# Patient Record
Sex: Female | Born: 1935
Health system: Southern US, Community
[De-identification: ages and names within clinical notes are randomized; demographics above are authoritative.]

## PROBLEM LIST (undated history)

## (undated) DIAGNOSIS — Z95810 Presence of automatic (implantable) cardiac defibrillator: Secondary | ICD-10-CM

## (undated) DIAGNOSIS — I495 Sick sinus syndrome: Secondary | ICD-10-CM

## (undated) DIAGNOSIS — G63 Polyneuropathy in diseases classified elsewhere: Secondary | ICD-10-CM

## (undated) DIAGNOSIS — R269 Unspecified abnormalities of gait and mobility: Secondary | ICD-10-CM

## (undated) DIAGNOSIS — I872 Venous insufficiency (chronic) (peripheral): Secondary | ICD-10-CM

## (undated) DIAGNOSIS — E785 Hyperlipidemia, unspecified: Secondary | ICD-10-CM

## (undated) DIAGNOSIS — I10 Essential (primary) hypertension: Secondary | ICD-10-CM

## (undated) DIAGNOSIS — H3530 Unspecified macular degeneration: Secondary | ICD-10-CM

## (undated) DIAGNOSIS — B029 Zoster without complications: Secondary | ICD-10-CM

## (undated) DIAGNOSIS — I2510 Atherosclerotic heart disease of native coronary artery without angina pectoris: Secondary | ICD-10-CM

## (undated) DIAGNOSIS — Z950 Presence of cardiac pacemaker: Secondary | ICD-10-CM

## (undated) HISTORY — PX: COLONOSCOPY W/ POLYPECTOMY: SHX1380

## (undated) HISTORY — PX: KIDNEY STONE SURGERY: SHX686

## (undated) HISTORY — DX: Unspecified abnormalities of gait and mobility: R26.9

## (undated) HISTORY — DX: Unspecified macular degeneration: H35.30

## (undated) HISTORY — PX: ABDOMINAL HYSTERECTOMY: SHX81

## (undated) HISTORY — DX: Atherosclerotic heart disease of native coronary artery without angina pectoris: I25.10

## (undated) HISTORY — DX: Zoster without complications: B02.9

## (undated) HISTORY — DX: Venous insufficiency (chronic) (peripheral): I87.2

## (undated) HISTORY — PX: ROTATOR CUFF REPAIR: SHX139

## (undated) HISTORY — DX: Polyneuropathy in diseases classified elsewhere: G63

## (undated) NOTE — *Deleted (*Deleted)
Pt' son Rocky Link called and advised that pt has macular degeneration and has lost most of her central vision. Pt will need to be fed due to unable to see food on tray. States pt has been loosing weight at the ALF where she resides due to inability to feed self.  Also advises that pt is a memory care patient in an ALF and usually can carry on a good conversation but obviously has memory issues, thinks her home is in old [place] in [place].

---

## 2001-06-30 HISTORY — PX: CARDIAC CATHETERIZATION: SHX172

## 2001-07-16 ENCOUNTER — Inpatient Hospital Stay (HOSPITAL_COMMUNITY): Admission: AD | Admit: 2001-07-16 | Discharge: 2001-07-18 | Payer: Self-pay | Admitting: *Deleted

## 2001-10-03 ENCOUNTER — Ambulatory Visit (HOSPITAL_COMMUNITY): Admission: RE | Admit: 2001-10-03 | Discharge: 2001-10-03 | Payer: Self-pay | Admitting: Family Medicine

## 2001-10-03 ENCOUNTER — Encounter: Payer: Self-pay | Admitting: Family Medicine

## 2001-10-22 ENCOUNTER — Encounter: Payer: Self-pay | Admitting: Family Medicine

## 2001-10-22 ENCOUNTER — Ambulatory Visit (HOSPITAL_COMMUNITY): Admission: RE | Admit: 2001-10-22 | Discharge: 2001-10-22 | Payer: Self-pay | Admitting: Family Medicine

## 2001-10-28 ENCOUNTER — Encounter: Payer: Self-pay | Admitting: Family Medicine

## 2001-10-28 ENCOUNTER — Ambulatory Visit (HOSPITAL_COMMUNITY): Admission: RE | Admit: 2001-10-28 | Discharge: 2001-10-28 | Payer: Self-pay | Admitting: Family Medicine

## 2001-11-07 ENCOUNTER — Ambulatory Visit (HOSPITAL_COMMUNITY): Admission: RE | Admit: 2001-11-07 | Discharge: 2001-11-08 | Payer: Self-pay | Admitting: *Deleted

## 2001-11-08 ENCOUNTER — Encounter: Payer: Self-pay | Admitting: Family Medicine

## 2001-11-20 ENCOUNTER — Encounter: Payer: Self-pay | Admitting: Unknown Physician Specialty

## 2001-11-20 ENCOUNTER — Ambulatory Visit (HOSPITAL_COMMUNITY): Admission: RE | Admit: 2001-11-20 | Discharge: 2001-11-20 | Payer: Self-pay | Admitting: Unknown Physician Specialty

## 2001-11-29 ENCOUNTER — Ambulatory Visit (HOSPITAL_COMMUNITY): Admission: RE | Admit: 2001-11-29 | Discharge: 2001-11-29 | Payer: Self-pay | Admitting: Unknown Physician Specialty

## 2001-11-29 ENCOUNTER — Encounter: Payer: Self-pay | Admitting: Unknown Physician Specialty

## 2001-12-06 ENCOUNTER — Ambulatory Visit (HOSPITAL_COMMUNITY): Admission: RE | Admit: 2001-12-06 | Discharge: 2001-12-06 | Payer: Self-pay | Admitting: Rheumatology

## 2001-12-09 ENCOUNTER — Ambulatory Visit (HOSPITAL_COMMUNITY): Admission: RE | Admit: 2001-12-09 | Discharge: 2001-12-09 | Payer: Self-pay | Admitting: Unknown Physician Specialty

## 2001-12-09 ENCOUNTER — Encounter: Payer: Self-pay | Admitting: Unknown Physician Specialty

## 2001-12-30 ENCOUNTER — Ambulatory Visit (HOSPITAL_COMMUNITY): Admission: RE | Admit: 2001-12-30 | Discharge: 2001-12-30 | Payer: Self-pay | Admitting: Interventional Radiology

## 2002-09-30 ENCOUNTER — Inpatient Hospital Stay (HOSPITAL_COMMUNITY): Admission: AD | Admit: 2002-09-30 | Discharge: 2002-10-02 | Payer: Self-pay | Admitting: Cardiology

## 2002-10-22 ENCOUNTER — Ambulatory Visit (HOSPITAL_COMMUNITY): Admission: RE | Admit: 2002-10-22 | Discharge: 2002-10-22 | Payer: Self-pay | Admitting: Family Medicine

## 2002-10-23 ENCOUNTER — Encounter: Payer: Self-pay | Admitting: Family Medicine

## 2003-02-05 ENCOUNTER — Ambulatory Visit (HOSPITAL_COMMUNITY): Admission: RE | Admit: 2003-02-05 | Discharge: 2003-02-05 | Payer: Self-pay | Admitting: Interventional Radiology

## 2004-07-31 HISTORY — PX: CARDIAC CATHETERIZATION: SHX172

## 2004-12-17 ENCOUNTER — Ambulatory Visit (HOSPITAL_COMMUNITY): Admission: RE | Admit: 2004-12-17 | Discharge: 2004-12-17 | Payer: Self-pay | Admitting: Family Medicine

## 2005-01-24 ENCOUNTER — Encounter (HOSPITAL_COMMUNITY): Admission: RE | Admit: 2005-01-24 | Discharge: 2005-02-23 | Payer: Self-pay | Admitting: Orthopedic Surgery

## 2005-02-28 ENCOUNTER — Encounter (HOSPITAL_COMMUNITY): Admission: RE | Admit: 2005-02-28 | Discharge: 2005-03-30 | Payer: Self-pay | Admitting: Orthopedic Surgery

## 2005-03-31 ENCOUNTER — Encounter (HOSPITAL_COMMUNITY): Admission: RE | Admit: 2005-03-31 | Discharge: 2005-04-29 | Payer: Self-pay | Admitting: Orthopedic Surgery

## 2007-03-07 ENCOUNTER — Ambulatory Visit: Payer: Self-pay | Admitting: Ophthalmology

## 2007-03-07 ENCOUNTER — Other Ambulatory Visit: Payer: Self-pay

## 2007-03-13 ENCOUNTER — Ambulatory Visit: Payer: Self-pay | Admitting: Ophthalmology

## 2008-05-29 ENCOUNTER — Ambulatory Visit (HOSPITAL_COMMUNITY): Admission: RE | Admit: 2008-05-29 | Discharge: 2008-05-29 | Payer: Self-pay | Admitting: *Deleted

## 2010-08-25 HISTORY — PX: RADIOFREQUENCY ABLATION: SHX5911

## 2010-12-16 NOTE — Cardiovascular Report (Signed)
[place]  Patient:    Pamela [name], PINSON Visit Number: [id number] MRN: [id number]          Service Type: CAT Location: 3700 [id number] Attending Physician:  Darlin Priestly Dictated by:   Lenise Herald, M.D. Proc. Date: 07/16/01 Admit Date:  07/15/2001   CC:         Sherral Hammers, M.D.   Cardiac Catheterization  PROCEDURES:  Coronary angiogram.  ATTENDING:  Lenise Herald, M.D.  COMPLICATIONS:  None.  INDICATIONS:  Ms. Dimperio is a 77 year old female patient of Dr. Kem Boroughs admitted on July 15, 2001, for angioplasty of the proximal LAD. The patient underwent successful and uncomplicated cutting balloon angioplasty with stenting of the proximal LAD using a BioDivYsio 2.5 x 10 mm stent which was post dilated using a 2.75 x 10 CrossSail.  The patient had intermittent chest pain throughout the night with no significant EKG changes and is now brought back for relook at the LAD stent.  DESCRIPTION OF OPERATION:  After giving informed consent, the patient was brought to the cardiac catheterization lab where her left groin was shaved, prepped and draped in the usual sterile fashion.  Using modified Seldinger technique, a 6-French arterial sheath was inserted in the left femoral artery. A 6-French diagnostic catheter was used to perform diagnostic angiography.  This revealed a large left main with no significant disease.  The LAD is a medium size vessel which trifurcates in its mid segment into a large septal branch, continues as the LAD and large diagonal branch, all three of which run toward the apex.  There is a stent noted in the proximal LAD which is widely patent with no evidence of significant flow restriction or thrombus.  There is 20% ostial lesion.  There is a 70% ostial lesion in the takeoff of the first septal perforator and 50% lesion in the proximal portion of a small diagonal.  The left coronary artery also gives rise  to a ramus intermedius with 50% proximal stenosis.  The A-V groove circumflex has no significant disease.  HEMODYNAMICS:  Systemic arterial pressure 154/70.  CONCLUSIONS:  Widely patent left anterior descending artery stent with noncritical disease of the left anterior descending artery and diagonal. There is a 70% ostial septal perforator which courses to the apex. Dictated by:   Lenise Herald, M.D. Attending Physician:  Darlin Priestly DD:  07/16/01 TD:  07/16/01 Job: [id number]

## 2010-12-16 NOTE — Discharge Summary (Signed)
   NAMEJANARA, Pamela [name]                          ACCOUNT NO.:  [id number]   MEDICAL RECORD NO.:  [id number]                   PATIENT TYPE:  INP   LOCATION:  2005                                 FACILITY:  [facility]   PHYSICIAN:  Abelino Derrick, P.A.                DATE OF BIRTH:  08-Nov-1920   DATE OF ADMISSION:  09/30/2002  DATE OF DISCHARGE:  10/02/2002                                 DISCHARGE SUMMARY   ADDENDUM:  The patient is unable to tolerate IMDUR because of headache.                                               Abelino Derrick, P.Memory Dance  D:  10/02/2002  T:  10/03/2002  Job:  [id number]

## 2010-12-16 NOTE — H&P (Signed)
Pamela [name], BIGGERS                          ACCOUNT NO.:  [id number]   MEDICAL RECORD NO.:  [id number]                   PATIENT TYPE:  OIB   LOCATION:  2899                                 FACILITY:  [facility]   PHYSICIAN:  Kem Boroughs, M.D.                 DATE OF BIRTH:  1920/10/03   DATE OF ADMISSION:  09/30/2002  DATE OF DISCHARGE:                                HISTORY & PHYSICAL   REFERRING PHYSICIAN:  Kirk Ruths, M.D.   HISTORY OF PRESENT ILLNESS:  The patient is a 77 year old white female with  a past medical history of coronary artery disease and hyperlipidemia who  underwent cardiac catheterization with stent placement to the mid LAD in  December 2002.  The left main coronary artery contained a distal 20%  stenosis, while the LAD contained an eccentric 80% stenosis  ostially/proximally which was subsequently stented with a 2.5 x 10  BiodivYsio by Dr. Lenise Herald.  Additionally, she had a 50%-60% stenosis  at the takeoff of the second septal perforator.  The left circumflex was  without significant disease.  The ramus intermedius branch contained a  proximal 30%-40% stenosis while the RCA contained luminal irregularities but  no significant disease.  Left ventricular systolic function was normal at  that time.   The patient had done quite well since that time.  She had been maintained on  appropriate cardiac medications.  She was quite compliant with these  medications.  She never had true chest discomfort; instead, she experienced  shortness of breath and fatigue prompting a nuclear stress test, which was  abnormal.  This led to her above-noted cardiac catheterization.   The patient returns today as a walk in.  She reports new onset chest  discomfort located to the mid substernal region without radiation to any  other part of her body.  It was associated with nausea, possibly some  shortness of breath, but no vomiting and no diaphoresis.  It has exhibited  a  stuttering pattern throughout the morning.  It is nitroglycerin  responsive.  She has taken five nitroglycerin this morning, all with interim  resolution of her chest discomfort, only to recur in 15-20 minutes.   PAST MEDICAL HISTORY:  1. Coronary artery disease.  2. Hyperlipidemia on Lipitor.   ALLERGIES:  None known.   CURRENT MEDICATIONS:  1. Aspirin 325 a day.  2. Nitroglycerin sublingual p.r.n.  3. Lipitor 10 daily.  4. Darvocet p.r.n.  5. Nexium 40 mg daily.  6. Altace 2.5 daily.  7. Maxzide p.r.n.  8. Advil p.r.n.   SOCIAL HISTORY:  She lives alone.  She does not smoke.  She is quite active.   FAMILY HISTORY:  Notable for coronary artery disease.   REVIEW OF SYSTEMS:  GENERAL:  Negative.  EYES:  She does wear reading  glasses.  ENT:  Negative.  RESPIRATORY:  Shortness of breath associated with  this chest discomfort.  GI:  Negative.  GU:  Negative.  NEUROLOGIC:  Negative.  PSYCHIATRIC:  Negative.  ENDOCRINE:  Notable for hyperlipidemia.   PHYSICAL EXAMINATION:  GENERAL:  A very pleasant, anxious, elderly white  female in no acute distress.  Alert and oriented x3.  VITAL SIGNS:  Height 5 feet 6-1/2 inches tall, weight is 156 pounds.  Blood  pressure is 130/80, heart rate is 58.  NECK:  Negative for JVD or bruits.  LUNGS:  Clear to auscultation bilaterally.  CARDIAC:  Regular rate and rhythm without murmurs, gallops, or rubs  appreciated.  ABDOMEN:  Benign with positive bowel sounds.  EXTREMITIES:  Negative for edema.   LABORATORY DATA:  An electrocardiogram performed in the office today reveals  sinus bradycardia with minimal ST depression anteriorly and inferiorly,  which is unchanged from prior electrocardiogram.  Otherwise, nonspecific ST  changes.   IMPRESSION:  1. Coronary artery disease.  2. Chest pain.  3. Hyperlipidemia.   RECOMMENDATIONS:  1. Admit to [facility] PCU/CCU, given the fact that she was continuing to     have chest discomfort in the  office which was nitroglycerin responsive.  2. Serial cardiac enzymes.  3. Continue aggressive medical therapy.  4. Intravenous heparin and intravenous nitroglycerin.  5. Plan for cardiac catheterization today or tomorrow.                                               Kem Boroughs, M.D.    TB/MEDQ  D:  09/30/2002  T:  09/30/2002  Job:  [id number]   cc:   Kirk Ruths, M.D.  [address]  [place]  [place] [address]  Fax: [phone]   [facility] [place]

## 2010-12-16 NOTE — Cardiovascular Report (Signed)
[place]  Patient:    LINDA, GRIMMER Visit Number: [id number] MRN: [id number]          Service Type: CAT Location: 3700 3709 [id number] Attending Physician:  Darlin Priestly Dictated by:   Darlin Priestly, M.D. Proc. Date: 07/15/01 Admit Date:  07/15/2001   CC:         Sherral Hammers, M.D.   Cardiac Catheterization  PROCEDURES: 1. Coronary angiography. 2. Left anterior descending - proximal.    a. Cutting Balloon athrectomy.    b. Percutaneous transluminal coronary balloon angioplasty.    c. Placement of intracoronary stent.  COMPLICATIONS: None.  INDICATIONS: The patient is a 77 year old female, patient of Dr. Kem Boroughs, with a history of increasing chest pain and shortness of breath. She did have a Cardiolite scan revealing anteroseptal and anterior ischemia with a normal EF. She underwent cardiac catheterization by Dr. Domingo Sep on July 11, 2001, revealing significant proximal LAD disease. It was felt at that time that her LAD, though it was a medium sized vessel in its proximal portion, that it then trifurcated into three small branches which were not felt to be amenable for bypass because of their small size. She is now brought for percutaneous intervention.  DESCRIPTION OF PROCEDURE: After given informed written consent, the patient was brought to the cardiac catheterization lab where her right and left groins were shaved, prepped, and draped in the usual sterile fashion.  ECG monitoring was established.  Using modified Seldinger technique, a #7 French arterial sheath was inserted in the right femoral artery.  Next, a #7 [place] guiding catheter was coaxially engaged in the left coronary ostium and selective angiogram was then performed. This revealed a large left main with no significant disease.  The LAD as previously stated was a medium sized vessel in its proximal segment with up to 70% proximal disease. This  bifurcated in the early mid segment into three small branches, all of which were approximately at 1.5 to 2 mm vessels.  The left coronary os gave rise to a large ramus intermedius which had no significant disease and the circumflex was a small vessel with no significant disease.  INTERVENTIONAL PROCEDURE: Following the angiogram a 0.014 exchange length Patriot guide wire was advanced out of the guiding catheter into the proximal LAD. This was then positioned within the distal diagonal without difficulty. Next, a 2.5 x 10 mm Cutting Balloon was then tracked across the proximal lesion. Two subsequent inflations to a maximum of 6 atmospheres was then performed for a total of 1 minute and 27 seconds. Followup angiogram revealed excellent luminal gain with persistent irregularities. We then removed this balloon and a second short Patriot guide wire was advanced out the guide catheter and positioned in the distal ramus without difficulty. Next, a BiodivYsio 2.5 x 10 mm stent was then positioned in the proximal LAD with careful attention not to extend this into the left main. This stent was then deployed to a maximum of 14 atmospheres for a total of 51 seconds. Followup angiogram revealed no evidence of dissection or thrombus with TIMI-3 flow to the distal vessel. This balloon was then removed and a CrossSail 2.75 x 10 mm balloon was then positioned into the distal aspect of the stent. This was then inflated to a maximum of 8 atmospheres for a total of 42 seconds to flare the distal portion of the stent. This was then pulled back within the stent and was reinflated at 8 atmospheres  for a total of 33 seconds. Followup angiograms revealed no evidence of dissection or thrombus with TIMI-3 flow to the distal vessel. IV Integrilin was given. Intravenous doses of heparin were given to maintain the ACT between 200 and 300.  Final orthogonal angiograms reveal less than 30% residual stenosis in  the proximal LAD with no evidence of dissection or thrombus. At this point, we elected to conclude the procedure. All balloons, wires and catheters were removed. Hemostatic sheaths were swen in place and the patient was transferred back to the ward in stable condition.  CONCLUSIONS: 1. Successful Cutting Balloon athrectomy with adjunctive percutaneous    transluminal coronary balloon angioplasty and placement of a BiodivYsio    2.5 x 10 mm stent ultimately dilated to 2.75 mm in the proximal left    anterior descending stenotic lesion. 2. Systemic hypertension. 3. Adjunct use of Integrilin infusion. Dictated by:   Darlin Priestly, M.D. Attending Physician:  Darlin Priestly DD:  07/15/01 TD:  07/16/01 Job: [id number]

## 2010-12-16 NOTE — Discharge Summary (Signed)
[place]  Patient:    Pamela [name], Pamela [name] Visit Number: [id number] MRN: [id number]          Service Type: MED Location: 3700 3733 01 Attending Physician:  Darlin Priestly Dictated by:   Raymon Mutton, P.A. Admit Date:  07/15/2001 Discharge Date: 07/18/2001   CC:         Dr. Linus Galas, M.D.   Discharge Summary  ATTENDING PHYSICIAN:  Lenise Herald, M.D.  DATE OF BIRTH:  2021/06/13.  DISCHARGE DIAGNOSES: 1. Coronary artery disease, status post coronary angiography on 07/16/01 with    no intervention at that time. 2. Status post coronary angiography on 07/15/01 with percutaneous transluminal    coronary angioplasty and stent to left anterior descending. 3. Hyperlipidemia. 4. Positive family history of coronary artery disease.  HISTORY OF PRESENT ILLNESS:  The patient is a 77 year old Caucasian woman who was seen by Dr. Laurena Slimmer in the office and Dr. Ewing Schlein on 06/13/01 and at that time was suspected to have coronary artery disease and was scheduled for diagnostic catheterization at [facility] on 07/11/01.  The patient underwent the diagnostic procedure and was found to have significant coronary artery disease involving the left anterior descending artery, otherwise nonobstructive coronary artery disease and normal left ventricular systolic function.  The patient was scheduled subsequently for intervention on 07/15/01 and during that time she underwent successful percutaneous transluminal coronary angioplasty intervention with stent placed to the proximal LAD.  The patient tolerated the procedure well and stayed overnight in the hospital.  During that night she developed chest pain which was on and off through the entire night but no significant electrocardiogram changes. She was brought back to the catheterization to re-look at the LAD stent for possible closure of that segment of the stent and this procedure  revealed a widely patent stent in the proximal LAD with no evidence of significant flow restriction or thrombus.  There was 20% ostial stenosis and 70% of ostial stenosis of the first septal perforator, 50% lesion of the proximal diagonal. The patient tolerated this second catheterization with no complications and remained stable through the entire admission.  HOSPITAL PROCEDURES: 1. Cardiac catheterization 07/15/01 performed by Dr. Jenne Campus. 2. Relook procedure 07/16/01, coronary angiography with no intervention    showing patent stent.  COMPLICATIONS:  None.  LABORATORY VALUES AND STUDIES:  CBC showed white blood cell count 5.2, hemoglobin 10.4, hematocrit 30.3 and platelet count 149K.  BMP:  Sodium 143, potassium 4.2, creatinine 0.8 and BUN 14, calcium 8.7, CK 96, CK-MB 3.0.  ELECTROCARDIOGRAM: Normal sinus rhythm, depression of ST-T waves, Q wave depression in lead 1, in aVL, inversion of T wave in leads V4 through V6.  DISCHARGE MEDICATIONS: 1. Enteric coated aspirin 325 mg q.d. 2. Plavix 75 mg q.d. for one month. 3. Lipitor 10 mg q.h.s. 4. Nexium 40 mg q.d. 5. Norvasc 5 mg daily. 6. Nitroglycerin 4 mg one under tongue as needed for chest pain.  DISCHARGE ACTIVITY:  No strenuous physical activity for three days following the catheterization and then resume regular activity with no driving for 72 hours.  DISCHARGE DIET:  Low fat, low salt, low cholesterol diet.  WOUND CARE:  Right catheterization groin site may be washed gently with mild soap, do not wrap, pat dry and call if any bleeding, oozing, redness, swelling or increased pain observed.  DISCHARGE FOLLOW UP:  Follow up with Dr. Laurena Slimmer in two to three weeks. Dictated by:   Raymon Mutton, P.A. Attending  Physician:  Darlin Priestly DD:  07/18/01 TD:  07/19/01 Job: [id number]

## 2010-12-16 NOTE — Discharge Summary (Signed)
[name], Pamela [name]                          ACCOUNT NO.:  [id number]   MEDICAL RECORD NO.:  [id number]                   PATIENT TYPE:  INP   LOCATION:  2005                                 FACILITY:  [facility]   PHYSICIAN:  Kem Boroughs, M.D.                 DATE OF BIRTH:  02/27/1921   DATE OF ADMISSION:  09/30/2002  DATE OF DISCHARGE:  10/02/2002                                 DISCHARGE SUMMARY   DISCHARGE DIAGNOSES:  1. Chest pain consistent with unstable angina.  2. Coronary disease with history of left anterior descending cutting balloon     and stent placement July 15, 2001, left anterior descending site     patent by catheterization this admission with 80 to 90% septal     perforator.  3. Preserved left ventricular function.  4. Treated hypertension.  5. Treated hyperlipidemia.   HOSPITAL COURSE:  The patient is a 77 year old female followed by Dr.  Regino Schultze and Dr. Domingo Sep.  She was seen by Dr. Domingo Sep September 30, 2002, with  recurrent chest pain consistent with unstable angina.  She was transferred  to [facility]. [facility], she was initially seen at [facility].  The patient was admitted to telemetry, started on low dose  calcium blocker and nitrates and set up for diagnostic catheterization.  CK-  MB and troponin  were negative.  Catheterization was done September 30, 2002, by  Dr. Jacinto Halim.  This revealed a 20% RCA, 20% ramus, 20% circumflex, patent LAD  stent site with an 80 to 90% septal perforator and a 50% first diagonal.  This was essentially unchanged from previous intervention.  We added Vioxx  for musculoskeletal component.  We feel she can be discharged October 02, 2002.  Dr. Tresa Endo would like her to have an outpatient Cardiolite study and then  follow up with Dr. Domingo Sep.   DISCHARGE MEDICATIONS:  1. Aspirin 325 mg a day.  2. Vioxx 25 mg a day x5 days.  3. Lipitor 40 mg a day.  4. Altace 2.5 mg a day.  5. Norvasc 2.5 mg a day.  6. Nexium 40  mg a day.  7. Imdur 30 mg a day.  8. Nitroglycerin sublingual p.r.n.   LABORATORY DATA:  Lipid profile shows cholesterol 166, HDL 71, LDL 79.  CK-  MB and troponin are negative.  Sodium 139, potassium 3.7, BUN 15, creatinine  0.8.  White count 4.3, hemoglobin 10.8, hematocrit 31.4, platelets 185.  INR  1.0.  Liver functions were normal.   EKG revealed sinus rhythm, sinus bradycardia without acute changes.    DISPOSITION:  The patient is discharged in stable condition and will have a  Cardiolite study October 15, 2002, and then see Dr. Domingo Sep October 17, 2002,  in [place], [place].     Abelino Derrick, P.A.  Kem Boroughs, M.D.    Lenard Lance  D:  10/02/2002  T:  10/03/2002  Job:  [id number]   cc:   Kem Boroughs, M.D.  [phone] [address]  [place]  [place] [address]  Fax: [phone]   Kirk Ruths, M.D.  [address]  [place]  [place] [address]  Fax: [phone]

## 2010-12-16 NOTE — Cardiovascular Report (Signed)
Pamela [name], Pamela [name]                          ACCOUNT NO.:  [id number]   MEDICAL RECORD NO.:  [id number]                   PATIENT TYPE:  OIB   LOCATION:  2899                                 FACILITY:  [facility]   PHYSICIAN:  Cristy Hilts. Jacinto Halim, M.D.                  DATE OF BIRTH:  August 01, 1920   DATE OF PROCEDURE:  09/30/2002  DATE OF DISCHARGE:                              CARDIAC CATHETERIZATION   REFERRING PHYSICIAN:  Dr. Dionne Ano. Regino Schultze.   PROCEDURE PERFORMED:  1. Left ventriculography.  2. Selective right and left coronary arteriography.   INDICATION:  The patient is a 77 year old active female with a history of  coronary artery disease, status post PTCA and stenting of the proximal left  anterior descending artery performed on July 15, 2001 by Dr. Darlin Priestly for unstable angina, who presents with chest pain to the outpatient  setting.  She had taken four sublingual nitroglycerin and she continued to  have recurrent chest pain.  Given chest pain at rest with diagnosis of  unstable angina, she was brought directly to the cardiac catheterization lab  to evaluate her coronary anatomy.   HEMODYNAMIC DATA:  The left ventricular pressure was 156/4 with an end  diastolic pressure of 8 mmHg.  The aortic pressure was 158/72 with a mean of  108 mmHg.  There was no pressure gradient across the aortic valve.   ANGIOGRAPHIC DATA:  Left ventricle:  The left ventricular systolic function  was normal.  The ejection fraction was estimated at 55-60%.  There was no mitral regurgitation.   Right coronary artery:  The right coronary artery is a very large-caliber  vessel and a dominant vessel.  It has got mild diffuse luminal irregularity.  It gives origin to a very large PDA.   Left main coronary artery:  The left main coronary artery is a very large-  caliber vessel.  It has got mild calcification.  It trifurcates.   Left anterior descending artery:  The left anterior descending  artery is a  moderate-to-small-caliber vessel (2.5 mm).  The ostium has about 30%  stenosis.  The previously placed proximal LAD stent is widely patent.  The  LAD in the midsegment trifurcates into a large septal perforator which has  80-90% ostial stenosis, a small-sized LAD and a small-sized diagonal which  has proximal 50% stenosis.  All coursed towards the apex.   Ramus intermediate:  The ramus intermediate artery is a very large-caliber  vessel and larger than the LAD.  It has got proximal 20% luminal  irregularity.   Circumflex:  The circumflex artery is a large-caliber vessel.  It has mild  luminal irregularities constituting 20% stenosis.   IMPRESSIONS:  1. Normal left ventricular systolic function, ejection fraction 55-60%.  2. Widely patent proximal left anterior descending stent that is a 2.5 x     10.0-mm Bio[employer] Ysio stent that was  placed on July 15, 2001.  It is     widely patent with residual ostial 30% stenosis unchanged from cardiac     catheterization on July 16, 2001.  3. Septal perforator 80-90% ostial diagonal 1, 50% proximal stenosis, again     unchanged from prior cardiac catheterization.  Small vessels (1.5- to 2.0-     mm vessels).  4. Mild disease of a large right coronary artery.   RECOMMENDATION:  The patient will be admitted with a diagnosis of unstable  angina and CPKs will be checked.  Aggressively good management is indicated.  Outpatient exercise Cardiolite stress test is indicated if she remains chest-  pain-free.  If the CPKs are negative, evaluation for noncardiac causes of  chest pain is indicated.  We will increase her Nexium to 40 mg p.o. b.i.d.  We will add Plavix for now at 75 mg p.o. daily.   TECHNIQUE OF PROCEDURE:  Under the usual sterile precautions, using a 6-  French right femoral artery access, a 6-French multipurpose PD catheter was  advanced into the ascending aorta over a _________ J wire.  The catheter was  gently advanced  to the left ventricle.  Left ventricular pressures were  monitored.  Hand contrast injections of the left ventricle were performed  both in the LAO and RAO projections.  The catheter was then pulled back into  the ascending aorta and pressure gradient across the aortic valve was  monitored.  The right coronary artery was selectively engaged and  angiography was performed.  In a similar fashion, the left coronary artery  was selectively engaged and angiography was performed.  Then the catheter  was pulled out of the body.  The patient tolerated the procedure well.                                               Cristy Hilts. Jacinto Halim, M.D.    Pilar Plate  D:  09/30/2002  T:  10/01/2002  Job:  [id number]   cc:   Kirk Ruths, M.D.  [address]  [place]  [place] [address]  Fax: [phone]   Kem Boroughs, M.D.  [address]  [place]  [place] [address]  Fax: [phone]

## 2010-12-27 ENCOUNTER — Other Ambulatory Visit: Payer: Self-pay | Admitting: Obstetrics and Gynecology

## 2010-12-27 DIAGNOSIS — Z139 Encounter for screening, unspecified: Secondary | ICD-10-CM

## 2010-12-29 ENCOUNTER — Ambulatory Visit (HOSPITAL_COMMUNITY)
Admission: RE | Admit: 2010-12-29 | Discharge: 2010-12-29 | Disposition: A | Discharge status: Home / Self Care | Payer: Medicare Other | Source: Ambulatory Visit | Attending: Obstetrics and Gynecology | Admitting: Obstetrics and Gynecology

## 2010-12-29 DIAGNOSIS — Z1231 Encounter for screening mammogram for malignant neoplasm of breast: Secondary | ICD-10-CM | POA: Insufficient documentation

## 2010-12-29 DIAGNOSIS — Z139 Encounter for screening, unspecified: Secondary | ICD-10-CM

## 2011-11-20 ENCOUNTER — Ambulatory Visit (HOSPITAL_COMMUNITY)
Admission: RE | Admit: 2011-11-20 | Discharge: 2011-11-20 | Disposition: A | Discharge status: Home / Self Care | Payer: Medicare Other | Source: Ambulatory Visit | Attending: Orthopedic Surgery | Admitting: Orthopedic Surgery

## 2011-11-20 DIAGNOSIS — IMO0001 Reserved for inherently not codable concepts without codable children: Secondary | ICD-10-CM | POA: Insufficient documentation

## 2011-11-20 DIAGNOSIS — Z9889 Other specified postprocedural states: Secondary | ICD-10-CM | POA: Insufficient documentation

## 2011-11-20 DIAGNOSIS — M6281 Muscle weakness (generalized): Secondary | ICD-10-CM | POA: Insufficient documentation

## 2011-11-20 DIAGNOSIS — M25619 Stiffness of unspecified shoulder, not elsewhere classified: Secondary | ICD-10-CM | POA: Insufficient documentation

## 2011-11-20 DIAGNOSIS — M25519 Pain in unspecified shoulder: Secondary | ICD-10-CM | POA: Insufficient documentation

## 2011-11-20 NOTE — Evaluation (Signed)
Occupational Therapy Evaluation  Patient Details  Name: Pamela [name] MRN: [id number] Date of Birth: 27-Nov-1920  Today's Date: 11/20/2011 Time: 1101-1200 Time Calculation (min): 59 min OT Evaluation 1101-1116 15' Manual Therapy 5621-3086 20' Ice 1140-1200 20' Visit#: 1  of 36   Re-eval: 12/18/11  Assessment Diagnosis: S/P R RCR Surgical Date: 10/31/11 Next MD Visit: 11/28/11 Prior Therapy: [facility]   Past Medical History: No past medical history on file. Past Surgical History: No past surgical history on file.  Subjective Symptoms/Limitations Symptoms: S:  I want to get back to the way I was.  I want a complete recovery. Limitations: History:  Pamela [name] states that she has been experiencing pain in her right shoulder since October 2012.  She consulted with Dr. Chaney Malling, and had surgery to repair her torn right rotator cuff on 10/31/11.  She has been referred to occupational therapy for evaluation and treatment following Dr. Andreas Blower protocol.  She may begin AAROM on 12/12/11.   Special Tests: UEFI is 4/80 = 5% Pain Assessment Currently in Pain?: Yes Pain Score:   5 Pain Location: Shoulder Pain Orientation: Right Pain Type: Acute pain  Precautions/Restrictions  Precautions Precautions: Shoulder Precaution Comments: PROM only x 6 weeks  Prior Functioning  Home Living Additional Comments: lives alone has an aide 3 hours a week Prior Function Comments: enjoys water aerobics  Assessment ADL/Vision/Perception ADL ADL Comments: rR handed.  Requires assistance with dressing, bathing, cooking, driving.  She is not using her dominant right hand with any functional activities.  Cognition/Observation    Sensation/Coordination/Edema    Additional Assessments RUE PROM (degrees) RUE Overall PROM Comments: PROM assessed in supine.  ER and IR with shoulder adducted Right Shoulder Flexion  0-170: 73 Degrees Right Shoulder ABduction 0-140: 55 Degrees Right Shoulder  Internal Rotation  0-70: 45 Degrees Right Shoulder External Rotation  0-90: 25 Degrees Palpation Palpation: moderate fascial restrictions noted in her right shoulder region.  2.5 inch incision over superior shoulder.  Exercise/Treatments Supine Protraction: PROM;10 reps Horizontal ABduction: PROM;10 reps External Rotation: PROM;10 reps Internal Rotation: PROM;10 reps Flexion: PROM;10 reps ABduction: PROM;10 reps      Modalities Modalities: Cryotherapy Manual Therapy Manual Therapy: Myofascial release Myofascial Release: MFR and manual stretching to right upper arm, scapular, and shoulder region to decrease fascial restrictions and increase PROM to [facility] in pain free range.  [id number]  Cryotherapy Number Minutes Cryotherapy: 20 Minutes Cryotherapy Location: Shoulder Type of Cryotherapy: Ice pack  Occupational Therapy Assessment and Plan OT Assessment and Plan Clinical Impression Statement: A: Patient presents with decreased AROM and strength and increased pain and restrictions s/p right rotator cuff repair.  Patient unable to use dominant right hand with functional activitesi. Rehab Potential: Excellent OT Frequency: Min 3X/week OT Duration: Other (comment) (12 weeks) OT Treatment/Interventions: Self-care/ADL training;Therapeutic exercise;Manual therapy;Therapeutic activities;Patient/family education OT Plan: P:  SKilled OT intervention to decrease pain and restrictions and increase AROM and strength per protocol.  Patient PROM only until 12/12/11.  Tx Plan:  MFR and manual stretching in supine.  Therapeutic Exercises:  PROM in supine.  bridges, isometric strengthening in supine.  seated ext, elev, row.  therapy ball flexion and abduction.     Goals Short Term Goals Time to Complete Short Term Goals: Other (comment) (6 weeks) Short Term Goal 1: Patient will be educated on a HEP. Short Term Goal 2: Pateint will increase PROM to [facility] for increased ability to dress and bathe  herself. Short Term Goal 3: Patient will increase right shoulder strength  to 3+/5 for increased ability to lift groceries. Short Term Goal 4: Patient will decrease pain to 4/10 when dressing and bathing. Short Term Goal 5: Patient will decrease fascial restrictions to moderate in right shoulder region. Long Term Goals Time to Complete Long Term Goals: 12 weeks Long Term Goal 1: Patient will return to full activity with all B/IADLs, leisure, and driving. Long Term Goal 2: Patient will increase right shoulder AROM to WNL for increased ability to water aerobics. Long Term Goal 3: Patient will increase right shoulder strength to 5/5 for increased ability to lift pots and pans when cooking. Long Term Goal 4: Patient will decrease fascial restrictions to mininal in her right shoulder. Long Term Goal 5: Patient will decrease pain in her right shoulder to 2/10 when completing daily activities.  Problem List Patient Active Problem List  Diagnoses  . Pain in joint, shoulder region  . Status post complete repair of rotator cuff  . Muscle weakness (generalized)    End of Session Activity Tolerance: Patient tolerated treatment well General Behavior During Session: [facility] for tasks performed Cognition: [facility] for tasks performed   Shirlean Mylar, OTR/L  11/20/2011, 3:07 PM  Physician Documentation Your signature is required to indicate approval of the treatment plan as stated above.  Please sign and either send electronically or make a copy of this report for your files and return this physician signed original.  Please mark one 1.__approve of plan  2. ___approve of plan with the following conditions.   ______________________________                                                          _____________________ Physician Signature                                                                                                             Date

## 2011-11-22 ENCOUNTER — Ambulatory Visit (HOSPITAL_COMMUNITY)
Admission: RE | Admit: 2011-11-22 | Discharge: 2011-11-22 | Disposition: A | Discharge status: Home / Self Care | Payer: Medicare Other | Source: Ambulatory Visit | Attending: Family Medicine | Admitting: Family Medicine

## 2011-11-22 DIAGNOSIS — M6281 Muscle weakness (generalized): Secondary | ICD-10-CM

## 2011-11-22 DIAGNOSIS — Z9889 Other specified postprocedural states: Secondary | ICD-10-CM

## 2011-11-22 DIAGNOSIS — M25519 Pain in unspecified shoulder: Secondary | ICD-10-CM

## 2011-11-22 NOTE — Progress Notes (Addendum)
Occupational Therapy Treatment  Patient Details  Name: JANEECE BLOK MRN: [id number] Date of Birth: 04-Oct-1920  Today's Date: 11/22/2011 Time: 4098-1191 Time Calculation (min): 42 min Manual Therapy 847-913 26' Therapeutic Exercise 478-295 15'   Visit#: 2  of 36   Re-eval: 12/18/11 Assessment Diagnosis: S/P R RCR Surgical Date: 10/31/11 Next MD Visit: 11/28/11  Subjective Symptoms/Limitations Symptoms: S:  My shoulder is good. Pain Assessment Currently in Pain?: No/denies  Precautions/Restrictions  Precautions Precaution Comments: PROM only x 6 weeks  Exercise/Treatments Supine Protraction: PROM;10 reps Horizontal ABduction: PROM;10 reps External Rotation: PROM;10 reps Internal Rotation: PROM;10 reps Flexion: PROM;10 reps ABduction: PROM;10 reps Other Supine Exercises: bridging x10 Seated Elevation: AROM;10 reps Extension: AROM;10 reps Retraction: AROM;10 reps Row: AROM;10 reps Therapy Ball Flexion: 15 reps ABduction: 15 reps ROM / Strengthening / Isometric Strengthening   Flexion: 3X3" Extension: 3X3" External Rotation: 3X3" Internal Rotation: 3X3" ABduction: 3X3" ADduction: 3X3"    Manual Therapy Myofascial Release: MFR and manual stretching to right upper arm, scapular, and shoulder region to decrease fascial restrictions and increase PROM to [facility] in pain free range  Occupational Therapy Assessment and Plan OT Assessment and Plan Clinical Impression Statement: A:  Added isometrics, bridging, ball stretch and elev, ret, row, ext. Rehab Potential: Excellent OT Plan: P:  Cont with PROM till 12/12/11.   Goals Short Term Goals Time to Complete Short Term Goals: Other (comment) (6 weeks) Short Term Goal 1: Patient will be educated on a HEP. Short Term Goal 2: Pateint will increase PROM to [facility] for increased ability to dress and bathe herself. Short Term Goal 3: Patient will increase right shoulder strength to 3+/5 for increased ability to lift  groceries. Short Term Goal 4: Patient will decrease pain to 4/10 when dressing and bathing. Short Term Goal 5: Patient will decrease fascial restrictions to moderate in right shoulder region. Long Term Goals Time to Complete Long Term Goals: 12 weeks Long Term Goal 1: Patient will return to full activity with all B/IADLs, leisure, and driving. Long Term Goal 2: Patient will increase right shoulder AROM to WNL for increased ability to water aerobics. Long Term Goal 3: Patient will increase right shoulder strength to 5/5 for increased ability to lift pots and pans when cooking. Long Term Goal 4: Patient will decrease fascial restrictions to mininal in her right shoulder. Long Term Goal 5: Patient will decrease pain in her right shoulder to 2/10 when completing daily activities.  Problem List Patient Active Problem List  Diagnoses  . Pain in joint, shoulder region  . Status post complete repair of rotator cuff  . Muscle weakness (generalized)    End of Session Activity Tolerance: Patient tolerated treatment well General Behavior During Session: [facility] for tasks performed Cognition: [facility] for tasks performed  GO No functional reporting required   Patrich Heinze L. Telly Jawad, COTA/L  11/22/2011, 9:52 AM

## 2011-11-24 ENCOUNTER — Ambulatory Visit (HOSPITAL_COMMUNITY)
Admission: RE | Admit: 2011-11-24 | Discharge: 2011-11-24 | Disposition: A | Discharge status: Home / Self Care | Payer: Medicare Other | Source: Ambulatory Visit | Attending: Specialist | Admitting: Specialist

## 2011-11-24 DIAGNOSIS — M6281 Muscle weakness (generalized): Secondary | ICD-10-CM

## 2011-11-24 DIAGNOSIS — M25519 Pain in unspecified shoulder: Secondary | ICD-10-CM

## 2011-11-24 DIAGNOSIS — Z9889 Other specified postprocedural states: Secondary | ICD-10-CM

## 2011-11-24 NOTE — Progress Notes (Signed)
Occupational Therapy Treatment  Patient Details  Name: Pamela [name] MRN: [id number] Date of Birth: Aug 07, 1920  Today's Date: 11/24/2011 Time: 4098-1191 Time Calculation (min): 45 min Manual Therapy 4782-9562 19' Therapeutic Exercises 1308-6578 15' Ice 10' Visit#: 3  of 36   Re-eval: 12/18/11    Subjective Symptoms/Limitations Symptoms: S:  It was a little sore after the last treatment. Pain Assessment Currently in Pain?: Yes Pain Score:   4 Pain Location: Shoulder Pain Orientation: Right Pain Type: Acute pain  Precautions/Restrictions     Exercise/Treatments Supine Protraction: PROM;10 reps Horizontal ABduction: PROM;10 reps External Rotation: PROM;10 reps Internal Rotation: PROM;10 reps Flexion: PROM;10 reps ABduction: PROM;10 reps Other Supine Exercises: bridging x 20 Seated Elevation: AROM;12 reps Extension: AROM;12 reps Row: AROM;12 reps Therapy Ball Flexion: 20 reps ABduction: 20 reps ROM / Strengthening / Isometric Strengthening   Flexion: 5X5" Extension: 5X5" External Rotation: 5X5" Internal Rotation: 5X5" ABduction: 5X5" ADduction: 5X5"    Modalities Modalities: Cryotherapy Manual Therapy Manual Therapy: Myofascial release Myofascial Release: MFR and manual stretching to right upper arm, scapular, and shoulder region to decrease fascial restrictions and increase PROM to [facility] in pain free range.  [id number] Cryotherapy Number Minutes Cryotherapy: 10 Minutes Cryotherapy Location: Shoulder  Occupational Therapy Assessment and Plan OT Assessment and Plan Clinical Impression Statement: A:  Increased PROM this date. OT Plan:  P:  Continue to increase PROM to [facility] for increased independence iwth BADLs.   Goals Short Term Goals Time to Complete Short Term Goals: Other (comment) (6 weeks) Short Term Goal 1: Patient will be educated on a HEP. Short Term Goal 1 Progress: Progressing toward goal Short Term Goal 2: Pateint will increase PROM to [facility]  for increased ability to dress and bathe herself. Short Term Goal 2 Progress: Progressing toward goal Short Term Goal 3: Patient will increase right shoulder strength to 3+/5 for increased ability to lift groceries. Short Term Goal 3 Progress: Progressing toward goal Short Term Goal 4: Patient will decrease pain to 4/10 when dressing and bathing. Short Term Goal 4 Progress: Progressing toward goal Short Term Goal 5: Patient will decrease fascial restrictions to moderate in right shoulder region. Short Term Goal 5 Progress: Progressing toward goal Long Term Goals Time to Complete Long Term Goals: 12 weeks Long Term Goal 1: Patient will return to full activity with all B/IADLs, leisure, and driving. Long Term Goal 1 Progress: Progressing toward goal Long Term Goal 2: Patient will increase right shoulder AROM to WNL for increased ability to water aerobics. Long Term Goal 2 Progress: Progressing toward goal Long Term Goal 3: Patient will increase right shoulder strength to 5/5 for increased ability to lift pots and pans when cooking. Long Term Goal 3 Progress: Progressing toward goal Long Term Goal 4: Patient will decrease fascial restrictions to mininal in her right shoulder. Long Term Goal 4 Progress: Progressing toward goal Long Term Goal 5: Patient will decrease pain in her right shoulder to 2/10 when completing daily activities. Long Term Goal 5 Progress: Progressing toward goal  Problem List Patient Active Problem List  Diagnoses  . Pain in joint, shoulder region  . Status post complete repair of rotator cuff  . Muscle weakness (generalized)    End of Session Activity Tolerance: Patient tolerated treatment well General Behavior During Session: [facility] for tasks performed Cognition: [facility] for tasks performed OT Plan of Care Consulted and Agree with Plan of Care: Patient  GO No functional reporting required  Shirlean Mylar, OTR/L  11/24/2011, 11:26 AM

## 2011-11-27 ENCOUNTER — Ambulatory Visit (HOSPITAL_COMMUNITY)
Admission: RE | Admit: 2011-11-27 | Discharge: 2011-11-27 | Disposition: A | Discharge status: Home / Self Care | Payer: Medicare Other | Source: Ambulatory Visit | Attending: Family Medicine | Admitting: Family Medicine

## 2011-11-27 DIAGNOSIS — M6281 Muscle weakness (generalized): Secondary | ICD-10-CM

## 2011-11-27 DIAGNOSIS — M25519 Pain in unspecified shoulder: Secondary | ICD-10-CM

## 2011-11-27 DIAGNOSIS — Z9889 Other specified postprocedural states: Secondary | ICD-10-CM

## 2011-11-27 NOTE — Progress Notes (Signed)
Occupational Therapy Treatment  Patient Details  Name: AVYANA PUFFENBARGER MRN: [id number] Date of Birth: 1920/12/25  Today's Date: 11/27/2011 Time: 4782-9562 Time Calculation (min): 41 min  Visit#: 4  of 36   Re-eval: 12/18/11 Manual Therapy 853-913 20' Therapeutic Exercise 914-924 10' Ice pack [phone]    Subjective Symptoms/Limitations Symptoms: S:  I had a lot of pain last night.  I slept with the ice pack last night.+ Pain Assessment Pain Score:   4 Pain Location: Shoulder Pain Orientation: Right Pain Type: Acute pain  Precautions/Restrictions     Exercise/Treatments Supine Protraction: PROM;10 reps Horizontal ABduction: PROM;10 reps External Rotation: PROM;10 reps Internal Rotation: PROM;10 reps Flexion: PROM;10 reps ABduction: PROM;10 reps Other Supine Exercises: bridging x 20 Seated Elevation: AROM;12 reps Extension: AROM;12 reps Retraction: AROM;12 reps Row: AROM;12 reps Therapy Ball Flexion: 20 reps ABduction: 20 reps ROM / Strengthening / Isometric Strengthening   Flexion: 5X5" Extension: 5X5" External Rotation: 5X5" Internal Rotation: 5X5" ABduction: 5X5" ADduction: 5X5"    Modalities Modalities: Cryotherapy Manual Therapy Manual Therapy: Myofascial release Myofascial Release: MFR and manual stretching to right upper arm, scapular, and shoulder region to decrease fascial restrictions and increase PROM to [facility] in pain free range Cryotherapy Number Minutes Cryotherapy: 10 Minutes Cryotherapy Location: Shoulder Type of Cryotherapy: Ice pack  Occupational Therapy Assessment and Plan OT Assessment and Plan Clinical Impression Statement: A:  Continues to have good PROM. Rehab Potential: Excellent OT Plan: P:  Continue per plan.   Goals Short Term Goals Time to Complete Short Term Goals: Other (comment) (6 weeks) Short Term Goal 1: Patient will be educated on a HEP. Short Term Goal 2: Pateint will increase PROM to [facility] for increased ability to  dress and bathe herself. Short Term Goal 3: Patient will increase right shoulder strength to 3+/5 for increased ability to lift groceries. Short Term Goal 4: Patient will decrease pain to 4/10 when dressing and bathing. Short Term Goal 5: Patient will decrease fascial restrictions to moderate in right shoulder region. Long Term Goals Time to Complete Long Term Goals: 12 weeks Long Term Goal 1: Patient will return to full activity with all B/IADLs, leisure, and driving. Long Term Goal 2: Patient will increase right shoulder AROM to WNL for increased ability to water aerobics. Long Term Goal 3: Patient will increase right shoulder strength to 5/5 for increased ability to lift pots and pans when cooking. Long Term Goal 4: Patient will decrease fascial restrictions to mininal in her right shoulder. Long Term Goal 5: Patient will decrease pain in her right shoulder to 2/10 when completing daily activities.  Problem List Patient Active Problem List  Diagnoses  . Pain in joint, shoulder region  . Status post complete repair of rotator cuff  . Muscle weakness (generalized)    End of Session Activity Tolerance: Patient tolerated treatment well General Behavior During Session: [facility] for tasks performed Cognition: [facility] for tasks performed  GO No functional reporting required   Jearld Hemp L. Maree Ainley, COTA/L  11/27/2011, 9:30 AM

## 2011-11-29 ENCOUNTER — Ambulatory Visit (HOSPITAL_COMMUNITY)
Admission: RE | Admit: 2011-11-29 | Discharge: 2011-11-29 | Disposition: A | Discharge status: Home / Self Care | Payer: Medicare Other | Source: Ambulatory Visit | Attending: Family Medicine | Admitting: Family Medicine

## 2011-11-29 DIAGNOSIS — Z9889 Other specified postprocedural states: Secondary | ICD-10-CM

## 2011-11-29 DIAGNOSIS — M25519 Pain in unspecified shoulder: Secondary | ICD-10-CM

## 2011-11-29 DIAGNOSIS — M6281 Muscle weakness (generalized): Secondary | ICD-10-CM | POA: Insufficient documentation

## 2011-11-29 DIAGNOSIS — IMO0001 Reserved for inherently not codable concepts without codable children: Secondary | ICD-10-CM | POA: Insufficient documentation

## 2011-11-29 DIAGNOSIS — M25619 Stiffness of unspecified shoulder, not elsewhere classified: Secondary | ICD-10-CM | POA: Insufficient documentation

## 2011-11-29 NOTE — Progress Notes (Signed)
Occupational Therapy Treatment Patient Details  Name: Pamela [name] MRN: [id number] Date of Birth: 11-03-1920  Today's Date: 11/29/2011 Time: 7829-5621 OT Time Calculation (min): 37 min Manual Therapy [phone] 29' Therapeutic Exercise 921-928 7'  Visit#: 5  of 36   Re-eval: 12/18/11    Subjective Symptoms/Limitations Symptoms: S:  It hurt me so much about 4 am this morning. Pain Assessment Currently in Pain?: No/denies Pain Score:   6 Pain Location: Shoulder Pain Orientation: Right Pain Type: Acute pain  Precautions/Restrictions     Exercise/Treatments  11/29/11 0923  Shoulder Exercises: Supine  Protraction PROM;10 reps  Horizontal ABduction PROM;10 reps  External Rotation PROM;10 reps  Internal Rotation PROM;10 reps  Flexion PROM;10 reps  ABduction PROM;10 reps  Other Supine Exercises bridging x 20  Shoulder Exercises: Seated  Elevation AROM;12 reps  Extension AROM;12 reps  Retraction AROM;12 reps  Row AROM;12 reps  Shoulder Exercises: Therapy Ball  Flexion 20 reps  ABduction 20 reps  Shoulder Exercises: Isometric Strengthening  Flexion 5X5"  Extension 5X5"  External Rotation 5X5"  Internal Rotation 5X5"  ABduction 5X5"  ADduction 5X5"             Manual Therapy Manual Therapy: Myofascial release Myofascial Release: MFR and manual stretching to right upper arm, scapular, and shoulder region to decrease fascial restrictions and increase PROM to [facility] in pain free range   Occupational Therapy Assessment and Plan OT Assessment and Plan Clinical Impression Statement: A:  Patient's pain decreased to 3/10 after session Rehab Potential: Excellent OT Plan: P:  Continue per protocol   Goals Short Term Goals Time to Complete Short Term Goals: Other (comment) (6 weeks) Short Term Goal 1: Patient will be educated on a HEP. Short Term Goal 2: Pateint will increase PROM to [facility] for increased ability to dress and bathe herself. Short Term Goal 3: Patient  will increase right shoulder strength to 3+/5 for increased ability to lift groceries. Short Term Goal 4: Patient will decrease pain to 4/10 when dressing and bathing. Short Term Goal 5: Patient will decrease fascial restrictions to moderate in right shoulder region. Long Term Goals Time to Complete Long Term Goals: 12 weeks Long Term Goal 1: Patient will return to full activity with all B/IADLs, leisure, and driving. Long Term Goal 2: Patient will increase right shoulder AROM to WNL for increased ability to water aerobics. Long Term Goal 3: Patient will increase right shoulder strength to 5/5 for increased ability to lift pots and pans when cooking. Long Term Goal 4: Patient will decrease fascial restrictions to mininal in her right shoulder. Long Term Goal 5: Patient will decrease pain in her right shoulder to 2/10 when completing daily activities.  Problem List Patient Active Problem List  Diagnoses  . Pain in joint, shoulder region  . Status post complete repair of rotator cuff  . Muscle weakness (generalized)    End of Session Activity Tolerance: Patient tolerated treatment well General Behavior During Session: [facility] for tasks performed Cognition: [facility] for tasks performed  GO No functional reporting required   Konni Kesinger L. Skanda Worlds, COTA/L  11/29/2011, 2:19 PM

## 2011-12-01 ENCOUNTER — Ambulatory Visit (HOSPITAL_COMMUNITY)
Admission: RE | Admit: 2011-12-01 | Discharge: 2011-12-01 | Disposition: A | Discharge status: Home / Self Care | Payer: Medicare Other | Source: Ambulatory Visit | Attending: Orthopedic Surgery | Admitting: Orthopedic Surgery

## 2011-12-01 DIAGNOSIS — Z9889 Other specified postprocedural states: Secondary | ICD-10-CM

## 2011-12-01 DIAGNOSIS — M6281 Muscle weakness (generalized): Secondary | ICD-10-CM

## 2011-12-01 DIAGNOSIS — M25519 Pain in unspecified shoulder: Secondary | ICD-10-CM

## 2011-12-01 NOTE — Progress Notes (Signed)
Occupational Therapy Treatment Patient Details  Name: Pamela [name] MRN: [id number] Date of Birth: [birth year]  Today's Date: 12/01/2011 Time: 4098-1191 OT Time Calculation (min): 52 min Manual Therapy [phone] 23' Therapeutic Exercise 917-929 12' Moist Heat 930-945 15'  Visit#: 6  of 36   Re-eval: 12/18/11 Assessment Diagnosis: S/P R RCR Surgical Date: 10/31/11  Subjective Symptoms/Limitations Symptoms: S:  Last night was my best night yet, I am encouraged. Pain Assessment Pain Score:   5 Pain Location: Shoulder Pain Orientation: Right Pain Type: Acute pain  Precautions/Restrictions  Precautions Precautions: Shoulder Precaution Comments: PROM only x 6 weeks  Exercise/Treatments Supine Protraction: PROM;10 reps Horizontal ABduction: PROM;10 reps External Rotation: PROM;10 reps Internal Rotation: PROM;10 reps Flexion: PROM;10 reps ABduction: PROM;10 reps Other Supine Exercises: bridging x 20 Therapy Ball Flexion: 20 reps ABduction: 20 reps ROM / Strengthening / Isometric Strengthening   Flexion: 5X5" Extension: 5X5" External Rotation: 5X5" Internal Rotation: 5X5" ABduction: 5X5" ADduction: 5X5"        Modalities Modalities: Moist Heat Manual Therapy Manual Therapy: Myofascial release Myofascial Release: MFR and manual stretching to right upper arm, scapular, and shoulder region to decrease fascial restrictions and increase PROM to [facility] in pain free range  Moist Heat Therapy Number Minutes Moist Heat: 15 Minutes Moist Heat Location: Shoulder  Occupational Therapy Assessment and Plan OT Assessment and Plan Clinical Impression Statement: A: Continues to increase in PROM. OT Plan: P:Continue with PROM til 5/14   Goals Short Term Goals Time to Complete Short Term Goals: Other (comment) (6 weeks) Short Term Goal 1: Patient will be educated on a HEP. Short Term Goal 2: Pateint will increase PROM to [facility] for increased ability to dress and bathe  herself. Short Term Goal 3: Patient will increase right shoulder strength to 3+/5 for increased ability to lift groceries. Short Term Goal 4: Patient will decrease pain to 4/10 when dressing and bathing. Short Term Goal 5: Patient will decrease fascial restrictions to moderate in right shoulder region. Long Term Goals Time to Complete Long Term Goals: 12 weeks Long Term Goal 1: Patient will return to full activity with all B/IADLs, leisure, and driving. Long Term Goal 2: Patient will increase right shoulder AROM to WNL for increased ability to water aerobics. Long Term Goal 3: Patient will increase right shoulder strength to 5/5 for increased ability to lift pots and pans when cooking. Long Term Goal 4: Patient will decrease fascial restrictions to mininal in her right shoulder. Long Term Goal 5: Patient will decrease pain in her right shoulder to 2/10 when completing daily activities.  Problem List Patient Active Problem List  Diagnoses  . Pain in joint, shoulder region  . Status post complete repair of rotator cuff  . Muscle weakness (generalized)    End of Session Activity Tolerance: Patient tolerated treatment well General Behavior During Session: [facility] for tasks performed Cognition: [facility] for tasks performed  GO No functional reporting required   Bronc Brosseau L. Makyle Eslick, COTA/L  12/01/2011, 9:47 AM

## 2011-12-04 ENCOUNTER — Ambulatory Visit (HOSPITAL_COMMUNITY)
Admission: RE | Admit: 2011-12-04 | Discharge: 2011-12-04 | Disposition: A | Discharge status: Home / Self Care | Payer: Medicare Other | Source: Ambulatory Visit | Attending: Specialist | Admitting: Specialist

## 2011-12-04 DIAGNOSIS — M6281 Muscle weakness (generalized): Secondary | ICD-10-CM

## 2011-12-04 DIAGNOSIS — Z9889 Other specified postprocedural states: Secondary | ICD-10-CM

## 2011-12-04 DIAGNOSIS — M25519 Pain in unspecified shoulder: Secondary | ICD-10-CM

## 2011-12-04 NOTE — Progress Notes (Signed)
Occupational Therapy Treatment Patient Details  Name: Pamela [name] MRN: [id number] Date of Birth: [birth year]  Today's Date: 12/04/2011 Time: 4098-1191 OT Time Calculation (min): 47 min Manual THerapy [phone] 27' Therapeutic Exercises 920-930 10' Heat 930-940 10' Visit#: 7  of 36   Re-eval: 12/18/11     Subjective Symptoms/Limitations Symptoms: S:  It was really sore this weekend.  It could be the weather. Pain Assessment Currently in Pain?: Yes Pain Score:   5 Pain Location: Shoulder Pain Orientation: Right Pain Type: Acute pain  Precautions/Restrictions   PROM untile 12/12/11  Exercise/Treatments Supine Protraction: PROM;10 reps Horizontal ABduction: PROM;10 reps External Rotation: PROM;10 reps Internal Rotation: PROM;10 reps Flexion: PROM;10 reps ABduction: PROM;10 reps Other Supine Exercises: bridging x 20 Seated Elevation: AROM;15 reps Extension: AROM;15 reps Retraction: AROM;15 reps Row: AROM;15 reps Therapy Ball Flexion: 20 reps ABduction: 20 reps Isometric Strengthening   Flexion: 5X5" Extension: 5X5" External Rotation: 5X5" Internal Rotation: 5X5" ABduction: 5X5" ADduction: 5X5"    Modalities Modalities: Moist Heat Manual Therapy Manual Therapy: Myofascial release Myofascial Release: MFR and manual stretching to right upper arm, scapular, and shoulder region to decrease fascial restrictions and increase PROM to [facility] in pain free range.  [phone] Moist Heat Therapy Number Minutes Moist Heat: 10 Minutes Moist Heat Location: Shoulder  Occupational Therapy Assessment and Plan OT Assessment and Plan Clinical Impression Statement: A:  Increased pain due to weather this date. OT Plan: P:  Add pulleys.   Goals Short Term Goals Time to Complete Short Term Goals: Other (comment) (6 weeks) Short Term Goal 1: Patient will be educated on a HEP. Short Term Goal 1 Progress: Progressing toward goal Short Term Goal 2: Pateint will increase PROM to [facility]  for increased ability to dress and bathe herself. Short Term Goal 2 Progress: Progressing toward goal Short Term Goal 3: Patient will increase right shoulder strength to 3+/5 for increased ability to lift groceries. Short Term Goal 3 Progress: Progressing toward goal Short Term Goal 4: Patient will decrease pain to 4/10 when dressing and bathing. Short Term Goal 4 Progress: Progressing toward goal Short Term Goal 5: Patient will decrease fascial restrictions to moderate in right shoulder region. Short Term Goal 5 Progress: Progressing toward goal Long Term Goals Time to Complete Long Term Goals: 12 weeks Long Term Goal 1: Patient will return to full activity with all B/IADLs, leisure, and driving. Long Term Goal 1 Progress: Progressing toward goal Long Term Goal 2: Patient will increase right shoulder AROM to WNL for increased ability to water aerobics. Long Term Goal 2 Progress: Progressing toward goal Long Term Goal 3: Patient will increase right shoulder strength to 5/5 for increased ability to lift pots and pans when cooking. Long Term Goal 3 Progress: Progressing toward goal Long Term Goal 4: Patient will decrease fascial restrictions to mininal in her right shoulder. Long Term Goal 4 Progress: Progressing toward goal Long Term Goal 5: Patient will decrease pain in her right shoulder to 2/10 when completing daily activities. Long Term Goal 5 Progress: Progressing toward goal  Problem List Patient Active Problem List  Diagnoses  . Pain in joint, shoulder region  . Status post complete repair of rotator cuff  . Muscle weakness (generalized)    End of Session Activity Tolerance: Patient tolerated treatment well General Behavior During Session: [facility] for tasks performed Cognition: [facility] for tasks performed  GO No functional reporting required  Shirlean Mylar, OTR/L  12/04/2011, 9:38 AM

## 2011-12-06 ENCOUNTER — Ambulatory Visit (HOSPITAL_COMMUNITY)
Admission: RE | Admit: 2011-12-06 | Discharge: 2011-12-06 | Disposition: A | Discharge status: Home / Self Care | Payer: Medicare Other | Source: Ambulatory Visit | Attending: Family Medicine | Admitting: Family Medicine

## 2011-12-06 NOTE — Progress Notes (Signed)
Occupational Therapy Treatment Patient Details  Name: Pamela [name] MRN: [id number] Date of Birth: 1921-04-13  Today's Date: 12/06/2011 Time: 4098-1191 OT Time Calculation (min): 61 min Manual Therapy [phone] 27' Therapeutic Exercises [phone] 19' Moist Heat 10' Visit#: 8  of 36   Re-eval: 12/18/11    Subjective Symptoms/Limitations Symptoms: S:  Im going to ask the MD if I can take something else besides Aleve. Pain Assessment Currently in Pain?: Yes Pain Score:   4 Pain Location: Shoulder Pain Orientation: Right Pain Type: Acute pain  Precautions/Restrictions   PROM only  Exercise/Treatments Supine Protraction: PROM;10 reps Horizontal ABduction: PROM;10 reps External Rotation: PROM;10 reps Internal Rotation: PROM;10 reps Flexion: PROM;10 reps ABduction: PROM;10 reps Other Supine Exercises: bridging x 25 Seated Elevation: AROM;15 reps Extension: AROM;15 reps Row: AROM;15 reps Therapy Ball Flexion: 20 reps ABduction: 20 reps Isometric Strengthening   Flexion: 5X5" Extension: 5X5" External Rotation: 5X5" Internal Rotation: 5X5" ABduction: 5X5" ADduction: 5X5"    Manual Therapy Manual Therapy: Myofascial release Myofascial Release: MFR and manual stretching to right upper arm, scapular, and shoulder region to decrease fascial restrictions and increase PROM to [name] in pain free range.  [phone] Moist Heat Therapy Number Minutes Moist Heat: 10 Minutes Moist Heat Location: Shoulder  Occupational Therapy Assessment and Plan OT Assessment and Plan Clinical Impression Statement: A:  Increased PROM this date.  Tightness noted in supraspinatus.  Good release with MFR to anterior shoulder. OT Plan: P:  Add pulleys.   Goals Short Term Goals Time to Complete Short Term Goals: Other (comment) (6 weeks) Short Term Goal 1: Patient will be educated on a HEP. Short Term Goal 2: Pateint will increase PROM to [name] for increased ability to dress and bathe herself. Short  Term Goal 3: Patient will increase right shoulder strength to 3+/5 for increased ability to lift groceries. Short Term Goal 4: Patient will decrease pain to 4/10 when dressing and bathing. Short Term Goal 5: Patient will decrease fascial restrictions to moderate in right shoulder region. Long Term Goals Time to Complete Long Term Goals: 12 weeks Long Term Goal 1: Patient will return to full activity with all B/IADLs, leisure, and driving. Long Term Goal 2: Patient will increase right shoulder AROM to WNL for increased ability to water aerobics. Long Term Goal 3: Patient will increase right shoulder strength to 5/5 for increased ability to lift pots and pans when cooking. Long Term Goal 4: Patient will decrease fascial restrictions to mininal in her right shoulder. Long Term Goal 5: Patient will decrease pain in her right shoulder to 2/10 when completing daily activities.  Problem List Patient Active Problem List  Diagnoses  . Pain in joint, shoulder region  . Status post complete repair of rotator cuff  . Muscle weakness (generalized)    End of Session Activity Tolerance: Patient tolerated treatment well General Behavior During Session: [name] for tasks performed Cognition: [name] for tasks performed  GO No functional reporting required  Shirlean Mylar, OTR/L  12/06/2011, 11:54 AM

## 2011-12-08 ENCOUNTER — Ambulatory Visit (HOSPITAL_COMMUNITY)
Admission: RE | Admit: 2011-12-08 | Discharge: 2011-12-08 | Disposition: A | Discharge status: Home / Self Care | Payer: Medicare Other | Source: Ambulatory Visit | Attending: Family Medicine | Admitting: Family Medicine

## 2011-12-08 ENCOUNTER — Ambulatory Visit (HOSPITAL_COMMUNITY): Payer: Medicare Other | Admitting: Occupational Therapy

## 2011-12-08 DIAGNOSIS — Z9889 Other specified postprocedural states: Secondary | ICD-10-CM

## 2011-12-08 DIAGNOSIS — M6281 Muscle weakness (generalized): Secondary | ICD-10-CM

## 2011-12-08 DIAGNOSIS — M25519 Pain in unspecified shoulder: Secondary | ICD-10-CM

## 2011-12-08 NOTE — Progress Notes (Signed)
Occupational Therapy Treatment Patient Details  Name: Pamela [name] MRN: [id number] Date of Birth: 17-Sep-1920  Today's Date: 12/08/2011 Time: 4098-1191 OT Time Calculation (min): 47 min Manual Therapy [phone] 21' There-ex 431-444 13' Ice pack.  478-295 11'  Visit#: 9  of 36   Re-eval: 12/18/11    Subjective Symptoms/Limitations Symptoms: S:  I have hurt so much since last time.  I can't sleep. Pain Assessment Currently in Pain?: Yes Pain Score:   8 Pain Location: Shoulder Pain Orientation: Right Pain Type: Acute pain  Precautions/Restrictions     Exercise/Treatments Supine Protraction: PROM;10 reps Horizontal ABduction: PROM;10 reps External Rotation: PROM;10 reps Internal Rotation: PROM;10 reps Flexion: PROM;10 reps ABduction: PROM;10 reps Other Supine Exercises: bridging x 25 Seated Elevation: AROM;15 reps Extension: AROM;15 reps Row: AROM;15 reps Therapy Ball Flexion: 20 reps ABduction: 20 reps ROM / Strengthening / Isometric Strengthening   Flexion:  (hold) Extension:  (hold) External Rotation:  (hold) Internal Rotation:  (hold) ABduction:  (hold) ADduction:  (hold)       Modalities Modalities: Cryotherapy Manual Therapy Manual Therapy: Myofascial release Myofascial Release: MFR and manual stretching to right upper arm, scapular, and shoulder region to decrease fascial restrictions and increase PROM to [facility] in pain free range Cryotherapy Number Minutes Cryotherapy: 10 Minutes Cryotherapy Location: Shoulder Type of Cryotherapy: Ice pack  Occupational Therapy Assessment and Plan OT Assessment and Plan Clinical Impression Statement: A:  Pain decreased to 2/10 after manual. Hold on adding pullies secondary to pain OT Plan: P: Attempt pullies if pain has decreased.   Goals Short Term Goals Time to Complete Short Term Goals: Other (comment) (6 weeks) Short Term Goal 1: Patient will be educated on a HEP. Short Term Goal 2: Pateint will increase  PROM to [facility] for increased ability to dress and bathe herself. Short Term Goal 3: Patient will increase right shoulder strength to 3+/5 for increased ability to lift groceries. Short Term Goal 4: Patient will decrease pain to 4/10 when dressing and bathing. Short Term Goal 5: Patient will decrease fascial restrictions to moderate in right shoulder region. Long Term Goals Time to Complete Long Term Goals: 12 weeks Long Term Goal 1: Patient will return to full activity with all B/IADLs, leisure, and driving. Long Term Goal 2: Patient will increase right shoulder AROM to WNL for increased ability to water aerobics. Long Term Goal 3: Patient will increase right shoulder strength to 5/5 for increased ability to lift pots and pans when cooking. Long Term Goal 4: Patient will decrease fascial restrictions to mininal in her right shoulder. Long Term Goal 5: Patient will decrease pain in her right shoulder to 2/10 when completing daily activities.  Problem List Patient Active Problem List  Diagnoses  . Pain in joint, shoulder region  . Status post complete repair of rotator cuff  . Muscle weakness (generalized)    End of Session Activity Tolerance: Patient tolerated treatment well General Behavior During Session: [facility] for tasks performed Cognition: [facility] for tasks performed  GO No functional reporting required   Ladora Osterberg L. Dorothy Landgrebe, COTA/L  12/08/2011, 5:04 PM

## 2011-12-11 ENCOUNTER — Ambulatory Visit (HOSPITAL_COMMUNITY)
Admission: RE | Admit: 2011-12-11 | Discharge: 2011-12-11 | Disposition: A | Discharge status: Home / Self Care | Payer: Medicare Other | Source: Ambulatory Visit | Attending: Family Medicine | Admitting: Family Medicine

## 2011-12-11 DIAGNOSIS — M6281 Muscle weakness (generalized): Secondary | ICD-10-CM

## 2011-12-11 DIAGNOSIS — Z9889 Other specified postprocedural states: Secondary | ICD-10-CM

## 2011-12-11 DIAGNOSIS — M25519 Pain in unspecified shoulder: Secondary | ICD-10-CM

## 2011-12-11 NOTE — Progress Notes (Signed)
Occupational Therapy Treatment Patient Details  Name: Pamela [name] MRN: [id number] Date of Birth: [birth year].  Today's Date: 12/11/2011 Time: 4098-1191 OT Time Calculation (min): 57 min Manual Therapy [phone] 33' Therapeutic Exercise 921-930 9' Moist heat 931-944 13'  Visit#: 10  of 36   Re-eval: 12/18/11 Assessment Diagnosis: S/P R RCR Surgical Date: 10/31/11 Next MD Visit: 11/28/11   Subjective Symptoms/Limitations Symptoms: S:  This arm is giving me a fit. Pain Assessment Currently in Pain?: Yes Pain Score:   8 Pain Location: Shoulder Pain Orientation: Right Pain Type: Acute pain  Precautions/Restrictions  Precautions Precautions: Shoulder Precaution Comments: PROM only x 6 weeks  Exercise/Treatments Supine Protraction: PROM;10 reps Horizontal ABduction: PROM;10 reps External Rotation: PROM;10 reps Internal Rotation: PROM;10 reps Flexion: PROM;10 reps ABduction: PROM;10 reps Other Supine Exercises: bridging x 25 Seated Elevation: AROM;15 reps Extension: AROM;15 reps Row: AROM;15 reps Therapy Ball Flexion: 20 reps ABduction: 20 reps      Modalities Modalities: Moist Heat Manual Therapy Manual Therapy: Myofascial release Myofascial Release: MFR and manual stretching to right upper arm, scapular, and shoulder region to decrease fascial restrictions and increase PROM to [facility] in pain free range  Moist Heat Therapy Number Minutes Moist Heat: 15 Minutes Moist Heat Location: Shoulder  Occupational Therapy Assessment and Plan OT Assessment and Plan Clinical Impression Statement: A:  Pain decreased to 2/10 after manual. This is the beginning of week 6. OT Plan: P:  Add dowel rod supine.   Goals Short Term Goals Time to Complete Short Term Goals: Other (comment) (6 weeks) Short Term Goal 1: Patient will be educated on a HEP. Short Term Goal 2: Pateint will increase PROM to [facility] for increased ability to dress and bathe herself. Short Term Goal 3:  Patient will increase right shoulder strength to 3+/5 for increased ability to lift groceries. Short Term Goal 4: Patient will decrease pain to 4/10 when dressing and bathing. Short Term Goal 5: Patient will decrease fascial restrictions to moderate in right shoulder region. Long Term Goals Time to Complete Long Term Goals: 12 weeks Long Term Goal 1: Patient will return to full activity with all B/IADLs, leisure, and driving. Long Term Goal 2: Patient will increase right shoulder AROM to WNL for increased ability to water aerobics. Long Term Goal 3: Patient will increase right shoulder strength to 5/5 for increased ability to lift pots and pans when cooking. Long Term Goal 4: Patient will decrease fascial restrictions to mininal in her right shoulder. Long Term Goal 5: Patient will decrease pain in her right shoulder to 2/10 when completing daily activities.  Problem List Patient Active Problem List  Diagnoses  . Pain in joint, shoulder region  . Status post complete repair of rotator cuff  . Muscle weakness (generalized)    End of Session Activity Tolerance: Patient tolerated treatment well General Behavior During Session: [facility] for tasks performed Cognition: [facility] for tasks performed  GO No functional reporting required   Andreu Drudge L. Berle Fitz, COTA/L  12/11/2011, 9:51 AM

## 2011-12-13 ENCOUNTER — Ambulatory Visit (HOSPITAL_COMMUNITY)
Admission: RE | Admit: 2011-12-13 | Discharge: 2011-12-13 | Disposition: A | Discharge status: Home / Self Care | Payer: Medicare Other | Source: Ambulatory Visit | Attending: Family Medicine | Admitting: Family Medicine

## 2011-12-13 DIAGNOSIS — Z9889 Other specified postprocedural states: Secondary | ICD-10-CM

## 2011-12-13 DIAGNOSIS — M25519 Pain in unspecified shoulder: Secondary | ICD-10-CM

## 2011-12-13 DIAGNOSIS — M6281 Muscle weakness (generalized): Secondary | ICD-10-CM

## 2011-12-13 NOTE — Progress Notes (Signed)
Occupational Therapy Treatment Patient Details  Name: Pamela [name] MRN: [id number] Date of Birth: [birth year]  Today's Date: 12/13/2011 Time: 4098-1191 OT Time Calculation (min): 53 min Manual Therapy [phone] 18' Therapeutic Exercises [phone] 24' Heat 10'   Visit#: 11  of 36   Re-eval: 12/18/11     Subjective S:  I was sore yesterday, but its okay now. Pain Assessment Currently in Pain?: No/denies Pain Score: 0-No pain  Precautions/Restrictions   Begin AAROM this date  Exercise/Treatments Supine Protraction: PROM;AAROM;10 reps Horizontal ABduction: PROM;AAROM;10 reps External Rotation: PROM;AAROM;10 reps Internal Rotation: PROM;AAROM;10 reps Flexion: PROM;AAROM;10 reps ABduction: PROM;AAROM;10 reps Other Supine Exercises: dc Seated Elevation: AROM;15 reps Extension: AROM;15 reps Row: AROM;15 reps   Pulleys Flexion: 2 minutes ABduction: 2 minutes Therapy Ball Flexion: 20 reps ABduction: 20 reps    Manual Therapy Manual Therapy: Myofascial release Myofascial Release: MFR and manual stretching to right upper arm, scapular, and shoulder region to decrease fascial restrictions and increase PROM to [facility] in pain free range.  [phone] Moist Heat Therapy Number Minutes Moist Heat: 10 Minutes Moist Heat Location: Shoulder  Occupational Therapy Assessment and Plan OT Assessment and Plan Clinical Impression Statement: A:  Able to begin AAROM this date.   OT Plan: P:  Add thumbtacks and prot/ret//elev/dep as tolerated, if no difficulty with dowel in supine, issue as HEP.   Goals Short Term Goals Time to Complete Short Term Goals: Other (comment) (6 weeks) Short Term Goal 1: Patient will be educated on a HEP. Short Term Goal 1 Progress: Progressing toward goal Short Term Goal 2: Pateint will increase PROM to [facility] for increased ability to dress and bathe herself. Short Term Goal 3: Patient will increase right shoulder strength to 3+/5 for increased ability to lift  groceries. Short Term Goal 3 Progress: Progressing toward goal Short Term Goal 4: Patient will decrease pain to 4/10 when dressing and bathing. Short Term Goal 4 Progress: Progressing toward goal Short Term Goal 5: Patient will decrease fascial restrictions to moderate in right shoulder region. Short Term Goal 5 Progress: Progressing toward goal Long Term Goals Time to Complete Long Term Goals: 12 weeks Long Term Goal 1: Patient will return to full activity with all B/IADLs, leisure, and driving. Long Term Goal 1 Progress: Progressing toward goal Long Term Goal 2: Patient will increase right shoulder AROM to WNL for increased ability to water aerobics. Long Term Goal 2 Progress: Progressing toward goal Long Term Goal 3: Patient will increase right shoulder strength to 5/5 for increased ability to lift pots and pans when cooking. Long Term Goal 3 Progress: Progressing toward goal Long Term Goal 4: Patient will decrease fascial restrictions to mininal in her right shoulder. Long Term Goal 4 Progress: Progressing toward goal Long Term Goal 5: Patient will decrease pain in her right shoulder to 2/10 when completing daily activities. Long Term Goal 5 Progress: Progressing toward goal  Problem List Patient Active Problem List  Diagnoses  . Pain in joint, shoulder region  . Status post complete repair of rotator cuff  . Muscle weakness (generalized)    End of Session Activity Tolerance: Patient tolerated treatment well General Behavior During Session: [facility] for tasks performed Cognition: [facility] for tasks performed  GO No functional reporting required  Shirlean Mylar, OTR/L  12/13/2011, 9:48 AM

## 2011-12-15 ENCOUNTER — Ambulatory Visit (HOSPITAL_COMMUNITY)
Admission: RE | Admit: 2011-12-15 | Discharge: 2011-12-15 | Disposition: A | Discharge status: Home / Self Care | Payer: Medicare Other | Source: Ambulatory Visit | Attending: Specialist | Admitting: Specialist

## 2011-12-15 DIAGNOSIS — Z9889 Other specified postprocedural states: Secondary | ICD-10-CM

## 2011-12-15 DIAGNOSIS — M6281 Muscle weakness (generalized): Secondary | ICD-10-CM

## 2011-12-15 DIAGNOSIS — M25519 Pain in unspecified shoulder: Secondary | ICD-10-CM

## 2011-12-15 NOTE — Progress Notes (Signed)
Occupational Therapy Treatment Patient Details  Name: Pamela [name] MRN: [id number] Date of Birth: [birth year]  Today's Date: 12/15/2011 Time: 4098-1191 OT Time Calculation (min): 45 min Manual Therapy [phone] 25' Therapeutic Exercises 920-940 20' Visit#: 12  of 36   Re-eval: 12/18/11    Subjective Symptoms/Limitations Symptoms: I was sore yesterday, but its ok now. Pain Assessment Currently in Pain?: No/denies Pain Score: 0-No pain  Precautions/Restrictions     O:  Exercise/Treatments Supine Protraction: PROM;AAROM;10 reps Horizontal ABduction: PROM;AAROM;10 reps External Rotation: PROM;AAROM;10 reps Internal Rotation: PROM;AAROM;10 reps Flexion: PROM;AAROM;10 reps ABduction: PROM;AAROM;10 reps Seated Elevation: AROM;15 reps Extension: AROM;15 reps Row: AROM;15 reps Pulleys Flexion: 2 minutes ABduction: 2 minutes Therapy Ball Flexion: 20 reps ABduction: 20 reps ROM / Strengthening / Isometric Strengthening Thumb Tacks: 45 seconds Prot/Ret//Elev/Dep: start next time      Manual Therapy Manual Therapy: Myofascial release Myofascial Release: MFR and manual stretching to right upper arm, scapular, and shoulder region to decrease fascial restrictions and increase PROM to [facility] in pain free range. [phone]  Occupational Therapy Assessment and Plan OT Assessment and Plan Clinical Impression Statement: A: Added thumb tacks today. Attempted to add protraction/retraction exercise but had to stop due to dizziness. BP taken 160/90. Offered patient assistance to car or to call family memember, she declined both.  OT Plan: P: Increase reps with dowel rod, issue as HEP. Add prot/ret/elev/dep.   Goals Short Term Goals Time to Complete Short Term Goals: Other (comment) (6 weeks) Short Term Goal 1: Patient will be educated on a HEP. Short Term Goal 2: Pateint will increase PROM to [facility] for increased ability to dress and bathe herself. Short Term Goal 3: Patient will increase  right shoulder strength to 3+/5 for increased ability to lift groceries. Short Term Goal 4: Patient will decrease pain to 4/10 when dressing and bathing. Short Term Goal 5: Patient will decrease fascial restrictions to moderate in right shoulder region. Long Term Goals Time to Complete Long Term Goals: 12 weeks Long Term Goal 1: Patient will return to full activity with all B/IADLs, leisure, and driving. Long Term Goal 2: Patient will increase right shoulder AROM to WNL for increased ability to water aerobics. Long Term Goal 3: Patient will increase right shoulder strength to 5/5 for increased ability to lift pots and pans when cooking. Long Term Goal 4: Patient will decrease fascial restrictions to mininal in her right shoulder. Long Term Goal 5: Patient will decrease pain in her right shoulder to 2/10 when completing daily activities.  Problem List Patient Active Problem List  Diagnoses  . Pain in joint, shoulder region  . Status post complete repair of rotator cuff  . Muscle weakness (generalized)    End of Session Activity Tolerance: Patient tolerated treatment well General Behavior During Session: [facility] for tasks performed Cognition: [facility] for tasks performed  GO No functional reporting required  Shirlean Mylar, OTR/L  12/15/2011, 11:35 AM

## 2011-12-20 ENCOUNTER — Ambulatory Visit (HOSPITAL_COMMUNITY)
Admission: RE | Admit: 2011-12-20 | Discharge: 2011-12-20 | Disposition: A | Discharge status: Home / Self Care | Payer: Medicare Other | Source: Ambulatory Visit | Attending: Family Medicine | Admitting: Family Medicine

## 2011-12-20 DIAGNOSIS — Z9889 Other specified postprocedural states: Secondary | ICD-10-CM

## 2011-12-20 DIAGNOSIS — M6281 Muscle weakness (generalized): Secondary | ICD-10-CM

## 2011-12-20 DIAGNOSIS — M25519 Pain in unspecified shoulder: Secondary | ICD-10-CM

## 2011-12-20 NOTE — Evaluation (Signed)
Physical Therapy Evaluation  Patient Details  Name: Pamela [name] MRN: [id number] Date of Birth: 1921-06-03  Today's Date: 12/20/2011 Time: 0930-1020 PT Time Calculation (min): 50 min  Visit#: 1  of 12   Re-eval: 01/19/12 Assessment Diagnosis: balance disorder  Prior Therapy: shoulder    Past Medical History: No past medical history on file. Past Surgical History: No past surgical history on file.  Subjective Symptoms/Limitations Symptoms: Ms. Solanki states that she has been having problems with her balance since she had surgery on April 2nd.  She states that so many of her friend has fallen that she started protecting herself and she feels like she is loosing her balance.  She has had surgery on her R rotator cuff and does not want to fall  therefore she was referred to physical therapy to improve her balance. How long can you sit comfortably?: no problem How long can you stand comfortably?: She has been limited in her cooking due to her shoulder surgery.   How long can you walk comfortably?: She states she does not walk much due to having neuropathy in her feet.   Pain Assessment Currently in Pain?: No/denies    Sensation/Coordination/Flexibility/Functional Tests Functional Tests Functional Tests: ABC balance = 45%  Assessment RLE Strength Right Hip Flexion: 2/5 Right Hip Extension: 2/5 Right Hip ABduction: 2/5 Right Hip ADduction: 2+/5 Right Knee Flexion: 3/5 Right Knee Extension: 3/5 Right Ankle Dorsiflexion: 4/5 Right Ankle Plantar Flexion: 4/5 LLE Strength Left Hip Flexion: 2/5 Left Hip Extension: 3-/5 Left Hip ABduction: 2/5 Left Hip ADduction: 2/5 Left Knee Flexion: 3/5 Left Knee Extension: 3/5 Left Ankle Dorsiflexion: 4/5 Left Ankle Plantar Flexion: 4/5  Exercise/Treatments Mobility/Balance  Berg Balance Test Total Score: 42/56     Seated Long Arc Quad: 5 reps Other Seated Knee Exercises: ankle DF/PF Supine Bridges: 5 reps Straight Leg  Raises: 5 reps (knees bent due to difficulty) Other Supine Knee Exercises: HIP ab/adductionx5    Physical Therapy Assessment and Plan PT Assessment and Plan Clinical Impression Statement: Pt with decreased confidence, decreased LE strength and decreased balance who will benefit from skilled PT to improve pt's safety and quality of life.    Begin balance activities ie rocker board, tandem gt, retro gait, SLS, sit to stand, trunk rotation with cone... Goals Home Exercise Program Pt will Perform Home Exercise Program: Independently PT Short Term Goals Time to Complete Short Term Goals: 2 weeks PT Short Term Goal 1: increase Berg by 5 points PT Short Term Goal 2: increase LE strength 1/2 grade PT Long Term Goals Time to Complete Long Term Goals: 4 weeks PT Long Term Goal 1: improve Berg by 10 PT Long Term Goal 2: improve LE by 1 grade Long Term Goal 3: improve ABC to at least 70%  Problem List Patient Active Problem List  Diagnoses  . Pain in joint, shoulder region  . Status post complete repair of rotator cuff  . Muscle weakness (generalized)    PT - End of Session Equipment Utilized During Treatment: Gait belt Activity Tolerance: Patient tolerated treatment well General Behavior During Session: [facility] for tasks performed Cognition: [facility] for tasks performed    Levii Hairfield,CINDY 12/20/2011, 12:13 PM  Physician Documentation Your signature is required to indicate approval of the treatment plan as stated above.  Please sign and either send electronically or make a copy of this report for your files and return this physician signed original.   Please mark one 1.__approve of plan  2. ___approve of plan with  the following conditions.   ______________________________                                                          _____________________ Physician Signature                                                                                                             Date

## 2011-12-20 NOTE — Progress Notes (Signed)
Occupational Therapy Treatment Patient Details  Name: CAYCI MCNABB MRN: [id number] Date of Birth: [birth year]  Today's Date: 12/20/2011 Time: 4098-1191 OT Time Calculation (min): 37 min Manual Therapy [phone] 26' Ice pack for pain. 621-308 10'  Visit#: 13  of 36   Re-eval: 12/20/11 Assessment Diagnosis: S/P R RCR Surgical Date: 10/31/11 Next MD Visit: 11/28/11  Subjective Symptoms/Limitations Symptoms: S:  I have been up since 4am, my arm is achy.. Pain Assessment Currently in Pain?: Yes Pain Score:   7 Pain Location: Arm Pain Orientation: Right Pain Type: Acute pain  Precautions/Restrictions     Exercise/Treatments     Manual Therapy Manual Therapy: Myofascial release Myofascial Release: MFR and manual stretching to right upper arm, scapular, shoulder region and upper bicep area to decrease fascial restrictions and increase PROM to [facility] in pain free range Cryotherapy Number Minutes Cryotherapy: 10 Minutes Cryotherapy Location: Upper arm Type of Cryotherapy: Ice pack  Occupational Therapy Assessment and Plan OT Assessment and Plan Clinical Impression Statement: A:  Hold on ex today secondary to increased pain in bicep and upper arm.  Pain did decrease to 4/10 after manual. OT Plan: P:  REASSESS   Goals Short Term Goals Time to Complete Short Term Goals: Other (comment) (6 weeks) Short Term Goal 1: Patient will be educated on a HEP. Short Term Goal 2: Pateint will increase PROM to [facility] for increased ability to dress and bathe herself. Short Term Goal 3: Patient will increase right shoulder strength to 3+/5 for increased ability to lift groceries. Short Term Goal 4: Patient will decrease pain to 4/10 when dressing and bathing. Short Term Goal 5: Patient will decrease fascial restrictions to moderate in right shoulder region. Long Term Goals Time to Complete Long Term Goals: 12 weeks Long Term Goal 1: Patient will return to full activity with all B/IADLs, leisure,  and driving. Long Term Goal 2: Patient will increase right shoulder AROM to WNL for increased ability to water aerobics. Long Term Goal 3: Patient will increase right shoulder strength to 5/5 for increased ability to lift pots and pans when cooking. Long Term Goal 4: Patient will decrease fascial restrictions to mininal in her right shoulder. Long Term Goal 5: Patient will decrease pain in her right shoulder to 2/10 when completing daily activities.  Problem List Patient Active Problem List  Diagnoses  . Pain in joint, shoulder region  . Status post complete repair of rotator cuff  . Muscle weakness (generalized)    End of Session Activity Tolerance: Patient tolerated treatment well General Behavior During Session: [facility] for tasks performed Cognition: [facility] for tasks performed   Jurnie Garritano L. Tahtiana Rozier, COTA/L  12/20/2011, 10:07 AM

## 2011-12-22 ENCOUNTER — Ambulatory Visit (HOSPITAL_COMMUNITY)
Admission: RE | Admit: 2011-12-22 | Discharge: 2011-12-22 | Disposition: A | Discharge status: Home / Self Care | Payer: Medicare Other | Source: Ambulatory Visit | Attending: Family Medicine | Admitting: Family Medicine

## 2011-12-22 DIAGNOSIS — Z9889 Other specified postprocedural states: Secondary | ICD-10-CM

## 2011-12-22 DIAGNOSIS — M6281 Muscle weakness (generalized): Secondary | ICD-10-CM

## 2011-12-22 DIAGNOSIS — M25519 Pain in unspecified shoulder: Secondary | ICD-10-CM

## 2011-12-22 NOTE — Progress Notes (Signed)
Occupational Therapy Treatment Patient Details  Name: Pamela [name] MRN: [id number] Date of Birth: 09-11-1920  Today's Date: 12/22/2011 Time: 1120-1210 OT Time Calculation (min): 50 min  Manual Therapy: 8119-1478 15' Therapeutic Exercise: 1135-1210 35' Visit#: 14  of 36   Re-eval: 01/19/12   Subjective Symptoms/Limitations Symptoms: S:  I have been kind of babying my right arm so far. Limitations: Discussed healing progress with patient and that at this point in her rehab she may start using her right arm with functional activities at shoulder height and above. Pain Assessment Currently in Pain?: Yes Pain Score:   4 Pain Location: Shoulder Pain Orientation: Right  Precautions/Restrictions: AAROM-AROM.   Exercise/Treatments Supine Protraction: PROM;AAROM;10 reps Horizontal ABduction: PROM;AAROM;10 reps External Rotation: PROM;AAROM;10 reps Internal Rotation: PROM;AAROM;10 reps Flexion: PROM;AAROM;10 reps ABduction: PROM;AAROM;10 reps Seated Elevation: AROM;15 reps Extension: AROM;15 reps Retraction: AROM;15 reps (helped support proximal arm in flexion/abduction position) Row: AROM;15 reps   Pulleys Flexion: Other (comment) (resume next visit) ABduction: Other (comment) (resume next visit) Therapy Ball Flexion: 25 reps ABduction: 25 reps ROM / Strengthening / Isometric Strengthening Thumb Tacks: 30 seconds (limited by muscular fatigue) Prot/Ret//Elev/Dep: 1' (required max prompting for proper movement pattern)      Manual Therapy Manual Therapy: Myofascial release Myofascial Release: MFR and manual stretching to right upper arm, scapular, shoulder region to decrease pain and increase mobility.    Occupational Therapy Assessment and Plan OT Assessment and Plan Clinical Impression Statement: Patient required max verbal cues and pa for proper positioning during new elev/ret exercise. Tolerating AAROM exercises well. Re-assessment performed. See evaluation note.  Readministered UEFI; scored 49/80 (61%). OT Plan: P: Continue AAROM exercises with increased reps.    Goals Home Exercise Program Pt will Perform Home Exercise Program: Independently Short Term Goals Short Term Goal 1 Progress: Progressing toward goal Short Term Goal 2 Progress: Progressing toward goal Short Term Goal 3 Progress: Progressing toward goal Short Term Goal 4 Progress: Progressing toward goal Short Term Goal 5 Progress: Progressing toward goal Long Term Goals Long Term Goal 1 Progress: Progressing toward goal Long Term Goal 2 Progress: Progressing toward goal Long Term Goal 3 Progress: Progressing toward goal Long Term Goal 4 Progress: Progressing toward goal Long Term Goal 5 Progress: Progressing toward goal  Problem List Patient Active Problem List  Diagnoses  . Pain in joint, shoulder region  . Status post complete repair of rotator cuff  . Muscle weakness (generalized)    End of Session Activity Tolerance: Patient tolerated treatment well General Behavior During Session: [facility] for tasks performed Cognition: [facility] for tasks performed  GO No functional reporting required  Shirlean Mylar, OTR/L  12/22/2011, 2:48 PM

## 2011-12-27 ENCOUNTER — Ambulatory Visit (HOSPITAL_COMMUNITY)
Admission: RE | Admit: 2011-12-27 | Discharge: 2011-12-27 | Disposition: A | Discharge status: Home / Self Care | Payer: Medicare Other | Source: Ambulatory Visit | Attending: Family Medicine | Admitting: Family Medicine

## 2011-12-27 DIAGNOSIS — M6281 Muscle weakness (generalized): Secondary | ICD-10-CM

## 2011-12-27 DIAGNOSIS — Z9889 Other specified postprocedural states: Secondary | ICD-10-CM

## 2011-12-27 DIAGNOSIS — M25519 Pain in unspecified shoulder: Secondary | ICD-10-CM

## 2011-12-27 NOTE — Progress Notes (Signed)
Physical Therapy Treatment Patient Details  Name: Pamela [name] MRN: [id number] Date of Birth: [birth year]  Today's Date: 12/27/2011 Time: 1352-1430 PT Time Calculation (min): 38 min Visit#: 2  of 12   Re-eval: 01/19/12 Charges: NMR x 25' Therex x 10'    Subjective: Symptoms/Limitations Symptoms: I feel like ever since my surgery my balance has been off. Pain Assessment Currently in Pain?: No/denies   Exercise/Treatments Standing Rocker Board: 2 minutes;Limitations Rocker Board Limitations: A/P R/L SLS: 4" B max of 5 Seated Long Arc Quad: 10 reps Other Seated Knee Exercises: Sit to stand x 10 w/o UE assist  Balance Exercises Tandem Walking: 2 round trips Retro Gait: 2 round trips Rotation with Cones: 1 RT on foam Heel Raises: 10 reps;Limitations Heel Raises Limitations: w/o UE assist Toe Raise: 10 reps;Limitations  Physical Therapy Assessment and Plan PT Assessment and Plan Clinical Impression Statement: Tx focus on improving proprioceptive control. Pt is apprehensive about completing exercises that challenge her balance. Pt requires vc's to complete tandem gait slowly and making sure she maintains her balance before taking the next step. Pt is without complaint throughout session and reports 0/10 pain at end of session. PT Plan: Continue to progress balance per PT POC.     Problem List Patient Active Problem List  Diagnoses  . Pain in joint, shoulder region  . Status post complete repair of rotator cuff  . Muscle weakness (generalized)    PT - End of Session Activity Tolerance: Patient tolerated treatment well General Behavior During Session: [facility] for tasks performed Cognition: [facility] for tasks performed    Seth Bake, PTA 12/27/2011, 2:39 PM

## 2011-12-27 NOTE — Progress Notes (Signed)
Occupational Therapy Treatment Patient Details  Name: Pamela [name] MRN: [id number] Date of Birth: 09-14-1920  Today's Date: 12/27/2011 Time: 2956-2130 OT Time Calculation (min): 50 min  Manual Therapy: 8657-8469 25' Therapeutic Exercise: 6295-2841 25' Visit#: 15  of 36   Re-eval: 01/19/12   Subjective Symptoms/Limitations Symptoms: S:  My upper arm and forearm have been bothering me this morning.  It woke me up at 3 am. Pain Assessment Currently in Pain?: Yes Pain Score:   6 Pain Location: Shoulder Pain Orientation: Right Pain Type: Acute pain  Precautions/Restrictions   AAROM progress to AROM as tolerated  Exercise/Treatments Supine Protraction: PROM;10 reps;AAROM;12 reps Horizontal ABduction: PROM;10 reps;AAROM;12 reps External Rotation: PROM;10 reps;AAROM;12 reps Internal Rotation: PROM;10 reps;AAROM;12 reps Flexion: PROM;10 reps;AAROM;12 reps ABduction: PROM;10 reps;AAROM;Other (comment) (8 reps and then pain level limited further reps) Seated Elevation: AROM;15 reps Extension: AROM;15 reps Retraction: AROM;15 reps;Other (comment) (proximal support given during this exercise for positioning) Row: AROM;15 reps   Pulleys Flexion: Other (comment) (attempted, but pt requested to hold off to due fatigue) ABduction: Other (comment) (attempted, but pt requested to hold off due to fatigue) Therapy Ball Flexion: 25 reps ABduction: 25 reps ROM / Strengthening / Isometric Strengthening Thumb Tacks: 35 seconds Prot/Ret//Elev/Dep: 30 seconds, then pt requested to be finished.      Manual Therapy Manual Therapy: Myofascial release Myofascial Release: MFR and manual stretching to right upper arm, scapular, shoulder region, and cross chest release to decrease pain and fascial restrictions and increase pain free range of motion. [phone]  Occupational Therapy Assessment and Plan OT Assessment and Plan A:  Requires manual cueing to complete abduction in supine correctly.   Increase in pain level this date. P:  Decrease pain in right shoulder region to 4/10, increase reps with dowel rod exercises and transition to AROM in supine as pain level allows.   Goals Short Term Goals Time to Complete Short Term Goals: Other (comment) (6 weeks) Short Term Goal 1: Patient will be educated on a HEP. Short Term Goal 1 Progress: Met Short Term Goal 2: Pateint will increase PROM to [facility] for increased ability to dress and bathe herself. Short Term Goal 2 Progress: Met Short Term Goal 3: Patient will increase right shoulder strength to 3+/5 for increased ability to lift groceries. Short Term Goal 3 Progress: Met Short Term Goal 4: Patient will decrease pain to 4/10 when dressing and bathing. Short Term Goal 4 Progress: Progressing toward goal Short Term Goal 5: Patient will decrease fascial restrictions to moderate in right shoulder region. Short Term Goal 5 Progress: Progressing toward goal Long Term Goals Time to Complete Long Term Goals: 12 weeks Long Term Goal 1: Patient will return to full activity with all B/IADLs, leisure, and driving. Long Term Goal 1 Progress: Progressing toward goal Long Term Goal 2: Patient will increase right shoulder AROM to WNL for increased ability to water aerobics. Long Term Goal 2 Progress: Progressing toward goal Long Term Goal 3: Patient will increase right shoulder strength to 5/5 for increased ability to lift pots and pans when cooking. Long Term Goal 3 Progress: Progressing toward goal Long Term Goal 4: Patient will decrease fascial restrictions to mininal in her right shoulder. Long Term Goal 4 Progress: Progressing toward goal Long Term Goal 5: Patient will decrease pain in her right shoulder to 2/10 when completing daily activities. Long Term Goal 5 Progress: Progressing toward goal  Problem List Patient Active Problem List  Diagnoses  . Pain in joint, shoulder region  .  Status post complete repair of rotator cuff  . Muscle  weakness (generalized)    End of Session Activity Tolerance: Patient tolerated treatment well General Behavior During Session: [facility] for tasks performed Cognition: [facility] for tasks performed  GO No functional reporting required  Shirlean Mylar, OTR/L  12/27/2011, 10:06 AM

## 2011-12-29 ENCOUNTER — Ambulatory Visit (HOSPITAL_COMMUNITY): Payer: Medicare Other | Admitting: Occupational Therapy

## 2012-01-01 ENCOUNTER — Ambulatory Visit (HOSPITAL_COMMUNITY)
Admission: RE | Admit: 2012-01-01 | Discharge: 2012-01-01 | Disposition: A | Discharge status: Home / Self Care | Payer: Medicare Other | Source: Ambulatory Visit | Attending: Family Medicine | Admitting: Family Medicine

## 2012-01-01 DIAGNOSIS — M6281 Muscle weakness (generalized): Secondary | ICD-10-CM | POA: Insufficient documentation

## 2012-01-01 DIAGNOSIS — IMO0001 Reserved for inherently not codable concepts without codable children: Secondary | ICD-10-CM | POA: Insufficient documentation

## 2012-01-01 DIAGNOSIS — M25519 Pain in unspecified shoulder: Secondary | ICD-10-CM | POA: Insufficient documentation

## 2012-01-01 DIAGNOSIS — M25619 Stiffness of unspecified shoulder, not elsewhere classified: Secondary | ICD-10-CM | POA: Insufficient documentation

## 2012-01-01 NOTE — Progress Notes (Signed)
Physical Therapy Treatment Patient Details  Name: Pamela [name] MRN: [id number] Date of Birth: 22-Jun-1921  Today's Date: 01/01/2012 Time: 4098-1191 PT Time Calculation (min): 43 min  Visit#: 3  of 12   Re-eval: 01/19/12 Charges: Therex x 10' NMR x 30'  Subjective: Symptoms/Limitations Symptoms: I think I need to space my appointments out. When I have them back to back it tires me out. Pain Assessment Currently in Pain?: Yes Pain Score:   6 Pain Location: Shoulder Pain Orientation: Right   Exercise/Treatments Standing Rocker Board: 2 minutes;Limitations Rocker Board Limitations: A/P R/L SLS: L:4" R:5" max of 5 Seated Long Arc Quad: 10 reps;Weights Long Arc Quad Weight: 3 lbs. Other Seated Knee Exercises: Sit to stand x 10 w/o UE assist Balance Exercises Tandem Walking: 2 round trips Retro Gait: 2 round trips Rotation with Cones: 1 RT on foam Heel Raises: 15 reps Heel Raises Limitations: w/o UE assist Toe Raise: 15 reps  Physical Therapy Assessment and Plan PT Assessment and Plan Clinical Impression Statement: Pt presents with decreased LOB with tandem and retro gait. Pt requires frequent vc's to avoid looking down. Pt appears more easily fatigued this session as she had an OT right before her PT appointment. Pt is without complaint (other than shoudler pain) at end of session. PT Plan: Continue to progress per PT POC.     Problem List Patient Active Problem List  Diagnoses  . Pain in joint, shoulder region  . Status post complete repair of rotator cuff  . Muscle weakness (generalized)    PT - End of Session Activity Tolerance: Patient tolerated treatment well General Behavior During Session: [facility] for tasks performed Cognition: [facility] for tasks performed  Seth Bake, PTA 01/01/2012, 5:10 PM

## 2012-01-01 NOTE — Progress Notes (Signed)
Occupational Therapy Treatment Patient Details  Name: Pamela [name] MRN: [id number] Date of Birth: [birth year]  Today's Date: 01/01/2012 Time: 1520-1605 OT Time Calculation (min): 45 min  Manual Therapy: 1520-1545 Therapeutic Exercise: 1545-1605 Visit#: 16  of 36   Re-eval: 01/19/12      Subjective Symptoms/Limitations Symptoms: S: I had a busy weekend. I think thats why it is more tight today. Pain Assessment Currently in Pain?: Yes Pain Score:   5 Pain Location: Shoulder Pain Orientation: Right Pain Type: Acute pain  Precautions/Restrictions   N/A  Exercise/Treatments Supine Protraction: PROM;AROM;10 reps Horizontal ABduction: PROM;AROM;10 reps External Rotation: PROM;AROM;10 reps Internal Rotation: PROM;AROM;10 reps Flexion: PROM;AROM;10 reps ABduction: PROM;AROM;10 reps Seated Protraction: AROM;10 reps Horizontal ABduction: AROM;10 reps External Rotation: AROM;10 reps Internal Rotation: AROM;10 reps Flexion: AROM;10 reps Abduction: AROM;10 reps Therapy Ball Right/Left: 5 reps ROM / Strengthening / Isometric Strengthening UBE (Upper Arm Bike): begin next visit Wall Wash: 1 minute "W" Arms: begin next visit X to V Arms: begin next visit      Manual Therapy Manual Therapy: Myofascial release Myofascial Release: MFR and manual stretching to right upper arm, scapular, trapezius and shoulder region to decrease fascial restrictions and pain and increase pain free range of motion. [phone].  Occupational Therapy Assessment and Plan OT Assessment and Plan Clinical Impression Statement: A: Pt reported manual therapy decreased pain and tightness. Discontinued AAROM and progressed to AROM in supine and seated positions as well as right/left therapy ball activity and wall wash.  OT Plan: P: Continue with AROM in supine and seated as well as new exercises added this date. Add arm bike, ext/ret/row with theraband, X to V, W arms, and increase wall wash duration next  visit.   Goals Short Term Goals Short Term Goal 1 Progress: Met Short Term Goal 2 Progress: Met Short Term Goal 3 Progress: Met Short Term Goal 4 Progress: Progressing toward goal Short Term Goal 5 Progress: Progressing toward goal Long Term Goals Long Term Goal 1 Progress: Progressing toward goal Long Term Goal 2 Progress: Progressing toward goal Long Term Goal 3 Progress: Progressing toward goal Long Term Goal 4 Progress: Progressing toward goal Long Term Goal 5 Progress: Progressing toward goal  Problem List Patient Active Problem List  Diagnoses  . Pain in joint, shoulder region  . Status post complete repair of rotator cuff  . Muscle weakness (generalized)    End of Session Activity Tolerance: Patient tolerated treatment well General Behavior During Session: [facility] for tasks performed Cognition: [facility] for tasks performed  GO No functional reporting required  Laverta Baltimore, OTS Occupational Therapy Student  01/01/2012, 4:17 PM

## 2012-01-01 NOTE — Progress Notes (Signed)
Occupational Therapy Treatment Patient Details  Name: Pamela [name] MRN: [id number] Date of Birth: 08-Apr-1921  Today's Date: 01/01/2012 Time: 1520-1605 OT Time Calculation (min): 45 min  Manual Therapy: 1520-1545 Therapeutic Exercise: 1545-1605 Visit#: 16  of 36   Re-eval: 01/19/12      Subjective Symptoms/Limitations Symptoms: S: I had a busy weekend. I think thats why it is more tight today. Pain Assessment Currently in Pain?: Yes Pain Score:   5 Pain Location: Shoulder Pain Orientation: Right Pain Type: Acute pain  Precautions/Restrictions   N/A  Exercise/Treatments Supine Protraction: PROM;AROM;10 reps Horizontal ABduction: PROM;AROM;10 reps External Rotation: PROM;AROM;10 reps Internal Rotation: PROM;AROM;10 reps Flexion: PROM;AROM;10 reps ABduction: PROM;AROM;10 reps Seated Protraction: AROM;10 reps Horizontal ABduction: AROM;10 reps External Rotation: AROM;10 reps Internal Rotation: AROM;10 reps Flexion: AROM;10 reps Abduction: AROM;10 reps Therapy Ball Right/Left: 5 reps ROM / Strengthening / Isometric Strengthening UBE (Upper Arm Bike): begin next visit Wall Wash: 1 minute "W" Arms: begin next visit X to V Arms: begin next visit      Manual Therapy Manual Therapy: Myofascial release Myofascial Release: MFR and manual stretching to right upper arm, scapular, trapezius and shoulder region to decrease fascial restrictions and pain and increase pain free range of motion. [phone].  Occupational Therapy Assessment and Plan OT Assessment and Plan Clinical Impression Statement: A: Pt reported manual therapy decreased pain and tightness. Discontinued AAROM and progressed to AROM in supine and seated positions as well as right/left therapy ball activity and wall wash.  OT Plan: P: Continue with AROM in supine and seated as well as new exercises added this date. Add arm bike, ext/ret/row with theraband, X to V, W arms, and increase wall wash duration next  visit.   Goals Short Term Goals Short Term Goal 1 Progress: Met Short Term Goal 2 Progress: Met Short Term Goal 3 Progress: Met Short Term Goal 4 Progress: Progressing toward goal Short Term Goal 5 Progress: Progressing toward goal Long Term Goals Long Term Goal 1 Progress: Progressing toward goal Long Term Goal 2 Progress: Progressing toward goal Long Term Goal 3 Progress: Progressing toward goal Long Term Goal 4 Progress: Progressing toward goal Long Term Goal 5 Progress: Progressing toward goal  Problem List Patient Active Problem List  Diagnoses  . Pain in joint, shoulder region  . Status post complete repair of rotator cuff  . Muscle weakness (generalized)    End of Session Activity Tolerance: Patient tolerated treatment well General Behavior During Session: [facility] for tasks performed Cognition: [facility] for tasks performed  GO No functional reporting required  Laverta Baltimore, OTS Occupational Therapy Student Note reviewed by clinical instructor and accurately reflects treatment session.  Shirlean Mylar, OTR/L  01/01/2012, 4:17 PM

## 2012-01-03 ENCOUNTER — Ambulatory Visit (HOSPITAL_COMMUNITY)
Admission: RE | Admit: 2012-01-03 | Discharge: 2012-01-03 | Disposition: A | Discharge status: Home / Self Care | Payer: Medicare Other | Source: Ambulatory Visit | Attending: Physical Therapy | Admitting: Physical Therapy

## 2012-01-03 ENCOUNTER — Ambulatory Visit (HOSPITAL_COMMUNITY)
Admission: RE | Admit: 2012-01-03 | Discharge: 2012-01-03 | Disposition: A | Discharge status: Home / Self Care | Payer: Medicare Other | Source: Ambulatory Visit | Attending: Occupational Therapy | Admitting: Occupational Therapy

## 2012-01-03 NOTE — Progress Notes (Signed)
Physical Therapy Treatment Patient Details  Name: Pamela [name] MRN: [id number] Date of Birth: 02/12/1921  Today's Date: 01/03/2012 Time: 1302-1348 PT Time Calculation (min): 46 min  Visit#: 4  of 12   Re-eval: 01/19/12  Charge: therex 25 NMR 20 min   Subjective: Symptoms/Limitations Symptoms: Pt stated she is doing alot better today, her R shoulder pain 5/10.  Pt reported she feels like her legs are getting stronger. Pain Assessment Currently in Pain?: Yes Pain Score:   5 Pain Location: Shoulder  Objective:   Exercise/Treatments Standing Heel Raises: 15 reps;Limitations Heel Raises Limitations: no UE HHA, toe raises 15reps with 1 finger A Rocker Board: 2 minutes Rocker Board Limitations: A/P R/L SLS: L6", R 5" max of 4 Other Standing Knee Exercises: 1 RT tandem gait on balance beam Seated Long Arc Quad: 10 reps;Weights Long Arc Quad Weight: 3 lbs. Other Seated Knee Exercises: Sit to stand x 10 w/o UE assist   Balance Exercises Tandem Walking: 2 round trips Retro Gait: 2 round trips Balance Beam: tandem gait 1 RT on balance beam  Physical Therapy Assessment and Plan PT Assessment and Plan Clinical Impression Statement: Pt with good balance noted wtih tandem and retro gait, pt able to independently regain LOB episodes with min assistance.  Progressed to dynamic surface requiring min-mod assistance and vc-ing to regain balance prior advancing next step.  Pt able to STS wth no HHA without difficulty but noted weak eccentric control descending. PT Plan: Continue to progress per PT POC, begin elliptical next session for activitiy tolerance.    Goals    Problem List Patient Active Problem List  Diagnoses  . Pain in joint, shoulder region  . Status post complete repair of rotator cuff  . Muscle weakness (generalized)    PT - End of Session Equipment Utilized During Treatment: Gait belt Activity Tolerance: Patient tolerated treatment well General Behavior  During Session: [facility] for tasks performed Cognition: [facility] for tasks performed  GP No functional reporting required  Juel Burrow, PTA 01/03/2012, 2:30 PM

## 2012-01-03 NOTE — Progress Notes (Signed)
Occupational Therapy Treatment Patient Details  Name: Pamela [name] MRN: [id number] Date of Birth: 24-Jun-1921  Today's Date: 01/03/2012 Time: 7829-5621 OT Time Calculation (min): 63 min  Manual Therapy: 3086-5784 20' Therapeutic Exercise: [phone] 25' Visit#: 17  of 36   Re-eval: 01/19/12   Subjective Symptoms/Limitations Symptoms: S: It is alot easier to get in/out of bed now because I can use my R arm to help. Pain Assessment Currently in Pain?: Yes Pain Score:   3 Pain Location: Shoulder Pain Orientation: Right Pain Type: Acute pain  Precautions/Restrictions   N/A  Exercise/Treatments Supine Protraction: PROM;AROM;10 reps Horizontal ABduction: PROM;AROM;10 reps External Rotation: PROM;AROM;10 reps Internal Rotation: PROM;AROM;10 reps Flexion: PROM;AROM;10 reps ABduction: PROM;AROM;10 reps Seated Protraction: AROM;10 reps Horizontal ABduction: AROM;10 reps External Rotation: AROM;10 reps Internal Rotation: AROM;10 reps Flexion: AROM;10 reps Abduction: AROM;10 reps   Pulleys Flexion: Other (comment) (1'10") ABduction: Other (comment) (1'15") Therapy Ball Right/Left: Other (comment) (7 reps) ROM / Strengthening / Isometric Strengthening UBE (Upper Arm Bike): 3' and 3' @ 1.0 Wall Wash: 1'15" "W" Arms: 10 X to V Arms: 10      Manual Therapy Manual Therapy: Myofascial release Myofascial Release: MFR and manual stretching to scapular and shoulder region to decrease fascial restrictions and pain and increase pain free ROM.  Occupational Therapy Assessment and Plan OT Assessment and Plan Clinical Impression Statement: A: Added XtoV, W arms, pulleys and UBE. Pt tolerated new exercises well and required mod verbal cues for proper form of XtoV and W arm exercises. Pt increased time of wallwash exercise and reports it is getting easier.  OT Plan: P: Increase resistance of UBE as pt stated this visit that it was too easy. Increase time of wall wash to minimum of 2  minutes. Add ext/ret/row with theraband.  Add one pound to supine exercises, increase reps with seated exercises.  Goals Short Term Goals Time to Complete Short Term Goals: Other (comment) (6 weeks) Short Term Goal 1: Patient will be educated on a HEP. Short Term Goal 2: Pateint will increase PROM to [facility] for increased ability to dress and bathe herself. Short Term Goal 3: Patient will increase right shoulder strength to 3+/5 for increased ability to lift groceries. Short Term Goal 4: Patient will decrease pain to 4/10 when dressing and bathing. Short Term Goal 5: Patient will decrease fascial restrictions to moderate in right shoulder region. Long Term Goals Time to Complete Long Term Goals: 12 weeks Long Term Goal 1: Patient will return to full activity with all B/IADLs, leisure, and driving. Long Term Goal 2: Patient will increase right shoulder AROM to WNL for increased ability to water aerobics. Long Term Goal 3: Patient will increase right shoulder strength to 5/5 for increased ability to lift pots and pans when cooking. Long Term Goal 4: Patient will decrease fascial restrictions to mininal in her right shoulder. Long Term Goal 5: Patient will decrease pain in her right shoulder to 2/10 when completing daily activities.  Problem List Patient Active Problem List  Diagnoses  . Pain in joint, shoulder region  . Status post complete repair of rotator cuff  . Muscle weakness (generalized)    End of Session Activity Tolerance: Patient tolerated treatment well General Behavior During Session: [facility] for tasks performed Cognition: [facility] for tasks performed  GO No functional reporting required  Armandina Stammer, OTS Note reviewed by clinical instructor and accurately reflects treatment session. Shirlean Mylar, OTR/L  01/03/2012, 9:41 AM

## 2012-01-05 ENCOUNTER — Ambulatory Visit (HOSPITAL_COMMUNITY)
Admission: RE | Admit: 2012-01-05 | Discharge: 2012-01-05 | Disposition: A | Discharge status: Home / Self Care | Payer: Medicare Other | Source: Ambulatory Visit | Attending: Family Medicine | Admitting: Family Medicine

## 2012-01-05 DIAGNOSIS — M25519 Pain in unspecified shoulder: Secondary | ICD-10-CM

## 2012-01-05 DIAGNOSIS — M6281 Muscle weakness (generalized): Secondary | ICD-10-CM

## 2012-01-05 DIAGNOSIS — Z9889 Other specified postprocedural states: Secondary | ICD-10-CM

## 2012-01-05 NOTE — Progress Notes (Signed)
Physical Therapy Treatment Patient Details  Name: Pamela [name] MRN: [id number] Date of Birth: 09-Feb-1921  Today's Date: 01/05/2012 Time: 4098-1191 PT Time Calculation (min): 40 min  Visit#: 5  of 12   Re-eval: 01/19/12  Charge: therex 17 NMR 23 min   Subjective: Symptoms/Limitations Symptoms: Feel like I am doing better, my legs are getting stronger but notice they are weak with activity. R shoulder pain 6/10 today following OT this morning. Pain Assessment Currently in Pain?: Yes Pain Score:   6 Pain Location: Shoulder Pain Orientation: Right  Objective:   Exercise/Treatments Aerobic Elliptical: Trial with elliptical for return to home exercise equipment and to increase activity tolerance Standing Heel Raises: Limitations Heel Raises Limitations: heel and toe walking 2 RT Rocker Board: 2 minutes Rocker Board Limitations: A/P R/L Other Standing Knee Exercises: 2 RT tandem/ retro tandem gait on balance beam Other Standing Knee Exercises: 1-15 on balance beam with L UE Seated Long Arc Quad: 10 reps;Weights Long Arc Quad Weight: 4 lbs. Other Seated Knee Exercises: Sit to stand x 10 w/o UE assist  Physical Therapy Assessment and Plan PT Assessment and Plan Clinical Impression Statement: Trial with elliptical following pt.'s statement that she had one similiar to piece of machinery at home.  Pt able to safely get on/off with min assistance but stated she was not comfortable on elliptical.  Pt with good balance noted on static surfaces with tandem/retro tandem gait.  Progressed to dynamic surfaces last session, min assistance required with tandem this session and added retro tandem without difficulty.  Began new activity reaching outside BOS on dynamic surface with L UE only due to R shoulder impairements, pt with multiple LOB episdoes noted but able to regain balance independently or with min assistance.  Overall LE strength is improving, able to progress to heel/toe walking  and increased weight with LAQ this session.  PT Plan: Continue to progress per PT POC goals.    Goals    Problem List Patient Active Problem List  Diagnoses  . Pain in joint, shoulder region  . Status post complete repair of rotator cuff  . Muscle weakness (generalized)    PT - End of Session Equipment Utilized During Treatment: Gait belt Activity Tolerance: Patient tolerated treatment well General Behavior During Session: [facility] for tasks performed Cognition: [facility] for tasks performed  GP No functional reporting required  Juel Burrow, PTA 01/05/2012, 4:10 PM

## 2012-01-05 NOTE — Progress Notes (Signed)
Occupational Therapy Treatment Patient Details  Name: Pamela [name] MRN: [id number] Date of Birth: 05-May-1921  Today's Date: 01/05/2012 Time: 7829-5621 OT Time Calculation (min): 53 min Manual Therapy [phone] 23' Therapeutic Exercises [phone] 30' Visit#: 18  of 36   Re-eval: 01/19/12 (Will actually complete on 01/12/12 due to patient vacation )    Subjective Symptoms/Limitations Symptoms: S:  I can use my right arm to reach into low kitchen cabinets now.  I can't lift much, though. Pain Assessment Currently in Pain?: Yes Pain Score:   1 Pain Location: Shoulder Pain Orientation: Right Pain Type: Acute pain  Precautions/Restrictions   N/A  Exercise/Treatments Supine Protraction: PROM;Strengthening;10 reps Protraction Weight (lbs): 1 pound Horizontal ABduction: PROM;Strengthening;10 reps Horizontal ABduction Weight (lbs): 1 pound External Rotation: PROM;Strengthening;10 reps External Rotation Weight (lbs): 1 pound Internal Rotation: PROM;Strengthening;10 reps Internal Rotation Weight (lbs): 1 pound Flexion: PROM;Strengthening;10 reps Shoulder Flexion Weight (lbs): 1 pound ABduction: PROM;Strengthening;10 reps Shoulder ABduction Weight (lbs): 1 pound Seated Extension: 10 reps;Theraband (red) Retraction: 10 reps;Theraband (red) Row: 10 reps;Theraband (red) Protraction: AROM;15 reps Horizontal ABduction: AROM;15 reps External Rotation: AROM;15 reps;Theraband;10 reps (red) Internal Rotation: AROM;15 reps;Theraband;10 reps (red) Flexion: AROM;15 reps Abduction: AROM;15 reps Therapy Ball Right/Left: Other (comment) (dc) ROM / Strengthening / Isometric Strengthening UBE (Upper Arm Bike): 3' and 3' @ 1.0 Wall Wash: 2' Thumb Tacks: 1' "W" Arms: 12 reps with moderate facilitation for proper positioning X to V Arms: 12 reps with mod facilitation for proper positioning and technique Prot/Ret//Elev/Dep: dc      Manual Therapy Manual Therapy: Myofascial  release Myofascial Release: MFR and manual stretching to scapular and shoulder region to decrease fascial restrictions and increase pain free mobility.  [phone]  Occupational Therapy Assessment and Plan OT Assessment and Plan Clinical Impression Statement: A:  Requires max verbal guidance and moderate facilitation for proper technique and positioning with scapular exercises including theraband, x to v, and w arms.  Added 1 pound to supine AROM. OT Plan: P:  Increase reps with supine strengthening, decrease amount of verbal guidance and facilitation required with exercises as patients gains strength in scapular region and improves proprioception during therapeutic exercises.   Goals Short Term Goals Time to Complete Short Term Goals: Other (comment) (6 weeks) Short Term Goal 1: Patient will be educated on a HEP. Short Term Goal 2: Pateint will increase PROM to [facility] for increased ability to dress and bathe herself. Short Term Goal 3: Patient will increase right shoulder strength to 3+/5 for increased ability to lift groceries. Short Term Goal 4: Patient will decrease pain to 4/10 when dressing and bathing. Short Term Goal 5: Patient will decrease fascial restrictions to moderate in right shoulder region. Long Term Goals Time to Complete Long Term Goals: 12 weeks Long Term Goal 1: Patient will return to full activity with all B/IADLs, leisure, and driving. Long Term Goal 2: Patient will increase right shoulder AROM to WNL for increased ability to water aerobics. Long Term Goal 3: Patient will increase right shoulder strength to 5/5 for increased ability to lift pots and pans when cooking. Long Term Goal 4: Patient will decrease fascial restrictions to mininal in her right shoulder. Long Term Goal 5: Patient will decrease pain in her right shoulder to 2/10 when completing daily activities.  Problem List Patient Active Problem List  Diagnoses  . Pain in joint, shoulder region  . Status post  complete repair of rotator cuff  . Muscle weakness (generalized)    End of Session Activity Tolerance:  Patient tolerated treatment well General Behavior During Session: [facility] for tasks performed Cognition: [facility] for tasks performed  GO No functional reporting required  Shirlean Mylar, OTR/L  01/05/2012, 10:07 AM

## 2012-01-08 ENCOUNTER — Ambulatory Visit (HOSPITAL_COMMUNITY)
Admission: RE | Admit: 2012-01-08 | Discharge: 2012-01-08 | Disposition: A | Discharge status: Home / Self Care | Payer: Medicare Other | Source: Ambulatory Visit | Attending: Family Medicine | Admitting: Family Medicine

## 2012-01-08 DIAGNOSIS — M6281 Muscle weakness (generalized): Secondary | ICD-10-CM

## 2012-01-08 DIAGNOSIS — M25519 Pain in unspecified shoulder: Secondary | ICD-10-CM

## 2012-01-08 DIAGNOSIS — Z9889 Other specified postprocedural states: Secondary | ICD-10-CM

## 2012-01-08 NOTE — Progress Notes (Signed)
Physical Therapy Treatment Patient Details  Name: Pamela [name] MRN: [id number] Date of Birth: 01-May-1921  Today's Date: 01/08/2012 Time: 4098-1191 PT Time Calculation (min): 42 min  Visit#: 6  of 12   Re-eval: 01/19/12  Charge: therex 23 min NMR 19 min  Subjective: Symptoms/Limitations Symptoms: R shoulder pain 4/10 Pain Assessment Currently in Pain?: Yes Pain Score:   4 Pain Location: Shoulder Pain Orientation: Right  Objective:   Exercise/Treatments Machines for Strengthening [employer]ex Leg Press: 2 Pl 10 reps Standing Heel Raises: Limitations Heel Raises Limitations: heel and toe walking 2 RT Functional Squat: 10 reps;Limitations Functional Squat Limitations: lifting from 6 in box Rocker Board: 2 minutes Rocker Board Limitations: A/P R/L SLS: B 5" max of 5 Other Standing Knee Exercises: 2 RT tandem, side stepping, retro tandem gait on balance beam Seated Other Seated Knee Exercises: Sit to stand x 10 w/o UE assist     Physical Therapy Assessment and Plan PT Assessment and Plan Clinical Impression Statement: Increased distance with balance beam to wall with max difficulty with reaching outside BOS on dynamic surface this session, pt unable to independently regain her LOB episodes without assistance.  Added new activities for overall LE strengthening, pt able to complete following demonstration and cueing for proper form. PT Plan: Continue progressing per PT POC goals for overall LE strengthening and balance.    Goals    Problem List Patient Active Problem List  Diagnoses  . Pain in joint, shoulder region  . Status post complete repair of rotator cuff  . Muscle weakness (generalized)    PT - End of Session Equipment Utilized During Treatment: Gait belt Activity Tolerance: Patient tolerated treatment well General Behavior During Session: [facility] for tasks performed Cognition: [facility] for tasks performed  GP No functional reporting required  Juel Burrow,  PTA 01/08/2012, 4:51 PM

## 2012-01-08 NOTE — Progress Notes (Signed)
Occupational Therapy Treatment Patient Details  Name: Pamela [name] MRN: [id number] Date of Birth: Nov 01, 1920  Today's Date: 01/08/2012 Time: 4696-2952 OT Time Calculation (min): 44 min Manual Therapy [phone] 19' Therapeutic Exercise [id number] 24  Visit#: 19  of 36   Re-eval: 01/19/12   Subjective Symptoms/Limitations Symptoms: S:  I have had the best night and weekend in a while. Special Tests: UEFI is 4/80 = 5% Pain Assessment Currently in Pain?: No/denies   Exercise/Treatments Supine Protraction: PROM;Strengthening;12 reps Protraction Weight (lbs): 1 pound Horizontal ABduction: PROM;Strengthening;12 reps Horizontal ABduction Weight (lbs): 1 pound External Rotation: PROM;Strengthening;12 reps External Rotation Weight (lbs): 1 pound Internal Rotation: PROM;Strengthening;12 reps Internal Rotation Weight (lbs): 1 pound Flexion: PROM;Strengthening;12 reps Shoulder Flexion Weight (lbs): 1 pound ABduction: PROM;Strengthening;12 reps Shoulder ABduction Weight (lbs): 1 pound Seated Extension:  (resume next visit) Retraction:  (resume next visit) Row:  (resume next visit) Protraction: Strengthening;10 reps Protraction Weight (lbs): 1 Horizontal ABduction: Strengthening;10 reps Horizontal ABduction Weight (lbs): 1 External Rotation: Strengthening;10 reps External Rotation Weight (lbs): 1 Internal Rotation: Strengthening;10 reps Internal Rotation Weight (lbs): 1 Flexion: Strengthening;10 reps Flexion Weight (lbs): 1 Abduction: Strengthening;10 reps ABduction Weight (lbs): 1 Pulleys Flexion:  (resume next visit) ABduction:  (resume next visit) Therapy Ball Right/Left:  (resume next visit) ROM / Strengthening / Isometric Strengthening UBE (Upper Arm Bike): resume next visit Wall Wash: 21/2 min Thumb Tacks: 1' "W" Arms: 12 reps with moderate facilitation for proper positioning X to V Arms: 12 reps with mod facilitation for proper positioning and technique            Manual Therapy Manual Therapy: Myofascial release Myofascial Release: MFR and manual stretching to scapular and shoulder region to decrease fascial restrictions and increase pain free mobility  Occupational Therapy Assessment and Plan OT Assessment and Plan Clinical Impression Statement: A: Added 1# to seated ex. Continue to need max physical and tactile cues for correct positioning with x to v and w-arms.  Some exercises not compelted secondary to patient needing to leave early for appointment. OT Plan: P:  Increase reps with seated ex.   Goals Short Term Goals Time to Complete Short Term Goals: Other (comment) (6 weeks) Short Term Goal 1: Patient will be educated on a HEP. Short Term Goal 2: Pateint will increase PROM to [facility] for increased ability to dress and bathe herself. Short Term Goal 3: Patient will increase right shoulder strength to 3+/5 for increased ability to lift groceries. Short Term Goal 4: Patient will decrease pain to 4/10 when dressing and bathing. Short Term Goal 5: Patient will decrease fascial restrictions to moderate in right shoulder region. Long Term Goals Time to Complete Long Term Goals: 12 weeks Long Term Goal 1: Patient will return to full activity with all B/IADLs, leisure, and driving. Long Term Goal 2: Patient will increase right shoulder AROM to WNL for increased ability to water aerobics. Long Term Goal 3: Patient will increase right shoulder strength to 5/5 for increased ability to lift pots and pans when cooking. Long Term Goal 4: Patient will decrease fascial restrictions to mininal in her right shoulder. Long Term Goal 5: Patient will decrease pain in her right shoulder to 2/10 when completing daily activities.  Problem List Patient Active Problem List  Diagnoses  . Pain in joint, shoulder region  . Status post complete repair of rotator cuff  . Muscle weakness (generalized)    End of Session Activity Tolerance: Patient tolerated  treatment well General Behavior During Session: [facility] for  tasks performed Cognition: [facility] for tasks performed  GO No functional reporting required   Renn Dirocco L. Aydan Levitz, COTA/L  01/08/2012, 9:54 AM

## 2012-01-10 ENCOUNTER — Ambulatory Visit (HOSPITAL_COMMUNITY)
Admission: RE | Admit: 2012-01-10 | Discharge: 2012-01-10 | Disposition: A | Discharge status: Home / Self Care | Payer: Medicare Other | Source: Ambulatory Visit | Attending: Family Medicine | Admitting: Family Medicine

## 2012-01-10 NOTE — Progress Notes (Signed)
Occupational Therapy Treatment Patient Details  Name: JNIYAH DANTUONO MRN: [id number] Date of Birth: [birth year]  Today's Date: 01/10/2012 Time: 4132-4401 OT Time Calculation (min): 48 min  Manual Therapy: 0272-5366 23' Therapeutic Exercise: 4403-4742 25' Visit#: 20  of 36   Re-eval: 01/19/12   Subjective Symptoms/Limitations Symptoms: I am still feeling good, not much pain at all. Pain Assessment Currently in Pain?: Yes Pain Score:   1 Pain Location: Shoulder Pain Orientation: Right Pain Type: Acute pain  Precautions/Restrictions   N/A  Exercise/Treatments Supine Protraction: PROM;10 reps;Strengthening;15 reps Protraction Weight (lbs): 1 pound Horizontal ABduction: PROM;10 reps;Strengthening;15 reps Horizontal ABduction Weight (lbs): 1 pound External Rotation: PROM;10 reps;Strengthening;15 reps External Rotation Weight (lbs): 1 pound Internal Rotation: PROM;10 reps;Strengthening;15 reps Internal Rotation Weight (lbs): 1 pound Flexion: PROM;10 reps;Strengthening;15 reps Shoulder Flexion Weight (lbs): 1 pound ABduction: PROM;10 reps;Strengthening;15 reps Shoulder ABduction Weight (lbs): 1 pound Seated Extension: Theraband;10 reps Theraband Level (Shoulder Extension): Level 2 (Red) Retraction: Theraband;10 reps Theraband Level (Shoulder Retraction): Level 2 (Red) Row: Theraband;10 reps Theraband Level (Shoulder Row): Level 2 (Red) Protraction: Strengthening;12 reps Protraction Weight (lbs): 1 Horizontal ABduction: Strengthening;12 reps Horizontal ABduction Weight (lbs): 1 External Rotation: Strengthening;12 reps External Rotation Weight (lbs): 1 Internal Rotation: Strengthening;12 reps Internal Rotation Weight (lbs): 1 Flexion: Strengthening;12 reps Flexion Weight (lbs): 1 Abduction: Strengthening;12 reps ABduction Weight (lbs): 1   Pulleys Flexion: Other (comment) (dc) ABduction: Other (comment) (dc) Therapy Ball Right/Left: Other (comment) (dc) ROM /  Strengthening / Isometric Strengthening UBE (Upper Arm Bike): 3' and 3' @ 1.0 Wall Wash: 1'15" Thumb Tacks: 1' "W" Arms: 10 reps, 1# X to V Arms: 10 reps, 1#   Occupational Therapy Assessment and Plan OT Assessment and Plan Clinical Impression Statement: A: Pt demonstrated improvement in pain and fascial restrictions. Added 1# weight to X to V, W arm exercises and wall wash. Added theraband to seated scapular exercises. Pt still requiring mod vg for proper positioning during seated exercises.  OT Plan: P: Increase resistance of UBE and duration of wall wash with 1# weight.   Goals Short Term Goals Time to Complete Short Term Goals: Other (comment) (6 weeks) Short Term Goal 1: Patient will be educated on a HEP. Short Term Goal 1 Progress: Met Short Term Goal 2: Pateint will increase PROM to [facility] for increased ability to dress and bathe herself. Short Term Goal 2 Progress: Met Short Term Goal 3: Patient will increase right shoulder strength to 3+/5 for increased ability to lift groceries. Short Term Goal 3 Progress: Met Short Term Goal 4: Patient will decrease pain to 4/10 when dressing and bathing. Short Term Goal 4 Progress: Progressing toward goal Short Term Goal 5: Patient will decrease fascial restrictions to moderate in right shoulder region. Short Term Goal 5 Progress: Met Long Term Goals Time to Complete Long Term Goals: 12 weeks Long Term Goal 1: Patient will return to full activity with all B/IADLs, leisure, and driving. Long Term Goal 1 Progress: Progressing toward goal Long Term Goal 2: Patient will increase right shoulder AROM to WNL for increased ability to water aerobics. Long Term Goal 2 Progress: Progressing toward goal Long Term Goal 3: Patient will increase right shoulder strength to 5/5 for increased ability to lift pots and pans when cooking. Long Term Goal 3 Progress: Progressing toward goal Long Term Goal 4: Patient will decrease fascial restrictions to mininal in  her right shoulder. Long Term Goal 4 Progress: Progressing toward goal Long Term Goal 5: Patient will decrease pain in her  right shoulder to 2/10 when completing daily activities. Long Term Goal 5 Progress: Progressing toward goal  Problem List Patient Active Problem List  Diagnosis  . Pain in joint, shoulder region  . Status post complete repair of rotator cuff  . Muscle weakness (generalized)    End of Session Activity Tolerance: Patient tolerated treatment well General Behavior During Session: [facility] for tasks performed Cognition: [facility] for tasks performed  GO No functional reporting required  Laverta Baltimore, OTS Occupational Therapy Student  01/10/2012, 9:41 AM

## 2012-01-10 NOTE — Progress Notes (Addendum)
Physical Therapy Treatment Patient Details  Name: Pamela [name] MRN: [id number] Date of Birth: [birth year]  Today's Date: 01/10/2012 Time: 1518-1600 PT Time Calculation (min): 42 min  Visit#: 7  of 12   Re-eval: 01/19/12 Charges: NMR x 16' Therex x 23'   Subjective: Symptoms/Limitations Symptoms: I think my balance is improving but my legs still feel a little weak. Pain Assessment Currently in Pain?: No/denies   Exercise/Treatments Machines for Strengthening Cybex Leg Press: 2 Pl 20 reps Standing Heel Raises Limitations: heel and toe walking 2 RT Functional Squat: 10 reps;Limitations Functional Squat Limitations: lifting from 6 in box Rocker Board: 2 minutes Rocker Board Limitations: A/P R/L SLS: B 5" max of 5 Other Standing Knee Exercises: 2 RT tandem, side stepping, gait on balance beam Seated Other Seated Knee Exercises: Sit to stand x 10 w/o UE assist  Physical Therapy Assessment and Plan PT Assessment and Plan Clinical Impression Statement: Pt continues to require frequent vc's for posture and keeping head up. Pt has multiple LOB on balance beam activities and require min-mod assist to recover. Pt is without complaint throughout session. PT Plan: Continue to progress per PT POC.     Problem List Patient Active Problem List  Diagnosis  . Pain in joint, shoulder region  . Status post complete repair of rotator cuff  . Muscle weakness (generalized)    PT - End of Session Activity Tolerance: Patient tolerated treatment well General Behavior During Session: [facility] for tasks performed Cognition: [facility] for tasks performed    Seth Bake, PTA 01/10/2012, 4:54 PM

## 2012-01-10 NOTE — Progress Notes (Signed)
Occupational Therapy Treatment Patient Details  Name: Pamela [name] MRN: [id number] Date of Birth: 1921/01/18  Today's Date: 01/10/2012 Time: 8295-6213 OT Time Calculation (min): 48 min  Manual Therapy: 0865-7846 23' Therapeutic Exercise: 9629-5284 25' Visit#: 20  of 36   Re-eval: 01/19/12   Subjective Symptoms/Limitations Symptoms: I am still feeling good, not much pain at all. Pain Assessment Currently in Pain?: Yes Pain Score:   1 Pain Location: Shoulder Pain Orientation: Right Pain Type: Acute pain  Precautions/Restrictions   N/A  Exercise/Treatments Supine Protraction: PROM;10 reps;Strengthening;15 reps Protraction Weight (lbs): 1 pound Horizontal ABduction: PROM;10 reps;Strengthening;15 reps Horizontal ABduction Weight (lbs): 1 pound External Rotation: PROM;10 reps;Strengthening;15 reps External Rotation Weight (lbs): 1 pound Internal Rotation: PROM;10 reps;Strengthening;15 reps Internal Rotation Weight (lbs): 1 pound Flexion: PROM;10 reps;Strengthening;15 reps Shoulder Flexion Weight (lbs): 1 pound ABduction: PROM;10 reps;Strengthening;15 reps Shoulder ABduction Weight (lbs): 1 pound Seated Extension: Theraband;10 reps Theraband Level (Shoulder Extension): Level 2 (Red) Retraction: Theraband;10 reps Theraband Level (Shoulder Retraction): Level 2 (Red) Row: Theraband;10 reps Theraband Level (Shoulder Row): Level 2 (Red) Protraction: Strengthening;12 reps Protraction Weight (lbs): 1 Horizontal ABduction: Strengthening;12 reps Horizontal ABduction Weight (lbs): 1 External Rotation: Strengthening;12 reps External Rotation Weight (lbs): 1 Internal Rotation: Strengthening;12 reps Internal Rotation Weight (lbs): 1 Flexion: Strengthening;12 reps Flexion Weight (lbs): 1 Abduction: Strengthening;12 reps ABduction Weight (lbs): 1   Pulleys Flexion: Other (comment) (dc) ABduction: Other (comment) (dc) Therapy Ball Right/Left: Other (comment) (dc) ROM /  Strengthening / Isometric Strengthening UBE (Upper Arm Bike): 3' and 3' @ 1.0 Wall Wash: 1'15" Thumb Tacks: 1' "W" Arms: 10 reps, 1# X to V Arms: 10 reps, 1#   Occupational Therapy Assessment and Plan OT Assessment and Plan Clinical Impression Statement: A: Pt demonstrated improvement in pain and fascial restrictions. Added 1# weight to X to V, W arm exercises and wall wash. Added theraband to seated scapular exercises. Pt still requiring mod vg for proper positioning during seated exercises.  OT Plan: P: Increase resistance of UBE and duration of wall wash with 1# weight.   Goals Short Term Goals Time to Complete Short Term Goals: Other (comment) (6 weeks) Short Term Goal 1: Patient will be educated on a HEP. Short Term Goal 1 Progress: Met Short Term Goal 2: Pateint will increase PROM to [facility] for increased ability to dress and bathe herself. Short Term Goal 2 Progress: Met Short Term Goal 3: Patient will increase right shoulder strength to 3+/5 for increased ability to lift groceries. Short Term Goal 3 Progress: Met Short Term Goal 4: Patient will decrease pain to 4/10 when dressing and bathing. Short Term Goal 4 Progress: Progressing toward goal Short Term Goal 5: Patient will decrease fascial restrictions to moderate in right shoulder region. Short Term Goal 5 Progress: Met Long Term Goals Time to Complete Long Term Goals: 12 weeks Long Term Goal 1: Patient will return to full activity with all B/IADLs, leisure, and driving. Long Term Goal 1 Progress: Progressing toward goal Long Term Goal 2: Patient will increase right shoulder AROM to WNL for increased ability to water aerobics. Long Term Goal 2 Progress: Progressing toward goal Long Term Goal 3: Patient will increase right shoulder strength to 5/5 for increased ability to lift pots and pans when cooking. Long Term Goal 3 Progress: Progressing toward goal Long Term Goal 4: Patient will decrease fascial restrictions to mininal in  her right shoulder. Long Term Goal 4 Progress: Progressing toward goal Long Term Goal 5: Patient will decrease pain in her  right shoulder to 2/10 when completing daily activities. Long Term Goal 5 Progress: Progressing toward goal  Problem List Patient Active Problem List  Diagnosis  . Pain in joint, shoulder region  . Status post complete repair of rotator cuff  . Muscle weakness (generalized)    End of Session Activity Tolerance: Patient tolerated treatment well General Behavior During Session: [facility] for tasks performed Cognition: [facility] for tasks performed  GO No functional reporting required  Laverta Baltimore, OTS Occupational Therapy Student Note reviewed by clinical instructor and accurately reflects treatment session. Shirlean Mylar, OTR/L   01/10/2012, 9:41 AM

## 2012-01-12 ENCOUNTER — Ambulatory Visit (HOSPITAL_COMMUNITY): Payer: Medicare Other | Admitting: *Deleted

## 2012-01-12 ENCOUNTER — Ambulatory Visit (HOSPITAL_COMMUNITY)
Admission: RE | Admit: 2012-01-12 | Discharge: 2012-01-12 | Disposition: A | Discharge status: Home / Self Care | Payer: Medicare Other | Source: Ambulatory Visit | Attending: Specialist | Admitting: Specialist

## 2012-01-12 DIAGNOSIS — M25519 Pain in unspecified shoulder: Secondary | ICD-10-CM

## 2012-01-12 DIAGNOSIS — Z9889 Other specified postprocedural states: Secondary | ICD-10-CM

## 2012-01-12 DIAGNOSIS — M6281 Muscle weakness (generalized): Secondary | ICD-10-CM

## 2012-01-12 NOTE — Progress Notes (Signed)
Occupational Therapy Treatment Patient Details  Name: SANTASIA REW MRN: [id number] Date of Birth: 20-Jan-1921  Today's Date: 01/12/2012 Time: 7829-5621 OT Time Calculation (min): 48 min Manual Therapy [phone] 31' Therapeutic Exercise [phone] 16'  Visit#: 21  of 36   Re-eval: 01/19/12 Assessment Diagnosis: S/P R RCR Surgical Date: 10/31/11   Subjective Symptoms/Limitations Symptoms: S:  I didn't rest well last night.  Precautions/Restrictions  Precautions Precautions: Shoulder Precaution Comments: PROM only x 6 weeks  Exercise/Treatments Supine Protraction: PROM;10 reps;Strengthening;15 reps Protraction Weight (lbs): 1 pound Horizontal ABduction: PROM;10 reps;Strengthening;15 reps Horizontal ABduction Weight (lbs): 1 pound External Rotation: PROM;10 reps;Strengthening;15 reps External Rotation Weight (lbs): 1 pound Internal Rotation: PROM;10 reps;Strengthening;15 reps Internal Rotation Weight (lbs): 1 pound Flexion: PROM;10 reps;Strengthening;15 reps Shoulder Flexion Weight (lbs): 1 pound ABduction: PROM;10 reps;Strengthening;15 reps Shoulder ABduction Weight (lbs): 1 pound Seated Extension: Theraband;15 reps Theraband Level (Shoulder Extension): Level 2 (Red) Retraction: Theraband;15 reps Theraband Level (Shoulder Retraction): Level 2 (Red) Row: Theraband;15 reps Theraband Level (Shoulder Row): Level 2 (Red) Protraction: Strengthening;15 reps Protraction Weight (lbs): 1 Horizontal ABduction: Strengthening;15 reps Horizontal ABduction Weight (lbs): 1 External Rotation: Strengthening;15 reps External Rotation Weight (lbs): 1 Internal Rotation: Strengthening;15 reps Internal Rotation Weight (lbs): 1 Flexion: Strengthening;15 reps Flexion Weight (lbs): 1 Abduction: Strengthening;15 reps ABduction Weight (lbs): 1 ROM / Strengthening / Isometric Strengthening UBE (Upper Arm Bike): 3' and 3' @ 1.0 Wall Wash: 1'' with 1# Thumb Tacks: 1' "W" Arms: 10 reps,  1# X to V Arms: 10 reps, 1#      Manual Therapy Manual Therapy: Myofascial release Myofascial Release: MFR and manual stretching to scapular and shoulder region to decrease fascial restrictions and increase pain free mobility   Occupational Therapy Assessment and Plan OT Assessment and Plan Clinical Impression Statement: A:  Attempted to increase wall wash with 1# to 2', patient unable to go past 1' stating it felt like it was straining her arm too much so kept wall wash with 1# to 1'. OT Plan: P:  Attempt to increase time with wall wash.   Goals Short Term Goals Time to Complete Short Term Goals: Other (comment) (6 weeks) Short Term Goal 1: Patient will be educated on a HEP. Short Term Goal 2: Pateint will increase PROM to [facility] for increased ability to dress and bathe herself. Short Term Goal 3: Patient will increase right shoulder strength to 3+/5 for increased ability to lift groceries. Short Term Goal 4: Patient will decrease pain to 4/10 when dressing and bathing. Short Term Goal 5: Patient will decrease fascial restrictions to moderate in right shoulder region. Long Term Goals Time to Complete Long Term Goals: 12 weeks Long Term Goal 1: Patient will return to full activity with all B/IADLs, leisure, and driving. Long Term Goal 2: Patient will increase right shoulder AROM to WNL for increased ability to water aerobics. Long Term Goal 3: Patient will increase right shoulder strength to 5/5 for increased ability to lift pots and pans when cooking. Long Term Goal 4: Patient will decrease fascial restrictions to mininal in her right shoulder. Long Term Goal 5: Patient will decrease pain in her right shoulder to 2/10 when completing daily activities.  Problem List Patient Active Problem List  Diagnosis  . Pain in joint, shoulder region  . Status post complete repair of rotator cuff  . Muscle weakness (generalized)    End of Session Activity Tolerance: Patient tolerated treatment  well General Behavior During Session: [facility] for tasks performed Cognition: [facility] for tasks performed  GO No functional reporting required   Montrice Gracey L. Santanna Whitford, COTA/L  01/12/2012, 11:58 AM

## 2012-01-15 ENCOUNTER — Ambulatory Visit (HOSPITAL_COMMUNITY): Payer: Medicare Other | Admitting: Occupational Therapy

## 2012-01-15 ENCOUNTER — Ambulatory Visit (HOSPITAL_COMMUNITY): Payer: Medicare Other | Admitting: *Deleted

## 2012-01-17 ENCOUNTER — Ambulatory Visit (HOSPITAL_COMMUNITY): Payer: Medicare Other | Admitting: Physical Therapy

## 2012-01-17 ENCOUNTER — Ambulatory Visit (HOSPITAL_COMMUNITY): Payer: Medicare Other | Admitting: Occupational Therapy

## 2012-01-19 ENCOUNTER — Ambulatory Visit (HOSPITAL_COMMUNITY): Payer: Medicare Other | Admitting: Occupational Therapy

## 2012-01-19 ENCOUNTER — Ambulatory Visit (HOSPITAL_COMMUNITY): Payer: Medicare Other | Admitting: Physical Therapy

## 2012-01-22 ENCOUNTER — Other Ambulatory Visit (HOSPITAL_COMMUNITY): Payer: Self-pay | Admitting: Family Medicine

## 2012-01-22 ENCOUNTER — Ambulatory Visit (HOSPITAL_COMMUNITY)
Admission: RE | Admit: 2012-01-22 | Discharge: 2012-01-22 | Disposition: A | Discharge status: Home / Self Care | Payer: Medicare Other | Source: Ambulatory Visit | Attending: Family Medicine | Admitting: Family Medicine

## 2012-01-22 DIAGNOSIS — M6281 Muscle weakness (generalized): Secondary | ICD-10-CM

## 2012-01-22 DIAGNOSIS — Z139 Encounter for screening, unspecified: Secondary | ICD-10-CM

## 2012-01-22 DIAGNOSIS — M25519 Pain in unspecified shoulder: Secondary | ICD-10-CM

## 2012-01-22 DIAGNOSIS — Z9889 Other specified postprocedural states: Secondary | ICD-10-CM

## 2012-01-22 NOTE — Progress Notes (Signed)
Occupational Therapy Treatment Patient Details  Name: ROSELL KHOURI MRN: [id number] Date of Birth: 01/25/1921  Today's Date: 01/22/2012 Time: 4098-1191 OT Time Calculation (min): 50 min  Manual Therapy: 4782-9562 20' Re-eval: 0910-0920 10' Therapeutic Exercise: 0920-0940 20' Visit#: 22  of 36   Re-eval: 02/19/12    Subjective Symptoms/Limitations Symptoms: S: I feel tighter, I think from missing therapy last week and being so active. (Patient missed last week's therapy appointments due to vacation trip) Pain Assessment Currently in Pain?: Yes Pain Score:   2 Pain Location: Shoulder Pain Orientation: Right Pain Type: Acute pain  Precautions/Restrictions   N/A  Exercise/Treatments Supine Protraction: PROM;10 reps;Strengthening;15 reps Protraction Weight (lbs): 1 pound Horizontal ABduction: PROM;10 reps;Strengthening;15 reps Horizontal ABduction Weight (lbs): 1 pound External Rotation: PROM;10 reps;Strengthening;15 reps External Rotation Weight (lbs): 1 pound Internal Rotation: PROM;10 reps;Strengthening;15 reps Internal Rotation Weight (lbs): 1 pound Flexion: PROM;10 reps;Strengthening;15 reps Shoulder Flexion Weight (lbs): 1 pound ABduction: PROM;10 reps;Strengthening;15 reps Shoulder ABduction Weight (lbs): 1 pound Seated Extension: Theraband;15 reps Theraband Level (Shoulder Extension): Level 2 (Red) Retraction: Theraband;15 reps Theraband Level (Shoulder Retraction): Level 2 (Red) Row: Theraband;15 reps Theraband Level (Shoulder Row): Level 2 (Red) Protraction: Strengthening;5 reps Protraction Weight (lbs): 2 Horizontal ABduction: Strengthening;5 reps Horizontal ABduction Weight (lbs): 2 External Rotation: Strengthening;5 reps External Rotation Weight (lbs): 1 Internal Rotation: Strengthening;5 reps Internal Rotation Weight (lbs): 1 Flexion: Strengthening;5 reps Flexion Weight (lbs): 2 Abduction: Strengthening;5 reps ABduction Weight (lbs): 2 ROM /  Strengthening / Isometric Strengthening UBE (Upper Arm Bike): 3' and 3' @ 1.0 Wall Wash: resume next visit Thumb Tacks: resume next visit "W" Arms: resume next visit X to V Arms: resume next visit      Manual Therapy Manual Therapy: Myofascial release Myofascial Release: MFR and massage to right scapular, trapezius, shoulder and upper arm regions to decrease pain and restrictions and increase pain free P/AROM. Increased restrictions noted in upper trapezius, shoulder and upper arm regions.  Occupational Therapy Assessment and Plan OT Assessment and Plan Clinical Impression Statement: A: Attempted to increase seated strengthening to 2# but patient demonstrated compensation by elevating shoulder and reported a substantial increase in pain. Substituted 1# weight for remaining exercises.  Re-assessed this date, see note for further details. OT Plan: P: Resume exercises missed this visit due to time spent on re-eval. Increase supine strengthening to 2#, keep seated strengthening at 1#. Increase resistance on UBE. Administer UEFI.  Goals Short Term Goals Time to Complete Short Term Goals: Other (comment) (6 weeks) Short Term Goal 1: Patient will be educated on a HEP. Short Term Goal 1 Progress: Met Short Term Goal 2: Pateint will increase PROM to [facility] for increased ability to dress and bathe herself. Short Term Goal 2 Progress: Met Short Term Goal 3: Patient will increase right shoulder strength to 3+/5 for increased ability to lift groceries. Short Term Goal 3 Progress: Met Short Term Goal 4: Patient will decrease pain to 4/10 when dressing and bathing. Short Term Goal 4 Progress: Met Short Term Goal 5: Patient will decrease fascial restrictions to moderate in right shoulder region. Short Term Goal 5 Progress: Met Long Term Goals Time to Complete Long Term Goals: 12 weeks Long Term Goal 1: Patient will return to full activity with all B/IADLs, leisure, and driving. Long Term Goal 1  Progress: Progressing toward goal Long Term Goal 2: Patient will increase right shoulder AROM to WNL for increased ability to water aerobics. Long Term Goal 2 Progress: Progressing toward goal Long Term Goal  3: Patient will increase right shoulder strength to 5/5 for increased ability to lift pots and pans when cooking. Long Term Goal 3 Progress: Progressing toward goal Long Term Goal 4: Patient will decrease fascial restrictions to mininal in her right shoulder. Long Term Goal 4 Progress: Progressing toward goal Long Term Goal 5: Patient will decrease pain in her right shoulder to 2/10 when completing daily activities. Long Term Goal 5 Progress: Progressing toward goal  Problem List Patient Active Problem List  Diagnosis  . Pain in joint, shoulder region  . Status post complete repair of rotator cuff  . Muscle weakness (generalized)    End of Session Activity Tolerance: Patient tolerated treatment well General Behavior During Session: [facility] for tasks performed Cognition: [facility] for tasks performed Laverta Baltimore, OTS Occupational Therapy Student Note reviewed by clinical instructor and accurately reflects treatment session.  Shirlean Mylar, OTR/L  01/22/2012, 9:43 AM

## 2012-01-22 NOTE — Progress Notes (Signed)
Occupational Therapy Treatment Patient Details  Name: Pamela [name] MRN: [id number] Date of Birth: 1920/10/15  Today's Date: 01/22/2012 Time: 4098-1191 OT Time Calculation (min): 50 min  Manual Therapy: 4782-9562 20' Re-eval: 0910-0920 10' Therapeutic Exercise: 0920-0940 20' Visit#: 22  of 36   Re-eval: 02/19/12    Subjective Symptoms/Limitations Symptoms: S: I feel tighter, I think from missing therapy last week and being so active. (Patient missed last week's therapy appointments due to vacation trip) Pain Assessment Currently in Pain?: Yes Pain Score:   2 Pain Location: Shoulder Pain Orientation: Right Pain Type: Acute pain  Precautions/Restrictions   N/A  Exercise/Treatments Supine Protraction: PROM;10 reps;Strengthening;15 reps Protraction Weight (lbs): 1 pound Horizontal ABduction: PROM;10 reps;Strengthening;15 reps Horizontal ABduction Weight (lbs): 1 pound External Rotation: PROM;10 reps;Strengthening;15 reps External Rotation Weight (lbs): 1 pound Internal Rotation: PROM;10 reps;Strengthening;15 reps Internal Rotation Weight (lbs): 1 pound Flexion: PROM;10 reps;Strengthening;15 reps Shoulder Flexion Weight (lbs): 1 pound ABduction: PROM;10 reps;Strengthening;15 reps Shoulder ABduction Weight (lbs): 1 pound Seated Extension: Theraband;15 reps Theraband Level (Shoulder Extension): Level 2 (Red) Retraction: Theraband;15 reps Theraband Level (Shoulder Retraction): Level 2 (Red) Row: Theraband;15 reps Theraband Level (Shoulder Row): Level 2 (Red) Protraction: Strengthening;5 reps Protraction Weight (lbs): 2 Horizontal ABduction: Strengthening;5 reps Horizontal ABduction Weight (lbs): 2 External Rotation: Strengthening;5 reps External Rotation Weight (lbs): 1 Internal Rotation: Strengthening;5 reps Internal Rotation Weight (lbs): 1 Flexion: Strengthening;5 reps Flexion Weight (lbs): 2 Abduction: Strengthening;5 reps ABduction Weight (lbs): 2 ROM /  Strengthening / Isometric Strengthening UBE (Upper Arm Bike): 3' and 3' @ 1.0 Wall Wash: resume next visit Thumb Tacks: resume next visit "W" Arms: resume next visit X to V Arms: resume next visit      Manual Therapy Manual Therapy: Myofascial release Myofascial Release: MFR and massage to right scapular, trapezius, shoulder and upper arm regions to decrease pain and restrictions and increase pain free P/AROM. Increased restrictions noted in upper trapezius, shoulder and upper arm regions.  Occupational Therapy Assessment and Plan OT Assessment and Plan Clinical Impression Statement: A: Attempted to increase seated strengthening to 2# but patient demonstrated compensation by elevating shoulder and reported a substantial increase in pain. Substituted 1# weight for remaining exercises.  Re-assessed this date, see note for further details. OT Plan: P: Resume exercises missed this visit due to time spent on re-eval. Increase supine strengthening to 2#, keep seated strengthening at 1#. Increase resistance on UBE.   Goals Short Term Goals Time to Complete Short Term Goals: Other (comment) (6 weeks) Short Term Goal 1: Patient will be educated on a HEP. Short Term Goal 1 Progress: Met Short Term Goal 2: Pateint will increase PROM to [facility] for increased ability to dress and bathe herself. Short Term Goal 2 Progress: Met Short Term Goal 3: Patient will increase right shoulder strength to 3+/5 for increased ability to lift groceries. Short Term Goal 3 Progress: Met Short Term Goal 4: Patient will decrease pain to 4/10 when dressing and bathing. Short Term Goal 4 Progress: Met Short Term Goal 5: Patient will decrease fascial restrictions to moderate in right shoulder region. Short Term Goal 5 Progress: Met Long Term Goals Time to Complete Long Term Goals: 12 weeks Long Term Goal 1: Patient will return to full activity with all B/IADLs, leisure, and driving. Long Term Goal 1 Progress: Progressing  toward goal Long Term Goal 2: Patient will increase right shoulder AROM to WNL for increased ability to water aerobics. Long Term Goal 2 Progress: Progressing toward goal Long Term Goal 3:  Patient will increase right shoulder strength to 5/5 for increased ability to lift pots and pans when cooking. Long Term Goal 3 Progress: Progressing toward goal Long Term Goal 4: Patient will decrease fascial restrictions to mininal in her right shoulder. Long Term Goal 4 Progress: Progressing toward goal Long Term Goal 5: Patient will decrease pain in her right shoulder to 2/10 when completing daily activities. Long Term Goal 5 Progress: Progressing toward goal  Problem List Patient Active Problem List  Diagnosis  . Pain in joint, shoulder region  . Status post complete repair of rotator cuff  . Muscle weakness (generalized)    End of Session Activity Tolerance: Patient tolerated treatment well General Behavior During Session: [facility] for tasks performed Cognition: [facility] for tasks performed Laverta Baltimore, OTS Occupational Therapy Student  01/22/2012, 9:43 AM

## 2012-01-22 NOTE — Progress Notes (Signed)
Physical Therapy Treatment Patient Details  Name: Pamela [name] MRN: [id number] Date of Birth: Mar 01, 1921  Today's Date: 01/22/2012 Time: 1310-1355 PT Time Calculation (min): 45 min  Visit#: 8  of 12   Re-eval: 01/19/12    Authorization: [employer]   Subjective: Symptoms/Limitations Symptoms: I am not doing my HEP very regularly.  Pt explained the importance of completing HEP  Precautions/Restrictions falls    Exercise/Treatments     Balance Exercises Standing SLS: Eyes open;3 reps Wall Bumps: Shoulder;Hips;10 reps Balance Beam: tandem with mini squat, side step w/squat, retro x 2 rep Marching: 10 reps Heel Raises: 10 reps Sit to Stand: Standard surface;Limitations;Other (comment) Sit to Stand Limitations: x 10R/L Other Standing Exercises: minisquat x 10     Seated Other Seated Exercises: Bike at 2.5 x 8'       Physical Therapy Assessment and Plan PT Assessment and Plan Clinical Impression Statement: Pt keeps COG behind her base of support needs constant verbal cuing throughout treatment. Rehab Potential: Good PT Plan: begin foam rotation; reassess    Goals    Problem List Patient Active Problem List  Diagnosis  . Pain in joint, shoulder region  . Status post complete repair of rotator cuff  . Muscle weakness (generalized)    PT - End of Session Equipment Utilized During Treatment: Gait belt Activity Tolerance: Patient tolerated treatment well General Behavior During Session: [facility] for tasks performed Cognition: [facility] for tasks performed  GP No functional reporting required  Jeanny Rymer,CINDY 01/22/2012, 2:11 PM

## 2012-01-24 ENCOUNTER — Ambulatory Visit (HOSPITAL_COMMUNITY)
Admission: RE | Admit: 2012-01-24 | Discharge: 2012-01-24 | Disposition: A | Discharge status: Home / Self Care | Payer: Medicare Other | Source: Ambulatory Visit | Attending: Family Medicine | Admitting: Family Medicine

## 2012-01-24 DIAGNOSIS — M6281 Muscle weakness (generalized): Secondary | ICD-10-CM

## 2012-01-24 DIAGNOSIS — Z9889 Other specified postprocedural states: Secondary | ICD-10-CM

## 2012-01-24 DIAGNOSIS — M25519 Pain in unspecified shoulder: Secondary | ICD-10-CM

## 2012-01-24 NOTE — Progress Notes (Addendum)
Physical Therapy Evaluation  Patient Details  Name: Pamela [name] MRN: [id number] Date of Birth: 05-26-1921  Today's Date: 01/24/2012 Time: 1520-1608 PT Time Calculation (min): 48 min  Visit#: 9  of 12   Re-eval: 02/21/12 Diagnosis: balance disorder  Charges:  PPT 20', self care 10', MMT X 1 unit   Subjective Symptoms/Limitations Symptoms: Pt. states she had a bad night last night and was unable to sleep due to her neuropathy. Currently no pain in her LE's due to taking an aleeve, but states they do feel a little weak. How long can you stand comfortably?: Now able to stand long enought to prepare a small meal approx 20 minutes. How long can you walk comfortably?: Pt. can walk 30' without diffiuculty. Pain Assessment Currently in Pain?: No/denies  Objective: ABC balance confidence scale = 77.5% (was 45%)  RLE Strength Right Hip Flexion: 3/5 (was 2/5) Right Hip Extension: 3-/5 (was 2/5) Right Hip ABduction: 3+/5 (was 2/5) Right Hip ADduction: 3-/5 (was 2+/5) Right Knee Flexion: 3+/5 (was 3/5) Right Knee Extension: 3+/5 (was 3/5) Right Ankle Dorsiflexion: 5/5 (was 4/5) Right Ankle Plantar Flexion: 4/5 (was 4/5)  LLE Strength Left Hip Flexion: 3/5 (was 2/5) Left Hip Extension: 3/5 (3-/5) Left Hip ABduction: 3/5 (was 2/5) Left Hip ADduction: 3/5 (was 2/5) Left Knee Flexion: 3+/5 (was 3/5) Left Knee Extension: 3+/5 (was 3/5) Left Ankle Dorsiflexion: 5/5 (was 4/5) Left Ankle Plantar Flexion: 4/5 (was 4/5)  Exercise/Treatments Mobility/Balance  Berg Balance Test Sit to Stand: Able to stand without using hands and stabilize independently Standing Unsupported: Able to stand safely 2 minutes Sitting with Back Unsupported but Feet Supported on Floor or Stool: Able to sit safely and securely 2 minutes Stand to Sit: Sits safely with minimal use of hands Transfers: Able to transfer safely, minor use of hands Standing Unsupported with Eyes Closed: Able to stand 10 seconds  safely Standing Ubsupported with Feet Together: Able to place feet together independently and stand 1 minute safely From Standing, Reach Forward with Outstretched Arm: Can reach confidently >25 cm (10") From Standing Position, Pick up Object from Floor: Able to pick up shoe safely and easily From Standing Position, Turn to Look Behind Over each Shoulder: Turn sideways only but maintains balance Turn 360 Degrees: Able to turn 360 degrees safely but slowly Standing Unsupported, Alternately Place Feet on Step/Stool: Able to stand independently and safely and complete 8 steps in 20 seconds Standing Unsupported, One Foot in Front: Able to plae foot ahead of the other independently and hold 30 seconds Standing on One Leg: Able to lift leg independently and hold equal to or more than 3 seconds Total Score: 49  (was 42)       Physical Therapy Assessment and Plan PT Assessment and Plan Clinical Impression Statement: Pt. has made great gains in strength and balance the past 4 weeks.  Sharlene Motts is now 49/52 (was 19) and ABC confidence scale has increased to 77.5% confidence (was 45%).  Pt. has met all STG's and 1/3 LTG's. Pt. still is not at the functional level she desires and would benefit from further therapy. PT Plan: suggest continuation of therapy X 4 more weeks.    Goals Home Exercise Program Pt will Perform Home Exercise Program: Independently PT Goal: Perform Home Exercise Program - Progress: Progressing toward goal  PT Short Term Goals Time to Complete Short Term Goals: 2 weeks PT Short Term Goal 1: increase Berg by 5 points PT Short Term Goal 1 - Progress: Met PT  Short Term Goal 2: increase LE strength 1/2 grade PT Short Term Goal 2 - Progress: Met  PT Long Term Goals Time to Complete Long Term Goals: 4 weeks PT Long Term Goal 1: improve Berg by 10 PT Long Term Goal 1 - Progress: Progressing toward goal PT Long Term Goal 2: improve LE by 1 grade PT Long Term Goal 2 - Progress:  Progressing toward goal Long Term Goal 3: improve ABC to at least 70% Long Term Goal 3 Progress: Met New long term goal:  Increase ABC to at least 75% Problem List Patient Active Problem List  Diagnosis  . Pain in joint, shoulder region  . Status post complete repair of rotator cuff  . Muscle weakness (generalized)    Lurena Nida, PTA/CLT/Cindy Calvyn Kurtzman PT 01/24/2012, 4:51 PM

## 2012-01-24 NOTE — Progress Notes (Signed)
Occupational Therapy Treatment Patient Details  Name: Pamela [name] MRN: [id number] Date of Birth: [birth year]  Today's Date: 01/24/2012 Time: 7829-5621 OT Time Calculation (min): 48 min  Visit#: 23  of 36   Re-eval: 02/19/12 Manual Therapy [id number] 24' Therapeutic Exercise [id number] 23'   Subjective Symptoms/Limitations Symptoms: S:  My shoulder gave me a fit last night. Pain Assessment Currently in Pain?: Yes Pain Score:   5 Pain Location: Shoulder Pain Orientation: Right Pain Type: Acute pain  Precautions/Restrictions     Exercise/Treatments Supine Protraction: PROM;10 reps;Strengthening;15 reps Protraction Weight (lbs): 1 pound Horizontal ABduction: PROM;10 reps;Strengthening;15 reps Horizontal ABduction Weight (lbs): 1 pound External Rotation: PROM;10 reps;Strengthening;15 reps External Rotation Weight (lbs): 1 pound Internal Rotation: PROM;10 reps;Strengthening;15 reps Internal Rotation Weight (lbs): 1 pound Flexion: PROM;10 reps;Strengthening;15 reps Shoulder Flexion Weight (lbs): 1 pound ABduction: PROM;10 reps;Strengthening;15 reps Shoulder ABduction Weight (lbs): 1 pound Seated Extension: Theraband;15 reps Theraband Level (Shoulder Extension): Level 2 (Red) Retraction: Theraband;15 reps Theraband Level (Shoulder Retraction): Level 2 (Red) Row: Theraband;15 reps Theraband Level (Shoulder Row): Level 2 (Red) Protraction: Strengthening;10 reps Protraction Weight (lbs): 1 Horizontal ABduction: Strengthening;10 reps Horizontal ABduction Weight (lbs): 1 External Rotation: Strengthening;10 reps External Rotation Weight (lbs): 1 Internal Rotation: Strengthening;10 reps Internal Rotation Weight (lbs): 1 Flexion: Strengthening;10 reps Flexion Weight (lbs): 1 Abduction: Strengthening;10 reps ABduction Weight (lbs): 1 ROM / Strengthening / Isometric Strengthening UBE (Upper Arm Bike): 3' and 3' @ 1.0 Wall Wash: 2' without wt secondary to pain. Thumb Tacks:  1' "W" Arms: x10 X to V Arms: x10         Manual Therapy Manual Therapy: Myofascial release Myofascial Release: MFR and massage to right scapular, trapezius, shoulder and upper arm regions to decrease pain and restrictions and increase pain free P/AROM. Increased restrictions noted in upper trapezius, shoulder and upper arm regions  Occupational Therapy Assessment and Plan OT Assessment and Plan Clinical Impression Statement: A:  Did not increase resistance with any exercises and did not att the one pound to wall wash secondary to patients increased pain today on arrival.  Pain much decreased at end of session. OT Plan: P:  Attempt to increase to 2# in supine if patient not in pain.   Goals Short Term Goals Time to Complete Short Term Goals: Other (comment) (6 weeks) Short Term Goal 1: Patient will be educated on a HEP. Short Term Goal 2: Pateint will increase PROM to [facility] for increased ability to dress and bathe herself. Short Term Goal 3: Patient will increase right shoulder strength to 3+/5 for increased ability to lift groceries. Short Term Goal 4: Patient will decrease pain to 4/10 when dressing and bathing. Short Term Goal 5: Patient will decrease fascial restrictions to moderate in right shoulder region. Long Term Goals Time to Complete Long Term Goals: 12 weeks Long Term Goal 1: Patient will return to full activity with all B/IADLs, leisure, and driving. Long Term Goal 2: Patient will increase right shoulder AROM to WNL for increased ability to water aerobics. Long Term Goal 3: Patient will increase right shoulder strength to 5/5 for increased ability to lift pots and pans when cooking. Long Term Goal 4: Patient will decrease fascial restrictions to mininal in her right shoulder. Long Term Goal 5: Patient will decrease pain in her right shoulder to 2/10 when completing daily activities.  Problem List Patient Active Problem List  Diagnosis  . Pain in joint, shoulder region   . Status post complete repair of rotator cuff  . Muscle  weakness (generalized)    End of Session Activity Tolerance: Patient tolerated treatment well General Behavior During Session: [facility] for tasks performed Cognition: [facility] for tasks performed  GO    Marlissa Emerick L. Teliah Buffalo, COTA/L   01/24/2012, 10:07 AM

## 2012-01-26 ENCOUNTER — Ambulatory Visit (HOSPITAL_COMMUNITY): Payer: Medicare Other

## 2012-01-26 ENCOUNTER — Ambulatory Visit (HOSPITAL_COMMUNITY)
Admission: RE | Admit: 2012-01-26 | Discharge: 2012-01-26 | Disposition: A | Discharge status: Home / Self Care | Payer: Medicare Other | Source: Ambulatory Visit | Attending: Family Medicine | Admitting: Family Medicine

## 2012-01-26 ENCOUNTER — Ambulatory Visit (HOSPITAL_COMMUNITY): Payer: Medicare Other | Admitting: Physical Therapy

## 2012-01-26 DIAGNOSIS — Z9889 Other specified postprocedural states: Secondary | ICD-10-CM

## 2012-01-26 DIAGNOSIS — M6281 Muscle weakness (generalized): Secondary | ICD-10-CM

## 2012-01-26 DIAGNOSIS — M25519 Pain in unspecified shoulder: Secondary | ICD-10-CM

## 2012-01-26 NOTE — Progress Notes (Signed)
Occupational Therapy Treatment Patient Details  Name: TAURIEL SCRONCE MRN: [id number] Date of Birth: 08/16/1920  Today's Date: 01/26/2012 Time: 4098-1191 OT Time Calculation (min): 61 min Manual Therapy [phone] 19' Therapeutic Exercises [phone] 25' Visit#: 24  of 36   Re-eval: 02/19/12    Subjective Symptoms/Limitations Symptoms: S:  I think that 2# weight was too much for me the other day.   Pain Assessment Currently in Pain?: Yes Pain Score:   2 Pain Location: Shoulder Pain Orientation: Right Pain Type: Acute pain  Precautions/Restrictions   N/A  Exercise/Treatments Supine Protraction: PROM;10 reps;Strengthening;15 reps Protraction Weight (lbs): 1 pound Horizontal ABduction: PROM;10 reps;Strengthening;15 reps Horizontal ABduction Weight (lbs): 1 pound External Rotation: PROM;10 reps;Strengthening;15 reps External Rotation Weight (lbs): 1 pound Internal Rotation: PROM;10 reps;Strengthening;15 reps Internal Rotation Weight (lbs): 1 pound Flexion: PROM;10 reps;Strengthening;15 reps Shoulder Flexion Weight (lbs): 1 pound ABduction: PROM;10 reps;Strengthening;15 reps Shoulder ABduction Weight (lbs): 1 pound Seated Extension: Theraband;10 reps (green) Retraction: Theraband;10 reps (green) Row: Theraband;10 reps (green) Protraction: Strengthening;12 reps Protraction Weight (lbs): 1 Horizontal ABduction: Strengthening;12 reps Horizontal ABduction Weight (lbs): 1 External Rotation: Strengthening;12 reps;Theraband;10 reps (green) External Rotation Weight (lbs): 1 Internal Rotation: Strengthening;12 reps;Theraband;10 reps (green) Internal Rotation Weight (lbs): 1 Flexion: Strengthening;12 reps Flexion Weight (lbs): 1 Abduction: Strengthening;12 reps ABduction Weight (lbs): 1 Therapy Ball Right/Left: Other (comment) (dc to hep) ROM / Strengthening / Isometric Strengthening UBE (Upper Arm Bike): 3' and 3' 1.5 Cybex Press: 1 plate;10 reps Cybex Row: 1 plate;10  reps Wall Wash: 2' (1' with 1# and then removed secondary to weight began to cause increased pain in her shoulder) Thumb Tacks: dc "W" Arms: x10 with 1# with facilitation for proper positioning and sequencing X to V Arms: x10 with 1#      Manual Therapy Myofascial Release: MFR and manual stretching to right scapular, trapezius, upper arm, and shoulder region to decrease pain and restrictions and increase pain free ROM.  [phone]  Occupational Therapy Assessment and Plan OT Assessment and Plan Clinical Impression Statement: A:  Significantly less pain this date.  Requires moderate vg to remain seated with back off of chair during seated exercises and tactile cuing with scapular W arm exercise. Able to use 1# x 1' during wall wash.  Increased resistance on UBE due to relative ease with previous resistance.  Added cybex press and row in order to increase scapular/proximal shoulder strengthening. OT Plan: P:  Maintain 1# seated and supine for 2 additional visits, decrease amount of verbal and tactile cuing needed for scapular strengthening exercises.    Goals Short Term Goals Time to Complete Short Term Goals: Other (comment) (6 weeks) Short Term Goal 1: Patient will be educated on a HEP. Short Term Goal 2: Pateint will increase PROM to [facility] for increased ability to dress and bathe herself. Short Term Goal 3: Patient will increase right shoulder strength to 3+/5 for increased ability to lift groceries. Short Term Goal 4: Patient will decrease pain to 4/10 when dressing and bathing. Short Term Goal 5: Patient will decrease fascial restrictions to moderate in right shoulder region. Long Term Goals Time to Complete Long Term Goals: 12 weeks Long Term Goal 1: Patient will return to full activity with all B/IADLs, leisure, and driving. Long Term Goal 2: Patient will increase right shoulder AROM to WNL for increased ability to water aerobics. Long Term Goal 3: Patient will increase right shoulder  strength to 5/5 for increased ability to lift pots and pans when cooking. Long Term Goal 4:  Patient will decrease fascial restrictions to mininal in her right shoulder. Long Term Goal 5: Patient will decrease pain in her right shoulder to 2/10 when completing daily activities.  Problem List Patient Active Problem List  Diagnosis  . Pain in joint, shoulder region  . Status post complete repair of rotator cuff  . Muscle weakness (generalized)    End of Session Activity Tolerance: Patient tolerated treatment well General Behavior During Session: [facility] for tasks performed Cognition: [facility] for tasks performed   Shirlean Mylar, OTR/L  01/26/2012, 10:22 AM

## 2012-01-29 ENCOUNTER — Ambulatory Visit (HOSPITAL_COMMUNITY)
Admission: RE | Admit: 2012-01-29 | Discharge: 2012-01-29 | Disposition: A | Discharge status: Home / Self Care | Payer: Medicare Other | Source: Ambulatory Visit | Attending: Family Medicine | Admitting: Family Medicine

## 2012-01-29 ENCOUNTER — Other Ambulatory Visit (HOSPITAL_COMMUNITY): Payer: Self-pay | Admitting: Family Medicine

## 2012-01-29 DIAGNOSIS — Z9889 Other specified postprocedural states: Secondary | ICD-10-CM

## 2012-01-29 DIAGNOSIS — M6281 Muscle weakness (generalized): Secondary | ICD-10-CM | POA: Insufficient documentation

## 2012-01-29 DIAGNOSIS — IMO0001 Reserved for inherently not codable concepts without codable children: Secondary | ICD-10-CM | POA: Insufficient documentation

## 2012-01-29 DIAGNOSIS — Z1231 Encounter for screening mammogram for malignant neoplasm of breast: Secondary | ICD-10-CM | POA: Insufficient documentation

## 2012-01-29 DIAGNOSIS — M25619 Stiffness of unspecified shoulder, not elsewhere classified: Secondary | ICD-10-CM | POA: Insufficient documentation

## 2012-01-29 DIAGNOSIS — M25519 Pain in unspecified shoulder: Secondary | ICD-10-CM | POA: Insufficient documentation

## 2012-01-29 DIAGNOSIS — Z139 Encounter for screening, unspecified: Secondary | ICD-10-CM

## 2012-01-29 NOTE — Progress Notes (Signed)
Occupational Therapy Treatment Patient Details  Name: Pamela [name] MRN: [id number] Date of Birth: 1920-08-21  Today's Date: 01/29/2012 Time: 1478-2956 OT Time Calculation (min): 59 min Manual Therapy [phone] 27' Therapeutic Exercise [id number] 31'  Visit#: 25  of 36   Re-eval: 02/19/12       Subjective Symptoms/Limitations Symptoms: S:  I slept in this morning. Pain Assessment Currently in Pain?: Yes Pain Score:   4 Pain Location: Shoulder Pain Orientation: Right  Precautions/Restrictions  Precautions Precautions: Shoulder Precaution Comments: PROM only x 6 weeks  Exercise/Treatments Supine Protraction: PROM;10 reps;Strengthening;15 reps Protraction Weight (lbs): 1 pound Horizontal ABduction: PROM;10 reps;Strengthening;15 reps Horizontal ABduction Weight (lbs): 1 pound External Rotation: PROM;10 reps;Strengthening;15 reps External Rotation Weight (lbs): 1 pound Internal Rotation: PROM;10 reps;Strengthening;15 reps Internal Rotation Weight (lbs): 1 pound Flexion: PROM;10 reps;Strengthening;15 reps Shoulder Flexion Weight (lbs): 1 pound ABduction: PROM;10 reps;Strengthening;15 reps Shoulder ABduction Weight (lbs): 1 pound Seated Extension: Theraband;10 reps Theraband Level (Shoulder Extension): Level 3 (Green) Retraction: Theraband;10 reps Theraband Level (Shoulder Retraction): Level 3 (Green) Row: Theraband;10 reps Theraband Level (Shoulder Row): Level 3 (Green) Protraction: Strengthening;12 reps Protraction Weight (lbs): 1 Horizontal ABduction: Strengthening;12 reps Horizontal ABduction Weight (lbs): 1 External Rotation: Strengthening;12 reps;Theraband;10 reps Theraband Level (Shoulder External Rotation): Level 3 (Green) External Rotation Weight (lbs): 1 Internal Rotation: Strengthening;12 reps;Theraband;10 reps Theraband Level (Shoulder Internal Rotation): Level 3 (Green) Internal Rotation Weight (lbs): 1 Flexion: Strengthening;12 reps Flexion Weight  (lbs): 1 Abduction: Strengthening;12 reps ABduction Weight (lbs): 1 ROM / Strengthening / Isometric Strengthening UBE (Upper Arm Bike): 3' and 3' 1.5 Cybex Press: 1 plate;10 reps Cybex Row: 1 plate;10 reps Wall Wash: 2' (1' with 1# and then removed secondary to weight began to cause increased pain in her shoulder) Thumb Tacks: d/c "W" Arms: x10 with 1# with facilitation for proper positioning and sequencing X to V Arms: x10 with 1#       Manual Therapy Manual Therapy: Myofascial release Myofascial Release: MFR and manual stretching to right scapular, trapezius, upper arm, and shoulder region to decrease pain and restrictions and increase pain free ROM  Occupational Therapy Assessment and Plan OT Assessment and Plan Clinical Impression Statement: A: Patient with some increase in pain this session stating she cleaned her closet out this weekend and feels she may have overworked her shoulder.  Min-mod physical and tactile cues needed for form with seated weighted ex and x to v and W arms.  Patient also c/o bicep constantly being sore, explained to patient she is using her bicep to compensate for shoulder weakness.  Encouraged patient to keep her elbow straight to let her shoulder do all the work vs. using elbow to compensate. OT Plan: P:  Continue to keep at 1# for seated ex secondary to form.  Attempt 2# in supine.   Goals Short Term Goals Time to Complete Short Term Goals: Other (comment) (6 weeks) Short Term Goal 1: Patient will be educated on a HEP. Short Term Goal 2: Pateint will increase PROM to [facility] for increased ability to dress and bathe herself. Short Term Goal 3: Patient will increase right shoulder strength to 3+/5 for increased ability to lift groceries. Short Term Goal 4: Patient will decrease pain to 4/10 when dressing and bathing. Short Term Goal 5: Patient will decrease fascial restrictions to moderate in right shoulder region. Long Term Goals Time to Complete Long Term  Goals: 12 weeks Long Term Goal 1: Patient will return to full activity with all B/IADLs, leisure, and driving. Long Term  Goal 2: Patient will increase right shoulder AROM to WNL for increased ability to water aerobics. Long Term Goal 3: Patient will increase right shoulder strength to 5/5 for increased ability to lift pots and pans when cooking. Long Term Goal 4: Patient will decrease fascial restrictions to mininal in her right shoulder. Long Term Goal 5: Patient will decrease pain in her right shoulder to 2/10 when completing daily activities.  Problem List Patient Active Problem List  Diagnosis  . Pain in joint, shoulder region  . Status post complete repair of rotator cuff  . Muscle weakness (generalized)    End of Session Activity Tolerance: Patient tolerated treatment well General Behavior During Session: [facility] for tasks performed Cognition: [facility] for tasks performed  GO    Wylan Gentzler L. Youssef Footman, COTA/L  01/29/2012, 1:23 PM

## 2012-01-30 ENCOUNTER — Ambulatory Visit (HOSPITAL_COMMUNITY)
Admission: RE | Admit: 2012-01-30 | Discharge: 2012-01-30 | Disposition: A | Discharge status: Home / Self Care | Payer: Medicare Other | Source: Ambulatory Visit | Attending: Family Medicine | Admitting: Family Medicine

## 2012-01-30 NOTE — Progress Notes (Signed)
Physical Therapy Treatment Patient Details  Name: MAYCIE LUERA MRN: [id number] Date of Birth: [birth year]  Today's Date: 01/30/2012 Time: 4098-1191 PT Time Calculation (min): 47 min  Visit#: 10  of 12   Re-eval: 02/24/12 Charges: NMR x 39'  Authorization: [employer]    Subjective: Symptoms/Limitations Symptoms: Pt reports that she is feeling more confident and she can feel an improvement in her balance. Pain Assessment Currently in Pain?: No/denies   Exercise/Treatments Balance Exercises Standing SLS: Eyes open;3 reps;Limitations SLS Limitations: 4" max B Wall Bumps: Shoulder;Hips;10 reps Balance Beam: tandem with mini squat, side step w/squat, retro x 2 rep Marching: 10 reps Heel Raises: 10 reps Sit to Stand: Standard surface;Limitations;Other (comment) Sit to Stand Limitations: x 10R/L Other Standing Exercises: minisquat x 10  Seated Other Seated Exercises: Bike at 3.0 x 6'   Physical Therapy Assessment and Plan PT Assessment and Plan Clinical Impression Statement: Pt has most difficulty with wall bumps this session. Pt tends to take small steps back when she feel that her COG is posterior to BOS. Pt requires vc's to avoid hip adduction with functional squats. Pt also tends to internally rotates hip with progressive lunges. PT Plan: Continue to progress per PT POC.     Problem List Patient Active Problem List  Diagnosis  . Pain in joint, shoulder region  . Status post complete repair of rotator cuff  . Muscle weakness (generalized)    PT - End of Session Activity Tolerance: Patient tolerated treatment well General Behavior During Session: [facility] for tasks performed Cognition: [facility] for tasks performed   Seth Bake, PTA 01/30/2012, 10:33 AM

## 2012-01-31 ENCOUNTER — Ambulatory Visit (HOSPITAL_COMMUNITY)
Admission: RE | Admit: 2012-01-31 | Discharge: 2012-01-31 | Disposition: A | Discharge status: Home / Self Care | Payer: Medicare Other | Source: Ambulatory Visit | Attending: Family Medicine | Admitting: Family Medicine

## 2012-01-31 NOTE — Progress Notes (Signed)
Occupational Therapy Treatment Patient Details  Name: Pamela [name] MRN: [id number] Date of Birth: [birth year]  Today's Date: 01/31/2012 Time: 4098-1191 OT Time Calculation (min): 53 min  Manual Therapy: [id number] 28' Therapeutic Exercise: [id number] 25' Visit#: 26  of 36   Re-eval: 02/19/12    Subjective Symptoms/Limitations Symptoms: S: Its a little sore this morning, but I know thats not a bad thing. I've got to work it to get it better. Pain Assessment Currently in Pain?: Yes Pain Score:   3 Pain Location: Shoulder Pain Orientation: Right Pain Type: Acute pain  Precautions/Restrictions   N/A  Exercise/Treatments Supine Protraction: PROM;Strengthening;10 reps Protraction Weight (lbs): 2# Horizontal ABduction: PROM;Strengthening;10 reps Horizontal ABduction Weight (lbs): 2# External Rotation: PROM;Strengthening;10 reps External Rotation Weight (lbs): 2# Internal Rotation: PROM;Strengthening;10 reps Internal Rotation Weight (lbs): 2# Flexion: PROM;Strengthening;10 reps Shoulder Flexion Weight (lbs): 2# ABduction: PROM;Strengthening;Left;Limitations Shoulder ABduction Weight (lbs): 1# ABduction Limitations: pt requested to use 1# weight as 2# was too difficult Seated Extension: Theraband;12 reps Theraband Level (Shoulder Extension): Level 3 (Green) Retraction: Theraband;12 reps Theraband Level (Shoulder Retraction): Level 3 (Green) Row: Theraband;12 reps Theraband Level (Shoulder Row): Level 3 (Green) Protraction: Strengthening;10 reps Protraction Weight (lbs): 2# Horizontal ABduction: Strengthening;10 reps Horizontal ABduction Weight (lbs): 2# External Rotation: Theraband;12 reps Theraband Level (Shoulder External Rotation): Level 3 (Green) Internal Rotation: Theraband;12 reps Theraband Level (Shoulder Internal Rotation): Level 3 (Green) Flexion: Strengthening;10 reps Flexion Weight (lbs): 2# Abduction: Strengthening;10 reps ABduction Weight (lbs): 2#      ROM / Strengthening / Isometric Strengthening UBE (Upper Arm Bike): 3' and 3' 1.5 Cybex Press: 1 plate;10 reps Cybex Row: 1 plate;10 reps Wall Wash: 2' with 1# weight "W" Arms: x10 with 1# with facilitation for proper positioning and sequencing X to V Arms: x10 with 1#        Manual Therapy Manual Therapy: Myofascial release Myofascial Release: MFR and massage to right scapular, strapezius, upper arm and shoulder region around scar to decrease pain and restrictions and increase pain free P/AROM. Manual stretching in all shoulder regions also performed.  Occupational Therapy Assessment and Plan OT Assessment and Plan Clinical Impression Statement: A: Increased weight of strengthening in supine and seated with minimal tactile and verbal cues to keep shoulder depressed during seated strengthening for proper form. Patient tolerated full 2 minutes of wall wash with 1#, improving from last visit when she was only able to tolerate 1 minute of 1# weight. OT Plan: Increase reps of supine and seated strengthening with 2# weight and decrease amount of cues required for proper positioning.   Goals Short Term Goals Time to Complete Short Term Goals: Other (comment) (6 weeks) Short Term Goal 1: Patient will be educated on a HEP. Short Term Goal 1 Progress: Met Short Term Goal 2: Pateint will increase PROM to [facility] for increased ability to dress and bathe herself. Short Term Goal 2 Progress: Met Short Term Goal 3: Patient will increase right shoulder strength to 3+/5 for increased ability to lift groceries. Short Term Goal 3 Progress: Met Short Term Goal 4: Patient will decrease pain to 4/10 when dressing and bathing. Short Term Goal 4 Progress: Met Short Term Goal 5: Patient will decrease fascial restrictions to moderate in right shoulder region. Short Term Goal 5 Progress: Met Long Term Goals Time to Complete Long Term Goals: 12 weeks Long Term Goal 1: Patient will return to full activity with  all B/IADLs, leisure, and driving. Long Term Goal 1 Progress: Progressing toward goal Long Term Goal 2:  Patient will increase right shoulder AROM to WNL for increased ability to water aerobics. Long Term Goal 2 Progress: Progressing toward goal Long Term Goal 3: Patient will increase right shoulder strength to 5/5 for increased ability to lift pots and pans when cooking. Long Term Goal 3 Progress: Progressing toward goal Long Term Goal 4: Patient will decrease fascial restrictions to mininal in her right shoulder. Long Term Goal 4 Progress: Progressing toward goal Long Term Goal 5: Patient will decrease pain in her right shoulder to 2/10 when completing daily activities. Long Term Goal 5 Progress: Progressing toward goal  Problem List Patient Active Problem List  Diagnosis  . Pain in joint, shoulder region  . Status post complete repair of rotator cuff  . Muscle weakness (generalized)    End of Session Activity Tolerance: Patient tolerated treatment well General Behavior During Session: [facility] for tasks performed Cognition: [facility] for tasks performed  GO   Laverta Baltimore, OTS Occupational Therapy Student  01/31/2012, 9:44 AM

## 2012-01-31 NOTE — Progress Notes (Signed)
Occupational Therapy Treatment Patient Details  Name: Pamela [name] MRN: [id number] Date of Birth: 06-14-1921  Today's Date: 01/31/2012 Time: 4098-1191 OT Time Calculation (min): 53 min  Manual Therapy: [id number] 28' Therapeutic Exercise: [id number] 25' Visit#: 26  of 36   Re-eval: 02/19/12    Subjective Symptoms/Limitations Symptoms: S: Its a little sore this morning, but I know thats not a bad thing. I've got to work it to get it better. Pain Assessment Currently in Pain?: Yes Pain Score:   3 Pain Location: Shoulder Pain Orientation: Right Pain Type: Acute pain  Precautions/Restrictions   N/A  Exercise/Treatments Supine Protraction: PROM;Strengthening;10 reps Protraction Weight (lbs): 2# Horizontal ABduction: PROM;Strengthening;10 reps Horizontal ABduction Weight (lbs): 2# External Rotation: PROM;Strengthening;10 reps External Rotation Weight (lbs): 2# Internal Rotation: PROM;Strengthening;10 reps Internal Rotation Weight (lbs): 2# Flexion: PROM;Strengthening;10 reps Shoulder Flexion Weight (lbs): 2# ABduction: PROM;Strengthening;Left;Limitations Shoulder ABduction Weight (lbs): 1# ABduction Limitations: pt requested to use 1# weight as 2# was too difficult Seated Extension: Theraband;12 reps Theraband Level (Shoulder Extension): Level 3 (Green) Retraction: Theraband;12 reps Theraband Level (Shoulder Retraction): Level 3 (Green) Row: Theraband;12 reps Theraband Level (Shoulder Row): Level 3 (Green) Protraction: Strengthening;10 reps Protraction Weight (lbs): 2# Horizontal ABduction: Strengthening;10 reps Horizontal ABduction Weight (lbs): 2# External Rotation: Theraband;12 reps Theraband Level (Shoulder External Rotation): Level 3 (Green) Internal Rotation: Theraband;12 reps Theraband Level (Shoulder Internal Rotation): Level 3 (Green) Flexion: Strengthening;10 reps Flexion Weight (lbs): 2# Abduction: Strengthening;10 reps ABduction Weight (lbs): 2#      ROM / Strengthening / Isometric Strengthening UBE (Upper Arm Bike): 3' and 3' 1.5 Cybex Press: 1 plate;10 reps Cybex Row: 1 plate;10 reps Wall Wash: 2' with 1# weight "W" Arms: x10 with 1# with facilitation for proper positioning and sequencing X to V Arms: x10 with 1#        Manual Therapy Manual Therapy: Myofascial release Myofascial Release: MFR and massage to right scapular, strapezius, upper arm and shoulder region around scar to decrease pain and restrictions and increase pain free P/AROM. Manual stretching in all shoulder regions also performed.  Occupational Therapy Assessment and Plan OT Assessment and Plan Clinical Impression Statement: A: Increased weight of strengthening in supine and seated with minimal tactile and verbal cues to keep shoulder depressed during seated strengthening for proper form. Patient tolerated full 2 minutes of wall wash with 1#, improving from last visit when she was only able to tolerate 1 minute of 1# weight. OT Plan: Increase reps of supine and seated strengthening with 2# weight and decrease amount of cues required for proper positioning.   Goals Short Term Goals Time to Complete Short Term Goals: Other (comment) (6 weeks) Short Term Goal 1: Patient will be educated on a HEP. Short Term Goal 1 Progress: Met Short Term Goal 2: Pateint will increase PROM to [facility] for increased ability to dress and bathe herself. Short Term Goal 2 Progress: Met Short Term Goal 3: Patient will increase right shoulder strength to 3+/5 for increased ability to lift groceries. Short Term Goal 3 Progress: Met Short Term Goal 4: Patient will decrease pain to 4/10 when dressing and bathing. Short Term Goal 4 Progress: Met Short Term Goal 5: Patient will decrease fascial restrictions to moderate in right shoulder region. Short Term Goal 5 Progress: Met Long Term Goals Time to Complete Long Term Goals: 12 weeks Long Term Goal 1: Patient will return to full activity with  all B/IADLs, leisure, and driving. Long Term Goal 1 Progress: Progressing toward goal Long Term Goal 2:  Patient will increase right shoulder AROM to WNL for increased ability to water aerobics. Long Term Goal 2 Progress: Progressing toward goal Long Term Goal 3: Patient will increase right shoulder strength to 5/5 for increased ability to lift pots and pans when cooking. Long Term Goal 3 Progress: Progressing toward goal Long Term Goal 4: Patient will decrease fascial restrictions to mininal in her right shoulder. Long Term Goal 4 Progress: Progressing toward goal Long Term Goal 5: Patient will decrease pain in her right shoulder to 2/10 when completing daily activities. Long Term Goal 5 Progress: Progressing toward goal  Problem List Patient Active Problem List  Diagnosis  . Pain in joint, shoulder region  . Status post complete repair of rotator cuff  . Muscle weakness (generalized)    End of Session Activity Tolerance: Patient tolerated treatment well General Behavior During Session: [facility] for tasks performed Cognition: [facility] for tasks performed  GO   Laverta Baltimore, OTS Occupational Therapy Student Note reviewed by clinical instructor and accurately reflects treatment session.  Shirlean Mylar, OTR/L  01/31/2012, 9:44 AM

## 2012-02-02 ENCOUNTER — Ambulatory Visit (HOSPITAL_COMMUNITY): Payer: Medicare Other | Admitting: Physical Therapy

## 2012-02-02 ENCOUNTER — Ambulatory Visit (HOSPITAL_COMMUNITY)
Admission: RE | Admit: 2012-02-02 | Discharge: 2012-02-02 | Disposition: A | Discharge status: Home / Self Care | Payer: Medicare Other | Source: Ambulatory Visit | Attending: Family Medicine | Admitting: Family Medicine

## 2012-02-02 DIAGNOSIS — Z9889 Other specified postprocedural states: Secondary | ICD-10-CM

## 2012-02-02 DIAGNOSIS — M6281 Muscle weakness (generalized): Secondary | ICD-10-CM

## 2012-02-02 DIAGNOSIS — M25519 Pain in unspecified shoulder: Secondary | ICD-10-CM

## 2012-02-02 NOTE — Progress Notes (Signed)
Occupational Therapy Treatment Patient Details  Name: Pamela [name] MRN: [id number] Date of Birth: February 01, 1921  Today's Date: 02/02/2012 Time: 5427-0623 OT Time Calculation (min): 50 min  Manual Therapy: 7628-3151 25' Therapeutic Exercise: 7616-0737 25' Visit#: 27  of 36   Re-eval: 02/19/12    Subjective Symptoms/Limitations Symptoms: S: I think I overdid it in therapy last time. It has been more sore, but maybe its the weather. Pain Assessment Currently in Pain?: Yes Pain Score:   3 Pain Location: Shoulder Pain Orientation: Right Pain Type: Acute pain  Precautions/Restrictions   N/A  Exercise/Treatments Supine Protraction: PROM;Strengthening;10 reps Protraction Weight (lbs): 2# Horizontal ABduction: PROM;Strengthening;10 reps Horizontal ABduction Weight (lbs): 1# External Rotation: PROM;Strengthening;10 reps External Rotation Weight (lbs): 1# Internal Rotation: PROM;Strengthening;10 reps Internal Rotation Weight (lbs): 1# Flexion: PROM;Strengthening;10 reps Shoulder Flexion Weight (lbs): 2# ABduction: PROM;Strengthening;10 reps Shoulder ABduction Weight (lbs): 1# Seated Elevation: AROM;15 reps Extension: Theraband;12 reps Theraband Level (Shoulder Extension): Level 3 (Green) Retraction: Theraband;12 reps Theraband Level (Shoulder Retraction): Level 3 (Green) Row: Theraband;12 reps Theraband Level (Shoulder Row): Level 3 (Green) Protraction: Strengthening;12 reps Protraction Weight (lbs): 1# Horizontal ABduction: Strengthening;12 reps Horizontal ABduction Weight (lbs): 1# External Rotation: Theraband;12 reps Theraband Level (Shoulder External Rotation): Level 3 (Green) Internal Rotation: Theraband;12 reps;Limitations Theraband Level (Shoulder Internal Rotation): Level 3 (Green) Internal Rotation Limitations: mod vg and pa for proper positioning Flexion: Strengthening;12 reps Flexion Weight (lbs): 1# Abduction: Strengthening;12 reps ABduction Weight (lbs):  1#   ROM / Strengthening / Isometric Strengthening UBE (Upper Arm Bike): 3' and 3' 2.0 Cybex Press: 1 plate;Other (comment) (12 reps) Cybex Row: 1 plate;Other (comment) (12 reps) Wall Wash: 1'30" with 1# weight. Attempted 2' but patient requested to stop at 1'30" "W" Arms: x10 with 1# with facilitation for proper positioning and sequencing X to V Arms: x10        Manual Therapy Manual Therapy: Myofascial release Myofascial Release: MFR and massage to right scapular, trapezius, shoulder and upper arm regions to decrease pain and restrictions and increase pain free P/AROM. Increased focus on the upper arm as patient reported tigthness and pain from that region. Performed by Leia Alf and Laverta Baltimore.  Occupational Therapy Assessment and Plan OT Assessment and Plan Clinical Impression Statement: A: Attempted 2# weight with strenghtening but patient requested to go down to 1# weight due to increased pain/tightness. Unable to tolerate full 2 minutes of wall wash with 1# weight. Increased resistance of UBE to 2.0 and patient tolerated well. OT Plan: P: Provide and educate patient with theraband HEP. Re-attempt 2# weight with all supine strengthening.   Goals Short Term Goals Time to Complete Short Term Goals: Other (comment) (6 weeks) Short Term Goal 1: Patient will be educated on a HEP. Short Term Goal 1 Progress: Met Short Term Goal 2: Pateint will increase PROM to [facility] for increased ability to dress and bathe herself. Short Term Goal 2 Progress: Met Short Term Goal 3: Patient will increase right shoulder strength to 3+/5 for increased ability to lift groceries. Short Term Goal 3 Progress: Met Short Term Goal 4: Patient will decrease pain to 4/10 when dressing and bathing. Short Term Goal 4 Progress: Met Short Term Goal 5: Patient will decrease fascial restrictions to moderate in right shoulder region. Short Term Goal 5 Progress: Met Long Term Goals Time to Complete Long Term  Goals: 12 weeks Long Term Goal 1: Patient will return to full activity with all B/IADLs, leisure, and driving. Long Term Goal 1 Progress: Progressing toward goal  Long Term Goal 2: Patient will increase right shoulder AROM to WNL for increased ability to water aerobics. Long Term Goal 2 Progress: Progressing toward goal Long Term Goal 3: Patient will increase right shoulder strength to 5/5 for increased ability to lift pots and pans when cooking. Long Term Goal 3 Progress: Progressing toward goal Long Term Goal 4: Patient will decrease fascial restrictions to mininal in her right shoulder. Long Term Goal 4 Progress: Progressing toward goal Long Term Goal 5: Patient will decrease pain in her right shoulder to 2/10 when completing daily activities. Long Term Goal 5 Progress: Progressing toward goal  Problem List Patient Active Problem List  Diagnosis  . Pain in joint, shoulder region  . Status post complete repair of rotator cuff  . Muscle weakness (generalized)    End of Session Activity Tolerance: Patient tolerated treatment well General Behavior During Session: [facility] for tasks performed Cognition: [facility] for tasks performed    Laverta Baltimore, OTS Occupational Therapy Student Note reviewed by clinical instructor and accurately reflects treatment session.  Shirlean Mylar, OTR/L  02/02/2012, 9:42 AM

## 2012-02-02 NOTE — Progress Notes (Signed)
Occupational Therapy Treatment Patient Details  Name: Pamela [name] MRN: [id number] Date of Birth: [birth year]  Today's Date: 02/02/2012 Time: 4098-1191 OT Time Calculation (min): 50 min  Manual Therapy: 4782-9562 25' Therapeutic Exercise: 1308-6578 25' Visit#: 27  of 36   Re-eval: 02/19/12    Subjective Symptoms/Limitations Symptoms: S: I think I overdid it in therapy last time. It has been more sore, but maybe its the weather. Pain Assessment Currently in Pain?: Yes Pain Score:   3 Pain Location: Shoulder Pain Orientation: Right Pain Type: Acute pain  Precautions/Restrictions   N/A  Exercise/Treatments Supine Protraction: PROM;Strengthening;10 reps Protraction Weight (lbs): 2# Horizontal ABduction: PROM;Strengthening;10 reps Horizontal ABduction Weight (lbs): 1# External Rotation: PROM;Strengthening;10 reps External Rotation Weight (lbs): 1# Internal Rotation: PROM;Strengthening;10 reps Internal Rotation Weight (lbs): 1# Flexion: PROM;Strengthening;10 reps Shoulder Flexion Weight (lbs): 2# ABduction: PROM;Strengthening;10 reps Shoulder ABduction Weight (lbs): 1# Seated Elevation: AROM;15 reps Extension: Theraband;12 reps Theraband Level (Shoulder Extension): Level 3 (Green) Retraction: Theraband;12 reps Theraband Level (Shoulder Retraction): Level 3 (Green) Row: Theraband;12 reps Theraband Level (Shoulder Row): Level 3 (Green) Protraction: Strengthening;12 reps Protraction Weight (lbs): 1# Horizontal ABduction: Strengthening;12 reps Horizontal ABduction Weight (lbs): 1# External Rotation: Theraband;12 reps Theraband Level (Shoulder External Rotation): Level 3 (Green) Internal Rotation: Theraband;12 reps;Limitations Theraband Level (Shoulder Internal Rotation): Level 3 (Green) Internal Rotation Limitations: mod vg and pa for proper positioning Flexion: Strengthening;12 reps Flexion Weight (lbs): 1# Abduction: Strengthening;12 reps ABduction Weight (lbs):  1#   ROM / Strengthening / Isometric Strengthening UBE (Upper Arm Bike): 3' and 3' 2.0 Cybex Press: 1 plate;Other (comment) (12 reps) Cybex Row: 1 plate;Other (comment) (12 reps) Wall Wash: 1'30" with 1# weight. Attempted 2' but patient requested to stop at 1'30" "W" Arms: x10 with 1# with facilitation for proper positioning and sequencing X to V Arms: x10        Manual Therapy Manual Therapy: Myofascial release Myofascial Release: MFR and massage to right scapular, trapezius, shoulder and upper arm regions to decrease pain and restrictions and increase pain free P/AROM. Increased focus on the upper arm as patient reported tigthness and pain from that region. Performed by Leia Alf and Laverta Baltimore.  Occupational Therapy Assessment and Plan OT Assessment and Plan Clinical Impression Statement: A: Attempted 2# weight with strenghtening but patient requested to go down to 1# weight due to increased pain/tightness. Unable to tolerate full 2 minutes of wall wash with 1# weight. Increased resistance of UBE to 2.0 and patient tolerated well. OT Plan: P: Provide and educate patient with theraband HEP. Re-attempt 2# weight with all supine strengthening.   Goals Short Term Goals Time to Complete Short Term Goals: Other (comment) (6 weeks) Short Term Goal 1: Patient will be educated on a HEP. Short Term Goal 1 Progress: Met Short Term Goal 2: Pateint will increase PROM to [facility] for increased ability to dress and bathe herself. Short Term Goal 2 Progress: Met Short Term Goal 3: Patient will increase right shoulder strength to 3+/5 for increased ability to lift groceries. Short Term Goal 3 Progress: Met Short Term Goal 4: Patient will decrease pain to 4/10 when dressing and bathing. Short Term Goal 4 Progress: Met Short Term Goal 5: Patient will decrease fascial restrictions to moderate in right shoulder region. Short Term Goal 5 Progress: Met Long Term Goals Time to Complete Long Term  Goals: 12 weeks Long Term Goal 1: Patient will return to full activity with all B/IADLs, leisure, and driving. Long Term Goal 1 Progress: Progressing toward goal  Long Term Goal 2: Patient will increase right shoulder AROM to WNL for increased ability to water aerobics. Long Term Goal 2 Progress: Progressing toward goal Long Term Goal 3: Patient will increase right shoulder strength to 5/5 for increased ability to lift pots and pans when cooking. Long Term Goal 3 Progress: Progressing toward goal Long Term Goal 4: Patient will decrease fascial restrictions to mininal in her right shoulder. Long Term Goal 4 Progress: Progressing toward goal Long Term Goal 5: Patient will decrease pain in her right shoulder to 2/10 when completing daily activities. Long Term Goal 5 Progress: Progressing toward goal  Problem List Patient Active Problem List  Diagnosis  . Pain in joint, shoulder region  . Status post complete repair of rotator cuff  . Muscle weakness (generalized)    End of Session Activity Tolerance: Patient tolerated treatment well General Behavior During Session: [facility] for tasks performed Cognition: [facility] for tasks performed    Laverta Baltimore, OTS Occupational Therapy Student  02/02/2012, 9:42 AM

## 2012-02-05 ENCOUNTER — Ambulatory Visit (HOSPITAL_COMMUNITY)
Admission: RE | Admit: 2012-02-05 | Discharge: 2012-02-05 | Disposition: A | Discharge status: Home / Self Care | Payer: Medicare Other | Source: Ambulatory Visit | Attending: Family Medicine | Admitting: Family Medicine

## 2012-02-05 DIAGNOSIS — Z9889 Other specified postprocedural states: Secondary | ICD-10-CM

## 2012-02-05 DIAGNOSIS — M25519 Pain in unspecified shoulder: Secondary | ICD-10-CM

## 2012-02-05 DIAGNOSIS — M6281 Muscle weakness (generalized): Secondary | ICD-10-CM

## 2012-02-05 NOTE — Progress Notes (Signed)
Occupational Therapy Treatment Patient Details  Name: Pamela [name] MRN: [id number] Date of Birth: [birth year]  Today's Date: 02/05/2012 Time: 2130-8657 OT Time Calculation (min): 66 min Manual Therapy [phone] 19' Therapeutic Exercise [phone] 30' Ice pack [phone] 15'  Visit#: 28  of 36   Re-eval: 02/19/12   Subjective Symptoms/Limitations Symptoms: S:  I have hurt since last session, both my shoulders and my legs, I wonder if it is all this rain.  Precautions/Restrictions     Exercise/Treatments Supine Protraction: PROM;Strengthening;15 reps Protraction Weight (lbs): 1# Horizontal ABduction: PROM;Strengthening;15 reps Horizontal ABduction Weight (lbs): 1# External Rotation: PROM;Strengthening;15 reps External Rotation Weight (lbs): 1# Internal Rotation: PROM;Strengthening;15 reps Internal Rotation Weight (lbs): 1# Flexion: PROM;Strengthening;15 reps Shoulder Flexion Weight (lbs): 1# ABduction: PROM;Strengthening;15 reps Shoulder ABduction Weight (lbs): 1# Seated Extension: Theraband;15 reps Theraband Level (Shoulder Extension): Level 3 (Green) Retraction: Theraband;15 reps Theraband Level (Shoulder Retraction): Level 3 (Green) Row: Theraband;15 reps Theraband Level (Shoulder Row): Level 3 (Green) Protraction: Strengthening;15 reps Protraction Weight (lbs): 1# Horizontal ABduction: Strengthening;15 reps Horizontal ABduction Weight (lbs): 1# External Rotation: Theraband;15 reps Theraband Level (Shoulder External Rotation): Level 3 (Green) External Rotation Weight (lbs): 1 Internal Rotation: Theraband;Limitations;15 reps Theraband Level (Shoulder Internal Rotation): Level 3 (Green) Internal Rotation Weight (lbs): 1 Flexion: Strengthening;15 reps Flexion Weight (lbs): 1# Abduction: Strengthening;15 reps ABduction Weight (lbs): 1# ROM / Strengthening / Isometric Strengthening UBE (Upper Arm Bike): 3' and 3' 2.0 Cybex Press: 1 plate;15 reps Cybex Row: 1 plate;15  reps Wall Wash: 3' with 1# "W" Arms: x10  X to V Arms: x10     Manual Therapy Manual Therapy: Myofascial release Myofascial Release: MFR and massage to right scapular, trapezius, shoulder and upper arm regions to decrease pain and restrictions and increase pain free P/AROM. Increased focus on the upper arm as patient reported tigthness and pain from that region. Cryotherapy Number Minutes Cryotherapy: 15 Minutes Cryotherapy Location: Upper arm Type of Cryotherapy: Ice pack  Occupational Therapy Assessment and Plan OT Assessment and Plan Clinical Impression Statement: A:  Patient requested not to increase to 2# in supine today because of pain.  Patient did however complete 3' of wall wash with 1# and tolerated all ex today.  Added tband to hep. Rehab Potential: Excellent OT Plan: P:  Increase plates with cybex to continue to increase strength and follow up on tband HEP then d/c tband from program if patient is I with HEP.   Goals Short Term Goals Time to Complete Short Term Goals: Other (comment) (6 weeks) Short Term Goal 1: Patient will be educated on a HEP. Short Term Goal 2: Pateint will increase PROM to [facility] for increased ability to dress and bathe herself. Short Term Goal 3: Patient will increase right shoulder strength to 3+/5 for increased ability to lift groceries. Short Term Goal 4: Patient will decrease pain to 4/10 when dressing and bathing. Short Term Goal 5: Patient will decrease fascial restrictions to moderate in right shoulder region. Long Term Goals Time to Complete Long Term Goals: 12 weeks Long Term Goal 1: Patient will return to full activity with all B/IADLs, leisure, and driving. Long Term Goal 2: Patient will increase right shoulder AROM to WNL for increased ability to water aerobics. Long Term Goal 3: Patient will increase right shoulder strength to 5/5 for increased ability to lift pots and pans when cooking. Long Term Goal 4: Patient will decrease fascial  restrictions to mininal in her right shoulder. Long Term Goal 5: Patient will decrease pain in her  right shoulder to 2/10 when completing daily activities.  Problem List Patient Active Problem List  Diagnosis  . Pain in joint, shoulder region  . Status post complete repair of rotator cuff  . Muscle weakness (generalized)    End of Session Activity Tolerance: Patient tolerated treatment well General Behavior During Session: [facility] for tasks performed Cognition: [facility] for tasks performed  GO    02/05/2012, 10:01 [place] L. Noralee Stain, COTA/L

## 2012-02-05 NOTE — Progress Notes (Signed)
Physical Therapy Treatment Patient Details  Name: Pamela [name] MRN: [id number] Date of Birth: Dec 05, 1920  Today's Date: 02/05/2012 Time: 4098-1191 PT Time Calculation (min): 42 min  Visit#: 11  of 12   Re-eval: 02/24/12 Charges: NMR x 35'  Authorization: [employer]   Subjective: Symptoms/Limitations Symptoms: Pt states she had achiness in her legs last night. Pain Assessment Currently in Pain?: No/denies   Exercise/Treatments  Standing SLS: Eyes open;3 reps;Limitations SLS Limitations: 5" max B Wall Bumps: Shoulder;Hips;10 reps Balance Beam: tandem; side step w/squat 2RT each Marching: 10 reps Heel Raises: 10 reps Sit to Stand: Standard surface;Limitations;Other (comment) Sit to Stand Limitations: x 10R/L  Seated Other Seated Exercises: Bike at 3.0 x 6'    Physical Therapy Assessment and Plan PT Assessment and Plan Clinical Impression Statement: Pt preseents with decreased difficulty with wall bumps this session. Pt appears to be less apprehensive about balance activities. Pt is without complaint throughout session.` PT Plan: Reassess next session.     Problem List Patient Active Problem List  Diagnosis  . Pain in joint, shoulder region  . Status post complete repair of rotator cuff  . Muscle weakness (generalized)    PT - End of Session Activity Tolerance: Patient tolerated treatment well General Behavior During Session: [facility] for tasks performed Cognition: [facility] for tasks performed  Seth Bake, PTA 02/05/2012, 2:45 PM

## 2012-02-06 ENCOUNTER — Other Ambulatory Visit (HOSPITAL_COMMUNITY): Payer: Self-pay | Admitting: Family Medicine

## 2012-02-06 DIAGNOSIS — Z139 Encounter for screening, unspecified: Secondary | ICD-10-CM

## 2012-02-07 ENCOUNTER — Ambulatory Visit (HOSPITAL_COMMUNITY)
Admission: RE | Admit: 2012-02-07 | Discharge: 2012-02-07 | Disposition: A | Discharge status: Home / Self Care | Payer: Medicare Other | Source: Ambulatory Visit | Attending: Family Medicine | Admitting: Family Medicine

## 2012-02-07 ENCOUNTER — Ambulatory Visit (HOSPITAL_COMMUNITY): Payer: Medicare Other | Admitting: Physical Therapy

## 2012-02-07 DIAGNOSIS — M25519 Pain in unspecified shoulder: Secondary | ICD-10-CM

## 2012-02-07 DIAGNOSIS — M6281 Muscle weakness (generalized): Secondary | ICD-10-CM

## 2012-02-07 DIAGNOSIS — Z9889 Other specified postprocedural states: Secondary | ICD-10-CM

## 2012-02-07 NOTE — Progress Notes (Signed)
Occupational Therapy Treatment Patient Details  Name: Pamela [name] MRN: [id number] Date of Birth: 1921-03-08  Today's Date: 02/07/2012 Time: 4696-2952 OT Time Calculation (min): 43 min Manual Therapy [phone] 18' Therapeutic Exercise [phone] 24'  Visit#: 29  of 36   Re-eval: 02/19/12 Assessment Diagnosis: S/P R RCR   Subjective Symptoms/Limitations Symptoms: S:  I have hurt all night.  My bicep is really sore. Pain Assessment Currently in Pain?: Yes Pain Score:   7 Pain Location: Other (Comment) (bicep area) Pain Orientation: Right  Precautions/Restrictions     Exercise/Treatments Supine Protraction: PROM;Strengthening;15 reps Protraction Weight (lbs): 1# Horizontal ABduction: PROM;Strengthening;15 reps Horizontal ABduction Weight (lbs): 1# External Rotation: PROM;Strengthening;15 reps External Rotation Weight (lbs): 1# Internal Rotation: PROM;Strengthening;15 reps Internal Rotation Weight (lbs): 1# Flexion: PROM;Strengthening;15 reps Shoulder Flexion Weight (lbs): 1# ABduction: PROM;Strengthening;15 reps Shoulder ABduction Weight (lbs): 1# Seated Extension:  (review next session then d/c to hep) Protraction: Strengthening;15 reps Protraction Weight (lbs): 1# Horizontal ABduction: Strengthening;15 reps Horizontal ABduction Weight (lbs): 1# External Rotation: Theraband;15 reps Theraband Level (Shoulder External Rotation): Level 3 (Green) External Rotation Weight (lbs): 1 Internal Rotation: Theraband;Limitations;15 reps Theraband Level (Shoulder Internal Rotation): Level 3 (Green) Internal Rotation Weight (lbs): 1 Flexion: Strengthening;15 reps Flexion Weight (lbs): 1# Abduction: Strengthening;15 reps ABduction Weight (lbs): 1# ROM / Strengthening / Isometric Strengthening UBE (Upper Arm Bike): 3' and 3' 2.0 Cybex Press: 1.5 plate;15 reps Cybex Row: 1.5 plate;15 reps Wall Wash: 3'.25"' with 1# "W" Arms: x10  X to V Arms: x10          Manual  Therapy Manual Therapy: Myofascial release Myofascial Release: MFR and massage to right scapular, trapezius, shoulder and upper arm regions to decrease pain and restrictions and increase pain free P/AROM. Increased focus on the upper arm as patient reported tigthness and pain from that region.  Occupational Therapy Assessment and Plan OT Assessment and Plan Clinical Impression Statement: A:  Patient continues to want to keep weight at 1# secondary to pain.  Increased wall wash time by 25" and cybex by 1/2 plate and UBE up to 2.5.  Patient's main area of pain is in bicep which may be related to her compensatory patterns with strengthening.  Constant cues to keep elbow straight  with  strengthening to prevent compensatory patterns. OT Plan: P: Since patient does not want to increase weight attempt to increase reps.   Goals Short Term Goals Time to Complete Short Term Goals: Other (comment) (6 weeks) Short Term Goal 1: Patient will be educated on a HEP. Short Term Goal 2: Pateint will increase PROM to [facility] for increased ability to dress and bathe herself. Short Term Goal 3: Patient will increase right shoulder strength to 3+/5 for increased ability to lift groceries. Short Term Goal 4: Patient will decrease pain to 4/10 when dressing and bathing. Short Term Goal 5: Patient will decrease fascial restrictions to moderate in right shoulder region. Long Term Goals Time to Complete Long Term Goals: 12 weeks Long Term Goal 1: Patient will return to full activity with all B/IADLs, leisure, and driving. Long Term Goal 2: Patient will increase right shoulder AROM to WNL for increased ability to water aerobics. Long Term Goal 3: Patient will increase right shoulder strength to 5/5 for increased ability to lift pots and pans when cooking. Long Term Goal 4: Patient will decrease fascial restrictions to mininal in her right shoulder. Long Term Goal 5: Patient will decrease pain in her right shoulder to 2/10  when completing daily activities.  Problem List Patient Active Problem List  Diagnosis  . Pain in joint, shoulder region  . Status post complete repair of rotator cuff  . Muscle weakness (generalized)    End of Session Activity Tolerance: Patient tolerated treatment well General Behavior During Session: [facility] for tasks performed Cognition: [facility] for tasks performed  GO    Noralee Stain, Luken Shadowens L 02/07/2012, 9:38 AM

## 2012-02-09 ENCOUNTER — Ambulatory Visit (HOSPITAL_COMMUNITY)
Admission: RE | Admit: 2012-02-09 | Discharge: 2012-02-09 | Disposition: A | Discharge status: Home / Self Care | Payer: Medicare Other | Source: Ambulatory Visit | Attending: Occupational Therapy | Admitting: Occupational Therapy

## 2012-02-09 ENCOUNTER — Other Ambulatory Visit (HOSPITAL_COMMUNITY): Payer: Medicare Other

## 2012-02-09 ENCOUNTER — Ambulatory Visit (HOSPITAL_COMMUNITY): Payer: Medicare Other | Admitting: Physical Therapy

## 2012-02-09 DIAGNOSIS — M6281 Muscle weakness (generalized): Secondary | ICD-10-CM

## 2012-02-09 DIAGNOSIS — Z9889 Other specified postprocedural states: Secondary | ICD-10-CM

## 2012-02-09 DIAGNOSIS — M25519 Pain in unspecified shoulder: Secondary | ICD-10-CM

## 2012-02-09 NOTE — Progress Notes (Signed)
Occupational Therapy Treatment Patient Details  Name: LAKEYIA SURBER MRN: [id number] Date of Birth: Apr 02, 1921  Today's Date: 02/09/2012 Time: 3086-5784 OT Time Calculation (min): 37 min  Manual Therapy: 6962-9528 20' Therapeutic Exercise: 4132-4401 17' Visit#: 30  of 36   Re-eval: 02/19/12    Subjective Symptoms/Limitations Symptoms: I need to leave early today. My cleaning lady is coming. I will do my home exercises this afternoon. Pain Assessment Currently in Pain?: Yes Pain Score:   4 Pain Location: Shoulder Pain Orientation: Right Pain Type: Acute pain Pain Radiating Towards: Anterior shoulder into bicep  Precautions/Restrictions   N/A  Exercise/Treatments Supine Protraction: PROM;Strengthening;15 reps Protraction Weight (lbs): 1# Horizontal ABduction: PROM;Strengthening;15 reps Horizontal ABduction Weight (lbs): 1# External Rotation: PROM;Strengthening;15 reps External Rotation Weight (lbs): 1# Internal Rotation: PROM;Strengthening;15 reps Internal Rotation Weight (lbs): 1# Flexion: PROM;Strengthening;15 reps Shoulder Flexion Weight (lbs): 1# ABduction: PROM;Strengthening;15 reps Shoulder ABduction Weight (lbs): 1# Seated Protraction: Strengthening;15 reps Protraction Weight (lbs): 1# Horizontal ABduction: Strengthening;15 reps Horizontal ABduction Weight (lbs): 1# External Rotation: Strengthening;15 reps External Rotation Weight (lbs): 1 Internal Rotation: AAROM;15 reps Internal Rotation Weight (lbs): 1 Flexion: Strengthening;15 reps Flexion Weight (lbs): 1# Abduction: Strengthening;15 reps ABduction Weight (lbs): 1#   ROM / Strengthening / Isometric Strengthening UBE (Upper Arm Bike): resume next visit Cybex Press: Other (comment) (resume next visit) Cybex Row: Other (comment) (resume next visit) Wall Wash: 2' with 1# (Pt reported discomfort and fatigue after 1'30") "W" Arms: x10  X to V Arms: x10        Manual Therapy Manual Therapy:  Myofascial release Myofascial Release: MFR and massage to right scapular, trapezius, shoulder and upper arm region to decrease pain and restrictions and increase pain free P/AROM. Increased focus on the anterior shoulder and upper arm as patient reported tigthness and pain from that region.  Occupational Therapy Assessment and Plan OT Assessment and Plan Clinical Impression Statement: A: Patient again requested to keep weight at 1# secondary to increased soreness. Held many exercises today as patient needed to leave early due to another appointment. Increased tightness in trapezius, and decrease tigthness in bicep regions.  OT Plan: P: Resume exercises missed this visit due to patient's time constraint. Encourage 2# weight in supine or increase reps of supine and seated strengthening if patient does not want to increase weight.     Goals Short Term Goals Time to Complete Short Term Goals: Other (comment) (6 weeks) Short Term Goal 1: Patient will be educated on a HEP. Short Term Goal 1 Progress: Met Short Term Goal 2: Pateint will increase PROM to [facility] for increased ability to dress and bathe herself. Short Term Goal 2 Progress: Met Short Term Goal 3: Patient will increase right shoulder strength to 3+/5 for increased ability to lift groceries. Short Term Goal 3 Progress: Met Short Term Goal 4: Patient will decrease pain to 4/10 when dressing and bathing. Short Term Goal 4 Progress: Met Short Term Goal 5: Patient will decrease fascial restrictions to moderate in right shoulder region. Short Term Goal 5 Progress: Met Long Term Goals Time to Complete Long Term Goals: 12 weeks Long Term Goal 1: Patient will return to full activity with all B/IADLs, leisure, and driving. Long Term Goal 1 Progress: Progressing toward goal Long Term Goal 2: Patient will increase right shoulder AROM to WNL for increased ability to water aerobics. Long Term Goal 2 Progress: Progressing toward goal Long Term Goal 3:  Patient will increase right shoulder strength to 5/5 for increased ability to lift  pots and pans when cooking. Long Term Goal 3 Progress: Progressing toward goal Long Term Goal 4: Patient will decrease fascial restrictions to mininal in her right shoulder. Long Term Goal 4 Progress: Progressing toward goal Long Term Goal 5: Patient will decrease pain in her right shoulder to 2/10 when completing daily activities. Long Term Goal 5 Progress: Progressing toward goal  Problem List Patient Active Problem List  Diagnosis  . Pain in joint, shoulder region  . Status post complete repair of rotator cuff  . Muscle weakness (generalized)    End of Session Activity Tolerance: Patient tolerated treatment well General Behavior During Session: [facility] for tasks performed Cognition: [facility] for tasks performed  GO   Laverta Baltimore, OTS Occupational Therapy Student  02/09/2012, 9:32 AM

## 2012-02-09 NOTE — Progress Notes (Signed)
Note reviewed by clinical instructor and accurately reflects treatment session.  Jarick Harkins L. Simeon Vera, COTA/L  

## 2012-02-12 ENCOUNTER — Ambulatory Visit (HOSPITAL_COMMUNITY)
Admission: RE | Admit: 2012-02-12 | Discharge: 2012-02-12 | Disposition: A | Discharge status: Home / Self Care | Payer: Medicare Other | Source: Ambulatory Visit | Attending: Family Medicine | Admitting: Family Medicine

## 2012-02-12 ENCOUNTER — Ambulatory Visit (HOSPITAL_COMMUNITY)
Admission: RE | Admit: 2012-02-12 | Discharge: 2012-02-12 | Disposition: A | Discharge status: Home / Self Care | Payer: Medicare Other | Source: Ambulatory Visit | Attending: Physical Therapy | Admitting: Physical Therapy

## 2012-02-12 DIAGNOSIS — M6281 Muscle weakness (generalized): Secondary | ICD-10-CM

## 2012-02-12 DIAGNOSIS — M25519 Pain in unspecified shoulder: Secondary | ICD-10-CM

## 2012-02-12 DIAGNOSIS — Z9889 Other specified postprocedural states: Secondary | ICD-10-CM

## 2012-02-12 NOTE — Evaluation (Signed)
Physical Therapy Re-Evaluation  Patient Details  Name: Pamela [name] MRN: [id number] Date of Birth: [birth year]  Today's Date: 02/12/2012 Time: 1430-1510 PT Time Calculation (min): 40 min  Visit#: 12  of 12     Past Medical History: No past medical history on file. Past Surgical History: No past surgical history on file.  Subjective Symptoms/Limitations Symptoms: Pt states she feels she is doing better. How long can you stand comfortably?: The patient states that she is able to stand for 40 minutes to make an indepth meal she was only able to stand for 20 minutes. How long can you walk comfortably?: The patient states she has walked for an hour; she was walking for 30 minutes. Pain Assessment Currently in Pain?: No/denies      Assessment Mm tests compared to June re-eval.  Initial evaluation in May showed the majority of mm strength at 2/5. RLE Strength Right Hip Flexion: 4/5 (was 3/5) Right Hip Extension: 4/5 (was 3-/5) Right Hip ABduction: 4/5 (was 3+/5) Right Hip ADduction: 5/5 (was 3/5) Right Knee Flexion: 4/5 (was 3+/5) Right Knee Extension:  (4+/5 was 3+) Right Ankle Dorsiflexion: 5/5 Right Ankle Plantar Flexion: 5/5 LLE Strength Left Hip Flexion: 3+/5 (was 3/5) Left Hip Extension: 4/5 (4/5 was 3-/5) Left Hip ABduction: 3+/5 (was 3/5) Left Hip ADduction: 4/5 (was 3/5) Left Knee Flexion: 5/5 (was 3+/5) Left Knee Extension:  (4+/5 was 3+5) Left Ankle Dorsiflexion: 5/5 Left Ankle Plantar Flexion: 5/5  Exercise/Treatments Mobility/Balance  Berg Balance Test Sit to Stand: Able to stand without using hands and stabilize independently Standing Unsupported: Able to stand safely 2 minutes Sitting with Back Unsupported but Feet Supported on Floor or Stool: Able to sit safely and securely 2 minutes Stand to Sit: Sits safely with minimal use of hands Transfers: Able to transfer safely, minor use of hands Standing Unsupported with Eyes Closed: Able to stand 10 seconds  safely Standing Ubsupported with Feet Together: Able to place feet together independently and stand 1 minute safely From Standing, Reach Forward with Outstretched Arm: Can reach confidently >25 cm (10") From Standing Position, Pick up Object from Floor: Able to pick up shoe safely and easily From Standing Position, Turn to Look Behind Over each Shoulder: Looks behind one side only/other side shows less weight shift Turn 360 Degrees: Able to turn 360 degrees safely in 4 seconds or less Standing Unsupported, Alternately Place Feet on Step/Stool: Able to stand independently and safely and complete 8 steps in 20 seconds Standing Unsupported, One Foot in Front: Able to plae foot ahead of the other independently and hold 30 seconds Standing on One Leg: Able to lift leg independently and hold equal to or more than 3 seconds Total Score: 52  May score was 42; June score was at 46    Physical Therapy Assessment and Plan    Discharge pt to HEP  Goals Home Exercise Program PT Goal: Perform Home Exercise Program - Progress: Met PT Short Term Goals PT Short Term Goal 1 - Progress: Met PT Short Term Goal 2 - Progress: Met PT Long Term Goals PT Long Term Goal 1 - Progress: Met PT Long Term Goal 2 - Progress: Met Long Term Goal 3 Progress: Met  Problem List Patient Active Problem List  Diagnosis  . Pain in joint, shoulder region  . Status post complete repair of rotator cuff  . Muscle weakness (generalized)    PT - End of Session Activity Tolerance: Patient tolerated treatment well  GP  RUSSELL,CINDY 02/12/2012, 3:18 PM  Physician Documentation Your signature is required to indicate approval of the treatment plan as stated above.  Please sign and either send electronically or make a copy of this report for your files and return this physician signed original.   Please mark one 1.__approve of plan  2. ___approve of plan with the following conditions.   ______________________________                                                           _____________________ Physician Signature                                                                                                             Date

## 2012-02-12 NOTE — Progress Notes (Signed)
Note reviewed by clinical instructor and accurately reflects treatment session.  Micharl Helmes H. Sadik Piascik, OTR/L  

## 2012-02-12 NOTE — Progress Notes (Signed)
Occupational Therapy Treatment Patient Details  Name: Pamela [name] MRN: [id number] Date of Birth: August 04, 1920  Today's Date: 02/12/2012 Time: 4098-1191 OT Time Calculation (min): 54 min  Manual Therapy: 4782-9562 24' Therapeutic Exercise: 1308-6578 30' Visit#: 31  of 36   Re-eval: 02/19/12    Subjective Symptoms/Limitations Symptoms: S: I did my theraband exercises and I think that is what made it sore today. I want to be able to fasten my bra in the back. Pain Assessment Currently in Pain?: Yes Pain Score:   3 Pain Location: Shoulder Pain Orientation: Right Pain Type: Acute pain Pain Radiating Towards: anterior shoulder and into bicep  Precautions/Restrictions   N/A  Exercise/Treatments Supine Protraction: PROM;Strengthening;15 reps Protraction Weight (lbs): 1# Horizontal ABduction: PROM;Strengthening;15 reps Horizontal ABduction Weight (lbs): 1# External Rotation: PROM;Strengthening;15 reps External Rotation Weight (lbs): 1# Internal Rotation: PROM;Strengthening;15 reps Internal Rotation Weight (lbs): 1# Flexion: PROM;Strengthening;15 reps Shoulder Flexion Weight (lbs): 1# ABduction: PROM;Strengthening;15 reps Shoulder ABduction Weight (lbs): 1# Seated Protraction: Strengthening;15 reps Protraction Weight (lbs): 1# Horizontal ABduction: Strengthening;15 reps Horizontal ABduction Weight (lbs): 1# External Rotation: Strengthening;15 reps External Rotation Weight (lbs): 1 Internal Rotation: AAROM;15 reps Internal Rotation Weight (lbs): 1 Flexion: Strengthening;15 reps Flexion Weight (lbs): 1# Abduction: Strengthening;15 reps ABduction Weight (lbs): 1# Other Seated Exercises: reached for back of head, then to lower back with LUE x10. Minimal facilitation required to keep shoulder depressed/relaxed Other Seated Exercises: shoulder abducted and internally rotated until end feel. Hold that position and flex/extend elbow to work on the fluidity of all 3 movements  combined x 10 ROM / Strengthening / Isometric Strengthening UBE (Upper Arm Bike): 3' and 3' @ 2.0 Cybex Press: 1.5 plate;Other (comment) (12 reps) Cybex Row: 1.5 plate;Other (comment) (12 reps) Wall Wash: 2' with 1# "W" Arms: 5 then 5 with 1# X to V Arms: x10 with 1#        Manual Therapy Manual Therapy: Myofascial release Myofascial Release: MFR and massage to right scapular, trapezius, shoulder and upper arm region to decrease pain and restrictions and increase pain free P/AROM. Increased focus on anterior shoulder and upper arm as that is where the most restrictions are present.   Occupational Therapy Assessment and Plan OT Assessment and Plan Clinical Impression Statement: A: Added exercises where patient reaches to back of head, to then low-mid back and combined shoulder abduction, int rot/elbow flex,ext to increase patient's ability to fasten her bra. Patient reports most of her pain and tigthness from the bicep region. Provided tactile input and minimal pa during seated strengthening exercises to ensure proper form and prevent compensation by the bicep muscle. Patient reports increased ease with cybex and arm bike. OT Plan: P: Encourage patient to increase supine strengthening to 2# as she reports she wants to build strength. Continue exercises to increase patient's functional external and internal rotation.   Goals Short Term Goals Time to Complete Short Term Goals: Other (comment) (6 weeks) Short Term Goal 1: Patient will be educated on a HEP. Short Term Goal 1 Progress: Met Short Term Goal 2: Pateint will increase PROM to [facility] for increased ability to dress and bathe herself. Short Term Goal 2 Progress: Met Short Term Goal 3: Patient will increase right shoulder strength to 3+/5 for increased ability to lift groceries. Short Term Goal 3 Progress: Met Short Term Goal 4: Patient will decrease pain to 4/10 when dressing and bathing. Short Term Goal 4 Progress: Met Short Term Goal  5: Patient will decrease fascial restrictions to moderate in right shoulder  region. Short Term Goal 5 Progress: Met Long Term Goals Time to Complete Long Term Goals: 12 weeks Long Term Goal 1: Patient will return to full activity with all B/IADLs, leisure, and driving. Long Term Goal 1 Progress: Progressing toward goal Long Term Goal 2: Patient will increase right shoulder AROM to WNL for increased ability to water aerobics. Long Term Goal 2 Progress: Progressing toward goal Long Term Goal 3: Patient will increase right shoulder strength to 5/5 for increased ability to lift pots and pans when cooking. Long Term Goal 3 Progress: Progressing toward goal Long Term Goal 4: Patient will decrease fascial restrictions to mininal in her right shoulder. Long Term Goal 4 Progress: Progressing toward goal Long Term Goal 5: Patient will decrease pain in her right shoulder to 2/10 when completing daily activities. Long Term Goal 5 Progress: Progressing toward goal  Problem List Patient Active Problem List  Diagnosis  . Pain in joint, shoulder region  . Status post complete repair of rotator cuff  . Muscle weakness (generalized)    End of Session Activity Tolerance: Patient tolerated treatment well General Behavior During Session: [facility] for tasks performed Cognition: [facility] for tasks performed  GO   Laverta Baltimore, OTS Occupational Therapy Student  02/12/2012, 9:52 AM

## 2012-02-13 ENCOUNTER — Other Ambulatory Visit (HOSPITAL_COMMUNITY): Payer: Medicare Other

## 2012-02-14 ENCOUNTER — Ambulatory Visit (HOSPITAL_COMMUNITY): Payer: Medicare Other

## 2012-02-14 ENCOUNTER — Ambulatory Visit (HOSPITAL_COMMUNITY): Payer: Medicare Other | Admitting: *Deleted

## 2012-02-14 ENCOUNTER — Ambulatory Visit (HOSPITAL_COMMUNITY)
Admission: RE | Admit: 2012-02-14 | Discharge: 2012-02-14 | Disposition: A | Discharge status: Home / Self Care | Payer: Medicare Other | Source: Ambulatory Visit | Attending: Family Medicine | Admitting: Family Medicine

## 2012-02-14 DIAGNOSIS — M25519 Pain in unspecified shoulder: Secondary | ICD-10-CM

## 2012-02-14 DIAGNOSIS — Z9889 Other specified postprocedural states: Secondary | ICD-10-CM

## 2012-02-14 DIAGNOSIS — M6281 Muscle weakness (generalized): Secondary | ICD-10-CM

## 2012-02-14 NOTE — Progress Notes (Signed)
Occupational Therapy Treatment Patient Details  Name: Pamela [name] MRN: [id number] Date of Birth: 04-12-1921  Today's Date: 02/14/2012 Time: 9147-8295 OT Time Calculation (min): 42 min Manual Therapy 854-913 19' Therapeutic Exercise 913-936 23' Visit#: 32  of 36   Re-eval: 02/19/12 Assessment Diagnosis: S/P R RCR  Subjective Symptoms/Limitations Symptoms: S:  I had to get up last night and put ice on it. Pain Assessment Currently in Pain?: Yes Pain Score:   6 Pain Orientation: Right Pain Type: Acute pain  Precautions/Restrictions     Exercise/Treatments Supine Protraction: PROM;Strengthening;Other (comment) Protraction Weight (lbs): 1# Horizontal ABduction: PROM;Strengthening;Other (comment) Horizontal ABduction Weight (lbs): 1# External Rotation: PROM;Strengthening;Other (comment) External Rotation Weight (lbs): 1# Internal Rotation: PROM;Strengthening;Other (comment) Internal Rotation Weight (lbs): 1# Flexion: PROM;Strengthening;Other (comment) Shoulder Flexion Weight (lbs): 1# ABduction: PROM;Strengthening;Other (comment) Shoulder ABduction Weight (lbs): 1# Seated Protraction: Strengthening;Other (comment) (18 reps) Protraction Weight (lbs): 1# Horizontal ABduction: Strengthening;Other (comment) (18 reps) Horizontal ABduction Weight (lbs): 1# External Rotation: Strengthening;Other (comment) (18 reps) External Rotation Weight (lbs): 1# Internal Rotation: Strengthening;Other (comment) (18 reps) Internal Rotation Weight (lbs): 1# Flexion: Strengthening;Other (comment);Limitations (18 reps) Flexion Weight (lbs): 1# Flexion Limitations: pa and vc for proper position while strengthening. Abduction: Strengthening;15 reps ABduction Weight (lbs): 1# Other Seated Exercises: reached for back of head, then to lower back with LUE x10. Minimal facilitation required to keep shoulder depressed/relaxed Other Seated Exercises: shoulder abducted and internally rotated until  end feel. Hold that position and flex/extend elbow to work on the fluidity of all 3 movements combined x 10 ROM / Strengthening / Isometric Strengthening UBE (Upper Arm Bike): 3' and 3' @ 2.0 Cybex Press: 1.5 plate;15 reps Cybex Row: 1.5 plate;15 reps Wall Wash: time "W" Arms: time X to V Arms: time     Manual Therapy Manual Therapy: Myofascial release Myofascial Release: MFR and massage to right scapular, trapezius, shoulder and upper arm region to decrease pain and restrictions and increase pain free P/AROM. Increased focus on anterior shoulder and upper arm as that is where the most restrictions are present  Occupational Therapy Assessment and Plan OT Assessment and Plan Clinical Impression Statement: A:  Encouraged patient to ice before bed to see if she can get throught the night without waking because of pain.  Patient is getting up in middle of the night to ice when her arm starts hurting.  Cues for good position with seated exercises.  Attempted to get patient to increase to 2# with supine exercises but patient did not want to so increased reps.  Attempted to increase wt on cybex press, patient unable to tolerate increased weight. Rehab Potential: Excellent OT Plan: P:  Resume missed exercises and increase to 20 reps with seated and supine since patient does not want to increase to 2#.   Goals Short Term Goals Time to Complete Short Term Goals: Other (comment) (6 weeks) Short Term Goal 1: Patient will be educated on a HEP. Short Term Goal 2: Pateint will increase PROM to [facility] for increased ability to dress and bathe herself. Short Term Goal 3: Patient will increase right shoulder strength to 3+/5 for increased ability to lift groceries. Short Term Goal 4: Patient will decrease pain to 4/10 when dressing and bathing. Short Term Goal 5: Patient will decrease fascial restrictions to moderate in right shoulder region. Long Term Goals Time to Complete Long Term Goals: 12 weeks Long  Term Goal 1: Patient will return to full activity with all B/IADLs, leisure, and driving. Long Term Goal 2: Patient  will increase right shoulder AROM to WNL for increased ability to water aerobics. Long Term Goal 3: Patient will increase right shoulder strength to 5/5 for increased ability to lift pots and pans when cooking. Long Term Goal 4: Patient will decrease fascial restrictions to mininal in her right shoulder. Long Term Goal 5: Patient will decrease pain in her right shoulder to 2/10 when completing daily activities.  Problem List Patient Active Problem List  Diagnosis  . Pain in joint, shoulder region  . Status post complete repair of rotator cuff  . Muscle weakness (generalized)    End of Session Activity Tolerance: Patient tolerated treatment well General Behavior During Session: [facility] for tasks performed Cognition: [facility] for tasks performed  GO    Noralee Stain, Treyveon Mochizuki L 02/14/2012, 12:20 PM

## 2012-02-16 ENCOUNTER — Ambulatory Visit (HOSPITAL_COMMUNITY): Payer: Medicare Other | Admitting: Physical Therapy

## 2012-02-16 ENCOUNTER — Ambulatory Visit (HOSPITAL_COMMUNITY)
Admission: RE | Admit: 2012-02-16 | Discharge: 2012-02-16 | Disposition: A | Discharge status: Home / Self Care | Payer: Medicare Other | Source: Ambulatory Visit | Attending: Family Medicine | Admitting: Family Medicine

## 2012-02-16 NOTE — Progress Notes (Signed)
Note reviewed by clinical instructor and accurately reflects treatment session.  Bethany H. Murray, OTR/L  

## 2012-02-16 NOTE — Progress Notes (Signed)
Occupational Therapy Treatment Patient Details  Name: Pamela [name] MRN: [id number] Date of Birth: [birth year]  Today's Date: 02/16/2012 Time: 8295-6213 OT Time Calculation (min): 45 min  Manual Therapy: 0865-7846 20' Therapeutic Exercise: 9629-5284 25' Visit#: 33  of 36   Re-eval: 02/19/12    Subjective Symptoms/Limitations Symptoms: S: I used ice on it before I went to sleep 2 nights ago and had the best nights sleep i've had so far, and the best day yesterday. Pain Assessment Currently in Pain?: Yes Pain Score:   2 Pain Location: Shoulder Pain Orientation: Right Pain Type: Acute pain  Precautions/Restrictions   N/A  Exercise/Treatments Supine Protraction: PROM;Strengthening;Other (comment) (18) Protraction Weight (lbs): 1# Horizontal ABduction: PROM;Strengthening;Other (comment) (18) Horizontal ABduction Weight (lbs): 1# External Rotation: PROM;Strengthening;Other (comment) (18) External Rotation Weight (lbs): 1# Internal Rotation: PROM;Strengthening;Other (comment) (18) Internal Rotation Weight (lbs): 1# Flexion: PROM;Strengthening;Other (comment) (18) Shoulder Flexion Weight (lbs): 1# ABduction: PROM;Strengthening;Other (comment) (18) Shoulder ABduction Weight (lbs): 1# Seated Protraction: Strengthening;Other (comment) (18) Protraction Weight (lbs): 1# Horizontal ABduction: Strengthening;Other (comment) (18) Horizontal ABduction Weight (lbs): 1# External Rotation: Strengthening;Other (comment) (18) External Rotation Weight (lbs): 1# Internal Rotation: Strengthening;Other (comment) (18) Internal Rotation Weight (lbs): 1# Flexion: Strengthening;Other (comment) (18) Flexion Weight (lbs): 1# Abduction: Strengthening;Other (comment) (18) Other Seated Exercises: reached for back of head, then to lower back with LUE x12.  Other Seated Exercises: shoulder abducted and internally rotated until end feel. Hold that position and flex/extend elbow to work on the  fluidity of all 3 movements combined x 12. Minimal facilitation required to keep shoulder depressed/relaxed.   ROM / Strengthening / Isometric Strengthening UBE (Upper Arm Bike): 3' and 3' @ 2.0 Cybex Press: 1.5 plate;15 reps Cybex Row: 1.5 plate;15 reps Wall Wash: 1'30" with 1# (attempted 2' but patient requested to stop at 1'30") "W" Arms: x10 with 1#. Min fascilitation to keep scapula depressed and shoulder relaxed. X to V Arms: x10 with 1#. Min fascilitation to keep scapula depressed and shoulder relaxed        Manual Therapy Manual Therapy: Myofascial release Myofascial Release: MFR and manual stretching to right scapular, trapezius, shoulder and upper arm region to decrease pain and restrictions and increase pain free P/AROM. Decreased restrictions noted in upper arm region.  Occupational Therapy Assessment and Plan OT Assessment and Plan Clinical Impression Statement: A: Required tactile cues and pa during seated exercise to maintain proper position. 18 reps of supine and seated strengthening to provide a good challenge to patient since she is unable to tolerate an increase in weight. Patient's RUE AROM is not full, but even to LUE. OT Plan: P: Reassess and discuss patient's hesitation to increasing weights or resistance for exercises.   Goals Short Term Goals Time to Complete Short Term Goals: Other (comment) (6 weeks) Short Term Goal 1: Patient will be educated on a HEP. Short Term Goal 1 Progress: Met Short Term Goal 2: Pateint will increase PROM to [facility] for increased ability to dress and bathe herself. Short Term Goal 2 Progress: Met Short Term Goal 3: Patient will increase right shoulder strength to 3+/5 for increased ability to lift groceries. Short Term Goal 3 Progress: Met Short Term Goal 4: Patient will decrease pain to 4/10 when dressing and bathing. Short Term Goal 4 Progress: Met Short Term Goal 5: Patient will decrease fascial restrictions to moderate in right  shoulder region. Short Term Goal 5 Progress: Met Long Term Goals Time to Complete Long Term Goals: 12 weeks Long Term Goal 1: Patient  will return to full activity with all B/IADLs, leisure, and driving. Long Term Goal 1 Progress: Progressing toward goal Long Term Goal 2: Patient will increase right shoulder AROM to WNL for increased ability to water aerobics. Long Term Goal 2 Progress: Progressing toward goal Long Term Goal 3: Patient will increase right shoulder strength to 5/5 for increased ability to lift pots and pans when cooking. Long Term Goal 3 Progress: Progressing toward goal Long Term Goal 4: Patient will decrease fascial restrictions to mininal in her right shoulder. Long Term Goal 4 Progress: Progressing toward goal Long Term Goal 5: Patient will decrease pain in her right shoulder to 2/10 when completing daily activities. Long Term Goal 5 Progress: Progressing toward goal  Problem List Patient Active Problem List  Diagnosis  . Pain in joint, shoulder region  . Status post complete repair of rotator cuff  . Muscle weakness (generalized)    End of Session Activity Tolerance: Patient tolerated treatment well General Behavior During Session: [facility] for tasks performed Cognition: [facility] for tasks performed  GO   Laverta Baltimore, OTS Occupational Therapy Student  02/16/2012, 9:39 AM

## 2012-02-19 ENCOUNTER — Ambulatory Visit (HOSPITAL_COMMUNITY)
Admission: RE | Admit: 2012-02-19 | Discharge: 2012-02-19 | Disposition: A | Discharge status: Home / Self Care | Payer: Medicare Other | Source: Ambulatory Visit | Attending: Family Medicine | Admitting: Family Medicine

## 2012-02-19 ENCOUNTER — Ambulatory Visit (HOSPITAL_COMMUNITY): Payer: Medicare Other | Admitting: *Deleted

## 2012-02-19 ENCOUNTER — Ambulatory Visit (HOSPITAL_COMMUNITY)
Admission: RE | Admit: 2012-02-19 | Discharge: 2012-02-19 | Disposition: A | Discharge status: Home / Self Care | Payer: Medicare Other | Source: Ambulatory Visit | Attending: Orthopedic Surgery | Admitting: Orthopedic Surgery

## 2012-02-19 DIAGNOSIS — M25519 Pain in unspecified shoulder: Secondary | ICD-10-CM

## 2012-02-19 DIAGNOSIS — Z139 Encounter for screening, unspecified: Secondary | ICD-10-CM

## 2012-02-19 DIAGNOSIS — Z9889 Other specified postprocedural states: Secondary | ICD-10-CM

## 2012-02-19 DIAGNOSIS — M6281 Muscle weakness (generalized): Secondary | ICD-10-CM

## 2012-02-19 DIAGNOSIS — Z780 Asymptomatic menopausal state: Secondary | ICD-10-CM | POA: Insufficient documentation

## 2012-02-19 DIAGNOSIS — Z9079 Acquired absence of other genital organ(s): Secondary | ICD-10-CM | POA: Insufficient documentation

## 2012-02-19 DIAGNOSIS — Z13820 Encounter for screening for osteoporosis: Secondary | ICD-10-CM | POA: Insufficient documentation

## 2012-02-19 NOTE — Progress Notes (Signed)
Occupational Therapy Treatment Patient Details  Name: Pamela [name] MRN: [id number] Date of Birth: September 02, 1920  Today's Date: 02/19/2012 Time: 0852-0920 OT Time Calculation (min): 28 min Manual Therapy 852-910 18' Therapeutic Exercise 911-920 9'  Visit#: 34  of 36   Re-eval: 02/19/12 Assessment Diagnosis: S/P R RCR  Subjective Symptoms/Limitations Symptoms: S:  I used heat last night, it didn't do as well as the ice.  I don't want to use pain pills. Pain Assessment Currently in Pain?: Yes Pain Score:   4 Pain Location: Shoulder Pain Orientation: Right Pain Type: Acute pain  Precautions/Restrictions     Exercise/Treatments Supine Protraction: PROM;10 reps Horizontal ABduction: PROM;10 reps External Rotation: PROM;10 reps Internal Rotation: PROM;10 reps Flexion: PROM;10 reps ABduction: PROM;10 reps        Manual Therapy Manual Therapy: Myofascial release Myofascial Release: MFR and manual stretching to right scapular, trapezius, shoulder and upper arm/ bicep  region to decrease pain and restrictions and increase pain free P/AROM  Occupational Therapy Assessment and Plan OT Assessment and Plan Clinical Impression Statement: A:  See progress note.  Patient met 5/5 STG's and 4/5 LTG with the one goal being partially met. OT Plan: P;  D/C to HEP.   Goals Short Term Goals Time to Complete Short Term Goals: Other (comment) (6 weeks) Short Term Goal 1: Patient will be educated on a HEP. Short Term Goal 1 Progress: Met Short Term Goal 2: Pateint will increase PROM to [facility] for increased ability to dress and bathe herself. Short Term Goal 2 Progress: Met Short Term Goal 3: Patient will increase right shoulder strength to 3+/5 for increased ability to lift groceries. Short Term Goal 3 Progress: Met Short Term Goal 4: Patient will decrease pain to 4/10 when dressing and bathing. Short Term Goal 4 Progress: Met Short Term Goal 5: Patient will decrease fascial restrictions  to moderate in right shoulder region. Short Term Goal 5 Progress: Met Long Term Goals Time to Complete Long Term Goals: 12 weeks Long Term Goal 1: Patient will return to full activity with all B/IADLs, leisure, and driving. Long Term Goal 1 Progress: Met Long Term Goal 2: Patient will increase right shoulder AROM to WNL for increased ability to water aerobics. Long Term Goal 2 Progress: Met Long Term Goal 3: Patient will increase right shoulder strength to 5/5 for increased ability to lift pots and pans when cooking. Long Term Goal 3 Progress: Partly met Long Term Goal 4: Patient will decrease fascial restrictions to mininal in her right shoulder. Long Term Goal 4 Progress: Met Long Term Goal 5: Patient will decrease pain in her right shoulder to 2/10 when completing daily activities. Long Term Goal 5 Progress: Met  Problem List Patient Active Problem List  Diagnosis  . Pain in joint, shoulder region  . Status post complete repair of rotator cuff  . Muscle weakness (generalized)    End of Session Activity Tolerance: Patient tolerated treatment well General Behavior During Session: [facility] for tasks performed Cognition: [facility] for tasks performed  GO    Noralee Stain, Darivs Lunden L 02/19/2012, 9:31 AM

## 2012-02-21 ENCOUNTER — Ambulatory Visit (HOSPITAL_COMMUNITY): Payer: Medicare Other | Admitting: Occupational Therapy

## 2012-02-21 ENCOUNTER — Ambulatory Visit (HOSPITAL_COMMUNITY): Payer: Medicare Other | Admitting: *Deleted

## 2012-02-23 ENCOUNTER — Ambulatory Visit (HOSPITAL_COMMUNITY): Payer: Medicare Other | Admitting: Occupational Therapy

## 2012-02-23 ENCOUNTER — Ambulatory Visit (HOSPITAL_COMMUNITY): Payer: Medicare Other | Admitting: Physical Therapy

## 2012-02-26 ENCOUNTER — Ambulatory Visit (HOSPITAL_COMMUNITY): Payer: Medicare Other | Admitting: Physical Therapy

## 2012-02-26 ENCOUNTER — Ambulatory Visit (HOSPITAL_COMMUNITY): Payer: Medicare Other | Admitting: Occupational Therapy

## 2012-02-28 ENCOUNTER — Ambulatory Visit (HOSPITAL_COMMUNITY): Payer: Medicare Other | Admitting: Occupational Therapy

## 2012-02-28 ENCOUNTER — Ambulatory Visit (HOSPITAL_COMMUNITY): Payer: Medicare Other | Admitting: Physical Therapy

## 2012-03-01 ENCOUNTER — Ambulatory Visit (HOSPITAL_COMMUNITY): Payer: Medicare Other | Admitting: Occupational Therapy

## 2012-03-01 ENCOUNTER — Ambulatory Visit (HOSPITAL_COMMUNITY): Payer: Medicare Other | Admitting: Physical Therapy

## 2012-04-19 DIAGNOSIS — G250 Essential tremor: Secondary | ICD-10-CM | POA: Insufficient documentation

## 2012-04-19 DIAGNOSIS — R413 Other amnesia: Secondary | ICD-10-CM | POA: Insufficient documentation

## 2012-04-19 DIAGNOSIS — M810 Age-related osteoporosis without current pathological fracture: Secondary | ICD-10-CM | POA: Insufficient documentation

## 2012-04-19 DIAGNOSIS — R209 Unspecified disturbances of skin sensation: Secondary | ICD-10-CM | POA: Insufficient documentation

## 2012-04-19 DIAGNOSIS — G609 Hereditary and idiopathic neuropathy, unspecified: Secondary | ICD-10-CM | POA: Insufficient documentation

## 2012-04-19 DIAGNOSIS — G252 Other specified forms of tremor: Secondary | ICD-10-CM | POA: Insufficient documentation

## 2012-04-19 DIAGNOSIS — I499 Cardiac arrhythmia, unspecified: Secondary | ICD-10-CM | POA: Insufficient documentation

## 2012-04-19 DIAGNOSIS — I10 Essential (primary) hypertension: Secondary | ICD-10-CM | POA: Insufficient documentation

## 2012-04-19 DIAGNOSIS — I259 Chronic ischemic heart disease, unspecified: Secondary | ICD-10-CM | POA: Insufficient documentation

## 2012-08-07 ENCOUNTER — Other Ambulatory Visit: Payer: Self-pay | Admitting: Cardiovascular Disease

## 2012-08-15 ENCOUNTER — Encounter (HOSPITAL_COMMUNITY): Payer: Self-pay | Admitting: Pharmacy Technician

## 2012-08-23 ENCOUNTER — Other Ambulatory Visit (HOSPITAL_COMMUNITY): Payer: Self-pay | Admitting: Cardiovascular Disease

## 2012-08-23 ENCOUNTER — Ambulatory Visit (HOSPITAL_COMMUNITY)
Admission: RE | Admit: 2012-08-23 | Discharge: 2012-08-23 | Disposition: A | Discharge status: Home / Self Care | Payer: Medicare Other | Source: Ambulatory Visit | Attending: Cardiovascular Disease | Admitting: Cardiovascular Disease

## 2012-08-23 DIAGNOSIS — Z01818 Encounter for other preprocedural examination: Secondary | ICD-10-CM | POA: Insufficient documentation

## 2012-08-23 DIAGNOSIS — Z01811 Encounter for preprocedural respiratory examination: Secondary | ICD-10-CM

## 2012-08-28 MED ORDER — SODIUM CHLORIDE 0.9 % IJ SOLN: 3.0000 mL | INTRAMUSCULAR | Status: DC | PRN

## 2012-08-28 MED ORDER — SODIUM CHLORIDE 0.9 % IV SOLN: 250.0000 mL | INTRAVENOUS | Status: DC

## 2012-08-28 MED ORDER — SODIUM CHLORIDE 0.45 % IV SOLN
INTRAVENOUS | Status: DC
  Administered 2012-08-29: 09:00:00 via INTRAVENOUS

## 2012-08-28 MED ORDER — SODIUM CHLORIDE 0.9 % IJ SOLN: 3.0000 mL | Freq: Two times a day (BID) | INTRAMUSCULAR | Status: DC

## 2012-08-28 MED ORDER — SODIUM CHLORIDE 0.9 % IR SOLN
80.0000 mg | Status: DC
  Filled 2012-08-28: qty 2

## 2012-08-28 MED ORDER — DEXTROSE 5 % IV SOLN
3.0000 g | INTRAVENOUS | Status: DC
  Filled 2012-08-28: qty 3000

## 2012-08-29 ENCOUNTER — Ambulatory Visit (HOSPITAL_COMMUNITY)
Admission: RE | Admit: 2012-08-29 | Discharge: 2012-08-30 | Disposition: A | Discharge status: Home / Self Care | Payer: Medicare Other | Source: Ambulatory Visit | Attending: Cardiovascular Disease | Admitting: Cardiovascular Disease

## 2012-08-29 ENCOUNTER — Encounter (HOSPITAL_COMMUNITY): Payer: Self-pay | Admitting: Family Medicine

## 2012-08-29 ENCOUNTER — Encounter (HOSPITAL_COMMUNITY)
Admission: RE | Disposition: A | Discharge status: Home / Self Care | Payer: Self-pay | Source: Ambulatory Visit | Attending: Cardiovascular Disease

## 2012-08-29 DIAGNOSIS — E785 Hyperlipidemia, unspecified: Secondary | ICD-10-CM | POA: Diagnosis present

## 2012-08-29 DIAGNOSIS — Z950 Presence of cardiac pacemaker: Secondary | ICD-10-CM

## 2012-08-29 DIAGNOSIS — I495 Sick sinus syndrome: Secondary | ICD-10-CM | POA: Diagnosis present

## 2012-08-29 DIAGNOSIS — Z79899 Other long term (current) drug therapy: Secondary | ICD-10-CM | POA: Insufficient documentation

## 2012-08-29 DIAGNOSIS — Z7902 Long term (current) use of antithrombotics/antiplatelets: Secondary | ICD-10-CM | POA: Insufficient documentation

## 2012-08-29 DIAGNOSIS — I10 Essential (primary) hypertension: Secondary | ICD-10-CM | POA: Diagnosis present

## 2012-08-29 DIAGNOSIS — Z7982 Long term (current) use of aspirin: Secondary | ICD-10-CM | POA: Insufficient documentation

## 2012-08-29 HISTORY — PX: PERMANENT PACEMAKER INSERTION: SHX5480

## 2012-08-29 HISTORY — DX: Hyperlipidemia, unspecified: E78.5

## 2012-08-29 HISTORY — DX: Sick sinus syndrome: I49.5

## 2012-08-29 HISTORY — DX: Presence of cardiac pacemaker: Z95.0

## 2012-08-29 HISTORY — DX: Essential (primary) hypertension: I10

## 2012-08-29 HISTORY — PX: INSERT / REPLACE / REMOVE PACEMAKER: SUR710

## 2012-08-29 HISTORY — DX: Presence of automatic (implantable) cardiac defibrillator: Z95.810

## 2012-08-29 LAB — SURGICAL PCR SCREEN: MRSA, PCR: NEGATIVE

## 2012-08-29 SURGERY — PERMANENT PACEMAKER INSERTION
Anesthesia: LOCAL

## 2012-08-29 MED ORDER — ATORVASTATIN CALCIUM 10 MG PO TABS: 10.0000 mg | ORAL_TABLET | Freq: Every day | ORAL | Status: DC

## 2012-08-29 MED ORDER — MELOXICAM 15 MG PO TABS
15.0000 mg | ORAL_TABLET | Freq: Every day | ORAL | Status: DC | PRN
  Filled 2012-08-29: qty 1

## 2012-08-29 MED ORDER — RAMIPRIL 2.5 MG PO CAPS
2.5000 mg | ORAL_CAPSULE | Freq: Every day | ORAL | Status: DC
  Administered 2012-08-30: 2.5 mg via ORAL
  Filled 2012-08-29: qty 1

## 2012-08-29 MED ORDER — HYDROCODONE-ACETAMINOPHEN 5-325 MG PO TABS: 1.0000 | ORAL_TABLET | ORAL | Status: DC | PRN

## 2012-08-29 MED ORDER — MIDAZOLAM HCL 2 MG/2ML IJ SOLN
INTRAMUSCULAR | Status: AC
  Filled 2012-08-29: qty 2

## 2012-08-29 MED ORDER — HEPARIN (PORCINE) IN NACL 2-0.9 UNIT/ML-% IJ SOLN
INTRAMUSCULAR | Status: AC
  Filled 2012-08-29: qty 500

## 2012-08-29 MED ORDER — SODIUM CHLORIDE 0.9 % IV SOLN
INTRAVENOUS | Status: AC
  Administered 2012-08-29: 14:00:00 via INTRAVENOUS

## 2012-08-29 MED ORDER — MUPIROCIN 2 % EX OINT
TOPICAL_OINTMENT | Freq: Two times a day (BID) | CUTANEOUS | Status: DC
  Administered 2012-08-29: 1 via NASAL

## 2012-08-29 MED ORDER — CEFAZOLIN SODIUM 1-5 GM-% IV SOLN
1.0000 g | Freq: Four times a day (QID) | INTRAVENOUS | Status: AC
  Administered 2012-08-29 – 2012-08-30 (×3): 1 g via INTRAVENOUS
  Filled 2012-08-29 (×3): qty 50

## 2012-08-29 MED ORDER — FENTANYL CITRATE 0.05 MG/ML IJ SOLN
INTRAMUSCULAR | Status: AC
  Filled 2012-08-29: qty 2

## 2012-08-29 MED ORDER — METOPROLOL TARTRATE 25 MG PO TABS
25.0000 mg | ORAL_TABLET | Freq: Two times a day (BID) | ORAL | Status: DC
  Administered 2012-08-29 – 2012-08-30 (×2): 25 mg via ORAL
  Filled 2012-08-29 (×3): qty 1

## 2012-08-29 MED ORDER — ONDANSETRON HCL 4 MG/2ML IJ SOLN: 4.0000 mg | Freq: Four times a day (QID) | INTRAMUSCULAR | Status: DC | PRN

## 2012-08-29 MED ORDER — EZETIMIBE 10 MG PO TABS
10.0000 mg | ORAL_TABLET | Freq: Every day | ORAL | Status: DC
  Administered 2012-08-30: 10 mg via ORAL
  Filled 2012-08-29: qty 1

## 2012-08-29 MED ORDER — FERROUS SULFATE 325 (65 FE) MG PO TABS
325.0000 mg | ORAL_TABLET | Freq: Every day | ORAL | Status: DC
  Administered 2012-08-30: 325 mg via ORAL
  Filled 2012-08-29 (×2): qty 1

## 2012-08-29 MED ORDER — ACETAMINOPHEN 325 MG PO TABS
325.0000 mg | ORAL_TABLET | ORAL | Status: DC | PRN
  Administered 2012-08-29 – 2012-08-30 (×5): 650 mg via ORAL
  Filled 2012-08-29 (×5): qty 2

## 2012-08-29 MED ORDER — METOPROLOL TARTRATE 12.5 MG HALF TABLET
12.5000 mg | ORAL_TABLET | Freq: Two times a day (BID) | ORAL | Status: DC
  Filled 2012-08-29: qty 1

## 2012-08-29 MED ORDER — L-METHYLFOLATE-B6-B12 3-35-2 MG PO TABS
1.0000 | ORAL_TABLET | Freq: Every day | ORAL | Status: DC
  Administered 2012-08-30: 1 via ORAL
  Filled 2012-08-29: qty 1

## 2012-08-29 MED ORDER — ASPIRIN EC 81 MG PO TBEC
81.0000 mg | DELAYED_RELEASE_TABLET | Freq: Every day | ORAL | Status: DC
  Administered 2012-08-29 – 2012-08-30 (×2): 81 mg via ORAL
  Filled 2012-08-29 (×2): qty 1

## 2012-08-29 MED ORDER — ATORVASTATIN CALCIUM 10 MG PO TABS
10.0000 mg | ORAL_TABLET | ORAL | Status: DC
  Filled 2012-08-29: qty 1

## 2012-08-29 MED ORDER — ATORVASTATIN CALCIUM 20 MG PO TABS
20.0000 mg | ORAL_TABLET | ORAL | Status: DC
  Administered 2012-08-29: 20 mg via ORAL
  Filled 2012-08-29: qty 1

## 2012-08-29 MED ORDER — LIDOCAINE HCL (PF) 1 % IJ SOLN
INTRAMUSCULAR | Status: AC
  Filled 2012-08-29: qty 60

## 2012-08-29 NOTE — CV Procedure (Signed)
Pamela [name] Female, 79 y.o., [birth year]  Location: [facility] MRN: [id number]  CSN: [id number] Admit Dt: 08/29/12   Procedure report  Procedure performed:  1. Implantation of new dual chamber permanent pacemaker 2. Fluoroscopy 3. Light sedation  Reason for procedure: Symptomatic bradycardia due to: Sinus node dysfunction Tachycardia-bradycardia syndrome Bradycardia due to necessary medications  Procedure performed by: Thurmon Fair, MD  Complications: None  Estimated blood loss: <10 mL  Medications administered during procedure: Ancef 3g intravenously Lidocaine 1% 30 mL locally,  Fentanyl 50 mcg intravenously Versed 2 mg intravenously  Device details:  Facilities manager. Jude Accent DR RF model [id number] serial number [id number] Right atrial lead St. Jude tendril STS-46 serial number [id number] Right ventricular lead St. Jude Tendril STS-52 serial number [id number]  Procedure details:  After the risks and benefits of the procedure were discussed the patient provided informed consent and was brought to the cardiac cath lab in the fasting state. The patient was prepped and draped in usual sterile fashion. Local anesthesia with 1% lidocaine was administered to to the left infraclavicular area. A 5-6 cm horizontal incision was made parallel with and 2-3 cm caudal to the left clavicle. Using electrocautery and blunt dissection a prepectoral pocket was created down to the level of the pectoralis major muscle fascia. The pocket was carefully inspected for hemostasis. An antibiotic-soaked sponge was placed in the pocket.  Under fluoroscopic guidance and using the modified Seldinger technique 2 separate venipunctures were performed to access the left subclavian vein. No difficulty was encountered accessing the vein.  Two J-tip guidewires were subsequently exchanged for two 7 French safe sheaths.  Under fluoroscopic guidance the ventricular lead was advanced to level of the mid to apical  right ventricular septum and thet active-fixation helix was deployed. Prominent current of injury was seen. Satisfactory pacing and sensing parameters were recorded. There was no evidence of diaphragmatic stimulation at maximum device output. The safe sheath was peeled away and the lead was secured in place with 2-0 silk.  In similar fashion the right atrial lead was advanced to the level of the atrial appendage. The active-fixation helix was deployed. There was prominent current of injury. Satisfactory  pacing and sensing parameters were recorded. There was no evidence of diaphragmatic stimulation with pacing at maximum device output. The safe sheath was peeled away and the lead was secured in place with 2-0 silk.  The antibiotic-soaked sponge was removed from the pocket. The pocket was flushed with copious amounts of antibiotic solution. Reinspection showed excellent hemostasis..  The ventricular lead was connected to the generator and appropriate ventricular pacing was seen. Subsequently the atrial lead was also connected. Repeat testing of the lead parameters later showed excellent values.  The entire system was then carefully inserted in the pocket with care been taking that the leads and device assumed a comfortable position without pressure on the incision. Great care was taken that the leads be located deep to the generator. The pocket was then closed in layers using 2 layers of 2-0 Vicryl and cutaneous staples, after which a sterile dressing was applied.  At the end of the procedure the following lead parameters were encountered:  Right atrial lead  sensed P waves 2.5 mV, impedance 482 ohms, threshold 0.7 V at 0.4 ms pulse width.  Right ventricular lead sensed R waves 9.3 mV, impedance 723 ohms, threshold 0.6 V at 0.4 ms pulse width.  Thurmon Fair, MD, [facility] [phone] office [phone] pager 08/29/2012

## 2012-08-29 NOTE — H&P (Signed)
Date of Initial H&P: 08/05/12  History reviewed, patient examined, no change in status, stable for surgery. Dual chamber permanent pacemaker for symptomatic sinus bradycardia and bradycardia due to necessary meds ( for parox atrial tachyarrhythmia). This procedure has been fully reviewed with the patient and written informed consent has been obtained. Thurmon Fair, MD, [facility] [phone] office [phone] pager 08/29/2012

## 2012-08-29 NOTE — Progress Notes (Signed)
Called [place], [place] about new drainage on pacer dressing, he came to check it and stated to continue monitoring site.

## 2012-08-30 ENCOUNTER — Ambulatory Visit (HOSPITAL_COMMUNITY): Payer: Medicare Other

## 2012-08-30 ENCOUNTER — Encounter (HOSPITAL_COMMUNITY): Payer: Self-pay | Admitting: Cardiology

## 2012-08-30 DIAGNOSIS — Z950 Presence of cardiac pacemaker: Secondary | ICD-10-CM

## 2012-08-30 DIAGNOSIS — I10 Essential (primary) hypertension: Secondary | ICD-10-CM | POA: Diagnosis present

## 2012-08-30 DIAGNOSIS — E785 Hyperlipidemia, unspecified: Secondary | ICD-10-CM

## 2012-08-30 DIAGNOSIS — I495 Sick sinus syndrome: Secondary | ICD-10-CM

## 2012-08-30 HISTORY — DX: Sick sinus syndrome: I49.5

## 2012-08-30 HISTORY — DX: Essential (primary) hypertension: I10

## 2012-08-30 HISTORY — DX: Presence of cardiac pacemaker: Z95.0

## 2012-08-30 HISTORY — DX: Hyperlipidemia, unspecified: E78.5

## 2012-08-30 MED ORDER — HYDROCODONE-ACETAMINOPHEN 5-325 MG PO TABS: 1.0000 | ORAL_TABLET | ORAL | Status: DC | PRN

## 2012-08-30 MED ORDER — METOPROLOL TARTRATE 25 MG PO TABS: 25.0000 mg | ORAL_TABLET | Freq: Two times a day (BID) | ORAL | Status: DC

## 2012-08-30 NOTE — Discharge Summary (Signed)
Physician Discharge Summary  Patient ID: Pamela [name] MRN: [id number] DOB/AGE: 01/21/1921 79 y.o.  Admit date: 08/29/2012 Discharge date: 08/30/2012  Discharge Diagnoses:  Principal Problem:  *SSS (sick sinus syndrome) with PAT and marked bradycardia with 3 sec pauses  Active Problems:  Status post placement of cardiac pacemaker. 08/29/12 St. Jude device  HTN (hypertension)  Hyperlipidemia   Discharged Condition: good  Procedures: 08/29/12  Generator St. Jude Accent DR RF model [id number] serial number [id number]  Right atrial lead St. Jude tendril STS-46 serial number [id number]  Right ventricular lead St. Jude Tendril STS-52 serial number [id number] by Dr. [facility] Course:89 YOWF presented electively for dual chamber PPM secondary to SSS with PAT and then episodes of 3 second pauses.  Patient underwent elective pacemaker implantation without complications.  The next am she was stable and ready for discharge home.  Minimal amount of bleeding on dressing.  No hematoma.  No pneumothorax on CXR day of discharge.     Pacer interrogation 08/30/12: ATRIAL                                                        VENTRICULAR  Capture 0.5V @ 0.4 ms                                   0.5 V @ 0.4 ms  Sense 3.8V (2.3mV)                                         5.8V (5.62mV)  Lead Imped. 510 ohms (460 ohms)               540 ohms ( 640 ohms)   Pt ambulated without problems.  Her lopressor was increased to 25 mg BID.  She was stable and discharged home after exam by Dr. Royann Shivers.  She will follow up in [place].   Consults: None  Significant Diagnostic Studies:  2 View CXR 08/30/12: Comparison: PA and lateral chest 08/23/2012.  Findings: The patient has a new dual lead pacing device with the  tip of the proximal lead in the right atrium distal lead in the  right ventricle. There is no pneumothorax. Cardiomegaly without  pulmonary edema is identified. No pleural fluid. Punctate  calcified  granuloma right upper lobe noted. The patient is status  post vertebral augmentation at two levels in the mid thoracic  spine. Marked convex left lumbar scoliosis is partially  visualized.  IMPRESSION:  1. Pacing device in place without complication.  2. Cardiomegaly without edema.   Discharge Exam: Blood pressure 122/80, pulse 80, temperature 98.3 F (36.8 C), temperature source Oral, resp. rate 16, height 5' 5.5" (1.664 m), weight 69.854 kg (154 lb), SpO2 100.00%.   AM of discharge:  Disposition: PE: General:alert and oriented, walked to BR without problems, site with two small area of old blood, site with minimal edema  Heart:S1S2 RRR  Lungs:clear  Abd:+ BS, soft, non tender  Ext:no edema      Medication List     As of 08/30/2012 10:55 AM    TAKE these medications         aspirin  EC 81 MG tablet   Take 81 mg by mouth daily.      atorvastatin 20 MG tablet   Commonly known as: LIPITOR   Take 10-20 mg by mouth daily. Alternate doses of 10mg  and 20mg  each day      ezetimibe 10 MG tablet   Commonly known as: ZETIA   Take 10 mg by mouth daily.      ferrous sulfate 325 (65 FE) MG tablet   Take 325 mg by mouth daily with breakfast.      furosemide 20 MG tablet   Commonly known as: LASIX   Take 20 mg by mouth daily as needed. For fluid retention      HYDROcodone-acetaminophen 5-325 MG per tablet   Commonly known as: NORCO/VICODIN   Take 1-2 tablets by mouth every 4 (four) hours as needed for pain.      l-methylfolate-B6-B12 3-35-2 MG Tabs   Commonly known as: METANX   Take 1 tablet by mouth daily.      meloxicam 15 MG tablet   Commonly known as: MOBIC   Take 15 mg by mouth daily as needed. For inflamation      metoprolol tartrate 25 MG tablet   Commonly known as: LOPRESSOR   Take 1 tablet (25 mg total) by mouth 2 (two) times daily.      OCUVITE PO   Take 1 tablet by mouth 2 (two) times daily.      ramipril 2.5 MG tablet   Commonly known as: ALTACE   Take 2.5  mg by mouth daily.      SYSTANE OP   Apply 1 drop to eye 2 (two) times daily.      VITAMIN D-3 PO   Take 1 capsule by mouth daily.           Follow-up Information    Follow up with Thurmon Fair, MD. On 09/11/2012. (at the [place] office at 9:15 am)    Contact information:   [address] [place] [address] [phone]        DISCHARGE INSTRUCTIONS: Heart Healthy diet     Supplemental Discharge Instructions for  Pacemaker Patients   Signed: Leone Brand 08/30/2012, 10:55 AM

## 2012-08-30 NOTE — Progress Notes (Signed)
Implantation of new dual chamber permanent pacemaker for:  Symptomatic bradycardia due to:  Sinus node dysfunction  Tachycardia-bradycardia syndrome  Bradycardia due to necessary medications   Subjective: No complaints, some drainage last pm  Objective: Vital signs in last 24 hours: Temp:  [98 F (36.7 C)-98.6 F (37 C)] 98.3 F (36.8 C) (01/31 0600) Pulse Rate:  [62-80] 62  (01/31 0600) Resp:  [15-18] 16  (01/31 0600) BP: (107-169)/(55-98) 169/59 mmHg (01/31 0600) SpO2:  [98 %-100 %] 100 % (01/31 0600) Weight change:  Last BM Date: 08/29/12 Intake/Output from previous day: 01/30 0701 - 01/31 0700 In: 625 [P.O.:355; I.V.:270] Out: -  Intake/Output this shift:    PE: General:alert and oriented, walked to BR without problems, site with two small area of old blood, site with minimal edema Heart:S1S2 RRR Lungs:clear Abd:+ BS, soft, non tender Ext:no edema                                            ATRIAL                                                  VENTRICULAR  Capture                          0.5V @ 0.4 ms                                          0.5 V @ 0.4 ms  Sense                               3.8V  (2.74mV)                                           5.8V   (5.97mV)    Lead Imped.                    510 ohms  (460 ohms)                             540 ohms  ( 640 ohms)    LStudies/Results: Dg Chest 2 View  08/30/2012  *RADIOLOGY REPORT*  Clinical Data: Status post pacemaker placement.  CHEST - 2 VIEW  Comparison: PA and lateral chest 08/23/2012.  Findings: The patient has a new dual lead pacing device with the tip of the proximal lead in the right atrium distal lead in the right ventricle.  There is no pneumothorax.  Cardiomegaly without pulmonary edema is identified.  No pleural fluid.  Punctate calcified granuloma right upper lobe noted. The patient is status post vertebral augmentation at two levels in the mid thoracic spine.  Marked convex left lumbar scoliosis  is partially visualized.  IMPRESSION:  1.  Pacing device in place without complication. 2.  Cardiomegaly without edema.   Original Report Authenticated By: Holley Dexter, M.D.  08/29/12  Procedure Generator St. Jude Accent DR RF model [id number] serial number [id number]  Right atrial lead St. Jude tendril STS-46 serial number [id number]  Right ventricular lead St. Jude Tendril STS-52 serial number [id number]  Medications: I have reviewed the patient's current medications.    Marland Kitchen aspirin EC  81 mg Oral Daily  . atorvastatin  10 mg Oral Q48H  . atorvastatin  20 mg Oral Q48H  . ezetimibe  10 mg Oral Daily  . ferrous sulfate  325 mg Oral Q breakfast  . l-methylfolate-B6-B12  1 tablet Oral Daily  . metoprolol tartrate  25 mg Oral BID  . ramipril  2.5 mg Oral Daily   Assessment/Plan: Principal Problem:  *SSS (sick sinus syndrome) with PAT and marked bradycardia with 3 sec pauses  Active Problems:  Status post placement of cardiac pacemaker. 08/29/12 St. Jude device  HTN (hypertension)  Hyperlipidemia  PLAN: some oozing last pm-minimal.  No pneumothorax. Ambulate and d/c home if she has no problems with ambulation.   LOS: 1 day   INGOLD,LAURA R 08/30/2012, 9:51 AM   I have seen and examined the patient along with [facility] R NP.  I have reviewed the chart, notes and new data.  I agree with NP's note.  Key new complaints: none Key examination changes: site looks healthy Key new findings / data: good lead parameters  PLAN: DC home. Wound care and activity restrictions reviewed in detail.  Thurmon Fair, MD, [facility] [phone] 08/30/2012, 3:36 PM

## 2012-10-16 ENCOUNTER — Telehealth: Payer: Self-pay | Admitting: Neurology

## 2012-10-23 LAB — PACEMAKER DEVICE OBSERVATION

## 2012-11-07 ENCOUNTER — Encounter: Payer: Self-pay | Admitting: Neurology

## 2012-11-07 ENCOUNTER — Ambulatory Visit (INDEPENDENT_AMBULATORY_CARE_PROVIDER_SITE_OTHER): Payer: Medicare Other | Admitting: Neurology

## 2012-11-07 VITALS — BP 152/74 | HR 71 | Ht 63.0 in | Wt 155.0 lb

## 2012-11-07 DIAGNOSIS — R413 Other amnesia: Secondary | ICD-10-CM

## 2012-11-07 DIAGNOSIS — G609 Hereditary and idiopathic neuropathy, unspecified: Secondary | ICD-10-CM

## 2012-11-07 DIAGNOSIS — I259 Chronic ischemic heart disease, unspecified: Secondary | ICD-10-CM

## 2012-11-07 DIAGNOSIS — I10 Essential (primary) hypertension: Secondary | ICD-10-CM

## 2012-11-07 DIAGNOSIS — G250 Essential tremor: Secondary | ICD-10-CM

## 2012-11-07 DIAGNOSIS — R209 Unspecified disturbances of skin sensation: Secondary | ICD-10-CM

## 2012-11-07 DIAGNOSIS — I499 Cardiac arrhythmia, unspecified: Secondary | ICD-10-CM

## 2012-11-07 DIAGNOSIS — M810 Age-related osteoporosis without current pathological fracture: Secondary | ICD-10-CM

## 2012-11-07 NOTE — Progress Notes (Signed)
Reason for visit: Peripheral neuropathy  Pamela [name] is an 79 y.o. female  History of present illness:  Pamela [name] is an 79 year old right-handed white female with a history of a mild peripheral neuropathy. The patient has some numbness in the feet that seems to worsen when she is tired. The patient has been on Metanx for several years. The patient indicates that she is sleeping well, and she denies any significant balance issues. The patient has been having some occasional cramps involving the right hand on average once a week. The patient is on cholesterol lowering agents. The patient does not require a cane for ambulation. The patient returns for an evaluation. The patient recently had a pacemaker placed.   Past Medical History  Diagnosis Date  . Pacemaker 08/29/2012    st jude  . ICD (implantable cardiac defibrillator) in place   . SSS (sick sinus syndrome) with PAT and marked bradycardia with 3 sec pauses  08/30/2012  . Status post placement of cardiac pacemaker. 08/29/12 St. Jude device 08/30/2012  . HTN (hypertension) 08/30/2012  . Hyperlipidemia 08/30/2012  . Polyneuropathy in other diseases classified elsewhere   . Macular degeneration     Past Surgical History  Procedure Laterality Date  . Cardiac catheterization  2006    3 stents placed  . Insert / replace / remove pacemaker  08/29/2012    st jude   . Rotator cuff repair Left   . Abdominal hysterectomy    . Cesarean section    . Kidney stone surgery    . Rotator cuff repair Right     Family History  Problem Relation Age of Onset  . Heart disease Mother   . Congestive Heart Failure Mother   . Heart disease Father   . Heart attack Father   . Heart disease Brother     Social history:  reports that she has never smoked. She does not have any smokeless tobacco history on file. She reports that she does not drink alcohol or use illicit drugs.  Allergies: No Known Allergies  Medications:  Current Outpatient  Prescriptions on File Prior to Visit  Medication Sig Dispense Refill  . aspirin EC 81 MG tablet Take 81 mg by mouth daily.      Marland Kitchen atorvastatin (LIPITOR) 20 MG tablet Take 10-20 mg by mouth daily. Alternate doses of 10mg  and 20mg  each day      . Cholecalciferol (VITAMIN D-3 PO) Take 1 capsule by mouth daily.      Marland Kitchen ezetimibe (ZETIA) 10 MG tablet Take 10 mg by mouth daily.      . ferrous sulfate 325 (65 FE) MG tablet Take 325 mg by mouth daily with breakfast.      . furosemide (LASIX) 20 MG tablet Take 20 mg by mouth daily as needed. For fluid retention      . HYDROcodone-acetaminophen (NORCO/VICODIN) 5-325 MG per tablet Take 1-2 tablets by mouth every 4 (four) hours as needed for pain.  15 tablet  0  . l-methylfolate-B6-B12 (METANX) 3-35-2 MG TABS Take 1 tablet by mouth daily.      . meloxicam (MOBIC) 15 MG tablet Take 15 mg by mouth daily as needed. For inflamation      . Multiple Vitamins-Minerals (OCUVITE PO) Take 1 tablet by mouth 2 (two) times daily.      Bertram Gala Glycol-Propyl Glycol (SYSTANE OP) Apply 1 drop to eye 2 (two) times daily.      . ramipril (ALTACE) 2.5 MG tablet Take  2.5 mg by mouth daily.      . metoprolol tartrate (LOPRESSOR) 25 MG tablet Take 1 tablet (25 mg total) by mouth 2 (two) times daily.  60 tablet  6   No current facility-administered medications on file prior to visit.    ROS:  Out of a complete 14 system review of symptoms, the patient complains only of the following symptoms, and all other reviewed systems are negative.  Fatigue  Blurred vision Swelling in the legs Joint swelling Numbness  Blood pressure 152/74, pulse 71, height 5\' 3"  (1.6 m), weight 155 lb (70.308 kg).  Physical Exam  General: The patient is alert and cooperative at the time of the examination.  Skin: No significant peripheral edema is noted.   Neurologic Exam  Cranial nerves: Facial symmetry is present. Speech is normal, no aphasia or dysarthria is noted. Extraocular  movements are full. Visual fields are full.  Motor: The patient has good strength in all 4 extremities.  Coordination: The patient has good finger-nose-finger and heel-to-shin bilaterally.  Gait and station: The patient has a normal gait. Tandem gait is normal. Romberg is negative. No drift is seen.  Reflexes: Deep tendon reflexes are symmetric, but are depressed .   Assessment/Plan:   1. Peripheral neuropathy  The patient has minimal problems with her neuropathy. The patient has some slight intermittent numbness in the feet. The patient has excellent balance. The patient will continue the Metanx for now, and she will followup in one year.   Marlan Palau MD 11/07/2012 7:46 PM  [facility] [address] [place], [place] [address]  Phone [phone] Fax [phone]

## 2012-12-04 ENCOUNTER — Encounter: Payer: Self-pay | Admitting: Cardiovascular Disease

## 2012-12-10 NOTE — Telephone Encounter (Signed)
done

## 2012-12-25 ENCOUNTER — Other Ambulatory Visit: Payer: Self-pay | Admitting: Cardiovascular Disease

## 2012-12-25 ENCOUNTER — Ambulatory Visit (INDEPENDENT_AMBULATORY_CARE_PROVIDER_SITE_OTHER): Payer: Medicare Other | Admitting: Cardiovascular Disease

## 2012-12-25 ENCOUNTER — Encounter: Payer: Self-pay | Admitting: Cardiovascular Disease

## 2012-12-25 VITALS — BP 162/60 | HR 68 | Ht 66.0 in | Wt 156.0 lb

## 2012-12-25 DIAGNOSIS — I471 Supraventricular tachycardia: Secondary | ICD-10-CM

## 2012-12-25 DIAGNOSIS — I10 Essential (primary) hypertension: Secondary | ICD-10-CM

## 2012-12-25 DIAGNOSIS — I495 Sick sinus syndrome: Secondary | ICD-10-CM

## 2012-12-25 DIAGNOSIS — E785 Hyperlipidemia, unspecified: Secondary | ICD-10-CM

## 2012-12-25 DIAGNOSIS — Z950 Presence of cardiac pacemaker: Secondary | ICD-10-CM

## 2012-12-25 LAB — PACEMAKER DEVICE OBSERVATION
AL THRESHOLD: 0.625 V
BAMS-0001: 150 {beats}/min
BAMS-0003: 70 {beats}/min
RV LEAD AMPLITUDE: 6 mv

## 2012-12-25 MED ORDER — NEBIVOLOL HCL 5 MG PO TABS: 5.0000 mg | ORAL_TABLET | Freq: Every day | ORAL | Status: DC

## 2012-12-25 NOTE — Progress Notes (Signed)
In clinic device check. Normal pacemaker function. Minimal programming changes made. Patient to follow up remotely in 1 month due to medication change.

## 2012-12-25 NOTE — Progress Notes (Signed)
Patient ID: Pamela [name], female   DOB: [birth year], 79 y.o.   MRN: [id number]  Reason for office visit SSS, atrial tachycardia,HTN, pacemaker evaluation  HPI Fatigue is a little better with bystolic rather than metoprolol. She remains very active for a nonagenarian, living independently, managing her own household. Would like more energy, but thinks she has volunteered for too many community activities!  No Known Allergies  Current Outpatient Prescriptions  Medication Sig Dispense Refill  . acetaminophen (TYLENOL) 325 MG tablet Take 650 mg by mouth every 6 (six) hours as needed for pain.      Marland Kitchen aspirin EC 81 MG tablet Take 81 mg by mouth daily.      Marland Kitchen atorvastatin (LIPITOR) 20 MG tablet Take 10-20 mg by mouth daily. Alternate doses of 10mg  and 20mg  each day      . Cholecalciferol (VITAMIN D-3 PO) Take 1 capsule by mouth daily.      Marland Kitchen ezetimibe (ZETIA) 10 MG tablet Take 10 mg by mouth daily.      . ferrous sulfate 325 (65 FE) MG tablet Take 325 mg by mouth every other day.       . furosemide (LASIX) 20 MG tablet Take 20 mg by mouth daily as needed. For fluid retention      . l-methylfolate-B6-B12 (METANX) 3-35-2 MG TABS Take 1 tablet by mouth daily.      . meloxicam (MOBIC) 15 MG tablet Take 15 mg by mouth daily as needed. For inflamation      . Multiple Vitamins-Minerals (OCUVITE PO) Take 1 tablet by mouth 2 (two) times daily.      . nebivolol (BYSTOLIC) 5 MG tablet Take 1 tablet (5 mg total) by mouth daily.  30 tablet  6  . Polyethyl Glycol-Propyl Glycol (SYSTANE OP) Apply 1 drop to eye 2 (two) times daily.      . ramipril (ALTACE) 2.5 MG tablet Take 2.5 mg by mouth daily.      Marland Kitchen HYDROcodone-acetaminophen (NORCO/VICODIN) 5-325 MG per tablet Take 1-2 tablets by mouth every 4 (four) hours as needed for pain.  15 tablet  0   No current facility-administered medications for this visit.    Past Medical History  Diagnosis Date  . Pacemaker 08/29/2012    st jude accent DR RF device,  model number [id number], serial number [id number], implanted 08/29/2012 for sinus bradycardia, runs of supraventricular tachycardia  last checked 10/23/2012  . ICD (implantable cardiac defibrillator) in place   . SSS (sick sinus syndrome) with PAT and marked bradycardia with 3 sec pauses  08/30/2012  . Status post placement of cardiac pacemaker. 08/29/12 St. Jude device 08/30/2012  . HTN (hypertension) 08/30/2012  . Hyperlipidemia 08/30/2012  . Polyneuropathy in other diseases classified elsewhere   . Macular degeneration   . Coronary artery disease     stent in the LAD artery in 2002  . Venous insufficiency     Past Surgical History  Procedure Laterality Date  . Cardiac catheterization  2006    3 stents placed  . Insert / replace / remove pacemaker  08/29/2012    st jude   . Rotator cuff repair Left   . Abdominal hysterectomy    . Cesarean section    . Kidney stone surgery    . Rotator cuff repair Right   . Cardiac catheterization  06/2001    placement of BiodivYsio 2.5.10mm stent dilated to 2.12mm in proximal left anterior descenting stenotic lesion  . Radiofrequency ablation  08/25/2010  ablation of left greater saphenous vein    Family History  Problem Relation Age of Onset  . Heart disease Mother   . Congestive Heart Failure Mother   . Heart disease Father   . Heart attack Father   . Heart disease Brother     History   Social History  . Marital Status: Widowed    Spouse Name: N/A    Number of Children: 2  . Years of Education: N/A   Occupational History  .     Social History Main Topics  . Smoking status: Never Smoker   . Smokeless tobacco: Not on file  . Alcohol Use: No  . Drug Use: No  . Sexually Active: Not on file   Other Topics Concern  . Not on file   Social History Narrative  . No narrative on file    Review of systems: Only real complaint is she would like more energy. The patient specifically denies any chest pain at rest exertion, dyspnea at rest  or with exertion, orthopnea, paroxysmal nocturnal dyspnea, syncope, palpitations, focal neurological deficits, intermittent claudication, lower extremity edema, unexplained weight gain, cough, hemoptysis or wheezing.   PHYSICAL EXAM BP 162/60  Pulse 68  Ht 5\' 6"  (1.676 m)  Wt 156 lb (70.761 kg)  BMI 25.19 kg/m2  General: Alert, oriented x3, no distress Head: no evidence of trauma, PERRL, EOMI, no exophtalmos or lid lag, no myxedema, no xanthelasma; normal ears, nose and oropharynx Neck: normal jugular venous pulsations and no hepatojugular reflux; brisk carotid pulses without delay and no carotid bruits Chest: clear to auscultation, no signs of consolidation by percussion or palpation, normal fremitus, symmetrical and full respiratory excursions; left subclavian pacemaker site is healthy Cardiovascular: normal position and quality of the apical impulse, regular rhythm, normal first and second heart sounds, no murmurs, rubs or gallops Abdomen: no tenderness or distention, no masses by palpation, no abnormal pulsatility or arterial bruits, normal bowel sounds, no hepatosplenomegaly Extremities: no clubbing, cyanosis or edema; 2+ radial, ulnar and brachial pulses bilaterally; 2+ right femoral, posterior tibial and dorsalis pedis pulses; 2+ left femoral, posterior tibial and dorsalis pedis pulses; no subclavian or femoral bruits Neurological: grossly nonfocal   EKG: ApVs    Lipid Panel  No results found for this basename: chol, trig, hdl, cholhdl, vldl, ldlcalc    BMET No results found for this basename: na, k, cl, co2, glucose, bun, creatinine, calcium, gfrnonaa, gfraa     ASSESSMENT AND PLAN  SSS (sick sinus syndrome) with PAT and marked bradycardia with 3 sec pauses  Normal pacemaker function. Her heart rate histograms show a favorable, if not a slightly aggressive distribution and I do not think any additional sensor enhancements would be of benefit. I don't think her residual  tiredness is attributable to bradycardia.  PAT (paroxysmal atrial tachycardia) No atrial fibrillation has been detected by her device. The episodes of SVT (maximum 36 seconds )are to brief to be associated with fatigue or dyspnea and she has not really noticed palpitations. No change in therapy.  Status post placement of cardiac pacemaker. 08/29/12 St. Jude device Normal device function. See attached device interrogation note. Essentially 100% A paced and zero V pacing. Plan follow up via Merlin every 3 months. PVARP extended slightly due to PMT, but no other change to device parameters.     Junious Silk, MD, [facility] [phone] office [phone] pager

## 2012-12-25 NOTE — Patient Instructions (Addendum)
Stop metoprolol, take bystolic 5 mg daily. We will receive your pacemaker downloads every 3 months, beginning this July 1st. Please expect a notification 1-2 weeks later.

## 2012-12-28 NOTE — Assessment & Plan Note (Addendum)
No atrial fibrillation has been detected by her device. The episodes of SVT (maximum 36 seconds )are to brief to be associated with fatigue or dyspnea and she has not really noticed palpitations. No change in therapy.

## 2012-12-28 NOTE — Assessment & Plan Note (Signed)
Normal pacemaker function. Her heart rate histograms show a favorable, if not a slightly aggressive distribution and I do not think any additional sensor enhancements would be of benefit. I don't think her residual tiredness is attributable to bradycardia.

## 2012-12-28 NOTE — Assessment & Plan Note (Addendum)
Normal device function. See attached device interrogation note. Essentially 100% A paced and zero V pacing. Plan follow up via Merlin every 3 months. PVARP extended slightly due to PMT, but no other change to device parameters.

## 2013-01-24 ENCOUNTER — Other Ambulatory Visit (HOSPITAL_COMMUNITY): Payer: Self-pay | Admitting: Family Medicine

## 2013-01-24 DIAGNOSIS — Z139 Encounter for screening, unspecified: Secondary | ICD-10-CM

## 2013-02-03 ENCOUNTER — Ambulatory Visit (HOSPITAL_COMMUNITY)
Admission: RE | Admit: 2013-02-03 | Discharge: 2013-02-03 | Disposition: A | Discharge status: Home / Self Care | Payer: Medicare Other | Source: Ambulatory Visit | Attending: Family Medicine | Admitting: Family Medicine

## 2013-02-03 DIAGNOSIS — Z139 Encounter for screening, unspecified: Secondary | ICD-10-CM

## 2013-02-03 DIAGNOSIS — Z1231 Encounter for screening mammogram for malignant neoplasm of breast: Secondary | ICD-10-CM | POA: Insufficient documentation

## 2013-02-04 ENCOUNTER — Other Ambulatory Visit: Payer: Self-pay | Admitting: Cardiovascular Disease

## 2013-02-04 DIAGNOSIS — I498 Other specified cardiac arrhythmias: Secondary | ICD-10-CM

## 2013-02-04 DIAGNOSIS — I471 Supraventricular tachycardia: Secondary | ICD-10-CM

## 2013-02-04 LAB — PACEMAKER DEVICE OBSERVATION

## 2013-03-17 ENCOUNTER — Encounter: Payer: Self-pay | Admitting: *Deleted

## 2013-03-17 LAB — REMOTE PACEMAKER DEVICE
AL AMPLITUDE: 3 mv
BAMS-0001: 150 {beats}/min
BATTERY VOLTAGE: 3.02 V
RV LEAD IMPEDENCE PM: 480 Ohm
VENTRICULAR PACING PM: 1

## 2013-04-08 ENCOUNTER — Emergency Department (HOSPITAL_COMMUNITY): Payer: Medicare Other

## 2013-04-08 ENCOUNTER — Encounter (HOSPITAL_COMMUNITY): Payer: Self-pay

## 2013-04-08 ENCOUNTER — Observation Stay (HOSPITAL_COMMUNITY)
Admission: EM | Admit: 2013-04-08 | Discharge: 2013-04-09 | Disposition: A | Discharge status: Home / Self Care | Payer: Medicare Other | Attending: Internal Medicine | Admitting: Internal Medicine

## 2013-04-08 DIAGNOSIS — I4719 Other supraventricular tachycardia: Secondary | ICD-10-CM

## 2013-04-08 DIAGNOSIS — H3530 Unspecified macular degeneration: Secondary | ICD-10-CM | POA: Insufficient documentation

## 2013-04-08 DIAGNOSIS — I259 Chronic ischemic heart disease, unspecified: Secondary | ICD-10-CM | POA: Diagnosis present

## 2013-04-08 DIAGNOSIS — Z9861 Coronary angioplasty status: Secondary | ICD-10-CM | POA: Insufficient documentation

## 2013-04-08 DIAGNOSIS — Z9889 Other specified postprocedural states: Secondary | ICD-10-CM

## 2013-04-08 DIAGNOSIS — Z95810 Presence of automatic (implantable) cardiac defibrillator: Secondary | ICD-10-CM | POA: Insufficient documentation

## 2013-04-08 DIAGNOSIS — I2510 Atherosclerotic heart disease of native coronary artery without angina pectoris: Secondary | ICD-10-CM | POA: Insufficient documentation

## 2013-04-08 DIAGNOSIS — I161 Hypertensive emergency: Secondary | ICD-10-CM | POA: Diagnosis present

## 2013-04-08 DIAGNOSIS — I471 Supraventricular tachycardia: Secondary | ICD-10-CM

## 2013-04-08 DIAGNOSIS — Z950 Presence of cardiac pacemaker: Secondary | ICD-10-CM | POA: Diagnosis present

## 2013-04-08 DIAGNOSIS — R209 Unspecified disturbances of skin sensation: Secondary | ICD-10-CM

## 2013-04-08 DIAGNOSIS — R42 Dizziness and giddiness: Secondary | ICD-10-CM | POA: Diagnosis present

## 2013-04-08 DIAGNOSIS — I499 Cardiac arrhythmia, unspecified: Secondary | ICD-10-CM | POA: Diagnosis present

## 2013-04-08 DIAGNOSIS — I872 Venous insufficiency (chronic) (peripheral): Secondary | ICD-10-CM | POA: Insufficient documentation

## 2013-04-08 DIAGNOSIS — R413 Other amnesia: Secondary | ICD-10-CM

## 2013-04-08 DIAGNOSIS — I10 Essential (primary) hypertension: Principal | ICD-10-CM | POA: Diagnosis present

## 2013-04-08 DIAGNOSIS — R5381 Other malaise: Secondary | ICD-10-CM | POA: Insufficient documentation

## 2013-04-08 DIAGNOSIS — M6281 Muscle weakness (generalized): Secondary | ICD-10-CM

## 2013-04-08 DIAGNOSIS — G609 Hereditary and idiopathic neuropathy, unspecified: Secondary | ICD-10-CM

## 2013-04-08 DIAGNOSIS — E785 Hyperlipidemia, unspecified: Secondary | ICD-10-CM | POA: Diagnosis present

## 2013-04-08 DIAGNOSIS — I495 Sick sinus syndrome: Secondary | ICD-10-CM

## 2013-04-08 DIAGNOSIS — M810 Age-related osteoporosis without current pathological fracture: Secondary | ICD-10-CM

## 2013-04-08 DIAGNOSIS — G250 Essential tremor: Secondary | ICD-10-CM

## 2013-04-08 DIAGNOSIS — Z66 Do not resuscitate: Secondary | ICD-10-CM | POA: Insufficient documentation

## 2013-04-08 LAB — BASIC METABOLIC PANEL
CO2: 28 mEq/L (ref 19–32)
Calcium: 10.6 mg/dL — ABNORMAL HIGH (ref 8.4–10.5)
Chloride: 106 mEq/L (ref 96–112)
Glucose, Bld: 100 mg/dL — ABNORMAL HIGH (ref 70–99)
Sodium: 144 mEq/L (ref 135–145)

## 2013-04-08 LAB — PRO B NATRIURETIC PEPTIDE: Pro B Natriuretic peptide (BNP): 533.7 pg/mL — ABNORMAL HIGH (ref 0–450)

## 2013-04-08 LAB — CBC
Hemoglobin: 13.9 g/dL (ref 12.0–15.0)
MCH: 30.2 pg (ref 26.0–34.0)
MCV: 94.1 fL (ref 78.0–100.0)
RBC: 4.61 MIL/uL (ref 3.87–5.11)
WBC: 5 10*3/uL (ref 4.0–10.5)

## 2013-04-08 LAB — URINALYSIS, ROUTINE W REFLEX MICROSCOPIC
Bilirubin Urine: NEGATIVE
Leukocytes, UA: NEGATIVE
Nitrite: NEGATIVE
Specific Gravity, Urine: 1.01 (ref 1.005–1.030)
Urobilinogen, UA: 0.2 mg/dL (ref 0.0–1.0)
pH: 6 (ref 5.0–8.0)

## 2013-04-08 MED ORDER — ATORVASTATIN CALCIUM 20 MG PO TABS
20.0000 mg | ORAL_TABLET | Freq: Every day | ORAL | Status: DC
  Administered 2013-04-08: 20 mg via ORAL
  Filled 2013-04-08: qty 1

## 2013-04-08 MED ORDER — DIPHENHYDRAMINE HCL 25 MG PO CAPS
50.0000 mg | ORAL_CAPSULE | Freq: Once | ORAL | Status: AC
  Administered 2013-04-08: 50 mg via ORAL
  Filled 2013-04-08: qty 2

## 2013-04-08 MED ORDER — ENOXAPARIN SODIUM 40 MG/0.4ML ~~LOC~~ SOLN
40.0000 mg | SUBCUTANEOUS | Status: DC
  Administered 2013-04-08: 40 mg via SUBCUTANEOUS
  Filled 2013-04-08: qty 0.4

## 2013-04-08 MED ORDER — LABETALOL HCL 5 MG/ML IV SOLN
10.0000 mg | Freq: Once | INTRAVENOUS | Status: AC
  Administered 2013-04-08: 10 mg via INTRAVENOUS
  Filled 2013-04-08: qty 4

## 2013-04-08 MED ORDER — POLYVINYL ALCOHOL 1.4 % OP SOLN
1.0000 [drp] | Freq: Two times a day (BID) | OPHTHALMIC | Status: DC
  Administered 2013-04-08 – 2013-04-09 (×2): 1 [drp] via OPHTHALMIC
  Filled 2013-04-08: qty 15

## 2013-04-08 MED ORDER — FERROUS SULFATE 325 (65 FE) MG PO TABS: 325.0000 mg | ORAL_TABLET | ORAL | Status: DC

## 2013-04-08 MED ORDER — L-METHYLFOLATE-B6-B12 3-35-2 MG PO TABS
1.0000 | ORAL_TABLET | Freq: Every day | ORAL | Status: DC
  Administered 2013-04-09: 1 via ORAL
  Filled 2013-04-08 (×3): qty 1

## 2013-04-08 MED ORDER — EZETIMIBE 10 MG PO TABS
10.0000 mg | ORAL_TABLET | Freq: Every day | ORAL | Status: DC
  Administered 2013-04-09: 10 mg via ORAL
  Filled 2013-04-08: qty 1

## 2013-04-08 MED ORDER — SODIUM CHLORIDE 0.9 % IJ SOLN: 3.0000 mL | Freq: Two times a day (BID) | INTRAMUSCULAR | Status: DC

## 2013-04-08 MED ORDER — POLYETHYL GLYCOL-PROPYL GLYCOL 0.4-0.3 % OP SOLN: 1.0000 [drp] | Freq: Two times a day (BID) | OPHTHALMIC | Status: DC

## 2013-04-08 MED ORDER — IBUPROFEN 400 MG PO TABS
200.0000 mg | ORAL_TABLET | Freq: Four times a day (QID) | ORAL | Status: DC | PRN
  Administered 2013-04-08: 200 mg via ORAL
  Filled 2013-04-08: qty 2

## 2013-04-08 MED ORDER — HYDRALAZINE HCL 20 MG/ML IJ SOLN: 5.0000 mg | INTRAMUSCULAR | Status: DC | PRN

## 2013-04-08 MED ORDER — SODIUM CHLORIDE 0.9 % IV SOLN
INTRAVENOUS | Status: AC
  Administered 2013-04-08: 18:00:00 via INTRAVENOUS

## 2013-04-08 MED ORDER — ASPIRIN EC 81 MG PO TBEC
81.0000 mg | DELAYED_RELEASE_TABLET | Freq: Every day | ORAL | Status: DC
  Administered 2013-04-09: 81 mg via ORAL
  Filled 2013-04-08: qty 1

## 2013-04-08 NOTE — H&P (Addendum)
[facility] History and Physical  Pamela [name] WUJ:[id number] DOB: Aug 18, 1920 DOA: 04/08/2013  Referring physician:  PCP: Kirk Ruths, MD  Specialists:   Chief Complaint: fatigue, weakness  HPI: Pamela [name] is a delightful 79 y.o. female with a past medical history that includes hypertension, hyperlipidemia, cardiac dysrhythmia status post placement of cardiac pacemaker on 08/29/2012, chronic ischemic heart disease, PAT, peripheral neuropathy who presents to the emergency department with the chief complaint of weakness. Information is obtained from the patient and her son who is at the bedside. This indicates that she has suffered generalized weakness and extreme fatigue over the last several weeks. She says she saw cardiologist in may of this year or and everything was "okay. She states that over the last couple of days he's also developed some dizziness particularly with position changes. She went to visit her primary care provider today and her primary care provider sent her to the emergency room. She denies any chest pain palpitation shortness of breath cough headache visual disturbances. She denies any abdominal pain nausea vomiting diarrhea constipation melena dysuria hematuria frequency or urgency. She indicates that she's been eating her normal amount and has not had any unintentional weight loss. She reports never feeling her pacemaker firing. Indicates there is been no change in any of her medications recently. In the emergency room her blood pressure was 221/85 and her heart rate was 61. She was given 10 mg of labetalol and her blood pressure came down to 164/71. Lab work in the emergency room was unremarkable. CT of the head yielded no evidence of brain mass brain hemorrhage or acute infarction. There was no acute or active brain process seen. No skull lesion evident. Chest x-ray yielded a borderline heart size but no acute findings or change. Urinalysis was negative for any  sign of infection. Symptoms came on gradually have persisted characterized as moderate. [facility] are asked to admit   Review of Systems: 14 point review of systems reviewed and negative except as indicated in history of present illness  Past Medical History  Diagnosis Date  . Pacemaker 08/29/2012    st jude accent DR RF device, model number [id number], serial number [id number], implanted 08/29/2012 for sinus bradycardia, runs of supraventricular tachycardia  last checked 10/23/2012  . ICD (implantable cardiac defibrillator) in place   . SSS (sick sinus syndrome) with PAT and marked bradycardia with 3 sec pauses  08/30/2012  . Status post placement of cardiac pacemaker. 08/29/12 St. Jude device 08/30/2012  . HTN (hypertension) 08/30/2012  . Hyperlipidemia 08/30/2012  . Polyneuropathy in other diseases classified elsewhere   . Macular degeneration   . Coronary artery disease     stent in the LAD artery in 2002  . Venous insufficiency    Past Surgical History  Procedure Laterality Date  . Cardiac catheterization  2006    3 stents placed  . Insert / replace / remove pacemaker  08/29/2012    st jude   . Rotator cuff repair Left   . Abdominal hysterectomy    . Cesarean section    . Kidney stone surgery    . Rotator cuff repair Right   . Cardiac catheterization  06/2001    placement of BiodivYsio 2.5.10mm stent dilated to 2.75mm in proximal left anterior descenting stenotic lesion  . Radiofrequency ablation  08/25/2010    ablation of left greater saphenous vein   Social History:  reports that she has never smoked. She does not have any smokeless tobacco history  on file. She reports that she does not drink alcohol or use illicit drugs. Patient is a widow she lives alone she is a retired Human resources officer. She is independent with her ADLs. She has 2 sons who live nearby No Known Allergies  Family History  Problem Relation Age of Onset  . Heart disease Mother   . Congestive Heart Failure  Mother   . Heart disease Father   . Heart attack Father   . Heart disease Brother      Prior to Admission medications   Medication Sig Start Date End Date Taking? Authorizing Provider  acetaminophen (TYLENOL) 325 MG tablet Take 650 mg by mouth every 6 (six) hours as needed for pain.   Yes Historical Provider, MD  aspirin EC 81 MG tablet Take 81 mg by mouth daily.   Yes Historical Provider, MD  atorvastatin (LIPITOR) 20 MG tablet Take 20 mg by mouth daily.    Yes Historical Provider, MD  Cholecalciferol (VITAMIN D-3 PO) Take 1 capsule by mouth daily.   Yes Historical Provider, MD  ezetimibe (ZETIA) 10 MG tablet Take 10 mg by mouth daily.   Yes Historical Provider, MD  ferrous sulfate 325 (65 FE) MG tablet Take 325 mg by mouth every other day.    Yes Historical Provider, MD  furosemide (LASIX) 20 MG tablet Take 20 mg by mouth daily as needed. For fluid retention   Yes Historical Provider, MD  ibuprofen (ADVIL,MOTRIN) 200 MG tablet Take 200 mg by mouth every 6 (six) hours as needed for pain.   Yes Historical Provider, MD  l-methylfolate-B6-B12 (METANX) 3-35-2 MG TABS Take 1 tablet by mouth daily.   Yes Historical Provider, MD  meloxicam (MOBIC) 15 MG tablet Take 15 mg by mouth daily as needed. For inflamation   Yes Historical Provider, MD  Multiple Vitamins-Minerals (OCUVITE PO) Take 1 tablet by mouth 2 (two) times daily.   Yes Historical Provider, MD  nebivolol (BYSTOLIC) 5 MG tablet Take 1 tablet (5 mg total) by mouth daily. 12/25/12  Yes Mihai Croitoru, MD  Polyethyl Glycol-Propyl Glycol (SYSTANE OP) Apply 1 drop to eye 2 (two) times daily as needed (Dry Eyes).    Yes Historical Provider, MD  ramipril (ALTACE) 2.5 MG tablet Take 2.5 mg by mouth daily.   Yes Historical Provider, MD   Physical Exam: Filed Vitals:   04/08/13 1450  BP: 168/93  Pulse: 60  Temp:   Resp: 20     General:  Well-nourished somewhat pale no acute distress  Eyes: PERRLA, EOMI, no scleral icterus  ENT: Is clear  nose without drainage oropharynx without erythema or exudate. His membranes of her mouth slightly pale but moist  Neck: Supple no lymphadenopathy no indication of increased venous pressure  Cardiovascular: Heart sounds slightly distant but regular no murmur no gallop no lower extremity edema  Respiratory: Normal effort breath sounds clear bilaterally consultation no wheeze no rhonchi  Abdomen: Soft positive bowel sounds nontender to palpation no mass organomegaly   Skin: Slightly pale warm and dry no rash no lesions  Musculoskeletal: No clubbing no cyanosis  Psychiatric: Calm cooperative  Neurologic: Alert and oriented x3 speech clear facial symmetry cranial nerves II through XII grossly intact  Labs on Admission:  Basic Metabolic Panel:  Recent Labs Lab 04/08/13 1334  NA 144  K 3.6  CL 106  CO2 28  GLUCOSE 100*  BUN 17  CREATININE 0.77  CALCIUM 10.6*   Liver Function Tests: No results found for this basename: AST,  ALT, ALKPHOS, BILITOT, PROT, ALBUMIN,  in the last 168 hours No results found for this basename: LIPASE, AMYLASE,  in the last 168 hours No results found for this basename: AMMONIA,  in the last 168 hours CBC:  Recent Labs Lab 04/08/13 1334  WBC 5.0  HGB 13.9  HCT 43.4  MCV 94.1  PLT 159   Cardiac Enzymes:  Recent Labs Lab 04/08/13 1334  TROPONINI <0.30    BNP (last 3 results)  Recent Labs  04/08/13 1334  PROBNP 533.7*   CBG: No results found for this basename: GLUCAP,  in the last 168 hours  Radiological Exams on Admission: Dg Chest 1 View  04/08/2013   CLINICAL DATA:  Weakness, dizziness.  EXAM: CHEST - 1 VIEW  COMPARISON:  08/30/2012  FINDINGS: Left dual lead pacer in place, unchanged. Heart is borderline in size. No confluent airspace opacities or effusions. No acute bony abnormality.  IMPRESSION: Borderline heart size. No acute findings or change.   Electronically Signed   By: Charlett Nose M.D.   On: 04/08/2013 14:36   Ct Head Wo  Contrast  04/08/2013   *RADIOLOGY REPORT*  Clinical Data:  Dizziness.  Generalized weakness.  CT HEAD WITHOUT CONTRAST  Technique:  Contiguous axial images were obtained from the base of the skull through the vertex without contrast.  Comparison:  The none  Findings:  There is no evidence of brain mass, brain hemorrhage, or acute infarction.  The ventricular system is normal size and shape.  There is no evidence of shift of midline structures or subdural or epidural hematoma.  There is prominence of the cortical sulci and fissures consistent with atrophy.  Minimal periventricular and white matter hypodensities are seen consistent with white matter degeneration most likely representing chronic microvascular ischemic changes.  Nonaneurysmal calcifications of the right vertebral artery and the left internal carotid artery are present.  The calvarium is intact.  Mastoids are well aerated.  No sinusitis is evident.  IMPRESSION: There is no evidence of brain mass, brain hemorrhage, or acute infarction. Cerebral atrophic changes are seen. Minimal periventricular and white matter hypodensities are seen consistent with white matter degeneration most likely representing chronic microvascular ischemic changes. Nonaneurysmal calcifications of the right vertebral artery and the left internal carotid artery are present.  No acute or active brain process is seen.  No skull lesion is evident.  No sinusitis is evident.   Original Report Authenticated By: Onalee Hua Call    EKG: Normal sinus rhythm  Assessment/Plan Principal Problem:   Hypertensive emergency: Etiology unclear. Patient denies any change in her medications of late. She was given 10 mg of labetalol IV in the emergency room. Blood pressure at time of my exam 168/93. Will admit to telemetry. Will check vitals every 4x3. I medications include Bystolic, Lasix, ALT face. Will resume all takes. Will provide hydralazine when necessary.  Active Problems:   Status post  placement of cardiac pacemaker. 08/29/12 St. Jude device: Chart review indicates patient seen by cardiology in may of this year at which time device was evaluated and deemed normal function. Will request cardiology consult.    HTN (hypertension): See 1.    Hyperlipidemia: Check a lipid panel. Continue her home    Cardiac dysrhythmia, unspecified: Patient with history of PAT. Chart review indicates that her visit with cardiologist in may of this year yielded no atrial fibrillation had been detected by her device sense of SVT were brief maximum and 36 seconds and not likely associated with her complaints  of fatigue or dyspnea at that time.    Chronic ischemic heart disease, unspecified: Will check 2-D echo monitor intake and output daily weights. Patient is on Lasix 20 mg to be used as an when necessary basis. She indicates that she takes pretty much daily.    Dizzy/fatigue: Etiology uncertain. Will check orthostatics. This and does not appear to be dehydrated. Gently hydrate. Will hold her Lasix for now. CT of the head as above. Neuro exam benign. Chart review indicates patient has been complaining of generalized fatigue for several months. Will check her TSH. Request PT consult    cardiology Code Status: DNR Family Communication: son at bedside Disposition Plan: home when ready Time spent: 59 minutes  Gwenyth Bender [facility] Pager [phone]  If 7PM-7AM, please contact night-coverage [email or web address] Password [facility] 04/08/2013, 4:00 PM Attending: Patient seen and examined. The patient describes lightheadedness after she sat down at her doctor's office examination room. She has never experienced this before. She does feel better at the present time. She does not give any history of diarrhea, chest pain, palpitations, vomiting or dyspnea. Her physical examination is fairly benign. There is no aortic systolic murmur. Neurological examination is unremarkable. She does not look clinically  dehydrated. Agree with cardiology consultation for further recommendations. Her uncontrolled hypertension, I suppose, could account for some of her symptoms.

## 2013-04-08 NOTE — ED Notes (Signed)
Pt reports didn't sleep well last night, went to pmd today and started "feeling weak all over".  No neuro deficits. Alert and able to answer all ?'s

## 2013-04-08 NOTE — ED Provider Notes (Addendum)
CSN: [id number]     Arrival date & time 04/08/13  1306 History  This chart was scribed for Pamela Hait, MD by Caryn Bee and Ardelia Mems, ED Scribes. This patient was seen in room APA02/APA02 and the patient's care was started 1:18 PM.    Chief Complaint  Patient presents with  . Weakness   Patient is a 79 y.o. female presenting with weakness. The history is provided by the patient. No language interpreter was used.  Weakness This is a new problem. The current episode started 3 to 5 hours ago. The problem occurs rarely. The problem has been gradually improving. Pertinent negatives include no chest pain, no abdominal pain, no headaches and no shortness of breath. The symptoms are aggravated by standing. The symptoms are relieved by lying down. She has tried nothing for the symptoms. The treatment provided no relief.   HPI Comments: Pamela [name] is a 79 y.o. Female with h/o HTN and pacemaker who presents to the Emergency Department complaining of generalized weakness over the past few days and sudden onset, constant dizziness that began when she went to visit her PCP today, about 3 hours ago. Pt states the dizziness is currently improving. She reports that lying down improves the dizziness. She states movement and walking do not affect dizziness. Pt describes the dizziness as "whoozy" sensation. Pt denies similar symptoms in the past. She denies feeling dehydrated. She denies having a history of irregular heart rhythm, CVAs or UTIs. Pt states she had a stent placement a few months ago. Pt reports never feeling her pacemaker firing. Pt states she has been taking medication for HTN regularly. Pt denies abdominal pain, nausea, vomiting, chest pain, difficulty breathing, numbness or focal weakness in her extremities or any other symptoms. Pt's blood sugar was 127 per EMS.   Pt's PCP is Dr. Regino Schultze   Past Medical History  Diagnosis Date  . Pacemaker 08/29/2012    st jude accent DR RF  device, model number [id number], serial number [id number], implanted 08/29/2012 for sinus bradycardia, runs of supraventricular tachycardia  last checked 10/23/2012  . ICD (implantable cardiac defibrillator) in place   . SSS (sick sinus syndrome) with PAT and marked bradycardia with 3 sec pauses  08/30/2012  . Status post placement of cardiac pacemaker. 08/29/12 St. Jude device 08/30/2012  . HTN (hypertension) 08/30/2012  . Hyperlipidemia 08/30/2012  . Polyneuropathy in other diseases classified elsewhere   . Macular degeneration   . Coronary artery disease     stent in the LAD artery in 2002  . Venous insufficiency    Past Surgical History  Procedure Laterality Date  . Cardiac catheterization  2006    3 stents placed  . Insert / replace / remove pacemaker  08/29/2012    st jude   . Rotator cuff repair Left   . Abdominal hysterectomy    . Cesarean section    . Kidney stone surgery    . Rotator cuff repair Right   . Cardiac catheterization  06/2001    placement of BiodivYsio 2.5.10mm stent dilated to 2.72mm in proximal left anterior descenting stenotic lesion  . Radiofrequency ablation  08/25/2010    ablation of left greater saphenous vein   Family History  Problem Relation Age of Onset  . Heart disease Mother   . Congestive Heart Failure Mother   . Heart disease Father   . Heart attack Father   . Heart disease Brother    History  Substance Use Topics  .  Smoking status: Never Smoker   . Smokeless tobacco: Not on file  . Alcohol Use: No   OB History   Grav Para Term Preterm Abortions TAB SAB Ect Mult Living                 Review of Systems  Constitutional: Negative for appetite change and fatigue.  HENT: Negative for congestion, sinus pressure and ear discharge.   Eyes: Negative for discharge.  Respiratory: Negative for cough and shortness of breath.   Cardiovascular: Negative for chest pain.  Gastrointestinal: Negative for nausea, vomiting, abdominal pain and diarrhea.   Genitourinary: Negative for frequency and hematuria.  Musculoskeletal: Negative for back pain.  Skin: Negative for rash.  Neurological: Positive for dizziness and weakness. Negative for seizures, numbness and headaches.  Psychiatric/Behavioral: Negative for hallucinations.  All other systems reviewed and are negative.   Allergies  Review of patient's allergies indicates no known allergies.  Home Medications   Current Outpatient Rx  Name  Route  Sig  Dispense  Refill  . acetaminophen (TYLENOL) 325 MG tablet   Oral   Take 650 mg by mouth every 6 (six) hours as needed for pain.         Marland Kitchen aspirin EC 81 MG tablet   Oral   Take 81 mg by mouth daily.         Marland Kitchen atorvastatin (LIPITOR) 20 MG tablet   Oral   Take 10-20 mg by mouth daily. Alternate doses of 10mg  and 20mg  each day         . Cholecalciferol (VITAMIN D-3 PO)   Oral   Take 1 capsule by mouth daily.         Marland Kitchen ezetimibe (ZETIA) 10 MG tablet   Oral   Take 10 mg by mouth daily.         . ferrous sulfate 325 (65 FE) MG tablet   Oral   Take 325 mg by mouth every other day.          . furosemide (LASIX) 20 MG tablet   Oral   Take 20 mg by mouth daily as needed. For fluid retention         . HYDROcodone-acetaminophen (NORCO/VICODIN) 5-325 MG per tablet   Oral   Take 1-2 tablets by mouth every 4 (four) hours as needed for pain.   15 tablet   0   . l-methylfolate-B6-B12 (METANX) 3-35-2 MG TABS   Oral   Take 1 tablet by mouth daily.         . meloxicam (MOBIC) 15 MG tablet   Oral   Take 15 mg by mouth daily as needed. For inflamation         . Multiple Vitamins-Minerals (OCUVITE PO)   Oral   Take 1 tablet by mouth 2 (two) times daily.         . nebivolol (BYSTOLIC) 5 MG tablet   Oral   Take 1 tablet (5 mg total) by mouth daily.   30 tablet   6   . Polyethyl Glycol-Propyl Glycol (SYSTANE OP)   Ophthalmic   Apply 1 drop to eye 2 (two) times daily.         . ramipril (ALTACE) 2.5 MG  tablet   Oral   Take 2.5 mg by mouth daily.           Triage Vitals: BP 164/71  Pulse 62  Temp(Src) 98.7 F (37.1 C) (Oral)  Resp 26  SpO2 97%  Physical Exam  Nursing note and vitals reviewed. Constitutional: She is oriented to person, place, and time. She appears well-developed and well-nourished. No distress.  HENT:  Head: Normocephalic and atraumatic.  Eyes: Conjunctivae and EOM are normal. Pupils are equal, round, and reactive to light.  Neck: Neck supple. No tracheal deviation present.  Cardiovascular: Normal rate, regular rhythm and normal heart sounds.   Pulmonary/Chest: Effort normal and breath sounds normal. No respiratory distress.  Abdominal: Soft.  Musculoskeletal: Normal range of motion.  Neurological: She is alert and oriented to person, place, and time.  Skin: Skin is warm and dry.  Psychiatric: She has a normal mood and affect. Her behavior is normal.    ED Course  Procedures (including critical care time) DIAGNOSTIC STUDIES: Oxygen Saturation is % on 98%, normal by my interpretation.    COORDINATION OF CARE: 1:30 PM-Discussed treatment plan which includes ordering CXR, CT on the head, CBC Stat, UA, BNP, basic metabolic panel, and troponin I with pt at bedside and pt agreed to plan. Also discussed ordering medication for the pt's blood pressure. Discussed the possibility of admitting pt and pt agreed.  3:06 PM- Recheck with pt. She states that she feels slightly better.  Medications  labetalol (NORMODYNE,TRANDATE) injection 10 mg (10 mg Intravenous Given 04/08/13 1347)   Labs Review Labs Reviewed  BASIC METABOLIC PANEL - Abnormal; Notable for the following:    Glucose, Bld 100 (*)    Calcium 10.6 (*)    GFR calc non Af Amer 72 (*)    GFR calc Af Amer 83 (*)    All other components within normal limits  PRO B NATRIURETIC PEPTIDE - Abnormal; Notable for the following:    Pro B Natriuretic peptide (BNP) 533.7 (*)    All other components within normal  limits  CBC  TROPONIN I  URINALYSIS, ROUTINE W REFLEX MICROSCOPIC   Imaging Review Dg Chest 1 View  04/08/2013   CLINICAL DATA:  Weakness, dizziness.  EXAM: CHEST - 1 VIEW  COMPARISON:  08/30/2012  FINDINGS: Left dual lead pacer in place, unchanged. Heart is borderline in size. No confluent airspace opacities or effusions. No acute bony abnormality.  IMPRESSION: Borderline heart size. No acute findings or change.   Electronically Signed   By: Charlett Nose M.D.   On: 04/08/2013 14:36   Ct Head Wo Contrast  04/08/2013   *RADIOLOGY REPORT*  Clinical Data:  Dizziness.  Generalized weakness.  CT HEAD WITHOUT CONTRAST  Technique:  Contiguous axial images were obtained from the base of the skull through the vertex without contrast.  Comparison:  The none  Findings:  There is no evidence of brain mass, brain hemorrhage, or acute infarction.  The ventricular system is normal size and shape.  There is no evidence of shift of midline structures or subdural or epidural hematoma.  There is prominence of the cortical sulci and fissures consistent with atrophy.  Minimal periventricular and white matter hypodensities are seen consistent with white matter degeneration most likely representing chronic microvascular ischemic changes.  Nonaneurysmal calcifications of the right vertebral artery and the left internal carotid artery are present.  The calvarium is intact.  Mastoids are well aerated.  No sinusitis is evident.  IMPRESSION: There is no evidence of brain mass, brain hemorrhage, or acute infarction. Cerebral atrophic changes are seen. Minimal periventricular and white matter hypodensities are seen consistent with white matter degeneration most likely representing chronic microvascular ischemic changes. Nonaneurysmal calcifications of the right vertebral artery and the left internal carotid artery  are present.  No acute or active brain process is seen.  No skull lesion is evident.  No sinusitis is evident.   Original  Report Authenticated By: Onalee Hua Call     Date: 04/08/2013  Rate: 65  Rhythm: normal sinus rhythm  QRS Axis: normal  Intervals: normal  ST/T Wave abnormalities: nonspecific ST changes  Conduction Disutrbances:none  Narrative Interpretation:   Old EKG Reviewed: unchanged  FAST BEDSIDE US Indication: dizziness - possible aortic rupture  4 Views obtained: Splenorenal, Morrison's Pouch, Retrovesical, Pericardial No free fluid in abdomen No pericardial effusion No difficulty obtaining views. No images archived I personally performed and interrepreted the images  CRITICAL CARE Performed by: Pamela [name]  ?  Total critical care time: 30 minutes Critical care time was exclusive of separately billable procedures and treating other patients.  Critical care was necessary to treat or prevent imminent or life-threatening deterioration.  Critical care was time spent personally by me on the following activities: development of treatment plan with patient and/or surrogate as well as nursing, discussions with consultants, evaluation of patient's response to treatment, examination of patient, obtaining history from patient or surrogate, ordering and performing treatments and interventions, ordering and review of laboratory studies, ordering and review of radiographic studies, pulse oximetry and re-evaluation of patient's condition.   MDM   1. Dizziness   2. Hypertensive emergency    79 year old female presents with weakness and dizziness. Weakness for several days. Presented to her PCPs office today for worsening difficulty. The dizziness is described as woozy has been constant. It is improving, however no noted alleviating factors present other than resting. She denies any chest pain, shortness of breath, fevers, nausea vomiting diarrhea, urinary symptoms. Per EMS, they were told she had a junctional rhythm at the PCPs office. She's been in sinus rhythm with EMS and so far here. She is  normal sinus rhythm heart rate in the 60s the monitor. Has normal O2 sat with good waveform. On exam, she has no abdominal tenderness. She has distended lower abdomen consistent with a full bladder. She has normal heart and lung sounds. She is moving all extremities is intact cranial nerves. Bedside ultrasound shows normal FAST exam and normal aorta exam. She has good cardiac motion. Will scan her head and obtain labs. Has a pacemaker - cannot get MRI for her dizziness. With improving symptoms, not a candidate for a code stroke. Will check cardiac and urine studies. Normal initial EKG. Expect admission.  IVP Labetalol given for her blood pressure. After Head CT, BP is improved, no need for blood pressure medication drip at this time. Dizziness improved - still there, but patient appears more comfortable and she states she is feeling somewhat better. With dizziness and elevated blood pressure, concern for end-organ damage from her hypertension. Labs show elevated BNP. Record review without prior BNP to compare to. Patient admitted by Dr. Karilyn Cota.  I personally performed the services described in this documentation, which was scribed in my presence. The recorded information has been reviewed and is accurate.      Pamela Hait, MD 04/08/13 1537

## 2013-04-09 DIAGNOSIS — I471 Supraventricular tachycardia: Secondary | ICD-10-CM

## 2013-04-09 DIAGNOSIS — I495 Sick sinus syndrome: Secondary | ICD-10-CM

## 2013-04-09 DIAGNOSIS — I499 Cardiac arrhythmia, unspecified: Secondary | ICD-10-CM

## 2013-04-09 DIAGNOSIS — Z950 Presence of cardiac pacemaker: Secondary | ICD-10-CM

## 2013-04-09 DIAGNOSIS — I359 Nonrheumatic aortic valve disorder, unspecified: Secondary | ICD-10-CM

## 2013-04-09 DIAGNOSIS — I259 Chronic ischemic heart disease, unspecified: Secondary | ICD-10-CM

## 2013-04-09 DIAGNOSIS — R42 Dizziness and giddiness: Secondary | ICD-10-CM

## 2013-04-09 DIAGNOSIS — I10 Essential (primary) hypertension: Secondary | ICD-10-CM

## 2013-04-09 LAB — LIPID PANEL
HDL: 63 mg/dL (ref >39)
Total CHOL/HDL Ratio: 2.5 RATIO
Triglycerides: 98 mg/dL (ref <150)

## 2013-04-09 LAB — GLUCOSE, CAPILLARY

## 2013-04-09 MED ORDER — ATORVASTATIN CALCIUM 20 MG PO TABS: 20.0000 mg | ORAL_TABLET | Freq: Every day | ORAL | Status: DC

## 2013-04-09 NOTE — Progress Notes (Signed)
Patient discharged home, [facility] PT to follow.  IV removed- WNL.  Instructed to make F/U with PCP in 1 week for eval of symptoms.  No changes to meds made.  Instructed to follow low sodium heart healthy diet - carenotes given and pt able to verbalize understanding. No questions at this time.  Taking walker brought by [facility].  Pt left floor in stable condition via WC with NT.

## 2013-04-09 NOTE — Evaluation (Signed)
Physical Therapy Evaluation Patient Details Name: Pamela [name] MRN: [id number] DOB: 1920-12-27 Today's Date: 04/09/2013 Time: 4098-1191 PT Time Calculation (min): 32 min  PT Assessment / Plan / Recommendation History of Present Illness  Pt is admitted for gradual onset of generalized weakness and acute onset of dizziness.  Pt has a pacemaker and is normally active and independent at home.  Her son lives "around the corner".  Clinical Impression   Pt is seen for evaluation.  She has significant generalized weakness and some standing balance dysfunction which puts her in a high level of fall risk.  Her daughter-in-law states that she has seen a decline in pt's overall activity level and conditioning over the past year.  Pt seems to be in some denial as to her unsteady gait and tends to be resistant to using a walker, however in my opinion she needs to use one for gait.  I did instruct her in the use of the walker and would recommend that she have HHPT at d/c.    PT Assessment  All further PT needs can be met in the next venue of care    Follow Up Recommendations  Home health PT    Does the patient have the potential to tolerate intense rehabilitation      Barriers to Discharge        Equipment Recommendations  Rolling walker with 5" wheels    Recommendations for Other Services     Frequency      Precautions / Restrictions Precautions Precautions: Fall Restrictions Weight Bearing Restrictions: No   Pertinent Vitals/Pain       Mobility  Bed Mobility Bed Mobility: Supine to Sit;Sit to Supine Supine to Sit: 5: Supervision Sit to Supine: HOB flat;5: Supervision Details for Bed Mobility Assistance: no assist needed but labored Transfers Transfers: Sit to Stand;Stand to Sit Sit to Stand: 5: Supervision;From bed Stand to Sit: 5: Supervision;To bed Ambulation/Gait Ambulation/Gait Assistance: 5: Supervision Ambulation Distance (Feet): 175 Feet Assistive device: Rolling  walker;None Gait Pattern: Step-through pattern;Decreased stride length;Narrow base of support;Trunk flexed;Decreased hip/knee flexion - left;Decreased hip/knee flexion - right Gait velocity: slow and hesitant General Gait Details: instructed in lengthening step length and keeping head erect Stairs: No Wheelchair Mobility Wheelchair Mobility: No    Exercises     PT Diagnosis: Difficulty walking;Abnormality of gait;Generalized weakness  PT Problem List: Decreased strength;Decreased activity tolerance;Decreased balance;Decreased mobility;Decreased knowledge of use of DME PT Treatment Interventions:       PT Goals(Current goals can be found in the care plan section) Acute Rehab PT Goals Patient Stated Goal: return home to full independence PT Goal Formulation: No goals set, d/c therapy  Visit Information  Last PT Received On: 04/09/13 History of Present Illness: Pt is admitted for gradual onset of generalized weakness and acute onset of dizziness.  Pt has a pacemaker and is normally active and independent at home.  Her son lives "around the corner".       Prior Functioning  Home Living Family/patient expects to be discharged to:: Private residence Living Arrangements: Alone Available Help at Discharge: Family;Available PRN/intermittently Type of Home: House Home Access: Stairs to enter [employer] of Steps: 2 Entrance Stairs-Rails: None Home Layout: One level Home Equipment: Grab bars - tub/shower;Grab bars - toilet;Shower seat;Toilet riser Prior Function Level of Independence: Independent Communication Communication: No difficulties    Cognition  Cognition Arousal/Alertness: Awake/alert Behavior During Therapy: WFL for tasks assessed/performed Overall Cognitive Status: Within Functional Limits for tasks assessed  Extremity/Trunk Assessment Lower Extremity Assessment Lower Extremity Assessment: Generalized weakness (weakness is significant...3-/5 hip and  knee, 2/5 at ankles) Cervical / Trunk Assessment Cervical / Trunk Assessment: Kyphotic   Balance Balance Balance Assessed: Yes Dynamic Sitting Balance Dynamic Sitting - Balance Support: No upper extremity supported;Feet supported Dynamic Sitting - Level of Assistance: 6: Modified independent (Device/Increase time) Static Standing Balance Static Standing - Balance Support: No upper extremity supported Static Standing - Level of Assistance: 5: Stand by assistance  End of Session PT - End of Session Equipment Utilized During Treatment: Gait belt Activity Tolerance: Patient tolerated treatment well Patient left: in bed;with call bell/phone within reach;with family/visitor present  GP     Myrlene Broker L 04/09/2013, 10:41 AM

## 2013-04-09 NOTE — Care Management Note (Signed)
    Page 1 of 1   04/09/2013     12:28:03 PM   CARE MANAGEMENT NOTE 04/09/2013  Patient:  Pamela [name], Pamela [name]   Account Number:  [id number]  Date Initiated:  04/09/2013  Documentation initiated by:  Rosemary Holms  Subjective/Objective Assessment:   Pt admitted from home where she lives alone. Pt receptive to [facility] PT and a RW from [facility]. Alroy Bailiff notified of both referrals     Action/Plan:   Anticipated DC Date:  04/09/2013   Anticipated DC Plan:  HOME W HOME HEALTH SERVICES      DC Planning Services  CM consult      Choice offered to / List presented to:     DME arranged  Levan Hurst      DME agency  [facility]     HH arranged  [facility] PT      [facility] agency  [facility]   Status of service:  Completed, signed off Medicare Important Message given?   (If response is "NO", the following Medicare IM given date fields will be blank) Date Medicare IM given:   Date Additional Medicare IM given:    Discharge Disposition:  HOME W HOME HEALTH SERVICES  Per UR Regulation:    If discussed at Long Length of Stay Meetings, dates discussed:    Comments:  04/09/13 Rosemary Holms RN BSN CM

## 2013-04-09 NOTE — Discharge Summary (Signed)
Patient seen and examined. Agree with discharge assessment and plan. No clear etiology of her hypertensive urgency. Blood pressure has been fairly stable while in the hospital and will be discharged on home pressure medications. Patient also has sick sinus syndrome PAT and bradycardia it is post pacemaker which was interrogated by cardiology. Repeat echo was ordered but appears patient left home without the echo and should be done as outpatient.

## 2013-04-09 NOTE — Consult Note (Signed)
The patient was seen and examined, and I agree with the assessment and plan as documented above. Pt appears to be quite stable from a cardiovascular standpoint. Will have pacemaker interrogated today to evaluate for any atrial tachyarrhythmias. BP under better control now. No additional recommendations at this time.

## 2013-04-09 NOTE — Consult Note (Signed)
Reason for Consult:dizziness/pacemaker Referring Physician: [name] is an 79 y.o. female.  HPI:  This is a 79 y.o. Female patient of Dr. Royann [name] who has history of SSS with PAT and marked bradycardia S/P St. Jude PTVDP 07/2012 that was functioning normally at follow up in May 2014. She also has CAD athrectomy and Stent LAD 2002. Low risk nuclear scan in 2010, normal LV function 2Decho 2011.  She went to her primary care yesterday for problems with constipation and was found to be hypertensive BP 221/85,and then became dizzy in their office. She was sent to ER.She complains of generalized weakness, which didn't improve with pacer placement. She denies any palpitations, chest pain, dizziness or presyncope. She lives alone, drives, exercises everyday and remains very active. She watches her diet closely and denies excessive sodium.BNP was slightly elevated.  Past Medical History  Diagnosis Date  . Pacemaker 08/29/2012    st jude accent DR RF device, model number [id number], serial number [id number], implanted 08/29/2012 for sinus bradycardia, runs of supraventricular tachycardia  last checked 10/23/2012  . ICD (implantable cardiac defibrillator) in place   . SSS (sick sinus syndrome) with PAT and marked bradycardia with 3 sec pauses  08/30/2012  . Status post placement of cardiac pacemaker. 08/29/12 St. Jude device 08/30/2012  . HTN (hypertension) 08/30/2012  . Hyperlipidemia 08/30/2012  . Polyneuropathy in other diseases classified elsewhere   . Macular degeneration   . Coronary artery disease     stent in the LAD artery in 2002  . Venous insufficiency     Past Surgical History  Procedure Laterality Date  . Cardiac catheterization  2006    3 stents placed  . Insert / replace / remove pacemaker  08/29/2012    st jude   . Rotator cuff repair Left   . Abdominal hysterectomy    . Cesarean section    . Kidney stone surgery    . Rotator cuff repair Right   . Cardiac catheterization   06/2001    placement of BiodivYsio 2.5.10mm stent dilated to 2.68mm in proximal left anterior descenting stenotic lesion  . Radiofrequency ablation  08/25/2010    ablation of left greater saphenous vein    Family History  Problem Relation Age of Onset  . Heart disease Mother   . Congestive Heart Failure Mother   . Heart disease Father   . Heart attack Father   . Heart disease Brother     Social History:  reports that she has never smoked. She does not have any smokeless tobacco history on file. She reports that she does not drink alcohol or use illicit drugs.  Allergies: No Known Allergies  Medications: Scheduled Meds: . aspirin EC  81 mg Oral Daily  . atorvastatin  20 mg Oral Daily  . enoxaparin (LOVENOX) injection  40 mg Subcutaneous Q24H  . ezetimibe  10 mg Oral Daily  . ferrous sulfate  325 mg Oral QODAY  . l-methylfolate-B6-B12  1 tablet Oral Daily  . polyvinyl alcohol  1 drop Both Eyes BID  . sodium chloride  3 mL Intravenous Q12H   Continuous Infusions:  PRN Meds:.hydrALAZINE, ibuprofen   Results for orders placed during the hospital encounter of 04/08/13 (from the past 48 hour(s))  CBC     Status: None   Collection Time    04/08/13  1:34 PM      Result Value Range   WBC 5.0  4.0 - 10.5 K/uL   RBC 4.61  3.87 -  5.11 MIL/uL   Hemoglobin 13.9  12.0 - 15.0 g/dL   HCT 16.1  09.6 - 04.5 %   MCV 94.1  78.0 - 100.0 fL   MCH 30.2  26.0 - 34.0 pg   MCHC 32.0  30.0 - 36.0 g/dL   RDW 40.9  81.1 - 91.4 %   Platelets 159  150 - 400 K/uL  BASIC METABOLIC PANEL     Status: Abnormal   Collection Time    04/08/13  1:34 PM      Result Value Range   Sodium 144  135 - 145 mEq/L   Potassium 3.6  3.5 - 5.1 mEq/L   Chloride 106  96 - 112 mEq/L   CO2 28  19 - 32 mEq/L   Glucose, Bld 100 (*) 70 - 99 mg/dL   BUN 17  6 - 23 mg/dL   Creatinine, Ser 7.82  0.50 - 1.10 mg/dL   Calcium 95.6 (*) 8.4 - 10.5 mg/dL   GFR calc non Af Amer 72 (*) >90 mL/min   GFR calc Af Amer 83 (*) >90  mL/min   Comment: (NOTE)     The eGFR has been calculated using the CKD EPI equation.     This calculation has not been validated in all clinical situations.     eGFR's persistently <90 mL/min signify possible Chronic Kidney     Disease.  TROPONIN I     Status: None   Collection Time    04/08/13  1:34 PM      Result Value Range   Troponin I <0.30  <0.30 ng/mL   Comment:            Due to the release kinetics of cTnI,     a negative result within the first hours     of the onset of symptoms does not rule out     myocardial infarction with certainty.     If myocardial infarction is still suspected,     repeat the test at appropriate intervals.  PRO B NATRIURETIC PEPTIDE     Status: Abnormal   Collection Time    04/08/13  1:34 PM      Result Value Range   Pro B Natriuretic peptide (BNP) 533.7 (*) 0 - 450 pg/mL  TSH     Status: None   Collection Time    04/08/13  1:34 PM      Result Value Range   TSH 2.092  0.350 - 4.500 uIU/mL   Comment: Performed at [facility], ROUTINE W REFLEX MICROSCOPIC     Status: None   Collection Time    04/08/13  1:51 PM      Result Value Range   Color, Urine YELLOW  YELLOW   APPearance CLEAR  CLEAR   Specific Gravity, Urine 1.010  1.005 - 1.030   pH 6.0  5.0 - 8.0   Glucose, UA NEGATIVE  NEGATIVE mg/dL   Hgb urine dipstick NEGATIVE  NEGATIVE   Bilirubin Urine NEGATIVE  NEGATIVE   Ketones, ur NEGATIVE  NEGATIVE mg/dL   Protein, ur NEGATIVE  NEGATIVE mg/dL   Urobilinogen, UA 0.2  0.0 - 1.0 mg/dL   Nitrite NEGATIVE  NEGATIVE   Leukocytes, UA NEGATIVE  NEGATIVE   Comment: MICROSCOPIC NOT DONE ON URINES WITH NEGATIVE PROTEIN, BLOOD, LEUKOCYTES, NITRITE, OR GLUCOSE <1000 mg/dL.  TROPONIN I     Status: None   Collection Time    04/08/13  7:55 PM  Result Value Range   Troponin I <0.30  <0.30 ng/mL   Comment:            Due to the release kinetics of cTnI,     a negative result within the first hours     of the onset of  symptoms does not rule out     myocardial infarction with certainty.     If myocardial infarction is still suspected,     repeat the test at appropriate intervals.  TROPONIN I     Status: None   Collection Time    04/09/13  6:18 AM      Result Value Range   Troponin I <0.30  <0.30 ng/mL   Comment:            Due to the release kinetics of cTnI,     a negative result within the first hours     of the onset of symptoms does not rule out     myocardial infarction with certainty.     If myocardial infarction is still suspected,     repeat the test at appropriate intervals.    Dg Chest 1 View  04/08/2013   CLINICAL DATA:  Weakness, dizziness.  EXAM: CHEST - 1 VIEW  COMPARISON:  08/30/2012  FINDINGS: Left dual lead pacer in place, unchanged. Heart is borderline in size. No confluent airspace opacities or effusions. No acute bony abnormality.  IMPRESSION: Borderline heart size. No acute findings or change.   Electronically Signed   By: Charlett Nose M.D.   On: 04/08/2013 14:36   Ct Head Wo Contrast  04/08/2013   *RADIOLOGY REPORT*  Clinical Data:  Dizziness.  Generalized weakness.  CT HEAD WITHOUT CONTRAST  Technique:  Contiguous axial images were obtained from the base of the skull through the vertex without contrast.  Comparison:  The none  Findings:  There is no evidence of brain mass, brain hemorrhage, or acute infarction.  The ventricular system is normal size and shape.  There is no evidence of shift of midline structures or subdural or epidural hematoma.  There is prominence of the cortical sulci and fissures consistent with atrophy.  Minimal periventricular and white matter hypodensities are seen consistent with white matter degeneration most likely representing chronic microvascular ischemic changes.  Nonaneurysmal calcifications of the right vertebral artery and the left internal carotid artery are present.  The calvarium is intact.  Mastoids are well aerated.  No sinusitis is evident.   IMPRESSION: There is no evidence of brain mass, brain hemorrhage, or acute infarction. Cerebral atrophic changes are seen. Minimal periventricular and white matter hypodensities are seen consistent with white matter degeneration most likely representing chronic microvascular ischemic changes. Nonaneurysmal calcifications of the right vertebral artery and the left internal carotid artery are present.  No acute or active brain process is seen.  No skull lesion is evident.  No sinusitis is evident.   Original Report Authenticated By: Onalee Hua Call    ROS See HPI Eyes: Negative Ears:Negative for hearing loss, tinnitus Cardiovascular: Negative for chest pain, palpitations,irregular heartbeat, dyspnea, dyspnea on exertion, near-syncope, orthopnea, paroxysmal nocturnal dyspnea and syncope,edema, claudication, cyanosis,.  Respiratory:   Negative for cough, hemoptysis, shortness of breath, sleep disturbances due to breathing, sputum production and wheezing.   Endocrine: Negative for cold intolerance and heat intolerance.  Hematologic/Lymphatic: Negative for adenopathy and bleeding problem. Does not bruise/bleed easily.  Musculoskeletal: left arm muscle weakness and soreness, uses heating pad at night.   Gastrointestinal: Positive for recent  constipation and use of suppositories.Negative for nausea, vomiting, reflux, abdominal pain, diarrhea.   Genitourinary: Negative for bladder incontinence, dysuria, flank pain, frequency, hematuria, hesitancy, nocturia and urgency.  Neurological: Negative.  Allergic/Immunologic: Negative for environmental allergies.  Blood pressure 137/57, pulse 61, temperature 98.4 F (36.9 C), temperature source Oral, resp. rate 16, height 5\' 5"  (1.651 m), weight 166 lb 9.6 oz (75.569 kg), SpO2 98.00%. Physical Exam PHYSICAL EXAM: Well-nournished, in no acute distress. Neck: No JVD, HJR, Bruit, or thyroid enlargement Lungs: No tachypnea, clear without wheezing, rales, or  rhonchi Cardiovascular: RRR, PMI not displaced,2/6 systolic murmur at LSB, no gallops, bruit, thrill, or heave. Abdomen: BS normal. Soft without organomegaly, masses, lesions or tenderness. Extremities: without cyanosis, clubbing or edema. Good distal pulses bilateral SKin: Warm, no lesions or rashes  Musculoskeletal: No deformities Neuro: no focal signs    Assessment/Plan: 1. Hypertension: elevated yesterday but now controlled  2. SSS with PAT and bradycardia S/P St. Jude pacer 07/2012. Pacer checked 11/2012. Will have it checked today.  3.CAD S/P athrectomy and stent LAD 2002. No symptoms. Low risk nuclear scan 2010. Normal LV funciton 2011 on 2Decho. Repeat echo pending.  4. Hyperlipidemia: on Zetia and Lipitor   Jacolyn Reedy 04/09/2013, 8:28 AM

## 2013-04-09 NOTE — Discharge Summary (Signed)
Physician Discharge Summary  Pamela [name] UJW:[id number] DOB: 08-Feb-1921 DOA: 04/08/2013  PCP: Kirk Ruths, MD  Admit date: 04/08/2013 Discharge date: 04/09/2013  Time spent: 40 minutes  Recommendations for Outpatient Follow-up:  1. Recommend follow up with PCP 1 week for evaluation of symptoms 2. Home health PT with rolling walker   Discharge Diagnoses:  Principal Problem:   Hypertensive emergency Active Problems:   Status post placement of cardiac pacemaker. 08/29/12 St. Jude device   HTN (hypertension)   Hyperlipidemia   Cardiac dysrhythmia, unspecified   Chronic ischemic heart disease, unspecified   Dizzy   Discharge Condition: stable  Diet recommendation: heart healthy  Filed Weights   04/08/13 1627 04/09/13 0356  Weight: 71.668 kg (158 lb) 75.569 kg (166 lb 9.6 oz)    History of present illness:  Pamela [name] is a delightful 79 y.o. female with a past medical history that includes hypertension, hyperlipidemia, cardiac dysrhythmia status post placement of cardiac pacemaker on 08/29/2012, chronic ischemic heart disease, PAT, peripheral neuropathy who presented to the emergency department on 04/08/13 with the chief complaint of weakness. she indicates that she has suffered generalized weakness and extreme fatigue over the last several weeks. She said she saw cardiologist in may of this year or and everything was "okay. She stated that over the last couple of days she also developed some dizziness particularly with position changes. She went to visit her primary care provider on day of admission and her primary care provider sent her to the emergency room. She denied any chest pain palpitation shortness of breath cough headache visual disturbances. She denied any abdominal pain nausea vomiting diarrhea constipation melena dysuria hematuria frequency or urgency. She indicated that she'd been eating her normal amount and has not had any unintentional weight loss. She reported  never feeling her pacemaker firing. Indicated there is been no change in any of her medications recently. In the emergency room her blood pressure was 221/85 and her heart rate was 61. She was given 10 mg of labetalol and her blood pressure came down to 164/71. Lab work in the emergency room was unremarkable. CT of the head yielded no evidence of brain mass brain hemorrhage or acute infarction. There was no acute or active brain process seen. No skull lesion evident. Chest x-ray yielded a borderline heart size but no acute findings or change. Urinalysis was negative for any sign of infection.     Hospital Course:  Hypertensive emergency: Etiology unclear. Patient denied any change in her medications of late. She was given 10 mg of labetalol IV in the emergency room. Blood pressure at time of my exam 168/93. Admitted to telemetry. No events on tele. BP controlled since admission. Seen by cardiology with no new recommendations as BP controlled. Active Problems:   Status post placement of cardiac pacemaker. 08/29/12 St. Jude device: Chart review indicates patient seen by cardiology in may of this year. Pacemaker interrogated and functioning normally   HTN (hypertension): See 1.   Hyperlipidemia: Lipid panel unremarkable. Continue her home   Cardiac dysrhythmia, unspecified: Patient with history of PAT. Chart review indicates that her visit with cardiologist in may of this year yielded no atrial fibrillation had been detected by her device sense of SVT were brief maximum and 36 seconds and not likely associated with her complaints of fatigue or dyspnea at that time.  Seen by cardiology this hospitalization and pace maker interrogated without problem  Chronic ischemic heart disease, unspecified: 2-D echo obtained. Volume status -1L.  Dizzy/fatigue: Etiology uncertain. Not orthostatic. Lasix held at admission. Gently hydrated although pt did not appear dehydrated on admission. CT of the head as no  acute abnormality. Neuro exam benign. Chart review indicates patient has been experiencing generalized fatigue for several months. TSH within the limits of normal. No indication related to cardiac issue. No indication of neuro issue. Recommend OP follow up with PCP for evaluation of symptoms.  Procedures: none Consultations:  Cardiology   Discharge Exam: Filed Vitals:   04/09/13 0954  BP: 158/78  Pulse: 66  Temp: 99.2 F (37.3 C)  Resp: 18    General: well nourished NAD Cardiovascular: RRR no gallup no rub Respiratory: normal effort BS clear bilaterally no wheeze no rhonchi no crackles Abdomen: soft +BS non-tender to palpation  Discharge Instructions   Future Appointments Provider Department Dept Phone   05/12/2013 6:05 AM [facility] [employer] [place] [phone]       Medication List         acetaminophen 325 MG tablet  Commonly known as:  TYLENOL  Take 650 mg by mouth every 6 (six) hours as needed for pain.     aspirin EC 81 MG tablet  Take 81 mg by mouth daily.     atorvastatin 20 MG tablet  Commonly known as:  LIPITOR  Take 20 mg by mouth daily.     ezetimibe 10 MG tablet  Commonly known as:  ZETIA  Take 10 mg by mouth daily.     ferrous sulfate 325 (65 FE) MG tablet  Take 325 mg by mouth every other day.     furosemide 20 MG tablet  Commonly known as:  LASIX  Take 20 mg by mouth daily as needed. For fluid retention     ibuprofen 200 MG tablet  Commonly known as:  ADVIL,MOTRIN  Take 200 mg by mouth every 6 (six) hours as needed for pain.     l-methylfolate-B6-B12 3-35-2 MG Tabs  Commonly known as:  METANX  Take 1 tablet by mouth daily.     meloxicam 15 MG tablet  Commonly known as:  MOBIC  Take 15 mg by mouth daily as needed. For inflamation     nebivolol 5 MG tablet  Commonly known as:  BYSTOLIC  Take 1 tablet (5 mg total) by mouth daily.     OCUVITE PO  Take 1 tablet by mouth 2 (two) times  daily.     ramipril 2.5 MG tablet  Commonly known as:  ALTACE  Take 2.5 mg by mouth daily.     SYSTANE OP  Apply 1 drop to eye 2 (two) times daily as needed (Dry Eyes).     VITAMIN D-3 PO  Take 1 capsule by mouth daily.       No Known Allergies    The results of significant diagnostics from this hospitalization (including imaging, microbiology, ancillary and laboratory) are listed below for reference.    Significant Diagnostic Studies: Dg Chest 1 View  04/08/2013   CLINICAL DATA:  Weakness, dizziness.  EXAM: CHEST - 1 VIEW  COMPARISON:  08/30/2012  FINDINGS: Left dual lead pacer in place, unchanged. Heart is borderline in size. No confluent airspace opacities or effusions. No acute bony abnormality.  IMPRESSION: Borderline heart size. No acute findings or change.   Electronically Signed   By: Charlett Nose M.D.   On: 04/08/2013 14:36   Ct Head Wo Contrast  04/08/2013   *RADIOLOGY REPORT*  Clinical Data:  Dizziness.  Generalized weakness.  CT HEAD WITHOUT CONTRAST  Technique:  Contiguous axial images were obtained from the base of the skull through the vertex without contrast.  Comparison:  The none  Findings:  There is no evidence of brain mass, brain hemorrhage, or acute infarction.  The ventricular system is normal size and shape.  There is no evidence of shift of midline structures or subdural or epidural hematoma.  There is prominence of the cortical sulci and fissures consistent with atrophy.  Minimal periventricular and white matter hypodensities are seen consistent with white matter degeneration most likely representing chronic microvascular ischemic changes.  Nonaneurysmal calcifications of the right vertebral artery and the left internal carotid artery are present.  The calvarium is intact.  Mastoids are well aerated.  No sinusitis is evident.  IMPRESSION: There is no evidence of brain mass, brain hemorrhage, or acute infarction. Cerebral atrophic changes are seen. Minimal  periventricular and white matter hypodensities are seen consistent with white matter degeneration most likely representing chronic microvascular ischemic changes. Nonaneurysmal calcifications of the right vertebral artery and the left internal carotid artery are present.  No acute or active brain process is seen.  No skull lesion is evident.  No sinusitis is evident.   Original Report Authenticated By: Onalee Hua Call    Microbiology: No results found for this or any previous visit (from the past 240 hour(s)).   Labs: Basic Metabolic Panel:  Recent Labs Lab 04/08/13 1334  NA 144  K 3.6  CL 106  CO2 28  GLUCOSE 100*  BUN 17  CREATININE 0.77  CALCIUM 10.6*   Liver Function Tests: No results found for this basename: AST, ALT, ALKPHOS, BILITOT, PROT, ALBUMIN,  in the last 168 hours No results found for this basename: LIPASE, AMYLASE,  in the last 168 hours No results found for this basename: AMMONIA,  in the last 168 hours CBC:  Recent Labs Lab 04/08/13 1334  WBC 5.0  HGB 13.9  HCT 43.4  MCV 94.1  PLT 159   Cardiac Enzymes:  Recent Labs Lab 04/08/13 1334 04/08/13 1955 04/09/13 0618  TROPONINI <0.30 <0.30 <0.30   BNP: BNP (last 3 results)  Recent Labs  04/08/13 1334  PROBNP 533.7*   CBG:  Recent Labs Lab 04/09/13 1109  GLUCAP 108*       Signed:  Cayne Yom M  [facility] 04/09/2013, 11:40 AM

## 2013-04-19 DIAGNOSIS — I498 Other specified cardiac arrhythmias: Secondary | ICD-10-CM

## 2013-04-19 DIAGNOSIS — I471 Supraventricular tachycardia: Secondary | ICD-10-CM

## 2013-04-19 LAB — PACEMAKER DEVICE OBSERVATION

## 2013-04-26 ENCOUNTER — Other Ambulatory Visit: Payer: Self-pay | Admitting: Cardiovascular Disease

## 2013-04-26 ENCOUNTER — Encounter: Payer: Self-pay | Admitting: *Deleted

## 2013-04-26 LAB — REMOTE PACEMAKER DEVICE
AL AMPLITUDE: 3 mv
BAMS-0001: 150 {beats}/min
BATTERY VOLTAGE: 3.02 V
RV LEAD AMPLITUDE: 4.9 mv
RV LEAD IMPEDENCE PM: 440 Ohm

## 2013-04-30 ENCOUNTER — Encounter: Payer: Self-pay | Admitting: Internal Medicine

## 2013-05-01 ENCOUNTER — Encounter: Payer: Self-pay | Admitting: Cardiovascular Disease

## 2013-07-07 ENCOUNTER — Ambulatory Visit: Payer: Medicare Other | Admitting: Internal Medicine

## 2013-07-21 ENCOUNTER — Encounter: Payer: Medicare Other | Admitting: *Deleted

## 2013-08-13 ENCOUNTER — Encounter: Payer: Self-pay | Admitting: *Deleted

## 2013-08-13 ENCOUNTER — Other Ambulatory Visit: Payer: Self-pay | Admitting: Internal Medicine

## 2013-08-13 ENCOUNTER — Encounter: Payer: Self-pay | Admitting: Internal Medicine

## 2013-08-13 ENCOUNTER — Ambulatory Visit (INDEPENDENT_AMBULATORY_CARE_PROVIDER_SITE_OTHER): Payer: Medicare Other | Admitting: Internal Medicine

## 2013-08-13 VITALS — BP 138/61 | HR 72 | Ht 66.0 in | Wt 158.0 lb

## 2013-08-13 DIAGNOSIS — M6281 Muscle weakness (generalized): Secondary | ICD-10-CM

## 2013-08-13 DIAGNOSIS — I2510 Atherosclerotic heart disease of native coronary artery without angina pectoris: Secondary | ICD-10-CM | POA: Insufficient documentation

## 2013-08-13 DIAGNOSIS — I495 Sick sinus syndrome: Secondary | ICD-10-CM

## 2013-08-13 DIAGNOSIS — Z9889 Other specified postprocedural states: Secondary | ICD-10-CM

## 2013-08-13 DIAGNOSIS — I259 Chronic ischemic heart disease, unspecified: Secondary | ICD-10-CM

## 2013-08-13 DIAGNOSIS — Z8679 Personal history of other diseases of the circulatory system: Secondary | ICD-10-CM

## 2013-08-13 DIAGNOSIS — R531 Weakness: Secondary | ICD-10-CM

## 2013-08-13 DIAGNOSIS — Z959 Presence of cardiac and vascular implant and graft, unspecified: Secondary | ICD-10-CM

## 2013-08-13 DIAGNOSIS — R0602 Shortness of breath: Secondary | ICD-10-CM

## 2013-08-13 DIAGNOSIS — R5381 Other malaise: Secondary | ICD-10-CM

## 2013-08-13 DIAGNOSIS — R5383 Other fatigue: Secondary | ICD-10-CM

## 2013-08-13 LAB — MDC_IDC_ENUM_SESS_TYPE_INCLINIC
Battery Voltage: 3.01 V
Date Time Interrogation Session: 20150114122626
Implantable Pulse Generator Model: 2210
Implantable Pulse Generator Serial Number: 7437302
Lead Channel Pacing Threshold Amplitude: 0.5 V
Lead Channel Pacing Threshold Amplitude: 0.625 V
Lead Channel Pacing Threshold Pulse Width: 0.4 ms
Lead Channel Sensing Intrinsic Amplitude: 3.1 mV
Lead Channel Setting Pacing Amplitude: 0.75 V
MDC IDC MSMT BATTERY REMAINING LONGEVITY: 136.8 mo
MDC IDC MSMT LEADCHNL RA IMPEDANCE VALUE: 425 Ohm
MDC IDC MSMT LEADCHNL RV IMPEDANCE VALUE: 412.5 Ohm
MDC IDC MSMT LEADCHNL RV PACING THRESHOLD PULSEWIDTH: 0.4 ms
MDC IDC MSMT LEADCHNL RV SENSING INTR AMPL: 4.8 mV
MDC IDC SET LEADCHNL RA PACING AMPLITUDE: 1.625
MDC IDC SET LEADCHNL RV PACING PULSEWIDTH: 0.4 ms
MDC IDC SET LEADCHNL RV SENSING SENSITIVITY: 2 mV
MDC IDC STAT BRADY RA PERCENT PACED: 45 %
MDC IDC STAT BRADY RV PERCENT PACED: 0.22 %
Miscellaneous Comment: 1:38 {titer}

## 2013-08-13 LAB — CBC WITH DIFFERENTIAL/PLATELET
Basophils Absolute: 0 10*3/uL (ref 0.0–0.1)
Basophils Relative: 0 % (ref 0–1)
EOS ABS: 0.1 10*3/uL (ref 0.0–0.7)
EOS PCT: 3 % (ref 0–5)
HCT: 37.5 % (ref 36.0–46.0)
HEMOGLOBIN: 12.3 g/dL (ref 12.0–15.0)
LYMPHS ABS: 1.4 10*3/uL (ref 0.7–4.0)
Lymphocytes Relative: 32 % (ref 12–46)
MCH: 29.4 pg (ref 26.0–34.0)
MCHC: 32.8 g/dL (ref 30.0–36.0)
MCV: 89.7 fL (ref 78.0–100.0)
MONO ABS: 0.5 10*3/uL (ref 0.1–1.0)
MONOS PCT: 12 % (ref 3–12)
Neutro Abs: 2.3 10*3/uL (ref 1.7–7.7)
Neutrophils Relative %: 53 % (ref 43–77)
Platelets: 175 10*3/uL (ref 150–400)
RBC: 4.18 MIL/uL (ref 3.87–5.11)
RDW: 13.8 % (ref 11.5–15.5)
WBC: 4.3 10*3/uL (ref 4.0–10.5)

## 2013-08-13 LAB — BASIC METABOLIC PANEL
BUN: 23 mg/dL (ref 6–23)
CHLORIDE: 106 meq/L (ref 96–112)
CO2: 31 meq/L (ref 19–32)
CREATININE: 0.82 mg/dL (ref 0.50–1.10)
Calcium: 9.6 mg/dL (ref 8.4–10.5)
GLUCOSE: 89 mg/dL (ref 70–99)
Potassium: 5.2 mEq/L (ref 3.5–5.3)
Sodium: 144 mEq/L (ref 135–145)

## 2013-08-13 LAB — TSH: TSH: 1.501 u[IU]/mL (ref 0.350–4.500)

## 2013-08-13 MED ORDER — ATORVASTATIN CALCIUM 10 MG PO TABS: 10.0000 mg | ORAL_TABLET | Freq: Every day | ORAL | Status: DC

## 2013-08-13 NOTE — Assessment & Plan Note (Signed)
Her symptoms to me sound more systemic and muscular. We will obtain a TSH, CBC, kidney function, and undergo watchful waiting. She is no longer on statin therapy.

## 2013-08-13 NOTE — Patient Instructions (Addendum)
Your physician recommends that you schedule a follow-up appointment in: 6 months with Dr Court Joyaylor You will receive a reminder letter two months in advance reminding you to call and schedule your appointment. If you don't receive this letter, please contact our office.  Your physician has requested that you have a lexiscan myoview. For further information please visit [email or web address]. Please follow instruction sheet, as given.  Your physician has recommended you make the following change in your medication:  1. Start Lipitor 10 mg daily  Your physician recommends that you return for lab work in: CBC w/Dif, TSH, BMP     Merlin Transmission November 14, 2013

## 2013-08-13 NOTE — Progress Notes (Signed)
HPI [name] is referred today for ongoing evaluation and management of her permanent pacemaker and for evaluation of shortness of breath, weakness, and fatigue in the setting of coronary artery disease. The patient is a very pleasant 80 year old woman whose cardiac history dates back to 2002. She developed chest pain and underwent stenting of the LAD at that time. Previously, she had preserved left ventricular function. Repeat catheterization in 2004 demonstrated no restenosis of her LAD stent. Over the last year, she has noted increasing fatigue and weakness. It was thought that her pacemaker might improve her symptoms. She has not had chest pain per se, but she has had shortness of breath with exertion and notes that she has less energy than usual. She denies syncope or sustained tachycardia palpitations. No peripheral edema. She admits to being sedentary. No syncope. No Known Allergies   Current Outpatient Prescriptions  Medication Sig Dispense Refill  . acetaminophen (TYLENOL) 325 MG tablet Take 650 mg by mouth every 6 (six) hours as needed for pain.      Marland Kitchen. aspirin EC 81 MG tablet Take 81 mg by mouth daily.      Marland Kitchen. atorvastatin (LIPITOR) 20 MG tablet Take 20 mg by mouth daily.       . Cholecalciferol (VITAMIN D-3 PO) Take 1 capsule by mouth daily.      Marland Kitchen. ezetimibe (ZETIA) 10 MG tablet Take 10 mg by mouth daily.      . ferrous sulfate 325 (65 FE) MG tablet Take 325 mg by mouth every other day.       . furosemide (LASIX) 20 MG tablet Take 20 mg by mouth daily as needed. For fluid retention      . ibuprofen (ADVIL,MOTRIN) 200 MG tablet Take 200 mg by mouth every 6 (six) hours as needed for pain.      Marland Kitchen. l-methylfolate-B6-B12 (METANX) 3-35-2 MG TABS Take 1 tablet by mouth daily.      . meloxicam (MOBIC) 15 MG tablet Take 15 mg by mouth daily as needed. For inflamation      . Multiple Vitamins-Minerals (OCUVITE PO) Take 1 tablet by mouth 2 (two) times daily.      . nebivolol (BYSTOLIC)  5 MG tablet Take 1 tablet (5 mg total) by mouth daily.  30 tablet  6  . Polyethyl Glycol-Propyl Glycol (SYSTANE OP) Apply 1 drop to eye 2 (two) times daily as needed (Dry Eyes).       . ramipril (ALTACE) 2.5 MG tablet Take 2.5 mg by mouth daily.       No current facility-administered medications for this visit.     Past Medical History  Diagnosis Date  . Pacemaker 08/29/2012    st jude accent DR RF device, model number [id number], serial number [id number], implanted 08/29/2012 for sinus bradycardia, runs of supraventricular tachycardia  last checked 10/23/2012  . ICD (implantable cardiac defibrillator) in place   . SSS (sick sinus syndrome) with PAT and marked bradycardia with 3 sec pauses  08/30/2012  . Status post placement of cardiac pacemaker. 08/29/12 St. Jude device 08/30/2012  . HTN (hypertension) 08/30/2012  . Hyperlipidemia 08/30/2012  . Polyneuropathy in other diseases classified elsewhere   . Macular degeneration   . Coronary artery disease     stent in the LAD artery in 2002  . Venous insufficiency     ROS:   All systems reviewed and negative except as noted in the HPI.   Past Surgical History  Procedure Laterality Date  .  Cardiac catheterization  2006    3 stents placed  . Insert / replace / remove pacemaker  08/29/2012    st jude   . Rotator cuff repair Left   . Abdominal hysterectomy    . Cesarean section    . Kidney stone surgery    . Rotator cuff repair Right   . Cardiac catheterization  06/2001    placement of BiodivYsio 2.5.10mm stent dilated to 2.50mm in proximal left anterior descenting stenotic lesion  . Radiofrequency ablation  08/25/2010    ablation of left greater saphenous vein     Family History  Problem Relation Age of Onset  . Heart disease Mother   . Congestive Heart Failure Mother   . Heart disease Father   . Heart attack Father   . Heart disease Brother      History   Social History  . Marital Status: Widowed    Spouse Name: N/A     Number of Children: 2  . Years of Education: N/A   Occupational History  .     Social History Main Topics  . Smoking status: Never Smoker   . Smokeless tobacco: Not on file  . Alcohol Use: No  . Drug Use: No  . Sexual Activity: Not on file   Other Topics Concern  . Not on file   Social History Narrative  . No narrative on file     BP 138/61  Pulse 72  Ht 5\' 6"  (1.676 m)  Wt 158 lb (71.668 kg)  BMI 25.51 kg/m2  SpO2 100%  Physical Exam:  Well appearing elderly woman, NAD HEENT: Unremarkable Neck:  No JVD, no thyromegally Back:  No CVA tenderness Lungs:  Clear with no wheezes, rales, or rhonchi. HEART:  Regular rate rhythm, no murmurs, no rubs, no clicks Abd:  soft, positive bowel sounds, no organomegally, no rebound, no guarding Ext:  2 plus pulses, no edema, no cyanosis, no clubbing Skin:  No rashes no nodules Neuro:  CN II through XII intact, motor grossly intact   DEVICE  Normal device function.  See PaceArt for details.   Assess/Plan:

## 2013-08-13 NOTE — Assessment & Plan Note (Signed)
The patient has not had angina, but she has exertional dyspnea and fatigue. I am concerned that this may be her anginal equivalent. If she has known coronary disease and is status post stenting 12 years ago, I recommended that she undergo stress testing to try to better understand her mechanism of her symptoms. Hopefully we will be able to gain an understanding of her left ventricular function is as well.

## 2013-08-13 NOTE — Assessment & Plan Note (Signed)
Her dual-chamber St. Jude pacemaker is working normally. Upon recheck in several months.

## 2013-08-14 ENCOUNTER — Encounter: Payer: Self-pay | Admitting: Internal Medicine

## 2013-08-21 ENCOUNTER — Encounter (HOSPITAL_COMMUNITY)
Admission: RE | Admit: 2013-08-21 | Discharge: 2013-08-21 | Disposition: A | Discharge status: Home / Self Care | Payer: Medicare Other | Source: Ambulatory Visit | Attending: Internal Medicine | Admitting: Internal Medicine

## 2013-08-21 ENCOUNTER — Encounter (HOSPITAL_COMMUNITY): Payer: Self-pay

## 2013-08-21 DIAGNOSIS — Z8679 Personal history of other diseases of the circulatory system: Secondary | ICD-10-CM | POA: Insufficient documentation

## 2013-08-21 DIAGNOSIS — R0602 Shortness of breath: Secondary | ICD-10-CM

## 2013-08-21 DIAGNOSIS — Z959 Presence of cardiac and vascular implant and graft, unspecified: Secondary | ICD-10-CM

## 2013-08-21 DIAGNOSIS — Z9889 Other specified postprocedural states: Secondary | ICD-10-CM | POA: Insufficient documentation

## 2013-08-21 MED ORDER — TECHNETIUM TC 99M SESTAMIBI GENERIC - CARDIOLITE
10.0000 | Freq: Once | INTRAVENOUS | Status: AC | PRN
  Administered 2013-08-21: 10 via INTRAVENOUS

## 2013-08-21 MED ORDER — TECHNETIUM TC 99M SESTAMIBI - CARDIOLITE
30.0000 | Freq: Once | INTRAVENOUS | Status: AC | PRN
  Administered 2013-08-21: 12:00:00 30 via INTRAVENOUS

## 2013-08-21 MED ORDER — SODIUM CHLORIDE 0.9 % IJ SOLN
INTRAMUSCULAR | Status: AC
  Administered 2013-08-21: 10 mL via INTRAVENOUS
  Filled 2013-08-21: qty 10

## 2013-08-21 MED ORDER — REGADENOSON 0.4 MG/5ML IV SOLN
INTRAVENOUS | Status: AC
  Administered 2013-08-21: 0.4 mg via INTRAVENOUS
  Filled 2013-08-21: qty 5

## 2013-08-21 NOTE — Progress Notes (Signed)
Stress Lab Nurses Notes - [facility]  Rosanne Sackreva N Meddings 08/21/2013 Reason for doing test: Dyspnea and weakness Type of test: Marlane HatcherLexiscan Cardiolite Nurse performing test: Parke PoissonPhyllis Billingsly, RN Nuclear Medicine Tech: Marcella DubsMiranda Womack Echo Tech: Not Applicable MD performing test: S. McDowell/K.Lawrence NP Family MD: Dr. Regino SchultzeMcGough Test explained and consent signed: yes IV started: 22g jelco, Saline lock flushed, No redness or edema and Saline lock started in radiology Symptoms: None Treatment/Intervention: None Reason test stopped: protocol completed After recovery IV was: Discontinued via X-ray tech and No redness or edema Patient to return to Nuc. Med at :12:15 Patient discharged: Home Patient's Condition upon discharge was: stable Comments: During test BP 174/82 & HR 81.  Recovery BP 151/82 & HR 66.  Symptoms resolved in recovery. Erskine SpeedBillingsley, Jaylyne Breese T

## 2013-08-29 ENCOUNTER — Other Ambulatory Visit: Payer: Self-pay | Admitting: Cardiovascular Disease

## 2013-08-29 ENCOUNTER — Telehealth: Payer: Self-pay | Admitting: *Deleted

## 2013-08-29 NOTE — Telephone Encounter (Signed)
Pt is unsure if she needs to make f/u appt to get her stress test results.

## 2013-08-29 NOTE — Telephone Encounter (Signed)
Spoke to pt , made pt aware of normal Stress test. And to keep appt for Dr Ladona Ridgelaylor in 5 monthss

## 2013-08-29 NOTE — Telephone Encounter (Signed)
Left a message for pt to call back

## 2013-09-01 NOTE — Telephone Encounter (Signed)
Rx was sent to pharmacy electronically. 

## 2013-10-28 ENCOUNTER — Other Ambulatory Visit: Payer: Self-pay

## 2013-10-28 MED ORDER — L-METHYLFOLATE-B6-B12 3-35-2 MG PO TABS
1.0000 | ORAL_TABLET | Freq: Two times a day (BID) | ORAL | Status: DC
Start: 2013-10-28 — End: 2014-03-09

## 2013-10-28 MED ORDER — L-METHYLFOLATE-B6-B12 3-35-2 MG PO TABS: 1.0000 | ORAL_TABLET | Freq: Every day | ORAL | Status: DC

## 2013-11-14 ENCOUNTER — Encounter: Payer: Medicare Other | Admitting: *Deleted

## 2013-11-26 ENCOUNTER — Encounter: Payer: Self-pay | Admitting: *Deleted

## 2013-12-01 ENCOUNTER — Encounter: Payer: Self-pay | Admitting: Internal Medicine

## 2013-12-01 ENCOUNTER — Ambulatory Visit (INDEPENDENT_AMBULATORY_CARE_PROVIDER_SITE_OTHER): Payer: Medicare Other | Admitting: *Deleted

## 2013-12-01 DIAGNOSIS — I495 Sick sinus syndrome: Secondary | ICD-10-CM

## 2013-12-02 LAB — MDC_IDC_ENUM_SESS_TYPE_REMOTE
Battery Remaining Longevity: 137 mo
Battery Voltage: 3.01 V
Brady Statistic AP VS Percent: 40 %
Brady Statistic RA Percent Paced: 40 %
Brady Statistic RV Percent Paced: 1 %
Date Time Interrogation Session: 20150504203534
Lead Channel Impedance Value: 390 Ohm
Lead Channel Pacing Threshold Pulse Width: 0.4 ms
Lead Channel Pacing Threshold Pulse Width: 0.4 ms
Lead Channel Sensing Intrinsic Amplitude: 2.6 mV
Lead Channel Setting Pacing Amplitude: 0.75 V
Lead Channel Setting Pacing Pulse Width: 0.4 ms
MDC IDC MSMT LEADCHNL RA PACING THRESHOLD AMPLITUDE: 0.75 V
MDC IDC MSMT LEADCHNL RV IMPEDANCE VALUE: 440 Ohm
MDC IDC MSMT LEADCHNL RV PACING THRESHOLD AMPLITUDE: 0.5 V
MDC IDC MSMT LEADCHNL RV SENSING INTR AMPL: 4.8 mV
MDC IDC PG SERIAL: 7437302
MDC IDC SET LEADCHNL RA PACING AMPLITUDE: 1.75 V
MDC IDC SET LEADCHNL RV SENSING SENSITIVITY: 2 mV
MDC IDC STAT BRADY AP VP PERCENT: 1 %
MDC IDC STAT BRADY AS VP PERCENT: 1 %
MDC IDC STAT BRADY AS VS PERCENT: 60 %

## 2013-12-10 ENCOUNTER — Other Ambulatory Visit: Payer: Self-pay | Admitting: Cardiovascular Disease

## 2013-12-10 NOTE — Telephone Encounter (Signed)
Rx was sent to pharmacy electronically. 

## 2013-12-11 ENCOUNTER — Encounter: Payer: Self-pay | Admitting: Cardiology

## 2014-01-03 ENCOUNTER — Emergency Department (HOSPITAL_COMMUNITY): Payer: Medicare Other

## 2014-01-03 ENCOUNTER — Encounter (HOSPITAL_COMMUNITY): Payer: Self-pay | Admitting: Emergency Medicine

## 2014-01-03 ENCOUNTER — Inpatient Hospital Stay (HOSPITAL_COMMUNITY)
Admission: EM | Admit: 2014-01-03 | Discharge: 2014-01-06 | DRG: 470 | Disposition: A | Discharge status: Skilled Nursing Facility | Payer: Medicare Other | Attending: Internal Medicine | Admitting: Internal Medicine

## 2014-01-03 DIAGNOSIS — I259 Chronic ischemic heart disease, unspecified: Secondary | ICD-10-CM

## 2014-01-03 DIAGNOSIS — I499 Cardiac arrhythmia, unspecified: Secondary | ICD-10-CM | POA: Diagnosis present

## 2014-01-03 DIAGNOSIS — Y92009 Unspecified place in unspecified non-institutional (private) residence as the place of occurrence of the external cause: Secondary | ICD-10-CM

## 2014-01-03 DIAGNOSIS — R9431 Abnormal electrocardiogram [ECG] [EKG]: Secondary | ICD-10-CM

## 2014-01-03 DIAGNOSIS — I10 Essential (primary) hypertension: Secondary | ICD-10-CM

## 2014-01-03 DIAGNOSIS — Z950 Presence of cardiac pacemaker: Secondary | ICD-10-CM | POA: Diagnosis present

## 2014-01-03 DIAGNOSIS — D509 Iron deficiency anemia, unspecified: Secondary | ICD-10-CM

## 2014-01-03 DIAGNOSIS — S72009A Fracture of unspecified part of neck of unspecified femur, initial encounter for closed fracture: Secondary | ICD-10-CM

## 2014-01-03 DIAGNOSIS — S72033A Displaced midcervical fracture of unspecified femur, initial encounter for closed fracture: Principal | ICD-10-CM | POA: Diagnosis present

## 2014-01-03 DIAGNOSIS — W010XXA Fall on same level from slipping, tripping and stumbling without subsequent striking against object, initial encounter: Secondary | ICD-10-CM

## 2014-01-03 DIAGNOSIS — D696 Thrombocytopenia, unspecified: Secondary | ICD-10-CM

## 2014-01-03 DIAGNOSIS — G609 Hereditary and idiopathic neuropathy, unspecified: Secondary | ICD-10-CM | POA: Diagnosis present

## 2014-01-03 DIAGNOSIS — Z79899 Other long term (current) drug therapy: Secondary | ICD-10-CM

## 2014-01-03 DIAGNOSIS — H3530 Unspecified macular degeneration: Secondary | ICD-10-CM | POA: Diagnosis present

## 2014-01-03 DIAGNOSIS — S72002A Fracture of unspecified part of neck of left femur, initial encounter for closed fracture: Secondary | ICD-10-CM

## 2014-01-03 DIAGNOSIS — E785 Hyperlipidemia, unspecified: Secondary | ICD-10-CM | POA: Diagnosis present

## 2014-01-03 DIAGNOSIS — I2510 Atherosclerotic heart disease of native coronary artery without angina pectoris: Secondary | ICD-10-CM

## 2014-01-03 DIAGNOSIS — M810 Age-related osteoporosis without current pathological fracture: Secondary | ICD-10-CM | POA: Diagnosis present

## 2014-01-03 DIAGNOSIS — W19XXXA Unspecified fall, initial encounter: Secondary | ICD-10-CM | POA: Diagnosis present

## 2014-01-03 DIAGNOSIS — Z7982 Long term (current) use of aspirin: Secondary | ICD-10-CM

## 2014-01-03 LAB — COMPREHENSIVE METABOLIC PANEL
ALBUMIN: 4 g/dL (ref 3.5–5.2)
ALK PHOS: 49 U/L (ref 39–117)
ALT: 17 U/L (ref 0–35)
AST: 25 U/L (ref 0–37)
BUN: 22 mg/dL (ref 6–23)
CO2: 25 mEq/L (ref 19–32)
Calcium: 9.8 mg/dL (ref 8.4–10.5)
Chloride: 106 mEq/L (ref 96–112)
Creatinine, Ser: 0.76 mg/dL (ref 0.50–1.10)
GFR calc Af Amer: 83 mL/min — ABNORMAL LOW (ref >90)
GFR calc non Af Amer: 72 mL/min — ABNORMAL LOW (ref >90)
Glucose, Bld: 110 mg/dL — ABNORMAL HIGH (ref 70–99)
POTASSIUM: 3.9 meq/L (ref 3.7–5.3)
Sodium: 144 mEq/L (ref 137–147)
Total Bilirubin: 0.4 mg/dL (ref 0.3–1.2)
Total Protein: 6.4 g/dL (ref 6.0–8.3)

## 2014-01-03 LAB — URINALYSIS, ROUTINE W REFLEX MICROSCOPIC
Bilirubin Urine: NEGATIVE
Glucose, UA: NEGATIVE mg/dL
HGB URINE DIPSTICK: NEGATIVE
KETONES UR: 15 mg/dL — AB
Leukocytes, UA: NEGATIVE
Nitrite: NEGATIVE
PH: 8 (ref 5.0–8.0)
PROTEIN: NEGATIVE mg/dL
Specific Gravity, Urine: 1.013 (ref 1.005–1.030)
Urobilinogen, UA: 0.2 mg/dL (ref 0.0–1.0)

## 2014-01-03 LAB — CBC
HCT: 36.1 % (ref 36.0–46.0)
Hemoglobin: 11.3 g/dL — ABNORMAL LOW (ref 12.0–15.0)
MCH: 29 pg (ref 26.0–34.0)
MCHC: 31.3 g/dL (ref 30.0–36.0)
MCV: 92.8 fL (ref 78.0–100.0)
PLATELETS: 149 10*3/uL — AB (ref 150–400)
RBC: 3.89 MIL/uL (ref 3.87–5.11)
RDW: 13.9 % (ref 11.5–15.5)
WBC: 7.7 10*3/uL (ref 4.0–10.5)

## 2014-01-03 LAB — URINE MICROSCOPIC-ADD ON

## 2014-01-03 MED ORDER — EZETIMIBE 10 MG PO TABS
10.0000 mg | ORAL_TABLET | Freq: Every day | ORAL | Status: DC
  Administered 2014-01-05 – 2014-01-06 (×2): 10 mg via ORAL
  Filled 2014-01-03 (×3): qty 1

## 2014-01-03 MED ORDER — ONDANSETRON HCL 4 MG/2ML IJ SOLN
4.0000 mg | Freq: Four times a day (QID) | INTRAMUSCULAR | Status: DC | PRN
  Administered 2014-01-03: 4 mg via INTRAVENOUS

## 2014-01-03 MED ORDER — NEBIVOLOL HCL 5 MG PO TABS
5.0000 mg | ORAL_TABLET | Freq: Every day | ORAL | Status: DC
  Administered 2014-01-05 – 2014-01-06 (×2): 5 mg via ORAL
  Filled 2014-01-03 (×3): qty 1

## 2014-01-03 MED ORDER — OCUVITE PO TABS
1.0000 | ORAL_TABLET | Freq: Every day | ORAL | Status: DC
  Administered 2014-01-05 – 2014-01-06 (×2): 1 via ORAL
  Filled 2014-01-03 (×5): qty 1

## 2014-01-03 MED ORDER — ONDANSETRON HCL 4 MG/2ML IJ SOLN
4.0000 mg | Freq: Once | INTRAMUSCULAR | Status: AC
  Administered 2014-01-03: 4 mg via INTRAVENOUS
  Filled 2014-01-03: qty 2

## 2014-01-03 MED ORDER — POLYVINYL ALCOHOL 1.4 % OP SOLN
1.0000 [drp] | OPHTHALMIC | Status: DC | PRN
  Filled 2014-01-03: qty 15

## 2014-01-03 MED ORDER — VITAMIN D-3 125 MCG (5000 UT) PO TABS: 1.0000 | ORAL_TABLET | Freq: Every day | ORAL | Status: DC

## 2014-01-03 MED ORDER — FERROUS SULFATE 325 (65 FE) MG PO TABS
325.0000 mg | ORAL_TABLET | ORAL | Status: DC
  Administered 2014-01-05: 325 mg via ORAL
  Filled 2014-01-03: qty 1

## 2014-01-03 MED ORDER — POLYETHYL GLYCOL-PROPYL GLYCOL 0.4-0.3 % OP SOLN: 1.0000 [drp] | OPHTHALMIC | Status: DC | PRN

## 2014-01-03 MED ORDER — ONDANSETRON HCL 4 MG/2ML IJ SOLN
4.0000 mg | Freq: Three times a day (TID) | INTRAMUSCULAR | Status: AC | PRN
  Administered 2014-01-03: 4 mg via INTRAVENOUS
  Filled 2014-01-03 (×2): qty 2

## 2014-01-03 MED ORDER — L-METHYLFOLATE-B6-B12 3-35-2 MG PO TABS
1.0000 | ORAL_TABLET | Freq: Two times a day (BID) | ORAL | Status: DC
  Filled 2014-01-03: qty 1

## 2014-01-03 MED ORDER — ONDANSETRON HCL 4 MG PO TABS: 4.0000 mg | ORAL_TABLET | Freq: Four times a day (QID) | ORAL | Status: DC | PRN

## 2014-01-03 MED ORDER — MORPHINE SULFATE 2 MG/ML IJ SOLN
2.0000 mg | INTRAMUSCULAR | Status: DC | PRN
  Administered 2014-01-03 – 2014-01-04 (×4): 2 mg via INTRAVENOUS
  Filled 2014-01-03 (×4): qty 1

## 2014-01-03 MED ORDER — HYDROCODONE-ACETAMINOPHEN 5-325 MG PO TABS
1.0000 | ORAL_TABLET | ORAL | Status: DC | PRN
  Administered 2014-01-03: 2 via ORAL
  Filled 2014-01-03: qty 2

## 2014-01-03 MED ORDER — HYDROMORPHONE HCL PF 1 MG/ML IJ SOLN
0.5000 mg | INTRAMUSCULAR | Status: DC | PRN
  Administered 2014-01-03 – 2014-01-04 (×2): 0.5 mg via INTRAVENOUS
  Filled 2014-01-03 (×2): qty 1

## 2014-01-03 MED ORDER — ATORVASTATIN CALCIUM 10 MG PO TABS
10.0000 mg | ORAL_TABLET | Freq: Every day | ORAL | Status: DC
  Administered 2014-01-04 – 2014-01-05 (×2): 10 mg via ORAL
  Filled 2014-01-03 (×4): qty 1

## 2014-01-03 MED ORDER — B COMPLEX-C PO TABS
1.0000 | ORAL_TABLET | Freq: Two times a day (BID) | ORAL | Status: DC
  Administered 2014-01-03 – 2014-01-06 (×5): 1 via ORAL
  Filled 2014-01-03 (×7): qty 1

## 2014-01-03 MED ORDER — ACETAMINOPHEN 650 MG RE SUPP: 650.0000 mg | Freq: Four times a day (QID) | RECTAL | Status: DC | PRN

## 2014-01-03 MED ORDER — RAMIPRIL 2.5 MG PO CAPS
2.5000 mg | ORAL_CAPSULE | Freq: Every day | ORAL | Status: DC
  Administered 2014-01-05 – 2014-01-06 (×2): 2.5 mg via ORAL
  Filled 2014-01-03 (×3): qty 1

## 2014-01-03 MED ORDER — SODIUM CHLORIDE 0.9 % IV SOLN: INTRAVENOUS | Status: DC

## 2014-01-03 MED ORDER — VITAMIN D3 25 MCG (1000 UNIT) PO TABS
5000.0000 [IU] | ORAL_TABLET | Freq: Every day | ORAL | Status: DC
  Administered 2014-01-05 – 2014-01-06 (×2): 5000 [IU] via ORAL
  Filled 2014-01-03 (×3): qty 5

## 2014-01-03 MED ORDER — ACETAMINOPHEN 325 MG PO TABS: 650.0000 mg | ORAL_TABLET | Freq: Four times a day (QID) | ORAL | Status: DC | PRN

## 2014-01-03 MED ORDER — DEXTROSE 5 % IV SOLN
500.0000 mg | Freq: Four times a day (QID) | INTRAVENOUS | Status: DC | PRN
  Filled 2014-01-03: qty 5

## 2014-01-03 MED ORDER — ASPIRIN EC 81 MG PO TBEC
81.0000 mg | DELAYED_RELEASE_TABLET | Freq: Every day | ORAL | Status: DC
  Filled 2014-01-03: qty 1

## 2014-01-03 MED ORDER — FUROSEMIDE 20 MG PO TABS
20.0000 mg | ORAL_TABLET | Freq: Every day | ORAL | Status: DC | PRN
  Filled 2014-01-03: qty 1

## 2014-01-03 MED ORDER — SODIUM CHLORIDE 0.9 % IJ SOLN
3.0000 mL | Freq: Two times a day (BID) | INTRAMUSCULAR | Status: DC
  Administered 2014-01-03 – 2014-01-06 (×3): 3 mL via INTRAVENOUS

## 2014-01-03 MED ORDER — MORPHINE SULFATE 2 MG/ML IJ SOLN
2.0000 mg | Freq: Once | INTRAMUSCULAR | Status: AC
  Administered 2014-01-03: 2 mg via INTRAVENOUS
  Filled 2014-01-03: qty 1

## 2014-01-03 MED ORDER — ENOXAPARIN SODIUM 40 MG/0.4ML ~~LOC~~ SOLN
40.0000 mg | SUBCUTANEOUS | Status: DC
  Filled 2014-01-03 (×2): qty 0.4

## 2014-01-03 MED ORDER — IBUPROFEN 200 MG PO TABS
200.0000 mg | ORAL_TABLET | Freq: Four times a day (QID) | ORAL | Status: DC | PRN
  Filled 2014-01-03: qty 1

## 2014-01-03 NOTE — H&P (Signed)
[facility] History and Physical  VELETA GUCK ASN:[id number] DOB: 10-Sep-1920 DOA: 01/03/2014  Referring physician: Dr. Cathren Laine PCP: Kirk Ruths, MD   Chief Complaint:  Fall at home  HPI:  80 year old from a functional female with history of hypertension, status post pacemaker for sinus bradycardia, history of ICD for sick sinus syndrome, hypertension, hyperlipidemia, CAD with ischemic heart disease presented with a mechanical fall at home and landed on her left hip.  Patient reports working in her garden clearing some shrubs and when she got up to walk felt dizzy . She then slipped as she took a few steps and landed on the grass. She denies any syncopal episode. She denies any chest pain, palpitations, shortness of breath, headache, blurred vision, nausea, vomiting, abdominal pain, bowel or urinary symptoms. Denies any change in her medications.  Course in the ED Patient vitals were stable. Blood work showed hemoglobin of 11.3 and platelets of 149. Chemistry was unremarkable. Chest x-ray was unremarkable. X-ray of the left hip showed acute left femoral neck fracture. Orthopedic consult Dr. Victorino Dike was called by ED physician who recommended admission to medicine with plan on surgery in the a.m.   Review of Systems:  Constitutional: Denies fever, chills, diaphoresis, appetite change and fatigue.  HEENT: Denies photophobia, eye pain, , hearing loss, ear pain, congestion, sore throat,   trouble swallowing, neck pain,  Respiratory: Denies SOB, DOE, cough, chest tightness,  and wheezing.   Cardiovascular: Denies chest pain, palpitations and leg swelling.  Gastrointestinal: Denies nausea, vomiting, abdominal pain, diarrhea, constipation, blood in stool and abdominal distention.  Genitourinary: Denies dysuria, urgency, frequency, hematuria, flank pain and difficulty urinating.  Endocrine: Denies: hot or cold intolerance, polyuria, polydipsia. Musculoskeletal:  Left hip  pain,Denies myalgias, back pain, joint swelling, arthralgias and gait problem.  Neurological: dizziness, denies seizures, syncope, weakness, light-headedness, numbness and headaches.     Past Medical History  Diagnosis Date  . Pacemaker 08/29/2012    st jude accent DR RF device, model number [id number], serial number [id number], implanted 08/29/2012 for sinus bradycardia, runs of supraventricular tachycardia  last checked 10/23/2012  . ICD (implantable cardiac defibrillator) in place   . SSS (sick sinus syndrome) with PAT and marked bradycardia with 3 sec pauses  08/30/2012  . Status post placement of cardiac pacemaker. 08/29/12 St. Jude device 08/30/2012  . HTN (hypertension) 08/30/2012  . Hyperlipidemia 08/30/2012  . Polyneuropathy in other diseases classified elsewhere   . Macular degeneration   . Coronary artery disease     stent in the LAD artery in 2002  . Venous insufficiency    Past Surgical History  Procedure Laterality Date  . Cardiac catheterization  2006    3 stents placed  . Insert / replace / remove pacemaker  08/29/2012    st jude   . Rotator cuff repair Left   . Abdominal hysterectomy    . Cesarean section    . Kidney stone surgery    . Rotator cuff repair Right   . Cardiac catheterization  06/2001    placement of BiodivYsio 2.5.10mm stent dilated to 2.73mm in proximal left anterior descenting stenotic lesion  . Radiofrequency ablation  08/25/2010    ablation of left greater saphenous vein   Social History:  reports that she has never smoked. She does not have any smokeless tobacco history on file. She reports that she does not drink alcohol or use illicit drugs.  No Known Allergies  Family History  Problem Relation Age of Onset  .  Heart disease Mother   . Congestive Heart Failure Mother   . Heart disease Father   . Heart attack Father   . Heart disease Brother     Prior to Admission medications   Medication Sig Start Date End Date Taking? Authorizing Provider   acetaminophen (TYLENOL) 325 MG tablet Take 650 mg by mouth every 6 (six) hours as needed for pain.   Yes Historical Provider, MD  aspirin EC 81 MG tablet Take 81 mg by mouth daily.   Yes Historical Provider, MD  atorvastatin (LIPITOR) 10 MG tablet Take 1 tablet (10 mg total) by mouth daily. 08/13/13  Yes Marinus Maw, MD  Cholecalciferol (VITAMIN D-3 PO) Take 1 capsule by mouth daily.   Yes Historical Provider, MD  ezetimibe (ZETIA) 10 MG tablet Take 10 mg by mouth daily.   Yes Historical Provider, MD  ferrous sulfate 325 (65 FE) MG tablet Take 325 mg by mouth every other day.    Yes Historical Provider, MD  furosemide (LASIX) 20 MG tablet Take 20 mg by mouth daily as needed for edema.   Yes Historical Provider, MD  ibuprofen (ADVIL,MOTRIN) 200 MG tablet Take 200 mg by mouth every 6 (six) hours as needed for pain.   Yes Historical Provider, MD  l-methylfolate-B6-B12 (METANX) 3-35-2 MG TABS Take 1 tablet by mouth 2 (two) times daily. 10/28/13  Yes York Spaniel, MD  meloxicam (MOBIC) 15 MG tablet Take 15 mg by mouth daily as needed. For inflamation   Yes Historical Provider, MD  Multiple Vitamins-Minerals (OCUVITE PO) Take 1 tablet by mouth 2 (two) times daily.   Yes Historical Provider, MD  nebivolol (BYSTOLIC) 5 MG tablet Take 5 mg by mouth daily.   Yes Historical Provider, MD  Polyethyl Glycol-Propyl Glycol (SYSTANE OP) Apply 1 drop to eye 2 (two) times daily as needed (Dry Eyes).    Yes Historical Provider, MD  ramipril (ALTACE) 2.5 MG capsule Take 2.5 mg by mouth daily.   Yes Historical Provider, MD    Physical Exam:  Filed Vitals:   01/03/14 1530 01/03/14 1545 01/03/14 1600 01/03/14 1630  BP: 160/60  158/67 167/61  Pulse: 84 82 77 78  Temp:      TempSrc:      Resp: 14 19  21   Height:      Weight:      SpO2: 96% 91% 98% 95%    Constitutional: Vital signs reviewed.  Elderly female lying in bed in no acute distress HEENT: No pallor, moist oral mucosa Cardiovascular: RRR, S1  normal, S2 normal, no MRG,  Pulmonary/Chest: CTAB, no wheezes, rales, or rhonchi Abdominal: Soft. Non-tender, non-distended, bowel sounds are normal,  Musculoskeletal: Left hip externally rotated with limited range of motion due to pain CNS: AAO x3  Labs on Admission:  Basic Metabolic Panel:  Recent Labs Lab 01/03/14 1500  NA 144  K 3.9  CL 106  CO2 25  GLUCOSE 110*  BUN 22  CREATININE 0.76  CALCIUM 9.8   Liver Function Tests:  Recent Labs Lab 01/03/14 1500  AST 25  ALT 17  ALKPHOS 49  BILITOT 0.4  PROT 6.4  ALBUMIN 4.0   No results found for this basename: LIPASE, AMYLASE,  in the last 168 hours No results found for this basename: AMMONIA,  in the last 168 hours CBC:  Recent Labs Lab 01/03/14 1500  WBC 7.7  HGB 11.3*  HCT 36.1  MCV 92.8  PLT 149*   Cardiac Enzymes: No results found  for this basename: CKTOTAL, CKMB, CKMBINDEX, TROPONINI,  in the last 168 hours BNP: No components found with this basename: POCBNP,  CBG: No results found for this basename: GLUCAP,  in the last 168 hours  Radiological Exams on Admission: Dg Chest 1 View  01/03/2014   CLINICAL DATA:  Left hip fracture.  Pre-op respiratory exam  EXAM: CHEST - 1 VIEW  COMPARISON:  04/08/2013  FINDINGS: Mild cardiomegaly stable. Dual lead transvenous pacemaker remains in appropriate position. Mild scarring again seen in the left lung base. No evidence of pulmonary infiltrate or edema. No evidence of pleural effusion. No mass or lymphadenopathy identified.  IMPRESSION: Stable mild cardiomegaly and left basilar scarring. No active disease.   Electronically Signed   By: Myles RosenthalJohn  Stahl M.D.   On: 01/03/2014 15:00   Dg Hip Complete Left  01/03/2014   CLINICAL DATA:  Fall, left hip pain and deformity  EXAM: LEFT HIP - COMPLETE 2+ VIEW  COMPARISON:  None.  FINDINGS: Acute left femoral neck fracture with significant foreshortening of the femoral neck. The anatomy appears most consistent with a transcervical  fracture pattern. The femoral head remains located. The patient is rotated toward the left. The visualized bony pelvis remains intact. The bones are osteopenic. Degenerative changes noted throughout the lumbar spine. Unremarkable bowel gas pattern.  IMPRESSION: Acute left femoral neck fracture. Given the leftward rotation, this appears to represent a transcervical femoral neck fracture. A high intertrochanteric pattern is also possible. There is associated significant foreshortening of the femoral neck.   Electronically Signed   By: Malachy MoanHeath  McCullough M.D.   On: 01/03/2014 15:00    EKG: Sinus rhythm, prolonged QTC of 502, moist ET changes  Assessment/Plan   Principal Problem:   Hip fracture, left Secondary to mechanical fall. Admit to telemetry. Pain control with when necessary Tylenol and oral dose of Dilaudid. IV Robaxin when necessary for muscle spasms N.p.o. after midnight for surgery in a.m as per orthopedics consult.. -Foley catheter placed in the ED. -PT eval post surgery. Will need skilled nursing facility and family [facility] [place]. Consult social work -Patient on bystolic which will be continued for perioperative beta blockade. -add bowel regimen   Active Problems:   Status post placement of cardiac pacemaker. 08/29/12 St. Jude device Stable. Monitor on telemetry    HTN (hypertension) Pressure stable. Resume home medications    Osteoporosis, unspecified Dear Tylenol and when necessary Motrin    Cardiac dysrhythmia,  Stable. Monitor on telemetry. Has pacemaker    Chronic ischemic heart disease, unspecified Continue aspirin, Zetia, Lasix, ramipril and bystolic   mild  QT prolongation Monitor on telemetry   DVT prophylaxis: Subcutaneous Lovenox Diet: Cardiac. N.p.o. after midnight  Code Status: Full code Family Communication: Sons at bedside Disposition Plan: Nursing facility after surgery  Harveer Sadler [facility] Pager [phone]  Total time spent: 30 minutes  If 7PM-7AM, please contact night-coverage [email or web address] Password [email or web address] 01/03/2014, 4:44 PM

## 2014-01-03 NOTE — ED Provider Notes (Signed)
CSN: [id number]     Arrival date & time 01/03/14  1255 History   First MD Initiated Contact with Patient 01/03/14 1307     Chief Complaint  Patient presents with  . Hip Pain  . Fall     (Consider location/radiation/quality/duration/timing/severity/associated sxs/prior Treatment) Patient is a 80 y.o. female presenting with hip pain and fall. The history is provided by the patient.  Hip Pain Pertinent negatives include no chest pain, no abdominal pain, no headaches and no shortness of breath.  Fall Pertinent negatives include no chest pain, no abdominal pain, no headaches and no shortness of breath.  pt c/o stumble and fall just pta today at home.  Was in yard, trimming bushes, when bent over, lost balance and fell back landing on left hip. C/o left hip pain since fall. Constant, dull, moderate, non radiating. No numbness/weakness. Denies any faintness or dizziness prior to fall. No chest pain. No palpitations. No sob. No headache. Did not hit head. No loc. No headache. No neck or back pain. No prior hip fx. No anticoag use. Denies any other injury. Skin intact.     Past Medical History  Diagnosis Date  . Pacemaker 08/29/2012    st jude accent DR RF device, model number [id number], serial number [id number], implanted 08/29/2012 for sinus bradycardia, runs of supraventricular tachycardia  last checked 10/23/2012  . ICD (implantable cardiac defibrillator) in place   . SSS (sick sinus syndrome) with PAT and marked bradycardia with 3 sec pauses  08/30/2012  . Status post placement of cardiac pacemaker. 08/29/12 St. Jude device 08/30/2012  . HTN (hypertension) 08/30/2012  . Hyperlipidemia 08/30/2012  . Polyneuropathy in other diseases classified elsewhere   . Macular degeneration   . Coronary artery disease     stent in the LAD artery in 2002  . Venous insufficiency    Past Surgical History  Procedure Laterality Date  . Cardiac catheterization  2006    3 stents placed  . Insert / replace / remove  pacemaker  08/29/2012    st jude   . Rotator cuff repair Left   . Abdominal hysterectomy    . Cesarean section    . Kidney stone surgery    . Rotator cuff repair Right   . Cardiac catheterization  06/2001    placement of BiodivYsio 2.5.10mm stent dilated to 2.43mm in proximal left anterior descenting stenotic lesion  . Radiofrequency ablation  08/25/2010    ablation of left greater saphenous vein   Family History  Problem Relation Age of Onset  . Heart disease Mother   . Congestive Heart Failure Mother   . Heart disease Father   . Heart attack Father   . Heart disease Brother    History  Substance Use Topics  . Smoking status: Never Smoker   . Smokeless tobacco: Not on file  . Alcohol Use: No   OB History   Grav Para Term Preterm Abortions TAB SAB Ect Mult Living                 Review of Systems  Constitutional: Negative for fever and chills.  HENT: Negative for sore throat.   Eyes: Negative for redness.  Respiratory: Negative for shortness of breath.   Cardiovascular: Negative for chest pain.  Gastrointestinal: Negative for nausea, vomiting and abdominal pain.  Genitourinary: Negative for flank pain.  Musculoskeletal: Negative for back pain and neck pain.  Skin: Negative for rash.  Neurological: Negative for weakness, numbness and headaches.  Hematological: Does  not bruise/bleed easily.  Psychiatric/Behavioral: Negative for confusion.      Allergies  Review of patient's allergies indicates no known allergies.  Home Medications   Prior to Admission medications   Medication Sig Start Date End Date Taking? Authorizing Provider  acetaminophen (TYLENOL) 325 MG tablet Take 650 mg by mouth every 6 (six) hours as needed for pain.    Historical Provider, MD  aspirin EC 81 MG tablet Take 81 mg by mouth daily.    Historical Provider, MD  atorvastatin (LIPITOR) 10 MG tablet Take 1 tablet (10 mg total) by mouth daily. 08/13/13   Marinus Maw, MD  BYSTOLIC 5 MG tablet  TAKE ONE (1) TABLET BY MOUTH EVERY DAY 12/10/13   Mihai Croitoru, MD  Cholecalciferol (VITAMIN D-3 PO) Take 1 capsule by mouth daily.    Historical Provider, MD  ezetimibe (ZETIA) 10 MG tablet Take 10 mg by mouth daily.    Historical Provider, MD  ferrous sulfate 325 (65 FE) MG tablet Take 325 mg by mouth every other day.     Historical Provider, MD  furosemide (LASIX) 20 MG tablet TAKE ONE (1) TABLET EACH DAY AS NEEDED FOR EDEMA 08/29/13   Mihai Croitoru, MD  ibuprofen (ADVIL,MOTRIN) 200 MG tablet Take 200 mg by mouth every 6 (six) hours as needed for pain.    Historical Provider, MD  l-methylfolate-B6-B12 (METANX) 3-35-2 MG TABS Take 1 tablet by mouth 2 (two) times daily. 10/28/13   York Spaniel, MD  meloxicam (MOBIC) 15 MG tablet Take 15 mg by mouth daily as needed. For inflamation    Historical Provider, MD  Multiple Vitamins-Minerals (OCUVITE PO) Take 1 tablet by mouth 2 (two) times daily.    Historical Provider, MD  Polyethyl Glycol-Propyl Glycol (SYSTANE OP) Apply 1 drop to eye 2 (two) times daily as needed (Dry Eyes).     Historical Provider, MD  ramipril (ALTACE) 2.5 MG capsule TAKE ONE (1) CAPSULE EACH DAY 08/29/13   Mihai Croitoru, MD   BP 188/62  Pulse 64  Temp(Src) 98.3 F (36.8 C) (Oral)  Resp 33  Ht 5\' 6"  (1.676 m)  Wt 155 lb (70.308 kg)  BMI 25.03 kg/m2  SpO2 98% Physical Exam  Nursing note and vitals reviewed. Constitutional: She appears well-developed and well-nourished. No distress.  HENT:  Head: Atraumatic.  Eyes: Conjunctivae are normal. Pupils are equal, round, and reactive to light. No scleral icterus.  Neck: Neck supple. No tracheal deviation present.  Cardiovascular: Normal rate.   Pulmonary/Chest: Effort normal and breath sounds normal. No respiratory distress. She exhibits no tenderness.  Abdominal: Soft. Normal appearance. She exhibits no distension. There is no tenderness.  Musculoskeletal: She exhibits no edema.  Left leg rotated and shortened. Distal  pulses palp. Pain left hip. Apart from left hip, good rom bil ext without pain or focal bony tenderness. Distal pulses palp.  CTLS spine, non tender, aligned, no step off.   Neurological: She is alert.  Skin: Skin is warm and dry. No rash noted. She is not diaphoretic.  Psychiatric: She has a normal mood and affect.    ED Course  Procedures (including critical care time) Labs Review  Dg Chest 1 View  01/03/2014   CLINICAL DATA:  Left hip fracture.  Pre-op respiratory exam  EXAM: CHEST - 1 VIEW  COMPARISON:  04/08/2013  FINDINGS: Mild cardiomegaly stable. Dual lead transvenous pacemaker remains in appropriate position. Mild scarring again seen in the left lung base. No evidence of pulmonary infiltrate or edema.  No evidence of pleural effusion. No mass or lymphadenopathy identified.  IMPRESSION: Stable mild cardiomegaly and left basilar scarring. No active disease.   Electronically Signed   By: Myles RosenthalJohn  Stahl M.D.   On: 01/03/2014 15:00   Dg Hip Complete Left  01/03/2014   CLINICAL DATA:  Fall, left hip pain and deformity  EXAM: LEFT HIP - COMPLETE 2+ VIEW  COMPARISON:  None.  FINDINGS: Acute left femoral neck fracture with significant foreshortening of the femoral neck. The anatomy appears most consistent with a transcervical fracture pattern. The femoral head remains located. The patient is rotated toward the left. The visualized bony pelvis remains intact. The bones are osteopenic. Degenerative changes noted throughout the lumbar spine. Unremarkable bowel gas pattern.  IMPRESSION: Acute left femoral neck fracture. Given the leftward rotation, this appears to represent a transcervical femoral neck fracture. A high intertrochanteric pattern is also possible. There is associated significant foreshortening of the femoral neck.   Electronically Signed   By: Malachy MoanHeath  McCullough M.D.   On: 01/03/2014 15:00        Date: 01/03/2014  Rate: 60  Rhythm: normal sinus rhythm  QRS Axis: normal  Intervals:  normal  ST/T Wave abnormalities: normal  Conduction Disutrbances:none  Narrative Interpretation:   Old EKG Reviewed: none available    MDM   Iv ns. Labs. Morphine iv. Xr.  Reviewed nursing notes and prior charts for additional history.   Recheck pain improved.  Discussed w ortho, [place] states plan for or tomorrow.  hospitalist paged for admission.     Suzi RootsKevin E Roshawn Ayala, MD 01/03/14 1515

## 2014-01-03 NOTE — Consult Note (Signed)
Reason for Consult:left hip pain Referring Physician: Dr. [name] is an 80 y.o. female.  HPI: 80 y/o female with PMH of CAD fell today in her yard while trimming bushes.  She denies any LOC.  She c/o aching pain in the left hip that is more severe and sharp with any motion of the L LE.  She denies any h/o smoking or diabetes.  She takes a baby aspirin daily.  No coumadin, plavix or other blood thinners.  No h/o previous hip surgery or injury.  Her daughters-in-law are at the bedside.  Past Medical History  Diagnosis Date  . Pacemaker 08/29/2012    st jude accent DR RF device, model number [id number], serial number [id number], implanted 08/29/2012 for sinus bradycardia, runs of supraventricular tachycardia  last checked 10/23/2012  . ICD (implantable cardiac defibrillator) in place   . SSS (sick sinus syndrome) with PAT and marked bradycardia with 3 sec pauses  08/30/2012  . Status post placement of cardiac pacemaker. 08/29/12 St. Jude device 08/30/2012  . HTN (hypertension) 08/30/2012  . Hyperlipidemia 08/30/2012  . Polyneuropathy in other diseases classified elsewhere   . Macular degeneration   . Coronary artery disease     stent in the LAD artery in 2002  . Venous insufficiency     Past Surgical History  Procedure Laterality Date  . Cardiac catheterization  2006    3 stents placed  . Insert / replace / remove pacemaker  08/29/2012    st jude   . Rotator cuff repair Left   . Abdominal hysterectomy    . Cesarean section    . Kidney stone surgery    . Rotator cuff repair Right   . Cardiac catheterization  06/2001    placement of BiodivYsio 2.5.10mm stent dilated to 2.8m in proximal left anterior descenting stenotic lesion  . Radiofrequency ablation  08/25/2010    ablation of left greater saphenous vein    Family History  Problem Relation Age of Onset  . Heart disease Mother   . Congestive Heart Failure Mother   . Heart disease Father   . Heart attack Father   .  Heart disease Brother     Social History:  reports that she has never smoked. She does not have any smokeless tobacco history on file. She reports that she does not drink alcohol or use illicit drugs.  Allergies: No Known Allergies  Medications: I have reviewed the patient's current medications.  Results for orders placed during the hospital encounter of 01/03/14 (from the past 48 hour(s))  CBC     Status: Abnormal   Collection Time    01/03/14  3:00 PM      Result Value Ref Range   WBC 7.7  4.0 - 10.5 K/uL   RBC 3.89  3.87 - 5.11 MIL/uL   Hemoglobin 11.3 (*) 12.0 - 15.0 g/dL   HCT 36.1  36.0 - 46.0 %   MCV 92.8  78.0 - 100.0 fL   MCH 29.0  26.0 - 34.0 pg   MCHC 31.3  30.0 - 36.0 g/dL   RDW 13.9  11.5 - 15.5 %   Platelets 149 (*) 150 - 400 K/uL  COMPREHENSIVE METABOLIC PANEL     Status: Abnormal   Collection Time    01/03/14  3:00 PM      Result Value Ref Range   Sodium 144  137 - 147 mEq/L   Potassium 3.9  3.7 - 5.3 mEq/L   Chloride 106  96 - 112 mEq/L   CO2 25  19 - 32 mEq/L   Glucose, Bld 110 (*) 70 - 99 mg/dL   BUN 22  6 - 23 mg/dL   Creatinine, Ser 0.76  0.50 - 1.10 mg/dL   Calcium 9.8  8.4 - 10.5 mg/dL   Total Protein 6.4  6.0 - 8.3 g/dL   Albumin 4.0  3.5 - 5.2 g/dL   AST 25  0 - 37 U/L   ALT 17  0 - 35 U/L   Alkaline Phosphatase 49  39 - 117 U/L   Total Bilirubin 0.4  0.3 - 1.2 mg/dL   GFR calc non Af Amer 72 (*) >90 mL/min   GFR calc Af Amer 83 (*) >90 mL/min   Comment: (NOTE)     The eGFR has been calculated using the CKD EPI equation.     This calculation has not been validated in all clinical situations.     eGFR's persistently <90 mL/min signify possible Chronic Kidney     Disease.    Dg Chest 1 View  01/03/2014   CLINICAL DATA:  Left hip fracture.  Pre-op respiratory exam  EXAM: CHEST - 1 VIEW  COMPARISON:  04/08/2013  FINDINGS: Mild cardiomegaly stable. Dual lead transvenous pacemaker remains in appropriate position. Mild scarring again seen in the  left lung base. No evidence of pulmonary infiltrate or edema. No evidence of pleural effusion. No mass or lymphadenopathy identified.  IMPRESSION: Stable mild cardiomegaly and left basilar scarring. No active disease.   Electronically Signed   By: Earle Gell M.D.   On: 01/03/2014 15:00   Dg Hip Complete Left  01/03/2014   CLINICAL DATA:  Fall, left hip pain and deformity  EXAM: LEFT HIP - COMPLETE 2+ VIEW  COMPARISON:  None.  FINDINGS: Acute left femoral neck fracture with significant foreshortening of the femoral neck. The anatomy appears most consistent with a transcervical fracture pattern. The femoral head remains located. The patient is rotated toward the left. The visualized bony pelvis remains intact. The bones are osteopenic. Degenerative changes noted throughout the lumbar spine. Unremarkable bowel gas pattern.  IMPRESSION: Acute left femoral neck fracture. Given the leftward rotation, this appears to represent a transcervical femoral neck fracture. A high intertrochanteric pattern is also possible. There is associated significant foreshortening of the femoral neck.   Electronically Signed   By: Jacqulynn Cadet M.D.   On: 01/03/2014 15:00    ROS:  No recent f/c/n/v/wt loss PE:  Blood pressure 167/61, pulse 78, temperature 98 F (36.7 C), temperature source Oral, resp. rate 21, height '5\' 6"'  (1.676 m), weight 70.308 kg (155 lb), SpO2 95.00%. wn wd elderly woman in nad.  A and O x 4.  Mood and affect normal.  EOMI.  resp unlabored.  L LE with healthy and intact skin.  LE shortened slightly.  No lymphadenopathy.  Sens to LT intact in the left foot.  1+ dp and pt pulses.  5/5 strength in PF and DF of the ankles.    Assessment/Plan: Displaced left femoral neck fracture - to OR for left hip hemi-arthroplasty.  The risks and benefits of the alternative treatment options have been discussed in detail.  The patient wishes to proceed with surgery and specifically understands risks of bleeding,  infection, nerve damage, blood clots, need for additional surgery, amputation and death.  The patient will be admitted by [facility].  She'll be NPO after midnight.    Pamela [name] 01/03/2014, 4:47 PM

## 2014-01-03 NOTE — ED Notes (Signed)
Receiving RN made aware that admitting status will be Tele per admitting. States floor can floor monitor on tele.

## 2014-01-03 NOTE — ED Notes (Signed)
unable to locate the pt for triage

## 2014-01-03 NOTE — ED Notes (Signed)
Pt presents to department via [facility] for evaluation of fall. States she was trimming bushes outside when she fell and landed on L hip/femur. Pain increases with movement and weight bearing. Shortening noted to L side, rotated outward. Pedal pulses present. Pt is alert and oriented x4. 20g LAC. Received 4mg  Morphine per EMS.

## 2014-01-04 ENCOUNTER — Encounter (HOSPITAL_COMMUNITY)
Admission: EM | Disposition: A | Discharge status: Skilled Nursing Facility | Payer: Self-pay | Source: Home / Self Care | Attending: Internal Medicine

## 2014-01-04 ENCOUNTER — Encounter (HOSPITAL_COMMUNITY): Payer: Self-pay | Admitting: Anesthesiology

## 2014-01-04 ENCOUNTER — Inpatient Hospital Stay (HOSPITAL_COMMUNITY): Payer: Medicare Other

## 2014-01-04 ENCOUNTER — Inpatient Hospital Stay (HOSPITAL_COMMUNITY): Payer: Medicare Other | Admitting: Anesthesiology

## 2014-01-04 ENCOUNTER — Encounter (HOSPITAL_COMMUNITY): Payer: Medicare Other | Admitting: Anesthesiology

## 2014-01-04 DIAGNOSIS — R9431 Abnormal electrocardiogram [ECG] [EKG]: Secondary | ICD-10-CM

## 2014-01-04 HISTORY — PX: HIP ARTHROPLASTY: SHX981

## 2014-01-04 LAB — MRSA PCR SCREENING: MRSA BY PCR: NEGATIVE

## 2014-01-04 SURGERY — HEMIARTHROPLASTY, HIP, DIRECT ANTERIOR APPROACH, FOR FRACTURE
Anesthesia: Spinal | Site: Hip | Laterality: Left

## 2014-01-04 MED ORDER — MAGNESIUM CITRATE PO SOLN: 0.5000 | Freq: Once | ORAL | Status: AC | PRN

## 2014-01-04 MED ORDER — EPHEDRINE SULFATE 50 MG/ML IJ SOLN
INTRAMUSCULAR | Status: DC | PRN
  Administered 2014-01-04 (×5): 10 mg via INTRAVENOUS

## 2014-01-04 MED ORDER — PROPOFOL 10 MG/ML IV BOLUS
INTRAVENOUS | Status: DC | PRN
  Administered 2014-01-04: 50 mg via INTRAVENOUS

## 2014-01-04 MED ORDER — DOCUSATE SODIUM 100 MG PO CAPS
100.0000 mg | ORAL_CAPSULE | Freq: Two times a day (BID) | ORAL | Status: DC
  Administered 2014-01-04 – 2014-01-06 (×4): 100 mg via ORAL
  Filled 2014-01-04 (×5): qty 1

## 2014-01-04 MED ORDER — ACETAMINOPHEN 650 MG RE SUPP: 650.0000 mg | Freq: Four times a day (QID) | RECTAL | Status: DC | PRN

## 2014-01-04 MED ORDER — SODIUM CHLORIDE 0.9 % IR SOLN
Status: DC | PRN
  Administered 2014-01-04: 1000 mL

## 2014-01-04 MED ORDER — METHOCARBAMOL 1000 MG/10ML IJ SOLN
500.0000 mg | Freq: Four times a day (QID) | INTRAVENOUS | Status: DC | PRN
  Filled 2014-01-04: qty 5

## 2014-01-04 MED ORDER — PROPOFOL INFUSION 10 MG/ML OPTIME
INTRAVENOUS | Status: DC | PRN
  Administered 2014-01-04: 50 ug/kg/min via INTRAVENOUS

## 2014-01-04 MED ORDER — POLYETHYLENE GLYCOL 3350 17 G PO PACK: 17.0000 g | PACK | Freq: Every day | ORAL | Status: DC | PRN

## 2014-01-04 MED ORDER — METOCLOPRAMIDE HCL 5 MG/ML IJ SOLN: 5.0000 mg | Freq: Three times a day (TID) | INTRAMUSCULAR | Status: DC | PRN

## 2014-01-04 MED ORDER — METHOCARBAMOL 500 MG PO TABS
500.0000 mg | ORAL_TABLET | Freq: Four times a day (QID) | ORAL | Status: DC | PRN
  Administered 2014-01-04 – 2014-01-06 (×4): 500 mg via ORAL
  Filled 2014-01-04 (×4): qty 1

## 2014-01-04 MED ORDER — BUPIVACAINE HCL (PF) 0.75 % IJ SOLN
INTRAMUSCULAR | Status: DC | PRN
  Administered 2014-01-04: 10 mg via INTRATHECAL

## 2014-01-04 MED ORDER — METOCLOPRAMIDE HCL 10 MG PO TABS: 5.0000 mg | ORAL_TABLET | Freq: Three times a day (TID) | ORAL | Status: DC | PRN

## 2014-01-04 MED ORDER — FENTANYL CITRATE 0.05 MG/ML IJ SOLN
INTRAMUSCULAR | Status: DC | PRN
Start: 2014-01-04 — End: 2014-01-04
  Administered 2014-01-04 (×2): 25 ug via INTRAVENOUS

## 2014-01-04 MED ORDER — ASPIRIN EC 325 MG PO TBEC
325.0000 mg | DELAYED_RELEASE_TABLET | Freq: Two times a day (BID) | ORAL | Status: DC
  Administered 2014-01-04 – 2014-01-06 (×5): 325 mg via ORAL
  Filled 2014-01-04 (×6): qty 1

## 2014-01-04 MED ORDER — MIDAZOLAM HCL 5 MG/5ML IJ SOLN
INTRAMUSCULAR | Status: DC | PRN
  Administered 2014-01-04: 0.5 mg via INTRAVENOUS

## 2014-01-04 MED ORDER — SODIUM CHLORIDE 0.9 % IV SOLN
INTRAVENOUS | Status: DC
  Filled 2014-01-04 (×3): qty 1000

## 2014-01-04 MED ORDER — HYDROCODONE-ACETAMINOPHEN 5-325 MG PO TABS: 1.0000 | ORAL_TABLET | Freq: Four times a day (QID) | ORAL | Status: DC | PRN

## 2014-01-04 MED ORDER — ONDANSETRON HCL 4 MG/2ML IJ SOLN: 4.0000 mg | Freq: Four times a day (QID) | INTRAMUSCULAR | Status: DC | PRN

## 2014-01-04 MED ORDER — ACETAMINOPHEN 325 MG PO TABS: 650.0000 mg | ORAL_TABLET | Freq: Four times a day (QID) | ORAL | Status: DC | PRN

## 2014-01-04 MED ORDER — FENTANYL CITRATE 0.05 MG/ML IJ SOLN
INTRAMUSCULAR | Status: AC
  Filled 2014-01-04: qty 5

## 2014-01-04 MED ORDER — PHENOL 1.4 % MT LIQD: 1.0000 | OROMUCOSAL | Status: DC | PRN

## 2014-01-04 MED ORDER — MIDAZOLAM HCL 2 MG/2ML IJ SOLN
INTRAMUSCULAR | Status: AC
  Filled 2014-01-04: qty 2

## 2014-01-04 MED ORDER — HYDROMORPHONE HCL PF 1 MG/ML IJ SOLN
0.5000 mg | INTRAMUSCULAR | Status: DC | PRN
Start: 2014-01-04 — End: 2014-01-06
  Administered 2014-01-04 – 2014-01-05 (×2): 0.5 mg via INTRAVENOUS
  Filled 2014-01-04 (×2): qty 1

## 2014-01-04 MED ORDER — FENTANYL CITRATE 0.05 MG/ML IJ SOLN: 25.0000 ug | INTRAMUSCULAR | Status: DC | PRN

## 2014-01-04 MED ORDER — ASPIRIN EC 325 MG PO TBEC: 325.0000 mg | DELAYED_RELEASE_TABLET | Freq: Two times a day (BID) | ORAL | Status: DC

## 2014-01-04 MED ORDER — ONDANSETRON HCL 4 MG PO TABS: 4.0000 mg | ORAL_TABLET | Freq: Four times a day (QID) | ORAL | Status: DC | PRN

## 2014-01-04 MED ORDER — LACTATED RINGERS IV SOLN
INTRAVENOUS | Status: DC | PRN
  Administered 2014-01-04: 11:00:00 via INTRAVENOUS

## 2014-01-04 MED ORDER — PROMETHAZINE HCL 25 MG/ML IJ SOLN: 6.2500 mg | INTRAMUSCULAR | Status: DC | PRN

## 2014-01-04 MED ORDER — ALUM & MAG HYDROXIDE-SIMETH 200-200-20 MG/5ML PO SUSP: 30.0000 mL | ORAL | Status: DC | PRN

## 2014-01-04 MED ORDER — CEFAZOLIN SODIUM 1-5 GM-% IV SOLN
INTRAVENOUS | Status: DC | PRN
  Administered 2014-01-04: 1 g via INTRAVENOUS

## 2014-01-04 MED ORDER — PROPOFOL 10 MG/ML IV BOLUS
INTRAVENOUS | Status: AC
  Filled 2014-01-04: qty 20

## 2014-01-04 MED ORDER — HYDROCODONE-ACETAMINOPHEN 5-325 MG PO TABS
1.0000 | ORAL_TABLET | Freq: Four times a day (QID) | ORAL | Status: DC | PRN
  Administered 2014-01-04 – 2014-01-06 (×7): 2 via ORAL
  Filled 2014-01-04 (×7): qty 2

## 2014-01-04 MED ORDER — PHENYLEPHRINE HCL 10 MG/ML IJ SOLN
INTRAMUSCULAR | Status: DC | PRN
  Administered 2014-01-04: 80 ug via INTRAVENOUS

## 2014-01-04 MED ORDER — MENTHOL 3 MG MT LOZG: 1.0000 | LOZENGE | OROMUCOSAL | Status: DC | PRN

## 2014-01-04 SURGICAL SUPPLY — 48 items
ADH SKN CLS APL DERMABOND .7 (GAUZE/BANDAGES/DRESSINGS) ×1
BLADE SAW SGTL 18X1.27X75 (BLADE) ×2 IMPLANT
CAPT HIP FX BIPOLAR/UNIPOLAR ×1 IMPLANT
COVER SURGICAL LIGHT HANDLE (MISCELLANEOUS) ×2 IMPLANT
DERMABOND ADVANCED (GAUZE/BANDAGES/DRESSINGS) ×1
DERMABOND ADVANCED .7 DNX12 (GAUZE/BANDAGES/DRESSINGS) ×1 IMPLANT
DRAPE INCISE IOBAN 85X60 (DRAPES) ×2 IMPLANT
DRAPE ORTHO SPLIT 77X108 STRL (DRAPES) ×4
DRAPE SURG ORHT 6 SPLT 77X108 (DRAPES) ×2 IMPLANT
DRAPE U-SHAPE 47X51 STRL (DRAPES) ×2 IMPLANT
DRSG AQUACEL AG ADV 3.5X10 (GAUZE/BANDAGES/DRESSINGS) ×2 IMPLANT
DURAPREP 26ML APPLICATOR (WOUND CARE) ×2 IMPLANT
ELECT BLADE 4.0 EZ CLEAN MEGAD (MISCELLANEOUS) ×2
ELECT REM PT RETURN 9FT ADLT (ELECTROSURGICAL) ×2
ELECTRODE BLDE 4.0 EZ CLN MEGD (MISCELLANEOUS) ×1 IMPLANT
ELECTRODE REM PT RTRN 9FT ADLT (ELECTROSURGICAL) ×1 IMPLANT
EVACUATOR 1/8 PVC DRAIN (DRAIN) IMPLANT
FACESHIELD WRAPAROUND (MASK) ×4 IMPLANT
FACESHIELD WRAPAROUND OR TEAM (MASK) ×2 IMPLANT
GLOVE BIOGEL PI IND STRL 7.5 (GLOVE) ×1 IMPLANT
GLOVE BIOGEL PI IND STRL 8 (GLOVE) ×2 IMPLANT
GLOVE BIOGEL PI INDICATOR 7.5 (GLOVE)
GLOVE BIOGEL PI INDICATOR 8 (GLOVE) ×2
GLOVE ECLIPSE 8.0 STRL XLNG CF (GLOVE) ×2 IMPLANT
GLOVE ORTHO TXT STRL SZ7.5 (GLOVE) ×2 IMPLANT
GLOVE SURG ORTHO 8.0 STRL STRW (GLOVE) ×1 IMPLANT
GOWN STRL REIN 3XL XLG LVL4 (GOWN DISPOSABLE) ×2 IMPLANT
GOWN STRL REUS W/ TWL LRG LVL3 (GOWN DISPOSABLE) ×3 IMPLANT
GOWN STRL REUS W/TWL LRG LVL3 (GOWN DISPOSABLE) ×6
HANDPIECE INTERPULSE COAX TIP (DISPOSABLE)
IMMOBILIZER KNEE 22 UNIV (SOFTGOODS) ×1 IMPLANT
KIT BASIN OR (CUSTOM PROCEDURE TRAY) ×2 IMPLANT
KIT ROOM TURNOVER OR (KITS) ×2 IMPLANT
MANIFOLD NEPTUNE II (INSTRUMENTS) ×1 IMPLANT
NS IRRIG 1000ML POUR BTL (IV SOLUTION) ×2 IMPLANT
PACK TOTAL JOINT (CUSTOM PROCEDURE TRAY) ×2 IMPLANT
PAD ARMBOARD 7.5X6 YLW CONV (MISCELLANEOUS) ×4 IMPLANT
SET HNDPC FAN SPRY TIP SCT (DISPOSABLE) IMPLANT
SPONGE LAP 4X18 X RAY DECT (DISPOSABLE) ×2 IMPLANT
SUT MNCRL AB 4-0 PS2 18 (SUTURE) ×1 IMPLANT
SUT VIC AB 1 CT1 27 (SUTURE) ×4
SUT VIC AB 1 CT1 27XBRD ANBCTR (SUTURE) ×2 IMPLANT
SUT VIC AB 2-0 CT1 27 (SUTURE) ×2
SUT VIC AB 2-0 CT1 TAPERPNT 27 (SUTURE) IMPLANT
TOWEL OR 17X24 6PK STRL BLUE (TOWEL DISPOSABLE) ×2 IMPLANT
TOWEL OR 17X26 10 PK STRL BLUE (TOWEL DISPOSABLE) ×2 IMPLANT
TRAY FOLEY CATH 14FR (SET/KITS/TRAYS/PACK) IMPLANT
WATER STERILE IRR 1000ML POUR (IV SOLUTION) ×4 IMPLANT

## 2014-01-04 NOTE — Anesthesia Procedure Notes (Signed)
Spinal  Patient location during procedure: OR Start time: 01/04/2014 10:30 AM End time: 01/04/2014 10:35 AM Staffing Anesthesiologist: Alzora Ha Performed by: anesthesiologist  Preanesthetic Checklist Completed: patient identified, site marked, surgical consent, pre-op evaluation, timeout performed, IV checked, risks and benefits discussed and monitors and equipment checked Spinal Block Patient position: left lateral decubitus Prep: Betadine Patient monitoring: heart rate, cardiac monitor, continuous pulse ox and blood pressure Approach: left paramedian Location: L3-4 Injection technique: single-shot Needle Needle type: Tuohy  Needle gauge: 22 G Needle length: 5 cm Needle insertion depth: 3 cm Assessment Sensory level: T6 Additional Notes Sedation. LLD position. Tolerated well

## 2014-01-04 NOTE — Progress Notes (Signed)
PT Cancellation Note  Patient Details Name: Pamela [name] MRN: [id number] DOB: 07/18/1921   Cancelled Treatment:    Reason Eval/Treat Not Completed: Patient not medically ready.  Pt to OR today.  PT eval to be completed tomorrow, 01-05-14.   Danelle Berry 01/04/2014, 9:27 AM  Aida Raider, PT  Office # [phone] Pager [phone]

## 2014-01-04 NOTE — Anesthesia Preprocedure Evaluation (Signed)
Anesthesia Evaluation  Patient identified by MRN, date of birth, ID band Patient awake    Reviewed: Allergy & Precautions, H&P , NPO status , Patient's Chart, lab work & pertinent test results  Airway       Dental   Pulmonary neg pulmonary ROS,          Cardiovascular hypertension, + CAD, + Cardiac Stents and + Peripheral Vascular Disease + dysrhythmias + pacemaker     Neuro/Psych  Neuromuscular disease    GI/Hepatic   Endo/Other    Renal/GU      Musculoskeletal   Abdominal   Peds  Hematology   Anesthesia Other Findings   Reproductive/Obstetrics                           Anesthesia Physical Anesthesia Plan  ASA: III  Anesthesia Plan: Spinal   Post-op Pain Management:    Induction: Intravenous  Airway Management Planned: Natural Airway  Additional Equipment:   Intra-op Plan:   Post-operative Plan:   Informed Consent: I have reviewed the patients History and Physical, chart, labs and discussed the procedure including the risks, benefits and alternatives for the proposed anesthesia with the patient or authorized representative who has indicated his/her understanding and acceptance.   Dental advisory given  Plan Discussed with: CRNA and Surgeon  Anesthesia Plan Comments:         Anesthesia Quick Evaluation

## 2014-01-04 NOTE — Progress Notes (Signed)
Orthopedic Tech Progress Note Patient Details:  Pamela [name] [birth year] [id number]  Ortho Devices Ortho Device/Splint Location: trapeze bar patient helper Ortho Device/Splint Interventions: Application   Franciscojavier Wronski 01/04/2014, 3:00 PM

## 2014-01-04 NOTE — Progress Notes (Signed)
[facility] PROGRESS NOTE  Pamela [name] BLT:[id number] DOB: [birth year] DOA: 01/03/2014 PCP: Kirk Ruths, MD  Assessment/Plan: Hip fracture, left  Secondary to mechanical fall.  Pain control with when necessary Tylenol and oral dose of Dilaudid. IV Robaxin when necessary for muscle spasms  For OR this am. -PT eval post surgery. Will need skilled nursing facility and family wish [facility] [place]. Consult social work  -Patient on bystolic which will be continued for perioperative beta blockade.  - bowel regimen   Active Problems:  Status post placement of cardiac pacemaker. 08/29/12 St. Jude device  Stable. Monitor on telemetry   HTN (hypertension)  Pressure stable. Resume home medications   Osteoporosis, unspecified  Dear Tylenol and when necessary Motrin   Cardiac dysrhythmia,  Stable. Monitor on telemetry. Has pacemaker   Chronic ischemic heart disease, unspecified  Continue aspirin, Zetia, Lasix, ramipril and bystolic   mild QT prolongation on EKG Monitor on telemetry   Code Status: full Family Communication: daughter at bedside Disposition Plan: needs SNF   Consultants:  ortho  Procedures:  For hip sx today  Antibiotics:  none  HPI/Subjective: Pt c/o pain over left hip  Objective: Filed Vitals:   01/04/14 0500  BP: 129/59  Pulse: 63  Temp: 98.3 F (36.8 C)  Resp: 18    Intake/Output Summary (Last 24 hours) at 01/04/14 1124 Last data filed at 01/04/14 1100  Gross per 24 hour  Intake    480 ml  Output   1350 ml  Net   -870 ml   Filed Weights   01/03/14 1308  Weight: 70.308 kg (155 lb)    Exam:   General: Elderly female in no acute distress  HEENT: No pallor, muscle to mucosa  Chest: Clear to auscultation bilaterally, no added sound  Cardiovascular: Normal S1 and S2, no murmurs or gallop  Abdomen: , Nontender, nondistended, bowel sounds present  Musculoskeletal: Left leg externally  rotated. Limited ROM due to pain  CNS: AAOX3  Data Reviewed: Basic Metabolic Panel:  Recent Labs Lab 01/03/14 1500  NA 144  K 3.9  CL 106  CO2 25  GLUCOSE 110*  BUN 22  CREATININE 0.76  CALCIUM 9.8   Liver Function Tests:  Recent Labs Lab 01/03/14 1500  AST 25  ALT 17  ALKPHOS 49  BILITOT 0.4  PROT 6.4  ALBUMIN 4.0   No results found for this basename: LIPASE, AMYLASE,  in the last 168 hours No results found for this basename: AMMONIA,  in the last 168 hours CBC:  Recent Labs Lab 01/03/14 1500  WBC 7.7  HGB 11.3*  HCT 36.1  MCV 92.8  PLT 149*   Cardiac Enzymes: No results found for this basename: CKTOTAL, CKMB, CKMBINDEX, TROPONINI,  in the last 168 hours BNP (last 3 results)  Recent Labs  04/08/13 1334  PROBNP 533.7*   CBG: No results found for this basename: GLUCAP,  in the last 168 hours  Recent Results (from the past 240 hour(s))  MRSA PCR SCREENING     Status: None   Collection Time    01/04/14  6:31 AM      Result Value Ref Range Status   MRSA by PCR NEGATIVE  NEGATIVE Final   Comment:            The GeneXpert MRSA Assay (FDA     approved for NASAL specimens     only), is one component of a  comprehensive MRSA colonization     surveillance program. It is not     intended to diagnose MRSA     infection nor to guide or     monitor treatment for     MRSA infections.     Studies: Dg Chest 1 View  01/03/2014   CLINICAL DATA:  Left hip fracture.  Pre-op respiratory exam  EXAM: CHEST - 1 VIEW  COMPARISON:  04/08/2013  FINDINGS: Mild cardiomegaly stable. Dual lead transvenous pacemaker remains in appropriate position. Mild scarring again seen in the left lung base. No evidence of pulmonary infiltrate or edema. No evidence of pleural effusion. No mass or lymphadenopathy identified.  IMPRESSION: Stable mild cardiomegaly and left basilar scarring. No active disease.   Electronically Signed   By: Myles RosenthalJohn  Stahl M.D.   On: 01/03/2014 15:00   Dg  Hip Complete Left  01/03/2014   CLINICAL DATA:  Fall, left hip pain and deformity  EXAM: LEFT HIP - COMPLETE 2+ VIEW  COMPARISON:  None.  FINDINGS: Acute left femoral neck fracture with significant foreshortening of the femoral neck. The anatomy appears most consistent with a transcervical fracture pattern. The femoral head remains located. The patient is rotated toward the left. The visualized bony pelvis remains intact. The bones are osteopenic. Degenerative changes noted throughout the lumbar spine. Unremarkable bowel gas pattern.  IMPRESSION: Acute left femoral neck fracture. Given the leftward rotation, this appears to represent a transcervical femoral neck fracture. A high intertrochanteric pattern is also possible. There is associated significant foreshortening of the femoral neck.   Electronically Signed   By: Malachy MoanHeath  McCullough M.D.   On: 01/03/2014 15:00    Scheduled Meds: . [facility][MAR HOLD] aspirin EC  81 mg Oral Daily  . [facility][MAR HOLD] atorvastatin  10 mg Oral q1800  . [MAR HOLD] B-complex with vitamin C  1 tablet Oral BID  . [MAR HOLD] beta carotene w/minerals  1 tablet Oral Daily  . [facility][MAR HOLD] cholecalciferol  5,000 Units Oral Daily  . [MAR HOLD] enoxaparin (LOVENOX) injection  40 mg Subcutaneous Q24H  . [facility][MAR HOLD] ezetimibe  10 mg Oral Daily  . [facility][MAR HOLD] ferrous sulfate  325 mg Oral QODAY  . [MAR HOLD] nebivolol  5 mg Oral Daily  . [facility][MAR HOLD] ramipril  2.5 mg Oral Daily  . [facility][MAR HOLD] sodium chloride  3 mL Intravenous Q12H   Continuous Infusions: . sodium chloride         Time spent: 25 minutes    Pamela [name]  [facility] Pager [phone] If 7PM-7AM, please contact night-coverage at [email or web address], password [email or web address] 01/04/2014, 11:24 AM  LOS: 1 day

## 2014-01-04 NOTE — Anesthesia Postprocedure Evaluation (Signed)
  Anesthesia Post-op Note  Patient: Pamela [name]  Procedure(s) Performed: Procedure(s): ARTHROPLASTY BIPOLAR HIP (Left)  Patient Location: PACU  Anesthesia Type:Spinal  Level of Consciousness: awake and alert   Airway and Oxygen Therapy: Patient Spontanous Breathing  Post-op Pain: none  Post-op Assessment: Post-op Vital signs reviewed  Post-op Vital Signs: stable  Last Vitals:  Filed Vitals:   01/04/14 1142  BP: 103/40  Pulse: 83  Temp: 36.4 C  Resp: 19    Complications: No apparent anesthesia complications

## 2014-01-04 NOTE — Progress Notes (Signed)
Patient ID: Pamela [name], female   DOB: [birth year], 80 y.o.   MRN: [id number]  Displaced left femoral neck fracture after falling in garden  NPO Reviewed fracture she and her daughter in law To OR today for left hip hemiarthroplasty Consent ordered

## 2014-01-04 NOTE — Transfer of Care (Signed)
Immediate Anesthesia Transfer of Care Note  Patient: Pamela [name]  Procedure(s) Performed: Procedure(s): ARTHROPLASTY BIPOLAR HIP (Left)  Patient Location: PACU  Anesthesia Type:Spinal  Level of Consciousness: awake, alert , oriented and sedated  Airway & Oxygen Therapy: Patient Spontanous Breathing and Patient connected to nasal cannula oxygen  Post-op Assessment: Report given to PACU RN, Post -op Vital signs reviewed and stable and Patient moving all extremities  Post vital signs: Reviewed and stable  Complications: No apparent anesthesia complications

## 2014-01-04 NOTE — Op Note (Signed)
NAME:  Pamela [name], Pamela [name]                ACCOUNT NO.:  [id number]   MEDICAL RECORD NO.: [id number]   LOCATION:  1435                         FACILITY:  [facility]   DATE OF BIRTH:  10/10/2020  PHYSICIAN:  Madlyn FrankelMatthew D. Charlann Boxerlin, M.D.     DATE OF PROCEDURE:  01/04/2014                               OPERATIVE REPORT     PREOPERATIVE DIAGNOSIS:  Left displaced femoral neck fracture.   POSTOPERATIVE DIAGNOSIS:  Left displaced femoral neck fracture.   PROCEDURE:  Left hip hemiarthroplasty utilizing DePuy component, size 3 standard Press Alcoa [employer]Fit Summit Basic stem with a 45mm unipolar ball with a +0 adapter.   SURGEON:  Madlyn FrankelMatthew D. Charlann Boxerlin, MD   ASSISTANT:  Lanney GinsMatthew Babish, PA-C.   ANESTHESIA:  General.   SPECIMENS:  None.   DRAINS: None   BLOOD LOSS:  About 150 cc.   COMPLICATIONS:  None.   INDICATION OF PROCEDURE:  Pamela [name] is a pleasant 80 year old female who lives independently.  She unfortunately had a fall while in her back yard doing some gardeing.  She was admitted to the hospital after radiographs revealed a femoral neck fracture.  She was seen and evaluated and was scheduled for surgery for fixation.  The necessity of surgical repair was discussed with she and her family.  Consent was obtained after reviewing risks of infection, DVT, component failure, and need for revision surgery.   PROCEDURE IN DETAIL:  The patient was brought to the operative theater. Once adequate anesthesia, preoperative antibiotics, 2 g of Ancef administered, the patient was positioned into the right lateral decubitus position with the left side up.  The left lower extremity was then prepped and draped in sterile fashion.  A time-out was performed identifying the patient, planned procedure, and extremity.   A lateral incision was made off the proximal trochanter. Sharp dissection was carried down to the iliotibial band and gluteal fascia. The gluteal fascia was then incised for posterior approach.  The  short external rotators were taken down separate from the posterior capsule. An L capsulotomy was made preserving the posterior leaflet for later anatomic repair. Fracture site was identified and after removing comminuted segments of the posterior femoral neck, the femoral head was removed without difficulty and measured on the back table  using the sizing rings and determined to be 45 mm in diameter.   The proximal femur was then exposed.  Retractors placed.  I then drilled, opened the proximal femur.  Then I hand reamed once and  Irrigated the canal to try to prevent fat emboli.  I began broaching the femur with a size 1 broach up to a size 3 broach with good medial and lateral metaphyseal fit without evidence of any torsion or movement.  A trial reduction was carried out with a standard neck and a +0 adapter with a 45mm head ball.  The hip reduced nicely.  The leg lengths appeared to be equal compared to the down leg.   The hip went through a range of motion without evidence of any subluxation or impingement.   Given these findings, the trial components removed.  The final size 3 standard  [employer] Fit Summit Basic  stem was opened.  After irrigating the canal, the final stem was impacted and sat at the level where the broach was. Based on this and the trial reduction, a +0 adapter was opened and impacted in the 27mm unipolar ball onto a clean and dry trunnion.  The hip had been irrigated throughout the case and again at this point.  I re- Approximated the posterior capsule to the superior leaflet using a  #1 Vicryl.  The remainder of the wound was closed with #1 Vicryl in the iliotibial band and gluteal fascia, a  2-0 Vicryl in the sub-Q tissue and a running 4-0 Monocryl in the skin.  The hip was cleaned, dried, and dressed sterilely using Dermabond and Aquacel dressing.  Drain site was dressed separately.  She was then brought to recovery room, extubated in stable condition,  tolerating the procedure well.  Lanney Gins, PA-C was present and utilized as Geophysicist/field seismologist for the entire case from  Preoperative positioning to management of the contralateral extremity and retractors to  General facilitation of the procedure.  He was also involved with primary wound closure.         Madlyn Frankel Charlann Boxer, M.D.

## 2014-01-05 ENCOUNTER — Encounter (HOSPITAL_COMMUNITY): Payer: Self-pay | Admitting: Orthopedic Surgery

## 2014-01-05 DIAGNOSIS — I10 Essential (primary) hypertension: Secondary | ICD-10-CM

## 2014-01-05 LAB — CBC
HEMATOCRIT: 32.6 % — AB (ref 36.0–46.0)
HEMOGLOBIN: 10 g/dL — AB (ref 12.0–15.0)
MCH: 28.9 pg (ref 26.0–34.0)
MCHC: 30.7 g/dL (ref 30.0–36.0)
MCV: 94.2 fL (ref 78.0–100.0)
Platelets: 124 10*3/uL — ABNORMAL LOW (ref 150–400)
RBC: 3.46 MIL/uL — ABNORMAL LOW (ref 3.87–5.11)
RDW: 14.2 % (ref 11.5–15.5)
WBC: 5.6 10*3/uL (ref 4.0–10.5)

## 2014-01-05 LAB — BASIC METABOLIC PANEL
BUN: 14 mg/dL (ref 6–23)
CO2: 28 mEq/L (ref 19–32)
CREATININE: 0.67 mg/dL (ref 0.50–1.10)
Calcium: 8.5 mg/dL (ref 8.4–10.5)
Chloride: 105 mEq/L (ref 96–112)
GFR, EST AFRICAN AMERICAN: 86 mL/min — AB (ref >90)
GFR, EST NON AFRICAN AMERICAN: 75 mL/min — AB (ref >90)
GLUCOSE: 145 mg/dL — AB (ref 70–99)
POTASSIUM: 4.2 meq/L (ref 3.7–5.3)
Sodium: 141 mEq/L (ref 137–147)

## 2014-01-05 MED ORDER — HYDROCODONE-ACETAMINOPHEN 5-325 MG PO TABS: 1.0000 | ORAL_TABLET | Freq: Four times a day (QID) | ORAL | Status: DC | PRN

## 2014-01-05 MED ORDER — POLYETHYLENE GLYCOL 3350 17 G PO PACK
17.0000 g | PACK | Freq: Every day | ORAL | Status: DC
  Administered 2014-01-05 – 2014-01-06 (×2): 17 g via ORAL
  Filled 2014-01-05 (×3): qty 1

## 2014-01-05 NOTE — Progress Notes (Signed)
Utilization review completed.  

## 2014-01-05 NOTE — Clinical Social Work Placement (Addendum)
Clinical Social Work Department  CLINICAL SOCIAL WORK PLACEMENT NOTE    Patient: CHERITH LILEY  Account Number: [id number]  Admit date: 01/03/14 Clinical Social Worker: Sabino Niemann LCSWA Date/time: 6/8/201511:30 AM  Clinical Social Work is seeking post-discharge placement for this patient at the following level of care: SKILLED NURSING (*CSW will update this form in Epic as items are completed)  01/05/2014 Patient/family provided with [facility] Department of Clinical Social Work's list of facilities offering this level of care within the geographic area requested by the patient (or if unable, by the patient's family).  07/07/2012 Patient/family informed of their freedom to choose among providers that offer the needed level of care, that participate in Medicare, Medicaid or managed care program needed by the patient, have an available bed and are willing to accept the patient.  01/05/2014 Patient/family informed of [facility]' ownership interest in [facility], as well as of the fact that they are under no obligation to receive care at this facility.  PASARR submitted to EDS on 01/06/2014 PASARR number received from EDS on 01/06/2014 FL2 transmitted to all facilities in geographic area requested by pt/family on 01/05/2014 FL2 transmitted to all facilities within larger geographic area on  Patient informed that his/her managed care company has contracts with or will negotiate with certain facilities, including the following:  Patient/family informed of bed offers received: 12/05/13 Patient chooses bed at [facility] Physician recommends and patient chooses bed at  Patient to be transferred to on 01/06/2014 Patient to be transferred to facility by [facility] The following physician request were entered in Epic:  Additional Comments:

## 2014-01-05 NOTE — Progress Notes (Signed)
[facility] PROGRESS NOTE  Pamela [name]:[id number] DOB: 07/13/1921 DOA: 01/03/2014 PCP: Kirk Ruths, MD  Brief narrative 80 year old from a functional female with history of hypertension, status post pacemaker for sinus bradycardia, history of ICD for sick sinus syndrome, hypertension, hyperlipidemia, CAD with ischemic heart disease presented with a mechanical fall at home and landed on her left hip.  In the ED x-ray showed acute left femoral neck fracture. Patient taken to OR on 6/7 with left hip hemiarthroplasty and tolerated surgery well.  Assessment/Plan: Hip fracture, left  Secondary to mechanical fall.  Status post left hip hemiarthroplasty on 6/7. Tolerated surgery well. Pain control Vicodin when necessary and Robaxin for muscle spasms. Aspirin 325 mg twice a day for DVT prophylaxis. Foley removed. -MiraLax daily. PT eval recommends skilled nursing facility. Family interested in [place] [facility] [place].  Active Problems:  Status post placement of cardiac pacemaker. 08/29/12 St. Jude device  Stable.   HTN (hypertension)   stable. Continue home medications   Osteoporosis, unspecified  On  Tylenol and when necessary Motrin   Cardiac dysrhythmia,  Stable.  Has pacemaker   Chronic ischemic heart disease, unspecified  Continue aspirin, Zetia, Lasix, ramipril and bystolic   mild QT prolongation on EKG Stable on telemetry.   Code Status: full Family Communication: daughter at bedside Disposition Plan: Skilled nursing facility likely tomorrow.   Consultants:  ortho  Procedures:  Left hip hemiarthroplasty on 67  Antibiotics:  none  HPI/Subjective: Pt c/o pain over left hip  Objective: Filed Vitals:   01/05/14 1239  BP: 119/47  Pulse: 79  Temp: 99.3 F (37.4 C)  Resp: 18    Intake/Output Summary (Last 24 hours) at 01/05/14 1321 Last data filed at 01/05/14 1100  Gross per 24 hour  Intake   1275 ml  Output   1100 ml  Net    175 ml    Filed Weights   01/03/14 1308  Weight: 70.308 kg (155 lb)    Exam:   General: Elderly female in no acute distress  HEENT: No pallor, muscle to mucosa  Chest: Clear to auscultation bilaterally, no added sound  Cardiovascular: Normal S1 and S2, no murmurs or gallop  Abdomen: , Nontender, nondistended, bowel sounds present  Musculoskeletal: Dressing over left hip intact.  CNS: AAOX3  Data Reviewed: Basic Metabolic Panel:  Recent Labs Lab 01/03/14 1500 01/05/14 0446  NA 144 141  K 3.9 4.2  CL 106 105  CO2 25 28  GLUCOSE 110* 145*  BUN 22 14  CREATININE 0.76 0.67  CALCIUM 9.8 8.5   Liver Function Tests:  Recent Labs Lab 01/03/14 1500  AST 25  ALT 17  ALKPHOS 49  BILITOT 0.4  PROT 6.4  ALBUMIN 4.0   No results found for this basename: LIPASE, AMYLASE,  in the last 168 hours No results found for this basename: AMMONIA,  in the last 168 hours CBC:  Recent Labs Lab 01/03/14 1500 01/05/14 0446  WBC 7.7 5.6  HGB 11.3* 10.0*  HCT 36.1 32.6*  MCV 92.8 94.2  PLT 149* 124*   Cardiac Enzymes: No results found for this basename: CKTOTAL, CKMB, CKMBINDEX, TROPONINI,  in the last 168 hours BNP (last 3 results)  Recent Labs  04/08/13 1334  PROBNP 533.7*   CBG: No results found for this basename: GLUCAP,  in the last 168 hours  Recent Results (from the past 240 hour(s))  MRSA PCR SCREENING     Status: None   Collection Time  01/04/14  6:31 AM      Result Value Ref Range Status   MRSA by PCR NEGATIVE  NEGATIVE Final   Comment:            The GeneXpert MRSA Assay (FDA     approved for NASAL specimens     only), is one component of a     comprehensive MRSA colonization     surveillance program. It is not     intended to diagnose MRSA     infection nor to guide or     monitor treatment for     MRSA infections.     Studies: Dg Chest 1 View  01/03/2014   CLINICAL DATA:  Left hip fracture.  Pre-op respiratory exam  EXAM: CHEST - 1 VIEW   COMPARISON:  04/08/2013  FINDINGS: Mild cardiomegaly stable. Dual lead transvenous pacemaker remains in appropriate position. Mild scarring again seen in the left lung base. No evidence of pulmonary infiltrate or edema. No evidence of pleural effusion. No mass or lymphadenopathy identified.  IMPRESSION: Stable mild cardiomegaly and left basilar scarring. No active disease.   Electronically Signed   By: Myles RosenthalJohn  Stahl M.D.   On: 01/03/2014 15:00   Dg Hip Complete Left  01/03/2014   CLINICAL DATA:  Fall, left hip pain and deformity  EXAM: LEFT HIP - COMPLETE 2+ VIEW  COMPARISON:  None.  FINDINGS: Acute left femoral neck fracture with significant foreshortening of the femoral neck. The anatomy appears most consistent with a transcervical fracture pattern. The femoral head remains located. The patient is rotated toward the left. The visualized bony pelvis remains intact. The bones are osteopenic. Degenerative changes noted throughout the lumbar spine. Unremarkable bowel gas pattern.  IMPRESSION: Acute left femoral neck fracture. Given the leftward rotation, this appears to represent a transcervical femoral neck fracture. A high intertrochanteric pattern is also possible. There is associated significant foreshortening of the femoral neck.   Electronically Signed   By: Malachy MoanHeath  McCullough M.D.   On: 01/03/2014 15:00   Dg Pelvis Portable  01/04/2014   CLINICAL DATA:  Status post left hip arthroplasty.  EXAM: PORTABLE PELVIS 1-2 VIEWS  COMPARISON:  01/03/2014.  FINDINGS: Single view of the pelvis demonstrates only of the lower pelvis and upper femurs. Compared to the prior examination there are postoperative changes of left hip bipolar hemiarthroplasty. The prosthetic femoral head projects within the acetabulum on this single view examination. There is gas within the joint space and the adjacent soft tissues. Femoral stem appears well seated within the proximal femoral diaphysis without periprosthetic fracture or other  immediate complicating features. Visualized portions of the lower pelvis and right proximal femur are unremarkable.  IMPRESSION: 1. Expected postoperative changes of left hip bipolar hemiarthroplasty without acute complicating features, as above.   Electronically Signed   By: Trudie Reedaniel  Entrikin M.D.   On: 01/04/2014 13:58    Scheduled Meds: . aspirin EC  325 mg Oral BID  . atorvastatin  10 mg Oral q1800  . B-complex with vitamin C  1 tablet Oral BID  . beta carotene w/minerals  1 tablet Oral Daily  . cholecalciferol  5,000 Units Oral Daily  . docusate sodium  100 mg Oral BID  . ezetimibe  10 mg Oral Daily  . ferrous sulfate  325 mg Oral QODAY  . nebivolol  5 mg Oral Daily  . ramipril  2.5 mg Oral Daily  . sodium chloride  3 mL Intravenous Q12H   Continuous Infusions: . sodium  chloride 0.9 % 1,000 mL with potassium chloride 10 mEq infusion         Time spent: 25 minutes    Marymargaret Kirker  [facility] Pager [phone] If 7PM-7AM, please contact night-coverage at [email or web address], password [email or web address] 01/05/2014, 1:21 PM  LOS: 2 days

## 2014-01-05 NOTE — Evaluation (Signed)
Physical Therapy Evaluation Patient Details Name: Pamela [name] MRN: [id number] DOB: Feb 03, 1921 Today's Date: 01/05/2014   History of Present Illness  80 y.o. female s/p left hip hemiarthroplasty after sustaning a femoral neck fracture with mechanical fall.  Clinical Impression  Patient is seen following the above procedure and presents with functional limitations due to the deficits listed below (see PT Problem List). Patient will benefit from skilled PT to increase their independence and safety with mobility to allow discharge to the venue listed below. Pt and daughter request pt d/c to [facility] center [place] due to pt living alone. Case management and social worker informed.      Follow Up Recommendations SNF;Supervision for mobility/OOB    Equipment Recommendations  3in1 (PT)    Recommendations for Other Services       Precautions / Restrictions Precautions Precautions: Posterior Hip Precaution Booklet Issued: Yes (comment) Precaution Comments: Reviewed precautions and WB status Restrictions Weight Bearing Restrictions: Yes LLE Weight Bearing: Partial weight bearing LLE Partial Weight Bearing Percentage or Pounds: 50%      Mobility  Bed Mobility Overal bed mobility: Needs Assistance;+2 for physical assistance Bed Mobility: Supine to Sit     Supine to sit: Min assist;HOB elevated;+2 for physical assistance     General bed mobility comments: Min assist +2 for trunk control, LLE support, and VCs for technique while maintaining posterior hip precautions  Transfers Overall transfer level: Needs assistance Equipment used: Rolling walker (2 wheeled) Transfers: Sit to/from Stand Sit to Stand: Min assist;+2 physical assistance         General transfer comment: Min assist +2 for boost/lift, from lowest bed setting. VCs for hand placement. Leans heavily over RW.  Ambulation/Gait Ambulation/Gait assistance: Min assist;+2 safety/equipment Ambulation Distance (Feet):  6 Feet Assistive device: Rolling walker (2 wheeled) Gait Pattern/deviations: Step-to pattern;Decreased step length - right;Decreased step length - left;Decreased stance time - left;Decreased stride length;Decreased weight shift to left;Antalgic;Shuffle;Trunk flexed   Gait velocity interpretation: Below normal speed for age/gender General Gait Details: Min assist for RW control and placement with second person available for safety. Max verbal cues for sequencing and safe use of DME. Encouraged to use UEs more to maintain 50% WB status on LLE.  Stairs            Wheelchair Mobility    Modified Rankin (Stroke Patients Only)       Balance Overall balance assessment: Needs assistance Sitting-balance support: No upper extremity supported;Feet supported Sitting balance-Leahy Scale: Fair     Standing balance support: Bilateral upper extremity supported Standing balance-Leahy Scale: Poor                               Pertinent Vitals/Pain 8/10 pain Nurse aware - administered medication during therapy session Patient repositioned in chair for comfort.     Home Living Family/patient expects to be discharged to:: Skilled nursing facility Living Arrangements: Alone Available Help at Discharge: Skilled Nursing Facility Type of Home: House Home Access: Stairs to enter Entrance Stairs-Rails: Left Entrance Stairs-Number of Steps: 3 Home Layout: One level Home Equipment: Grab bars - tub/shower;Grab bars - toilet;Shower seat;Toilet riser;Walker - 2 wheels      Prior Function Level of Independence: Independent               Hand Dominance   Dominant Hand: Right    Extremity/Trunk Assessment   Upper Extremity Assessment: Overall WFL for tasks assessed  Lower Extremity Assessment: LLE deficits/detail   LLE Deficits / Details: decreased strength and ROM - Hx of decreased sensation     Communication   Communication: No difficulties   Cognition Arousal/Alertness: Awake/alert Behavior During Therapy: WFL for tasks assessed/performed Overall Cognitive Status: Within Functional Limits for tasks assessed                      General Comments General comments (skin integrity, edema, etc.): 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    Exercises Total Joint Exercises Ankle Circles/Pumps: AROM;Both;10 reps;Seated Quad Sets: AROM;Both;10 reps;Seated      Assessment/Plan    PT Assessment Patient needs continued PT services  PT Diagnosis Difficulty walking;Abnormality of gait;Acute pain   PT Problem List Decreased strength;Decreased range of motion;Decreased activity tolerance;Decreased balance;Decreased mobility;Decreased knowledge of use of DME;Decreased knowledge of precautions;Impaired sensation;Pain  PT Treatment Interventions DME instruction;Gait training;Functional mobility training;Therapeutic activities;Therapeutic exercise;Balance training;Neuromuscular re-education;Patient/family education;Modalities   PT Goals (Current goals can be found in the Care Plan section) Acute Rehab PT Goals Patient Stated Goal: Go home PT Goal Formulation: With patient Time For Goal Achievement: 01/12/14 Potential to Achieve Goals: Good    Frequency Min 3X/week   Barriers to discharge Decreased caregiver support Pt lives alone    Co-evaluation               End of Session Equipment Utilized During Treatment: Gait belt Activity Tolerance: Patient tolerated treatment well Patient left: in chair;with call bell/phone within reach;with family/visitor present Nurse Communication: Mobility status         Time: 0927-1002 PT Time Calculation (min): 35 min   Charges:   PT Evaluation $Initial PT Evaluation Tier I: 1 Procedure PT Treatments $Gait Training: 8-22 mins $Therapeutic Activity: 8-22 mins   PT G CodesSunday [name]:         Pamela [name], [place]PT [phone]  Pamela [name] 01/05/2014, 10:37 AM

## 2014-01-05 NOTE — Progress Notes (Signed)
   Subjective: 1 Day Post-Op Procedure(s) (LRB): ARTHROPLASTY BIPOLAR HIP (Left)   Patient reports pain as moderate, pain not fully controlled at this time with Vicodin.  We have discussed changing pain medication, but she doesn't wish to at this time. No events throughout the night. Feels that she has done well with PT, even though it was a little painful when they got her up.  Objective:   VITALS:   Filed Vitals:   01/05/14 1500  BP: 118/48  Pulse: 72  Temp: 99 F (37.2 C)  Resp: 18    Neurovascular intact Dorsiflexion/Plantar flexion intact Incision: dressing C/D/I No cellulitis present Compartment soft  LABS  Recent Labs  01/03/14 1500 01/05/14 0446  HGB 11.3* 10.0*  HCT 36.1 32.6*  WBC 7.7 5.6  PLT 149* 124*     Recent Labs  01/03/14 1500 01/05/14 0446  NA 144 141  K 3.9 4.2  BUN 22 14  CREATININE 0.76 0.67  GLUCOSE 110* 145*     Assessment/Plan: 1 Day Post-Op Procedure(s) (LRB): ARTHROPLASTY BIPOLAR HIP (Left) Up with therapy Discharge to SNF eventually, when ready Orthopaedically stable  Ortho recommendations: 50% WB on the left leg ASA 325 mg bid for 4 weeks for anticoagulation, unless other medically indicated. Norco for pain management (Rx written). MiraLax and Colace for constipation Iron 325 mg tid for 2-3 weeks  Dressing to remain in place until follow in clinic in 2 weeks. Dressing is waterproof and may shower with it in place. Follow up in 2 weeks at [facility]. Follow up with OLIN,Mairany Bruno D in 2 weeks.  Contact information:  [facility] [address] [place] [address] [phone]       Anastasio Auerbach. Niklaus Mamaril   PAC  01/05/2014, 6:15 PM

## 2014-01-05 NOTE — Progress Notes (Signed)
OT Cancellation Note  Patient Details Name: Pamela [name] MRN: [id number] DOB: August 20, 1920   Cancelled Treatment:    Reason Eval/Treat Not Completed: OT screened.  Pt's current D/C plan is SNF. No apparent immediate acute care OT needs, therefore will defer OT to SNF. If OT eval is needed please call Acute Rehab Dept. at [phone] or text page OT at [phone].     Earlie Raveling OTR/L [phone] 01/05/2014, 11:20 AM

## 2014-01-05 NOTE — Consult Note (Addendum)
WOC wound consult note Reason for Consult: Consult requested for wounds to buttocks.  Pt fell and has been to the OR recently. Wound type: 3 areas of partial thickness open wounds; appearance consistent with shear injuries.   Pressure Ulcer POA: These are NOT pressure ulcers Measurement: Each site .2X.2X.1cm Wound bed: Red and moist Drainage (amount, consistency, odor) No odor or drainage Periwound: Intact skin surrounding Dressing procedure/placement/frequency: Foam dressing to protect and promote healing.  Discussed etiology and topical treatment with patient and daughter at bedside, they verbalize understanding. Please re-consult if further assistance is needed.  Thank-you,  Cammie Mcgee MSN, RN, CWOCN, [place], CNS [phone]

## 2014-01-05 NOTE — Care Management Note (Signed)
CARE MANAGEMENT NOTE 01/05/2014  Patient:  SHAKIYLA, WOODROME   Account Number:  [id number]  Date Initiated:  01/05/2014  Documentation initiated by:  Vance Peper  Subjective/Objective Assessment:   80 yr old female s/p  fall with left hip hemiarthroplasty . Patient is from home.     Action/Plan:   Plan is for patient to go to SNF for shorterm rehab, wants [facility]. Social worker is aware.   Anticipated DC Date:  01/06/2014   Anticipated DC Plan:  SKILLED NURSING FACILITY  In-house referral  Clinical Social Worker      DC Planning Services  CM consult      [facility] Choice  NA   Choice offered to / List presented to:     DME arranged  NA        HH arranged  NA      Status of service:  Completed, signed off Medicare Important Message given?   (If response is "NO", the following Medicare IM given date fields will be blank) Date Medicare IM given:   Date Additional Medicare IM given:    Discharge Disposition:  SKILLED NURSING FACILITY  Per UR Regulation:  Reviewed for med. necessity/level of care/duration of stay  If discussed at Long Length of Stay Meetings, dates discussed:    Comments:

## 2014-01-05 NOTE — Clinical Social Work Psychosocial (Signed)
Clinical Social Work Department  BRIEF PSYCHOSOCIAL ASSESSMENT  Patient: Pamela [name] Account Number: [id number]  Admit date: 01/03/14 Clinical Social Worker Rhea Pink, MSW Date/Time: 01/05/2014 10:30 AM Referred by: Physician Date Referred: 01/05/2014 Referred for   SNF Placement   Other Referral:  Interview type: Patient and patient's family Other interview type: PSYCHOSOCIAL DATA  Living Status:Alone at home Admitted from facility:  Level of care:  Primary support name: Pamela [name] Primary support relationship to patient: Son Degree of support available:  Strong and vested  CURRENT CONCERNS  Current Concerns   Post-Acute Placement   Other Concerns:  SOCIAL WORK ASSESSMENT / PLAN  CSW met with pt and patient's family at bedside. Patient reported that she fell trimming her hedges. Patient os from [facility] and wants to go to [facility]. Patient was very delightful and was very appreciative of SW visitre: PT recommendation for SNF.   Pt lives alone  CSW explained placement process and answered questions.   Pt reports [facility] as her preference    CSW completed FL2 and initiated SNF search.     Assessment/plan status: Information/Referral to [employer]  Other assessment/ plan:  Information/referral to community resources:  SNF   PTAR  PATIENT'S/FAMILY'S RESPONSE TO PLAN OF CARE:  Pt  reports she is agreeable to ST SNF in order to increase strength and independence with mobility prior to returning home  Pt verbalized understanding of placement process and appreciation for CSW assist.   Rhea Pink, MSW, [place]

## 2014-01-06 ENCOUNTER — Inpatient Hospital Stay
Admission: RE | Admit: 2014-01-06 | Discharge: 2014-02-27 | Disposition: A | Discharge status: Home / Self Care | Payer: Medicare Other | Source: Ambulatory Visit | Attending: Internal Medicine | Admitting: Internal Medicine

## 2014-01-06 DIAGNOSIS — D509 Iron deficiency anemia, unspecified: Secondary | ICD-10-CM

## 2014-01-06 DIAGNOSIS — D696 Thrombocytopenia, unspecified: Secondary | ICD-10-CM

## 2014-01-06 LAB — BASIC METABOLIC PANEL
BUN: 15 mg/dL (ref 6–23)
CHLORIDE: 103 meq/L (ref 96–112)
CO2: 27 mEq/L (ref 19–32)
Calcium: 8.5 mg/dL (ref 8.4–10.5)
Creatinine, Ser: 0.67 mg/dL (ref 0.50–1.10)
GFR calc Af Amer: 86 mL/min — ABNORMAL LOW (ref >90)
GFR calc non Af Amer: 75 mL/min — ABNORMAL LOW (ref >90)
GLUCOSE: 124 mg/dL — AB (ref 70–99)
Potassium: 4 mEq/L (ref 3.7–5.3)
SODIUM: 139 meq/L (ref 137–147)

## 2014-01-06 LAB — CBC
HCT: 27.6 % — ABNORMAL LOW (ref 36.0–46.0)
Hemoglobin: 8.8 g/dL — ABNORMAL LOW (ref 12.0–15.0)
MCH: 29.7 pg (ref 26.0–34.0)
MCHC: 31.9 g/dL (ref 30.0–36.0)
MCV: 93.2 fL (ref 78.0–100.0)
PLATELETS: 106 10*3/uL — AB (ref 150–400)
RBC: 2.96 MIL/uL — AB (ref 3.87–5.11)
RDW: 14.3 % (ref 11.5–15.5)
WBC: 6.5 10*3/uL (ref 4.0–10.5)

## 2014-01-06 MED ORDER — DSS 100 MG PO CAPS: 100.0000 mg | ORAL_CAPSULE | Freq: Two times a day (BID) | ORAL | Status: DC

## 2014-01-06 MED ORDER — BISACODYL 10 MG RE SUPP
10.0000 mg | Freq: Once | RECTAL | Status: AC
  Administered 2014-01-06: 10 mg via RECTAL
  Filled 2014-01-06: qty 1

## 2014-01-06 NOTE — Progress Notes (Signed)
Clinical social worker assisted with patient discharge to skilled nursing facility, [facility].  CSW addressed all family questions and concerns. CSW copied chart and added all important documents. CSW also set up patient transportation with [facility]. Clinical Social Worker will sign off for now as social work intervention is no longer needed.   Calixto Pavel, MSW, LCSWA [phone] 

## 2014-01-06 NOTE — Discharge Instructions (Signed)
Partial weight bearing until further directed Keep wound dry until office follow up

## 2014-01-06 NOTE — Discharge Summary (Addendum)
Physician Discharge Summary  TEXAS ZOGG FFM:[id number] DOB: 1920/12/15 DOA: 01/03/2014  PCP: Kirk Ruths, MD  Admit date: 01/03/2014 Discharge date: 01/06/2014  Time spent: 35 minutes  Recommendations for Outpatient Follow-up:  Discharge to SNF Please check platelets in 2-3 days  Recommendations from ortho 50% WB on the left leg  ASA 325 mg bid for 4 weeks for anticoagulation, unless other medically indicated.  Norco for pain management  Iron 325 mg tid for 2-3 weeks  Dressing to remain in place until follow up  in clinic in 2 weeks. Follow up in 2 weeks at [facility]. Dressing is waterproof and may shower with it in place.     Discharge Diagnoses:  Principal Problem:   Hip fracture, left   Active Problems:   Status post placement of cardiac pacemaker. 08/29/12 St. Jude device   HTN (hypertension)   Osteoporosis, unspecified   Cardiac dysrhythmia, unspecified   Chronic ischemic heart disease, unspecified   Atherosclerotic cardiovascular disease   QT prolongation   Thrombocytopenia, unspecified   Anemia, iron deficiency   Discharge Condition: fair  Diet recommendation: regular  Filed Weights   01/03/14 1308  Weight: 70.308 kg (155 lb)    History of present illness:  Please refer to admission H&P for details, but in brief, 80 year old from a functional female with history of hypertension, status post pacemaker for sinus bradycardia, history of ICD for sick sinus syndrome, hypertension, hyperlipidemia, CAD with ischemic heart disease presented with a mechanical fall at home and landed on her left hip.  In the ED x-ray showed acute left femoral neck fracture. Patient taken to OR on 6/7 with left hip hemiarthroplasty and tolerated surgery well.   Hospital Course:  Hip fracture, left  Secondary to mechanical fall.  Status post left hip hemiarthroplasty on 6/7. Tolerated surgery well. Pain control Vicodin when necessary and Robaxin for muscle  spasms. Aspirin 325 mg twice a day for DVT prophylaxis. Foley removed.  -colace bid.  PT eval recommends skilled nursing facility. Discharge to [facility] at [place].   Active Problems:  Status post placement of cardiac pacemaker. 08/29/12 St. Jude device  Stable.   HTN (hypertension)  stable. Continue home medications   Osteoporosis, unspecified  On Tylenol. D/c NSAIDs given thrombocytopenia  Cardiac dysrhythmia,  Stable. Has pacemaker   Chronic ischemic heart disease, unspecified  Continue aspirin, Zetia, Lasix, ramipril and bystolic . Resume baby ASA once off full dose   mild QT prolongation on EKG  Stable on telemetry.   Thrombocytopenia No clear etiology. Platelets have dropped to 106 this am. No signs of bleeding. Since she is being discharged on ASA 325 mg bid, Platelets need to be monitored closely.  Anemia  iron def and post op blood loss. On iron supplements for 2-3 weeks. Monitor hb as outpt   Patent stable for discharge to SNF with outpt ortho follow up  Code Status: full  Family Communication: daughter at bedside  Disposition Plan: Skilled nursing facility   Consultants:  Ortho   Procedures:  Left hip hemiarthroplasty on 6/ 7   Antibiotics:  none   Discharge Exam: Filed Vitals:   01/06/14 1032  BP: 112/46  Pulse:   Temp:   Resp:     General: Elderly female in no acute distress  HEENT: No pallor, muscle to mucosa  Chest: Clear to auscultation bilaterally, no added sound  Cardiovascular: Normal S1 and S2, no murmurs or gallop  Abdomen: , Nontender, nondistended, bowel sounds present  Musculoskeletal: Dressing  over left hip intact.  CNS: AAOX3  Discharge Instructions You were cared for by a hospitalist during your hospital stay. If you have any questions about your discharge medications or the care you received while you were in the hospital after you are discharged, you can call the unit and asked to speak with the hospitalist on call  if the hospitalist that took care of you is not available. Once you are discharged, your primary care physician will handle any further medical issues. Please note that NO REFILLS for any discharge medications will be authorized once you are discharged, as it is imperative that you return to your primary care physician (or establish a relationship with a primary care physician if you do not have one) for your aftercare needs so that they can reassess your need for medications and monitor your lab values.  Discharge Instructions   Partial weight bearing    Complete by:  As directed   50%            Medication List    STOP taking these medications       ibuprofen 200 MG tablet  Commonly known as:  ADVIL,MOTRIN       meloxicam 15 MG tablet  Commonly known as:  MOBIC      TAKE these medications       acetaminophen 325 MG tablet  Commonly known as:  TYLENOL  Take 650 mg by mouth every 6 (six) hours as needed for pain.     aspirin EC 325 MG tablet  Take 1 tablet (325 mg total) by mouth 2 (two) times daily. Take for 4 weeks     atorvastatin 10 MG tablet  Commonly known as:  LIPITOR  Take 1 tablet (10 mg total) by mouth daily.     DSS 100 MG Caps  Take 100 mg by mouth 2 (two) times daily.     ezetimibe 10 MG tablet  Commonly known as:  ZETIA  Take 10 mg by mouth daily.     ferrous sulfate 325 (65 FE) MG tablet  Take 325 mg by mouth every other day.     furosemide 20 MG tablet  Commonly known as:  LASIX  Take 20 mg by mouth daily as needed for edema.     HYDROcodone-acetaminophen 5-325 MG per tablet  Commonly known as:  NORCO  Take 1-2 tablets by mouth every 6 (six) hours as needed.     l-methylfolate-B6-B12 3-35-2 MG Tabs  Commonly known as:  METANX  Take 1 tablet by mouth 2 (two) times daily.              nebivolol 5 MG tablet  Commonly known as:  BYSTOLIC  Take 5 mg by mouth daily.     OCUVITE PO  Take 1 tablet by mouth 2 (two) times daily.     ramipril  2.5 MG capsule  Commonly known as:  ALTACE  Take 2.5 mg by mouth daily.     SYSTANE OP  Apply 1 drop to eye 2 (two) times daily as needed (Dry Eyes).     VITAMIN D-3 PO  Take 1 capsule by mouth daily.       No Known Allergies     Follow-up Information   Follow up with Shelda Pal, MD. Schedule an appointment as soon as possible for a visit in 2 weeks.   Specialty:  Orthopedic Surgery   Contact information:   [address] [place] [address] [phone]  The results of significant diagnostics from this hospitalization (including imaging, microbiology, ancillary and laboratory) are listed below for reference.    Significant Diagnostic Studies: Dg Chest 1 View  01/03/2014   CLINICAL DATA:  Left hip fracture.  Pre-op respiratory exam  EXAM: CHEST - 1 VIEW  COMPARISON:  04/08/2013  FINDINGS: Mild cardiomegaly stable. Dual lead transvenous pacemaker remains in appropriate position. Mild scarring again seen in the left lung base. No evidence of pulmonary infiltrate or edema. No evidence of pleural effusion. No mass or lymphadenopathy identified.  IMPRESSION: Stable mild cardiomegaly and left basilar scarring. No active disease.   Electronically Signed   By: Myles RosenthalJohn  Stahl M.D.   On: 01/03/2014 15:00   Dg Hip Complete Left  01/03/2014   CLINICAL DATA:  Fall, left hip pain and deformity  EXAM: LEFT HIP - COMPLETE 2+ VIEW  COMPARISON:  None.  FINDINGS: Acute left femoral neck fracture with significant foreshortening of the femoral neck. The anatomy appears most consistent with a transcervical fracture pattern. The femoral head remains located. The patient is rotated toward the left. The visualized bony pelvis remains intact. The bones are osteopenic. Degenerative changes noted throughout the lumbar spine. Unremarkable bowel gas pattern.  IMPRESSION: Acute left femoral neck fracture. Given the leftward rotation, this appears to represent a transcervical femoral neck  fracture. A high intertrochanteric pattern is also possible. There is associated significant foreshortening of the femoral neck.   Electronically Signed   By: Malachy MoanHeath  McCullough M.D.   On: 01/03/2014 15:00   Dg Pelvis Portable  01/04/2014   CLINICAL DATA:  Status post left hip arthroplasty.  EXAM: PORTABLE PELVIS 1-2 VIEWS  COMPARISON:  01/03/2014.  FINDINGS: Single view of the pelvis demonstrates only of the lower pelvis and upper femurs. Compared to the prior examination there are postoperative changes of left hip bipolar hemiarthroplasty. The prosthetic femoral head projects within the acetabulum on this single view examination. There is gas within the joint space and the adjacent soft tissues. Femoral stem appears well seated within the proximal femoral diaphysis without periprosthetic fracture or other immediate complicating features. Visualized portions of the lower pelvis and right proximal femur are unremarkable.  IMPRESSION: 1. Expected postoperative changes of left hip bipolar hemiarthroplasty without acute complicating features, as above.   Electronically Signed   By: Trudie Reedaniel  Entrikin M.D.   On: 01/04/2014 13:58    Microbiology: Recent Results (from the past 240 hour(s))  MRSA PCR SCREENING     Status: None   Collection Time    01/04/14  6:31 AM      Result Value Ref Range Status   MRSA by PCR NEGATIVE  NEGATIVE Final   Comment:            The GeneXpert MRSA Assay (FDA     approved for NASAL specimens     only), is one component of a     comprehensive MRSA colonization     surveillance program. It is not     intended to diagnose MRSA     infection nor to guide or     monitor treatment for     MRSA infections.     Labs: Basic Metabolic Panel:  Recent Labs Lab 01/03/14 1500 01/05/14 0446 01/06/14 0505  NA 144 141 139  K 3.9 4.2 4.0  CL 106 105 103  CO2 25 28 27   GLUCOSE 110* 145* 124*  BUN 22 14 15   CREATININE 0.76 0.67 0.67  CALCIUM 9.8 8.5 8.5  Liver Function  Tests:  Recent Labs Lab 01/03/14 1500  AST 25  ALT 17  ALKPHOS 49  BILITOT 0.4  PROT 6.4  ALBUMIN 4.0   No results found for this basename: LIPASE, AMYLASE,  in the last 168 hours No results found for this basename: AMMONIA,  in the last 168 hours CBC:  Recent Labs Lab 01/03/14 1500 01/05/14 0446 01/06/14 0505  WBC 7.7 5.6 6.5  HGB 11.3* 10.0* 8.8*  HCT 36.1 32.6* 27.6*  MCV 92.8 94.2 93.2  PLT 149* 124* 106*   Cardiac Enzymes: No results found for this basename: CKTOTAL, CKMB, CKMBINDEX, TROPONINI,  in the last 168 hours BNP: BNP (last 3 results)  Recent Labs  04/08/13 1334  PROBNP 533.7*   CBG: No results found for this basename: GLUCAP,  in the last 168 hours     Signed:  Kiora Hallberg  [facility] 01/06/2014, 11:35 AM

## 2014-01-06 NOTE — Progress Notes (Signed)
Patient d/c to SNF, IV removed, report called to SNF. 

## 2014-01-06 NOTE — Progress Notes (Signed)
Physical Therapy Treatment Patient Details Name: Rosanne Sackreva N Kozlowski MRN: [id number] DOB: 01-Jun-1921 Today's Date: 01/06/2014    History of Present Illness 80 y.o. female s/p left hip hemiarthroplasty after sustaning a femoral neck fracture with mechanical fall.    PT Comments    Patient progressing well this morning. Able to increase ambulation slowly. Continue with current POC  Follow Up Recommendations  SNF;Supervision for mobility/OOB     Equipment Recommendations  3in1 (PT)    Recommendations for Other Services       Precautions / Restrictions Precautions Precautions: Posterior Hip Precaution Comments: Reviewed precautions and WB status Restrictions LLE Weight Bearing: Partial weight bearing LLE Partial Weight Bearing Percentage or Pounds: 50    Mobility  Bed Mobility   Bed Mobility: Supine to Sit     Supine to sit: Min assist;HOB elevated     General bed mobility comments: Min A for trunk support and LLE to edge of bed. Cues for positioning and use of rails.   Transfers Overall transfer level: Needs assistance Equipment used: Rolling walker (2 wheeled) Transfers: Sit to/from Stand Sit to Stand: Min assist         General transfer comment: Min A to power up into standing. Cues for safe technique and LLE positioning. Cues to back all the way back to recliner and not sit too fast  Ambulation/Gait Ambulation/Gait assistance: Min assist Ambulation Distance (Feet): 20 Feet Assistive device: Rolling walker (2 wheeled) Gait Pattern/deviations: Step-to pattern;Decreased step length - right;Decreased step length - left   Gait velocity interpretation: <1.8 ft/sec, indicative of risk for recurrent falls General Gait Details: Min A for balance and use of RW. Cues to increase step length and look forward. Patient to walk to couch and back in the room   Stairs            Wheelchair Mobility    Modified Rankin (Stroke Patients Only)       Balance                                     Cognition Arousal/Alertness: Awake/alert Behavior During Therapy: WFL for tasks assessed/performed Overall Cognitive Status: Within Functional Limits for tasks assessed                      Exercises Total Joint Exercises Ankle Circles/Pumps: AROM;Both;10 reps Quad Sets: AROM;Both;10 reps Gluteal Sets: AROM;Both;10 reps Heel Slides: AAROM;Left;10 reps Hip ABduction/ADduction: AAROM;Left;10 reps    General Comments        Pertinent Vitals/Pain no apparent distress     Home Living                      Prior Function            PT Goals (current goals can now be found in the care plan section) Progress towards PT goals: Progressing toward goals    Frequency  Min 5X/week    PT Plan Current plan remains appropriate    Co-evaluation             End of Session Equipment Utilized During Treatment: Gait belt Activity Tolerance: Patient tolerated treatment well Patient left: in chair;with call bell/phone within reach;with family/visitor present     Time: 0912-0939 PT Time Calculation (min): 27 min  Charges:  $Gait Training: 8-22 mins $Therapeutic Exercise: 8-22 mins  G Codes:      Adline Potter Nadine Ryle 01/06/2014, 10:01 AM 01/06/2014 Adline Potter Antanasia Kaczynski PTA [phone] pager [phone] office

## 2014-01-07 ENCOUNTER — Non-Acute Institutional Stay (SKILLED_NURSING_FACILITY): Payer: Medicare Other | Admitting: Internal Medicine

## 2014-01-07 DIAGNOSIS — D508 Other iron deficiency anemias: Secondary | ICD-10-CM

## 2014-01-07 DIAGNOSIS — D696 Thrombocytopenia, unspecified: Secondary | ICD-10-CM

## 2014-01-07 DIAGNOSIS — M810 Age-related osteoporosis without current pathological fracture: Secondary | ICD-10-CM

## 2014-01-07 DIAGNOSIS — S72142A Displaced intertrochanteric fracture of left femur, initial encounter for closed fracture: Secondary | ICD-10-CM

## 2014-01-07 DIAGNOSIS — S72143A Displaced intertrochanteric fracture of unspecified femur, initial encounter for closed fracture: Secondary | ICD-10-CM

## 2014-01-07 NOTE — Progress Notes (Signed)
Patient ID: Pamela [name], female   DOB: Jun 22, 1921, 80 y.o.   MRN: [id number]  Facility; [facility] Chief complaint; [facility] to SNF [facility]6 to 6/9 2015  History; this is an independent 80 year old woman who lives [place] own [place]. She fell while working on yardwork at her home. She suffered a left hip fracture. She underwent a left hip hemiarthroplasty on 01/04/2014. The patient denies a history of falls or osteoporosis. Shetells me that she has had a bone density test and as far she is aware it was normal. She does not use ambulatory assist devices still drives. There does not appear to have been any major problems related to the surgery itself although she had a drop in her platelet count down to 106 on the day of discharge. The cause of this was not clear to her discharging physician. She was started on iron.    Past Medical History  Diagnosis Date  . Pacemaker 08/29/2012    st jude accent DR RF device, model number [id number], serial number [id number], implanted 08/29/2012 for sinus bradycardia, runs of supraventricular tachycardia  last checked 10/23/2012  . ICD (implantable cardiac defibrillator) in place   . SSS (sick sinus syndrome) with PAT and marked bradycardia with 3 sec pauses  08/30/2012  . Status post placement of cardiac pacemaker. 08/29/12 St. Jude device 08/30/2012  . HTN (hypertension) 08/30/2012  . Hyperlipidemia 08/30/2012  . Polyneuropathy in other diseases classified elsewhere   . Macular degeneration   . Coronary artery disease     stent in the LAD artery in 2002  . Venous insufficiency    Past Surgical History  Procedure Laterality Date  . Cardiac catheterization  2006    3 stents placed  . Insert / replace / remove pacemaker  08/29/2012    st jude   . Rotator cuff repair Left   . Abdominal hysterectomy    . Cesarean section    . Kidney stone surgery    . Rotator cuff repair Right   . Cardiac catheterization  06/2001    placement  of BiodivYsio 2.5.10mm stent dilated to 2.2775mm in proximal left anterior descenting stenotic lesion  . Radiofrequency ablation  08/25/2010    ablation of left greater saphenous vein  . Hip arthroplasty Left 01/04/2014    Procedure: ARTHROPLASTY BIPOLAR HIP;  Surgeon: Shelda PalMatthew D Olin, MD;  [facility];  Service: Orthopedics;  Laterality: Left;   Current Outpatient Prescriptions on File Prior to Visit  Medication Sig Dispense Refill  . acetaminophen (TYLENOL) 325 MG tablet Take 650 mg by mouth every 6 (six) hours as needed for pain.      Marland Kitchen. aspirin EC 325 MG tablet Take 1 tablet (325 mg total) by mouth 2 (two) times daily. Take for 4 weeks  60 tablet  0  . atorvastatin (LIPITOR) 10 MG tablet Take 1 tablet (10 mg total) by mouth daily.  30 tablet  6  . Cholecalciferol (VITAMIN D-3 PO) Take 1 capsule by mouth daily.      Marland Kitchen. docusate sodium 100 MG CAPS Take 100 mg by mouth 2 (two) times daily.  10 capsule  0  . ezetimibe (ZETIA) 10 MG tablet Take 10 mg by mouth daily.      . ferrous sulfate 325 (65 FE) MG tablet Take 325 mg by mouth every other day.       . furosemide (LASIX) 20 MG tablet Take 20 mg by mouth daily as needed  for edema.      Marland Kitchen HYDROcodone-acetaminophen (NORCO) 5-325 MG per tablet Take 1-2 tablets by mouth every 6 (six) hours as needed.  90 tablet  0  . l-methylfolate-B6-B12 (METANX) 3-35-2 MG TABS Take 1 tablet by mouth 2 (two) times daily.  60 tablet  0  . Multiple Vitamins-Minerals (OCUVITE PO) Take 1 tablet by mouth 2 (two) times daily.      . nebivolol (BYSTOLIC) 5 MG tablet Take 5 mg by mouth daily.      Bertram Gala Glycol-Propyl Glycol (SYSTANE OP) Apply 1 drop to eye 2 (two) times daily as needed (Dry Eyes).       . ramipril (ALTACE) 2.5 MG capsule Take 2.5 mg by mouth daily.           Social; the patient tells me that she lives in her in single-story home. She does not use ambulatory assist devices. She has no history of falls  reports that she has never smoked. She does  not have any smokeless tobacco history on file. She reports that she does not drink alcohol or use illicit drugs.  Fam HX; indicated that her mother is deceased. She indicated that her father is deceased. She indicated that only one of her two sisters is alive. She indicated that her brother is deceased.   Review of systems Respiratory no shortness of breath Cardiac no chest pain GI no abdominal pain or constipation GU no voiding difficulties. Musculoskeletal she tells me she has had previous surgery on her right shoulder although she has good range here and no significant pain  Physical examination Gen. remarkably well looking 80 year old woman bright and alert Vitals; O2 sat is 93% on room air, pulse 83 and regular respirations 18 and unlabored Respiratory clear entry bilaterally Cardiac heart sounds are normal there is no murmurs gallops or bruits Abdomen no liver no spleen no tenderness no masses Lymph none palpable in the cervical clavicular or axillary areas GU bladder is not distended  Musculoskeletal left hip incision looks very stable well approximated no evidence of infection. She has good range of motion at the right shoulder which is her previously surgical shoulder Skin she has very superficial wounds on the right greater than left ischial areas these look like shearing injury however will need careful followup  Impression/plan Left hip fracture status post left hip hemiarthroplasty. She seems to have done well. No previously known gait difficulties Thrombocytopenia; see values below. The cause of this was not clear perhaps a perioperative medication Patient is on aspirin 325 twice a day for DVT prophylaxis; although I see this frequently I am not certain of the exact utility, this will need to be carefully followed Status post pacemaker implant in January 2014 this is stable Hypertension we'll monitor while she is here Osteoporosis; the patient actually denied this History  of chronic ischemic heart disease she is on aspirin, said he, REM apparel and by systolic. Instructions to resume enteric-coated aspirin 81 once the full dose aspirin has been discontinued Blood loss anemia on iron  I will followup on the platelet count issues. Hopefully this will not continue to fall. She appears to be an excellent candidate for rehabilitation although it is certainly not for certain that she will or return to her premorbid functional level. I discussed the implications of this with the patient   .  Ref. Range 04/08/2013 13:34 08/13/2013 12:01 01/03/2014 15:00 01/05/2014 04:46 01/06/2014 05:05  WBC Latest Range: 4.0-10.5 K/uL 5.0 4.3 7.7 5.6 6.5  RBC Latest Range: 3.87-5.11 MIL/uL 4.61 4.18 3.89 3.46 (L) 2.96 (L)  Hemoglobin Latest Range: 12.0-15.0 g/dL 16.1 09.6 04.5 (L) 40.9 (L) 8.8 (L)  HCT Latest Range: 36.0-46.0 % 43.4 37.5 36.1 32.6 (L) 27.6 (L)  MCV Latest Range: 78.0-100.0 fL 94.1 89.7 92.8 94.2 93.2  MCH Latest Range: 26.0-34.0 pg 30.2 29.4 29.0 28.9 29.7  MCHC Latest Range: 30.0-36.0 g/dL 81.1 91.4 78.2 95.6 21.3  RDW Latest Range: 11.5-15.5 % 13.5 13.8 13.9 14.2 14.3  Platelets Latest Range: 150-400 K/uL 159 175 149 (L) 124 (L) 106 (L)

## 2014-01-22 ENCOUNTER — Non-Acute Institutional Stay (SKILLED_NURSING_FACILITY): Payer: Medicare Other | Admitting: Internal Medicine

## 2014-01-22 DIAGNOSIS — D509 Iron deficiency anemia, unspecified: Secondary | ICD-10-CM

## 2014-01-22 DIAGNOSIS — S72009S Fracture of unspecified part of neck of unspecified femur, sequela: Secondary | ICD-10-CM

## 2014-01-22 DIAGNOSIS — S72002S Fracture of unspecified part of neck of left femur, sequela: Secondary | ICD-10-CM

## 2014-01-24 ENCOUNTER — Encounter: Payer: Self-pay | Admitting: Internal Medicine

## 2014-01-24 NOTE — Progress Notes (Signed)
Patient ID: Pamela [name], female   DOB: [birth year], 80 y.o.   MRN: [id number]   This is an acute visit.  Level of care skilled.  [facility]  Chief complaint- Acute visit secondary to anemia.  History of present illness.  Patient is a very pleasant 80 year old female who is here for rehabilitation after sustaining a left hip fracture after a fall back this was subsequently repaired fairly uneventfully.  She does have a diagnosis of postop anemia and is on iron once a day.  On admission it appears her hemoglobin was around 13 after surgery did drop discharge hemoglobin was in the high eights-we have been monitoring her hemoglobin here back on June 17 it was 8.9-on the 22nd it is 8.7.  She does not report any previous history of GI bleeding--states it's been years since she's had a colonoscopy.  Family medical social history as been reviewed her admission note on 01/07/2014  [facility]  Review of systems.  Gen. no complaints of fever or chills.  Respiratory no complaints of increased shortness of breath or cough.  Cardiac no chest pain does have a history of coronary artery disease and does have a pacemaker inserted.  GI does not complaining of any abdominal discomfort reports a good appetite no nausea or vomiting.  Muscle skeletal any hip pain appears well controlled she is status post left hip repair after fracture.  Neurologic does not complaining of any headache numbness or syncopal-type feelings.  Psych does not complaining of depression or anxiety.  Physical exam.  Temperature is 98.1 pulse 67 respirations 20 blood pressure 118/58.  General this is a very pleasant elderly female who looks younger than her stated age.  Her skin is warm and dry.  Eyes pupils appear reactive to light bilaterally visual acuity appears intact she has prescription lenses.  Oropharynx is clear mucous membranes moist.  Chest is clear to auscultation  there is no labored breathing.  Heart is regular rate and rhythm without murmur gallop or rub she has some mild lower extremity edema she says this is improving status post surgery.  Abdomen is soft nontender with positive bowel sounds.  Rectal exam was performed stool sample is guaiac negative.  Muscle skeletal does move all extremities x4 with limited motion of her left leg secondary to the recent surgery I do not note any deformities.  Neurologic is grossly intact no lateralizing findings her speech is clear.  Psych she is alert and oriented x3 pleasant and appropriate.  LABS  01/19/2014.  WBC 4.3 hemoglobin 8.7 platelets 256.  01/14/2014.  WBC 5.0 hemoglobin 8.9 platelets 217.  Apparently in the hospital her platelets were down to 106.  Assessment and plan.  #1-anemia thought to be blood loss anemia from surgery she is on iron once a day will increase this to twice a day.  Also will order to guaiac stools x3 the guaiac was negative tonight.  Also update lab work next week including a CBC as well as iron studies B12 folate ferritin and reticulocyte count  Of note her thrombocytopenia appears to have normalized.  #2-history of left hip fracture with repair-she appears to be doing well-- she is on aspirin twice a day for anticoagulation at this point we'll monitor    [id number]-of note greater than 25 minutes spent assessing patient-reviewing her medical records-discussing her status with nursing staff-and coordinating and formulating a plan of care-of note greater than 50% of time spent coordinating plan of care.   .Marland Kitchen

## 2014-01-29 ENCOUNTER — Other Ambulatory Visit: Payer: Self-pay

## 2014-01-29 MED ORDER — HYDROCODONE-ACETAMINOPHEN 5-325 MG PO TABS: ORAL_TABLET | ORAL | Status: DC

## 2014-01-29 NOTE — Telephone Encounter (Signed)
RX faxed to [facility] @ [phone]. Phone number [phone]  

## 2014-01-30 ENCOUNTER — Other Ambulatory Visit (HOSPITAL_BASED_OUTPATIENT_CLINIC_OR_DEPARTMENT_OTHER): Payer: Self-pay | Admitting: Internal Medicine

## 2014-01-30 ENCOUNTER — Non-Acute Institutional Stay (SKILLED_NURSING_FACILITY): Payer: Medicare Other | Admitting: Internal Medicine

## 2014-01-30 ENCOUNTER — Ambulatory Visit (HOSPITAL_COMMUNITY)
Admission: RE | Admit: 2014-01-30 | Discharge: 2014-01-30 | Disposition: A | Discharge status: Home / Self Care | Payer: Medicare Other | Source: Ambulatory Visit | Attending: Internal Medicine | Admitting: Internal Medicine

## 2014-01-30 ENCOUNTER — Encounter: Payer: Self-pay | Admitting: Internal Medicine

## 2014-01-30 DIAGNOSIS — M79605 Pain in left leg: Secondary | ICD-10-CM

## 2014-01-30 DIAGNOSIS — I82409 Acute embolism and thrombosis of unspecified deep veins of unspecified lower extremity: Secondary | ICD-10-CM

## 2014-01-30 DIAGNOSIS — M7989 Other specified soft tissue disorders: Secondary | ICD-10-CM

## 2014-01-30 DIAGNOSIS — I82402 Acute embolism and thrombosis of unspecified deep veins of left lower extremity: Secondary | ICD-10-CM

## 2014-01-30 DIAGNOSIS — M79609 Pain in unspecified limb: Secondary | ICD-10-CM | POA: Insufficient documentation

## 2014-01-30 DIAGNOSIS — R195 Other fecal abnormalities: Secondary | ICD-10-CM | POA: Insufficient documentation

## 2014-01-30 DIAGNOSIS — D509 Iron deficiency anemia, unspecified: Secondary | ICD-10-CM

## 2014-01-30 NOTE — Progress Notes (Signed)
Patient ID: Rosanne Sackreva N Summer, female   DOB: [birth year], 80 y.o.   MRN: [id number]   this is an acute visit.  Level of care skilled.  [facility]  Chief complaint  Acute visit secondary to left leg edema with new diagnosis of DVT.  History of present illness.  Patient is a very pleasant 80 year old female who sustained a left hip fracture that was surgically repaired-she has been quite stable however last couple days noticed some increased left leg edema from baseline she also says she's having some pain here.  I assess it earlier today and a Doppler ultrasound had been ordered-this has come back positive for a DVT in the popliteal and calf veins-she has been on aspirin for prophylaxis.  She does not complaining of any shortness of breath or chest pain.  This is complicated somewhat with a history of some occult positive stool-this was ordered because her hemoglobin was somewhat low although she has been put on iron and it is rising again this initially was thought to be postop blood loss anemia and hemoglobin actually has risen since her surgery this appears to be stable.  One would wonder if possibly this a occult bleeding was caused by irritation from aspirin  He denies any previous GI history of rectal bleeding although she says she has not had a colonoscopy in quite a while.  Her vital signs continued to be stable.  Family medical social history as been reviewed per admission note on 01/07/2014.  Medications have been reviewed per [facility].  Review of systems.  General no complaints of fever or chills.  Skin does not complaining of any rashes or itching does appear to have some venous insufficiency lower extremities bilaterally.  Head ears eyes nose mouth and throat no visual changes or sore throat.  Respiratory he is not complaining of any shortness of breath or cough.  Cardiac no chest pain again does have newly diagnosed DVT left lower leg.  GI is not complaining of  abdominal discomfort nausea or vomiting.  Muscle skeletal is complaining of some left leg pain this apparently is relieved with the Norco.  Neurologic does not complaining of any headache or syncopal type episodes says occasionally she'll have some numbness of her lower extremities.  Psych no complaint of depression or anxiety although appears a little bit anxious today.  Physical exam.  Temperature is 97.6 pulse 65 respirations 20 blood pressure 143/67-126/72-in this range weight has been stable around 166.  In general this is a elderly female who looks younger than her stated age.  Her skin is warm and dry.  She does appear to have some venous stasis changes lower extremities bilaterally.  Oropharynx clear mucous membranes moist.  Chest is clear to auscultation there is no labored breathing.  Heart is regular rate and rhythm without murmur gallop or rub she does have somewhat increased left lower extremity edema compared to her right there is a reduced pedal pulse there is some firmness to the calf area with some mild tenderness to palpation she also has some venous stasis changes-the leg is cool to touch.  I would say her edema is 2+ on the left 1+ on the right.    Abdomen soft nontender with active bowel sounds.  Muscle skeletal is able to move all extremities x4-she does have limited movement of her left leg secondary to recent surgery but is able to move this with appropriate postop movement-.  Neurologic is grossly intact touch sensation of the legs is  intact  Psych she is alert and oriented pleasant and appropriate.  Labs.  01/26/2014.  WBC 3.3 hemoglobin 9.5 platelets 237.  Sodium 145 potassium 3.8 BUN 16 creatinine 0.68.  Iron studies showed an iron of 28 total iron-binding capacity of 260 folate was greater than 20 ferritin 110 vitamin B12 greater than 2000  Assessment and plan.  #1-left leg DVT--new diagnosis- as noted above-this was discussed with Dr.  Leanord Hawkingobson via phone-we will discontinue the aspirin and start Xarelto 15 mg twice a day for 21 days and then change to 20 mg a day thereafter.  Also will need bed rest for  24 hours-.  #2-history of occult positive stool again this complicates matters however her hemoglobin actually is rising-Will put her on a proton pump inhibitor Prilosec 20 mg a day for protective effect and CBC next week to make sure she is stable--this plan was discussed with Dr. Leanord Hawkingobson via phone.  She also is on iron twice a day again hemoglobin is moving up which is encouraging  [id number]-of note greater than 35 minutes spent assessing patient-reviewing her chart-discussing her status with nursingandn also coordinating plan of care with Dr. Leanord Hawkingobson via phone-of note greater than 50% of time spent coordinating plan of care    .

## 2014-02-03 ENCOUNTER — Non-Acute Institutional Stay (SKILLED_NURSING_FACILITY): Payer: Medicare Other | Admitting: Internal Medicine

## 2014-02-03 DIAGNOSIS — D509 Iron deficiency anemia, unspecified: Secondary | ICD-10-CM

## 2014-02-03 DIAGNOSIS — I82409 Acute embolism and thrombosis of unspecified deep veins of unspecified lower extremity: Secondary | ICD-10-CM

## 2014-02-03 DIAGNOSIS — I82402 Acute embolism and thrombosis of unspecified deep veins of left lower extremity: Secondary | ICD-10-CM

## 2014-02-03 NOTE — Progress Notes (Signed)
Patient ID: Pamela [name], female   DOB: 07-08-1921, 80 y.o.   MRN: [id number]   this is an acute visit.  Level of care skilled.  Facility [facility] .  Chief complaint  Acute visit followup left leg DVT   History of present illness.  Patient is a very pleasant 80 year old female who sustained a left hip fracture that was surgically repaired-she has been quite stable however lastweek noticed some increased left leg edema from baseline --also had some pain .  I venous Doppler was ordered which came back positive for a DVT in the popliteal and calf veins-she had been on aspirin for prophylaxis--this was switched to Xarelto 50 mg twice a day last Friday.  This is been complicated with some history of occult positive stools-she has been started on a proton pump inhibitor there is some thought possibly this was caused by GI irritation from the aspirin.  Her hemoglobin has remained stable-iron studies did show a low iron and possible element of chronic disease-an updated CBC has been ordered for this week.   .  She  denies any previous GI history of rectal bleeding although she says she has not had a colonoscopy in quite a while .  Her vital signs continued to be stable .  Family medical social history as been reviewed per admission note on 01/07/2014 .  Medications have been reviewed per [facility] .  Review of systems.  General no complaints of fever or chills.  Skin does not complaining of any rashes or itching does appear to have some venous insufficiency lower extremities bilaterally.  Marland Kitchen.  Respiratory he is not complaining of any shortness of breath or cough.  Cardiac no chest pain again does have newly diagnosed DVT left lower leg.  Edema of the left leg actually appears improved today compared to last week she also says it is less painful .  Physical exam.   . temperature 97.4 pulse 65 respirations 20 blood pressure variable 152/72-125/59-in this range recently  In general this is a elderly  female who looks younger than her stated age.  Her skin is warm and dry.  She does appear to have some venous stasis changes lower extremities bilaterally.  Oropharynx clear mucous membranes moist.  Chest is clear to auscultation there is no labored breathing.  Heart is regular rate and rhythm without murmur gallop or rub--she has baseline lower extremity edema although the edema the left leg appears improved slightly larger than the right leg but improved from last week--pedal pulses somewhat reduced  .   .  Labs.  01/26/2014.  WBC 3.3 hemoglobin 9.5 platelets 237.  Sodium 145 potassium 3.8 BUN 16 creatinine 0.68.  Iron studies showed an iron of 28 total iron-binding capacity of 260 folate was greater than 20 ferritin 110 vitamin B12 greater than 2000   Assessment and plan.  #1-left leg DVT-this appears to be stable pain and edema is reduced compared to last week she is starting therapy again-appears to be tolerating this well asked she continues on Xarelto  twice a day and eventually transitioned to daily in approximately 15 days.   .  #2-history of occult positive stool again this complicates matters however her hemoglobin actually is rising-update CBC has been ordered for this week she is on a proton pump inhibitor and aspirin has been discontinued.  [id number]     .

## 2014-02-13 NOTE — OR Nursing (Signed)
Late entry on type, subtype and infection for procedural recording

## 2014-02-16 ENCOUNTER — Ambulatory Visit (INDEPENDENT_AMBULATORY_CARE_PROVIDER_SITE_OTHER): Payer: Medicare Other | Admitting: Internal Medicine

## 2014-02-16 ENCOUNTER — Encounter: Payer: Self-pay | Admitting: Internal Medicine

## 2014-02-16 VITALS — BP 167/77 | HR 65 | Ht 66.0 in | Wt 132.4 lb

## 2014-02-16 DIAGNOSIS — I259 Chronic ischemic heart disease, unspecified: Secondary | ICD-10-CM

## 2014-02-16 DIAGNOSIS — I499 Cardiac arrhythmia, unspecified: Secondary | ICD-10-CM

## 2014-02-16 DIAGNOSIS — Z950 Presence of cardiac pacemaker: Secondary | ICD-10-CM

## 2014-02-16 DIAGNOSIS — I471 Supraventricular tachycardia: Secondary | ICD-10-CM

## 2014-02-16 LAB — MDC_IDC_ENUM_SESS_TYPE_INCLINIC
Battery Remaining Longevity: 141.6 mo
Battery Voltage: 3.01 V
Brady Statistic RA Percent Paced: 36 %
Brady Statistic RV Percent Paced: 0.33 %
Lead Channel Impedance Value: 412.5 Ohm
Lead Channel Pacing Threshold Pulse Width: 0.4 ms
Lead Channel Sensing Intrinsic Amplitude: 2.6 mV
Lead Channel Setting Pacing Amplitude: 0.875
Lead Channel Setting Pacing Amplitude: 2.375
Lead Channel Setting Pacing Pulse Width: 0.4 ms
MDC IDC MSMT LEADCHNL RA PACING THRESHOLD AMPLITUDE: 1.375 V
MDC IDC MSMT LEADCHNL RV IMPEDANCE VALUE: 400 Ohm
MDC IDC MSMT LEADCHNL RV PACING THRESHOLD AMPLITUDE: 0.625 V
MDC IDC MSMT LEADCHNL RV PACING THRESHOLD PULSEWIDTH: 0.4 ms
MDC IDC MSMT LEADCHNL RV SENSING INTR AMPL: 4.2 mV
MDC IDC PG SERIAL: 7437302
MDC IDC SESS DTM: 20150720101043
MDC IDC SET LEADCHNL RV SENSING SENSITIVITY: 2 mV

## 2014-02-16 NOTE — Progress Notes (Signed)
HPI Pamela [name] returns today for followup. She is a pleasant 80 yo woman with symptomatic tachybrady syndrome and pauses s/p PPM insertion. She fell several weeks ago and had a broken hip which was successfully repaired. She is at the [facility] undergoing rehab. She is improving. She denies chest pain or sob. She has had peripheral edema on the side of her broken hip since surgery. She has not had syncope.  No Known Allergies   Current Outpatient Prescriptions  Medication Sig Dispense Refill  . HYDROcodone-acetaminophen (NORCO) 5-325 MG per tablet 1-2 by mouth every 4 hours as needed for moderate -severe pain DO NOT EXCEED 3 GM OF APAP FROM ALL SOURCES  180 tablet  0   No current facility-administered medications for this visit.     Past Medical History  Diagnosis Date  . Pacemaker 08/29/2012    st jude accent DR RF device, model number [id number], serial number [id number], implanted 08/29/2012 for sinus bradycardia, runs of supraventricular tachycardia  last checked 10/23/2012  . ICD (implantable cardiac defibrillator) in place   . SSS (sick sinus syndrome) with PAT and marked bradycardia with 3 sec pauses  08/30/2012  . Status post placement of cardiac pacemaker. 08/29/12 St. Jude device 08/30/2012  . HTN (hypertension) 08/30/2012  . Hyperlipidemia 08/30/2012  . Polyneuropathy in other diseases classified elsewhere   . Macular degeneration   . Coronary artery disease     stent in the LAD artery in 2002  . Venous insufficiency     ROS:   All systems reviewed and negative except as noted in the HPI.   Past Surgical History  Procedure Laterality Date  . Cardiac catheterization  2006    3 stents placed  . Insert / replace / remove pacemaker  08/29/2012    st jude   . Rotator cuff repair Left   . Abdominal hysterectomy    . Cesarean section    . Kidney stone surgery    . Rotator cuff repair Right   . Cardiac catheterization  06/2001    placement of BiodivYsio 2.5.10mm  stent dilated to 2.67mm in proximal left anterior descenting stenotic lesion  . Radiofrequency ablation  08/25/2010    ablation of left greater saphenous vein  . Hip arthroplasty Left 01/04/2014    Procedure: ARTHROPLASTY BIPOLAR HIP;  Surgeon: Shelda Pal, MD;  Location: [facility] OR;  Service: Orthopedics;  Laterality: Left;     Family History  Problem Relation Age of Onset  . Heart disease Mother   . Congestive Heart Failure Mother   . Heart disease Father   . Heart attack Father   . Heart disease Brother      History   Social History  . Marital Status: Widowed    Spouse Name: N/A    Number of Children: 2  . Years of Education: N/A   Occupational History  .     Social History Main Topics  . Smoking status: Never Smoker   . Smokeless tobacco: Not on file  . Alcohol Use: No  . Drug Use: No  . Sexual Activity: Not on file   Other Topics Concern  . Not on file   Social History Narrative  . No narrative on file     BP 167/77  Pulse 65  Ht 5\' 6"  (1.676 m)  Wt 132 lb 6.4 oz (60.056 kg)  BMI 21.38 kg/m2  Physical Exam:  Well appearing elderly woman, NAD HEENT: Unremarkable Neck:  No JVD,  no thyromegally Back:  No CVA tenderness Lungs:  Clear with no wheezes HEART:  Regular rate rhythm, no murmurs, no rubs, no clicks Abd:  soft, positive bowel sounds, no organomegally, no rebound, no guarding Ext:  2 plus pulses, trace peripheral edema in the left leg, no cyanosis, no clubbing Skin:  No rashes no nodules Neuro:  CN II through XII intact, motor grossly intact  DEVICE  Normal device function.  See PaceArt for details.   Assess/Plan:

## 2014-02-16 NOTE — Assessment & Plan Note (Signed)
PM interogation demonstrates asymptomatic non-sustained tachycardia

## 2014-02-16 NOTE — Patient Instructions (Signed)
Your physician wants you to follow-up in: 1 year  You will receive a reminder letter in the mail two months in advance. If you don't receive a letter, please call our office to schedule the follow-up appointment.  Your physician recommends that you continue on your current medications as directed. Please refer to the Current Medication list given to you today.  

## 2014-02-16 NOTE — Assessment & Plan Note (Signed)
Her St. Jude DDD PM is working normally. Will recheck in several months.  

## 2014-02-20 ENCOUNTER — Telehealth: Payer: Self-pay | Admitting: *Deleted

## 2014-02-20 NOTE — Telephone Encounter (Signed)
Pt was just seen by Dr Ladona Ridgelaylor and they are calling to find out how she should be taking her lasix

## 2014-02-20 NOTE — Telephone Encounter (Signed)
Made Shantel aware from [facility] that Dr Ladona Ridgelaylor states no changes f/u one year

## 2014-02-25 ENCOUNTER — Non-Acute Institutional Stay (SKILLED_NURSING_FACILITY): Payer: Medicare Other | Admitting: Internal Medicine

## 2014-02-25 ENCOUNTER — Encounter: Payer: Self-pay | Admitting: Internal Medicine

## 2014-02-25 DIAGNOSIS — I82402 Acute embolism and thrombosis of unspecified deep veins of left lower extremity: Secondary | ICD-10-CM

## 2014-02-25 DIAGNOSIS — D509 Iron deficiency anemia, unspecified: Secondary | ICD-10-CM

## 2014-02-25 DIAGNOSIS — I495 Sick sinus syndrome: Secondary | ICD-10-CM

## 2014-02-25 DIAGNOSIS — Z950 Presence of cardiac pacemaker: Secondary | ICD-10-CM

## 2014-02-25 DIAGNOSIS — I82409 Acute embolism and thrombosis of unspecified deep veins of unspecified lower extremity: Secondary | ICD-10-CM

## 2014-02-25 DIAGNOSIS — S72002D Fracture of unspecified part of neck of left femur, subsequent encounter for closed fracture with routine healing: Secondary | ICD-10-CM

## 2014-02-25 DIAGNOSIS — S72009D Fracture of unspecified part of neck of unspecified femur, subsequent encounter for closed fracture with routine healing: Secondary | ICD-10-CM

## 2014-02-25 DIAGNOSIS — I10 Essential (primary) hypertension: Secondary | ICD-10-CM

## 2014-02-25 NOTE — Progress Notes (Signed)
Patient ID: Pamela [name], female   DOB: 1921-04-05, 80 y.o.   MRN: [id number]   Facility; [facility] This is a discharge note.    Chief complaint; discharge note  History; this is an independent 80 year old woman who lives in her own home in [place]. She fell while working on yardwork at her home. She suffered a left hip fracture. She underwent a left hip hemiarthroplasty on 01/04/2014. The patient denies a history of falls or osteoporosis.  There does not appear to have been any major problems related to the surgery itself although she had a drop in her platelet count down to 106 on the day of discharge. The cause of this was not clear to her discharging physician. She was started on iron  Her stay here initially was remarkable for some increase left leg swelling-venous Doppler was positive for a DVT-and she was started on Xarelto twice a day this subsequently has been reduced to daily dosing.  The edema is significantly better.  She has progressed well with physical therapy and is doing well now with a rolling walker although she would continue to benefit from PT and OT-.  She also had some guaic positive stool although her hemoglobin appears to be rising.  This was thought possibly due to irritation secondary to aspirin-she was started on a proton pump inhibitor.  Her hemoglobin earlier this month was 10.8 which is significantly improved from her admission hemoglobin which was around 8.  Today she has no acute complaints is looking forward to going home she will have someone with her at night-appears to be ambulating pretty well with a walker she is bright alert and motivated to go home  She does have a history of sick sinus syndrome she does have a pacemaker this has been stable she saw cardiology about a week ago   Past Medical History   Diagnosis  Date   .  Pacemaker  08/29/2012     st jude accent DR RF device, model number [id number], serial number [id number], implanted 08/29/2012 for  sinus bradycardia, runs of supraventricular tachycardia last checked 10/23/2012   .  ICD (implantable cardiac defibrillator) in place    .  SSS (sick sinus syndrome) with PAT and marked bradycardia with 3 sec pauses  08/30/2012   .  Status post placement of cardiac pacemaker. 08/29/12 St. Jude device  08/30/2012   .  HTN (hypertension)  08/30/2012   .  Hyperlipidemia  08/30/2012   .  Polyneuropathy in other diseases classified elsewhere    .  Macular degeneration    .  Coronary artery disease      stent in the LAD artery in 2002   .  Venous insufficiency     Past Surgical History   Procedure  Laterality  Date   .  Cardiac catheterization   2006     3 stents placed   .  Insert / replace / remove pacemaker   08/29/2012     st jude   .  Rotator cuff repair  Left    .  Abdominal hysterectomy     .  Cesarean section     .  Kidney stone surgery     .  Rotator cuff repair  Right    .  Cardiac catheterization   06/2001     placement of BiodivYsio 2.5.10mm stent dilated to 2.48mm in proximal left anterior descenting stenotic lesion   .  Radiofrequency ablation   08/25/2010  ablation of left greater saphenous vein   .  Hip arthroplasty  Left  01/04/2014     Procedure: ARTHROPLASTY BIPOLAR HIP; Surgeon: Shelda PalMatthew D Olin, MD; [facility]; Service: Orthopedics; Laterality: Left;     Medications reviewed per MAR                                                          .     0   .      0   .       .       .       .          Social; the patient tells me that she lives in her in single-story home. She did not use ambulatory assist devices. She has no history of falls  reports that she has never smoked. She does not have any smokeless tobacco history on file. She reports that she does not drink alcohol or use illicit drugs .  Fam HX; indicated that her mother is deceased. She indicated that her father is deceased. She indicated that only one of her two sisters are alive. She  indicated that her brother is deceased .  Review of systems In general no complaints of fever or chills.  Head ears eyes nose mouth and throat does not complaining of any visual changes or nasal discharge.  Skin does not complaining of any itching or rashes surgical site left hip appears to be well healed  Respiratory no shortness of breath or cough Cardiac no chest pain --mild left lower extremity edema remains GI no abdominal pain or constipation  GU no voiding difficulties.  Musculoskeletal  At this point hip pain appears to be controlled as well as history of shoulder pain-she is receiving Norco with good relief Neurologic does not complaining of any headache or dizziness.  Psych is not complaining of any anxiety or depression   Physical examination  Physical exam.  Temperature 97.6 pulse 72 respirations 20 blood pressure 150/82-143/68-134/68-this appears relatively baseline weight has been relatively stable at 158.6 pounds this is a loss of about 6 pounds since admission Gen. remarkably well looking 80 year old woman bright and alert  Her skin is warm and dry surgical site left hip appears to be well-healed.  Oropharynx is clear mucous membranes moist  Respiratory clear entry bilaterally --no labored breathing Cardiac heart sounds are normal there is no murmurs gallops or bruits--she has some mild left lower extremity edema this is significantly improved from when she had the acute DVT  Abdomen no liver no spleen no tenderness no masses--is nontender positive bowel sounds    Musculoskeletal l -Moves all extremities x4 is ambulatory with a rolling walker --she is doing quite well with this do not see any deformities   Neurologic is grossly intact her speech is clear no lateralizing findings   Psych-she is alert and oriented pleasant and appropriate.  Labs.  02/02/2014.  WBC 4.3 hemoglobin 10.8 platelets 226.  01/26/2014.  WBC 3.3 hemoglobin 9.5 platelets  237.  Reticulocyte 2.8.  Sodium 145 potassium 3.8 BUN 16 creatinine 0.68.  Iron-28--total iron binding capacity 260--B12 greater than 2000--folate greater than 20--ferritin 110   Impression/plan   Left hip fracture status post left hip hemiarthroplasty. She seems to have done well. No  previously known gait difficulties--she is doing well with the rolling walker will need hepatic followup as needed as well as continued OT and PT for strengthening  History left leg DVT-again she is onXarelto--will defer followup to her primary care provider this has stabilized and clinically significantly improved   Thrombocytopenia; . The cause of this was not clear perhaps a perioperative medication--this appears to have normalized we'll update CBC before discharge notify primary care provider for results   Status post pacemaker implant in January 2014 this is stable   Hypertension --continues to have somewhat elevated systolics ranging from the 130s to 150s generally will defer followup with primary care provider since she is about to leave facility-- she is on by Bystolic 5 mg a day as well as Altace 2.5 mg --we'll update metabolic panel before discharge  History of chronic ischemic heart disease --this has been stable during her stay here recently saw cardiology-she is on a statin as well as anticoagulation currently Xarelto   Blood loss anemia on iron--as well as a proton pump inhibitor-we'll update CBC before discharge this will warrant followup by her primary care provider clinically she she's been stable--and hemoglobin has risen- again  some occult positive stools thought possibly related to irritation from aspirin-  Again she will be going home she will be by herself but will have continued PT and OT-she does have a rolling walker at home-and has strong friends support will have someone with her at night.  [id number] note greater than 30 minutes spent preparing this discharge summary

## 2014-03-09 ENCOUNTER — Other Ambulatory Visit: Payer: Self-pay | Admitting: Neurology

## 2014-03-09 NOTE — Telephone Encounter (Signed)
Patient has appt scheduled in Nov

## 2014-03-12 ENCOUNTER — Other Ambulatory Visit (HOSPITAL_COMMUNITY): Payer: Self-pay | Admitting: Family Medicine

## 2014-03-12 DIAGNOSIS — G8929 Other chronic pain: Secondary | ICD-10-CM

## 2014-03-16 ENCOUNTER — Ambulatory Visit (HOSPITAL_COMMUNITY)
Admission: RE | Admit: 2014-03-16 | Discharge: 2014-03-16 | Disposition: A | Discharge status: Home / Self Care | Payer: Medicare Other | Source: Ambulatory Visit | Attending: Family Medicine | Admitting: Family Medicine

## 2014-03-16 DIAGNOSIS — G8929 Other chronic pain: Secondary | ICD-10-CM

## 2014-04-07 ENCOUNTER — Other Ambulatory Visit (HOSPITAL_COMMUNITY): Payer: Self-pay | Admitting: Family Medicine

## 2014-04-07 DIAGNOSIS — Z1231 Encounter for screening mammogram for malignant neoplasm of breast: Secondary | ICD-10-CM

## 2014-04-09 ENCOUNTER — Ambulatory Visit (HOSPITAL_COMMUNITY): Payer: Medicare Other

## 2014-04-09 ENCOUNTER — Ambulatory Visit (HOSPITAL_COMMUNITY)
Admission: RE | Admit: 2014-04-09 | Discharge: 2014-04-09 | Disposition: A | Discharge status: Home / Self Care | Payer: Medicare Other | Source: Ambulatory Visit | Attending: Family Medicine | Admitting: Family Medicine

## 2014-04-09 DIAGNOSIS — Z1231 Encounter for screening mammogram for malignant neoplasm of breast: Secondary | ICD-10-CM | POA: Diagnosis present

## 2014-05-20 ENCOUNTER — Encounter: Payer: Self-pay | Admitting: Internal Medicine

## 2014-05-20 ENCOUNTER — Ambulatory Visit (INDEPENDENT_AMBULATORY_CARE_PROVIDER_SITE_OTHER): Payer: Medicare Other | Admitting: *Deleted

## 2014-05-20 DIAGNOSIS — I495 Sick sinus syndrome: Secondary | ICD-10-CM

## 2014-05-20 LAB — MDC_IDC_ENUM_SESS_TYPE_REMOTE
Battery Remaining Longevity: 141 mo
Brady Statistic AP VP Percent: 1 %
Brady Statistic AP VS Percent: 34 %
Brady Statistic AS VP Percent: 1 %
Brady Statistic AS VS Percent: 65 %
Brady Statistic RA Percent Paced: 34 %
Implantable Pulse Generator Serial Number: 7437302
Lead Channel Impedance Value: 410 Ohm
Lead Channel Pacing Threshold Amplitude: 0.625 V
Lead Channel Pacing Threshold Pulse Width: 0.4 ms
Lead Channel Sensing Intrinsic Amplitude: 4.2 mV
Lead Channel Setting Pacing Amplitude: 0.875
Lead Channel Setting Pacing Amplitude: 1.625
Lead Channel Setting Pacing Pulse Width: 0.4 ms
Lead Channel Setting Sensing Sensitivity: 2 mV
MDC IDC MSMT BATTERY REMAINING PERCENTAGE: 95.5 %
MDC IDC MSMT BATTERY VOLTAGE: 3.01 V
MDC IDC MSMT LEADCHNL RA IMPEDANCE VALUE: 390 Ohm
MDC IDC MSMT LEADCHNL RA PACING THRESHOLD PULSEWIDTH: 0.4 ms
MDC IDC MSMT LEADCHNL RA SENSING INTR AMPL: 2.9 mV
MDC IDC MSMT LEADCHNL RV PACING THRESHOLD AMPLITUDE: 0.625 V
MDC IDC SESS DTM: 20151021061016
MDC IDC STAT BRADY RV PERCENT PACED: 1 %

## 2014-05-20 NOTE — Progress Notes (Signed)
Remote pacemaker transmission.   

## 2014-06-16 ENCOUNTER — Encounter: Payer: Self-pay | Admitting: Cardiology

## 2014-06-17 ENCOUNTER — Encounter: Payer: Self-pay | Admitting: Neurology

## 2014-06-23 ENCOUNTER — Encounter: Payer: Self-pay | Admitting: Neurology

## 2014-06-23 ENCOUNTER — Ambulatory Visit (INDEPENDENT_AMBULATORY_CARE_PROVIDER_SITE_OTHER): Payer: Medicare Other | Admitting: Neurology

## 2014-06-23 VITALS — BP 183/79 | HR 68 | Ht 66.0 in | Wt 157.0 lb

## 2014-06-23 DIAGNOSIS — G609 Hereditary and idiopathic neuropathy, unspecified: Secondary | ICD-10-CM

## 2014-06-23 MED ORDER — HYDROCODONE-ACETAMINOPHEN 5-325 MG PO TABS: 1.0000 | ORAL_TABLET | Freq: Four times a day (QID) | ORAL | Status: DC | PRN

## 2014-06-23 NOTE — Patient Instructions (Signed)

## 2014-06-23 NOTE — Progress Notes (Signed)
Reason for visit: Peripheral neuropathy  Pamela [name] is an 80 y.o. female  History of present illness:  Pamela [name] is a 80 year old right-handed white female with a history of a peripheral neuropathy. The patient fell in June 2015 fractured her left hip, requiring surgery and subsequent rehabilitation. The patient has returned to living at home, but she still has some discomfort in the left thigh. The patient indicates that her feet remain numb, and she does have some gait instability. She has been using a cane or a walker. She has not had any further falls. The patient did have a deep venous thrombosis in the left leg following the fracture. She does report some discomfort in the feet at nighttime, but she is taking 100 mg of gabapentin at night. She takes hydrocodone for the left thigh pain. She returns to this office for an evaluation.  Past Medical History  Diagnosis Date  . Pacemaker 08/29/2012    st jude accent DR RF device, model number [id number], serial number [id number], implanted 08/29/2012 for sinus bradycardia, runs of supraventricular tachycardia  last checked 10/23/2012  . ICD (implantable cardiac defibrillator) in place   . SSS (sick sinus syndrome) with PAT and marked bradycardia with 3 sec pauses  08/30/2012  . Status post placement of cardiac pacemaker. 08/29/12 St. Jude device 08/30/2012  . HTN (hypertension) 08/30/2012  . Hyperlipidemia 08/30/2012  . Polyneuropathy in other diseases classified elsewhere   . Macular degeneration   . Coronary artery disease     stent in the LAD artery in 2002  . Venous insufficiency     Past Surgical History  Procedure Laterality Date  . Cardiac catheterization  2006    3 stents placed  . Insert / replace / remove pacemaker  08/29/2012    st jude   . Rotator cuff repair Left   . Abdominal hysterectomy    . Cesarean section    . Kidney stone surgery    . Rotator cuff repair Right   . Cardiac catheterization  06/2001    placement  of BiodivYsio 2.5.10mm stent dilated to 2.7175mm in proximal left anterior descenting stenotic lesion  . Radiofrequency ablation  08/25/2010    ablation of left greater saphenous vein  . Hip arthroplasty Left 01/04/2014    Procedure: ARTHROPLASTY BIPOLAR HIP;  Surgeon: Shelda PalMatthew D Olin, MD;  [facility];  Service: Orthopedics;  Laterality: Left;    Family History  Problem Relation Age of Onset  . Heart disease Mother   . Congestive Heart Failure Mother   . Heart disease Father   . Heart attack Father   . Heart disease Brother     Social history:  reports that she has never smoked. She does not have any smokeless tobacco history on file. She reports that she does not drink alcohol or use illicit drugs.   No Known Allergies  Medications:  Current Outpatient Prescriptions on File Prior to Visit  Medication Sig Dispense Refill  . acetaminophen (TYLENOL) 325 MG tablet Take 650 mg by mouth every 6 (six) hours as needed for pain.    Marland Kitchen. atorvastatin (LIPITOR) 10 MG tablet Take 1 tablet (10 mg total) by mouth daily. 30 tablet 6  . Cholecalciferol (VITAMIN D-3 PO) Take 1 capsule by mouth daily.    Marland Kitchen. docusate sodium 100 MG CAPS Take 100 mg by mouth 2 (two) times daily. (Patient taking differently: Take 100 mg by mouth as needed. ) 10 capsule 0  . ezetimibe (ZETIA)  10 MG tablet Take 10 mg by mouth daily.    . ferrous sulfate 325 (65 FE) MG tablet Take 325 mg by mouth 2 (two) times daily with a meal.     . l-methylfolate-B6-B12 (METANX) 3-35-2 MG TABS TAKE ONE TABLET TWICE DAILY 60 tablet 3  . magnesium hydroxide (MILK OF MAGNESIA) 400 MG/5ML suspension Take 5 mLs by mouth daily as needed for mild constipation.    . Multiple Vitamins-Minerals (OCUVITE PO) Take 1 tablet by mouth 2 (two) times daily.    . nebivolol (BYSTOLIC) 5 MG tablet Take 5 mg by mouth daily.    Bertram Gala. Polyethyl Glycol-Propyl Glycol (SYSTANE OP) Apply 1 drop to eye 2 (two) times daily as needed (Dry Eyes).     . ramipril (ALTACE) 2.5  MG capsule Take 2.5 mg by mouth daily.     No current facility-administered medications on file prior to visit.    ROS:  Out of a complete 14 system review of symptoms, the patient complains only of the following symptoms, and all other reviewed systems are negative.  Left leg pain Foot numbness Gait instability  Blood pressure 183/79, pulse 68, height 5\' 6"  (1.676 m), weight 157 lb (71.215 kg).  Physical Exam  General: The patient is alert and cooperative at the time of the examination.  Skin: 1+ edema at the ankles is noted bilaterally.   Neurologic Exam  Mental status: The patient is oriented x 3.  Cranial nerves: Facial symmetry is present. Speech is normal, no aphasia or dysarthria is noted. Extraocular movements are full. Visual fields are full.  Motor: The patient has good strength in all 4 extremities.  Sensory examination: There is a stocking pattern pinprick sensory deficit one half way up the legs bilaterally.  Coordination: The patient has good finger-nose-finger and heel-to-shin bilaterally.  Gait and station: The patient has a wide-based, unsteady gait. The patient uses a cane for ambulation. Tandem gait was not attempted. Romberg is negative. No drift is seen.  Reflexes: Deep tendon reflexes are symmetric, but are depressed.   Assessment/Plan:  1. Peripheral neuropathy  2. Gait disorder  The patient will continue her gabapentin at night, a prescription for hydrocodone was given today, and she will follow-up in about 9 months. The patient is to use safety measures to prevent further falls.  Marlan Palau. Keith Willis MD 06/23/2014 6:50 PM  [facility] [address] [place], [place] [id number]  Phone [phone] Fax [phone]

## 2014-07-09 ENCOUNTER — Encounter (HOSPITAL_COMMUNITY): Payer: Self-pay | Admitting: Cardiovascular Disease

## 2014-08-24 ENCOUNTER — Encounter: Payer: Self-pay | Admitting: Internal Medicine

## 2014-08-24 ENCOUNTER — Ambulatory Visit (INDEPENDENT_AMBULATORY_CARE_PROVIDER_SITE_OTHER): Payer: Medicare Other | Admitting: *Deleted

## 2014-08-24 DIAGNOSIS — I495 Sick sinus syndrome: Secondary | ICD-10-CM

## 2014-08-24 LAB — MDC_IDC_ENUM_SESS_TYPE_REMOTE
Battery Voltage: 3.01 V
Brady Statistic AP VP Percent: 1 %
Brady Statistic AS VP Percent: 1 %
Brady Statistic AS VS Percent: 66 %
Brady Statistic RA Percent Paced: 33 %
Brady Statistic RV Percent Paced: 1 %
Date Time Interrogation Session: 20160125084101
Implantable Pulse Generator Model: 2210
Implantable Pulse Generator Serial Number: 7437302
Lead Channel Impedance Value: 440 Ohm
Lead Channel Pacing Threshold Amplitude: 0.5 V
Lead Channel Pacing Threshold Amplitude: 0.625 V
Lead Channel Pacing Threshold Pulse Width: 0.4 ms
Lead Channel Setting Pacing Amplitude: 0.875
Lead Channel Setting Pacing Pulse Width: 0.4 ms
MDC IDC MSMT BATTERY REMAINING LONGEVITY: 142 mo
MDC IDC MSMT BATTERY REMAINING PERCENTAGE: 95.5 %
MDC IDC MSMT LEADCHNL RA IMPEDANCE VALUE: 400 Ohm
MDC IDC MSMT LEADCHNL RA PACING THRESHOLD PULSEWIDTH: 0.4 ms
MDC IDC MSMT LEADCHNL RA SENSING INTR AMPL: 3 mV
MDC IDC MSMT LEADCHNL RV SENSING INTR AMPL: 4.7 mV
MDC IDC SET LEADCHNL RA PACING AMPLITUDE: 1.5 V
MDC IDC SET LEADCHNL RV SENSING SENSITIVITY: 2 mV
MDC IDC STAT BRADY AP VS PERCENT: 34 %

## 2014-08-24 NOTE — Progress Notes (Signed)
Remote pacemaker transmission.   

## 2014-08-26 ENCOUNTER — Other Ambulatory Visit: Payer: Self-pay | Admitting: Internal Medicine

## 2014-08-26 NOTE — Telephone Encounter (Signed)
Spoke with pt and will forward to Dr. Ladona Ridgelaylor if ok to fill Bystolic 5 mg. Dr. Randel Piggroitoruit started pt on in May 2014. Will forward to Dr. Ladona Ridgelaylor

## 2014-08-26 NOTE — Telephone Encounter (Signed)
Received fax refill request  Rx # [id number] Medication:  Bystolic 5 mg tab Qty 30 Sig:  Take one tablet by mouth every day Physician:  Ladona Ridgelaylor

## 2014-08-27 MED ORDER — NEBIVOLOL HCL 5 MG PO TABS: 5.0000 mg | ORAL_TABLET | Freq: Every day | ORAL | Status: DC

## 2014-08-27 NOTE — Telephone Encounter (Signed)
Bystolic is ok. GT

## 2014-08-27 NOTE — Addendum Note (Signed)
Addended by: Burman NievesASHWORTH, Ziah Turvey T on: 08/27/2014 08:24 AM   Modules accepted: Orders

## 2014-08-27 NOTE — Telephone Encounter (Signed)
Bystolic sent to pharmacy.

## 2014-08-28 ENCOUNTER — Other Ambulatory Visit: Payer: Self-pay | Admitting: Internal Medicine

## 2014-09-02 ENCOUNTER — Encounter: Payer: Self-pay | Admitting: Cardiology

## 2014-09-21 ENCOUNTER — Telehealth: Payer: Self-pay | Admitting: *Deleted

## 2014-09-21 NOTE — Telephone Encounter (Signed)
ANOTHER FORM FAXED FROM [facility] [place]

## 2014-09-21 NOTE — Telephone Encounter (Signed)
Rejected rx for bystolic 5 mg tab form in bin

## 2014-09-22 NOTE — Telephone Encounter (Signed)
This is a Dr Ladona Ridgelaylor patient who will be here tomorrow ([facility] [address] will give him information tomorrow

## 2014-09-29 ENCOUNTER — Other Ambulatory Visit: Payer: Self-pay | Admitting: *Deleted

## 2014-09-29 NOTE — Telephone Encounter (Signed)
RAMIPRIL 2.5 MG #30

## 2014-09-30 ENCOUNTER — Telehealth: Payer: Self-pay | Admitting: *Deleted

## 2014-09-30 MED ORDER — RAMIPRIL 2.5 MG PO CAPS: 2.5000 mg | ORAL_CAPSULE | Freq: Every day | ORAL | Status: DC

## 2014-09-30 NOTE — Telephone Encounter (Signed)
Ramipril 2.5 mg #30

## 2014-10-23 ENCOUNTER — Other Ambulatory Visit: Payer: Self-pay | Admitting: *Deleted

## 2014-10-23 MED ORDER — ATENOLOL 25 MG PO TABS: 25.0000 mg | ORAL_TABLET | Freq: Every day | ORAL | Status: DC

## 2014-11-11 ENCOUNTER — Telehealth: Payer: Self-pay | Admitting: Internal Medicine

## 2014-11-11 NOTE — Telephone Encounter (Signed)
Pt's insurance will NOT pay for Bystolic.can she have something else  Will route to Dr Ladona Ridgelaylor

## 2014-11-11 NOTE — Telephone Encounter (Signed)
Patient wants to know if she can be switched from Bistolic due to cost. / tg

## 2014-11-17 NOTE — Telephone Encounter (Signed)
Try atenolol 25 mg daily. GT

## 2014-11-18 ENCOUNTER — Telehealth: Payer: Self-pay

## 2014-11-18 NOTE — Telephone Encounter (Signed)
Pamela MawGregg W Taylor, MD at 11/17/2014 10:57 PM     Status: Signed       Expand All Collapse All   Try atenolol 25 mg daily. GT            Nori Riisatherine A Renesmae Donahey, RN at 11/11/2014 1:30 PM     Status: Signed       Expand All Collapse All   Pt's insurance will NOT pay for Bystolic.can she have something else  Will route to Dr Ladona Ridgelaylor

## 2014-11-24 ENCOUNTER — Ambulatory Visit (INDEPENDENT_AMBULATORY_CARE_PROVIDER_SITE_OTHER): Payer: Medicare Other | Admitting: *Deleted

## 2014-11-24 DIAGNOSIS — I495 Sick sinus syndrome: Secondary | ICD-10-CM

## 2014-11-24 LAB — MDC_IDC_ENUM_SESS_TYPE_REMOTE
Battery Remaining Longevity: 140 mo
Battery Remaining Percentage: 95.5 %
Brady Statistic AP VP Percent: 1 %
Brady Statistic AP VS Percent: 37 %
Brady Statistic AS VP Percent: 1 %
Brady Statistic RV Percent Paced: 1 %
Date Time Interrogation Session: 20160426071922
Implantable Pulse Generator Model: 2210
Implantable Pulse Generator Serial Number: 7437302
Lead Channel Impedance Value: 390 Ohm
Lead Channel Pacing Threshold Amplitude: 0.625 V
Lead Channel Pacing Threshold Pulse Width: 0.4 ms
Lead Channel Pacing Threshold Pulse Width: 0.4 ms
Lead Channel Sensing Intrinsic Amplitude: 4.2 mV
Lead Channel Setting Pacing Amplitude: 0.875
Lead Channel Setting Pacing Amplitude: 1.5 V
Lead Channel Setting Pacing Pulse Width: 0.4 ms
MDC IDC MSMT BATTERY VOLTAGE: 3.01 V
MDC IDC MSMT LEADCHNL RA PACING THRESHOLD AMPLITUDE: 0.5 V
MDC IDC MSMT LEADCHNL RA SENSING INTR AMPL: 2.5 mV
MDC IDC MSMT LEADCHNL RV IMPEDANCE VALUE: 430 Ohm
MDC IDC SET LEADCHNL RV SENSING SENSITIVITY: 2 mV
MDC IDC STAT BRADY AS VS PERCENT: 63 %
MDC IDC STAT BRADY RA PERCENT PACED: 36 %

## 2014-11-24 NOTE — Progress Notes (Signed)
Remote pacemaker transmission.   

## 2014-12-10 ENCOUNTER — Encounter: Payer: Self-pay | Admitting: Cardiology

## 2014-12-15 ENCOUNTER — Encounter: Payer: Self-pay | Admitting: Internal Medicine

## 2015-01-21 ENCOUNTER — Other Ambulatory Visit: Payer: Self-pay | Admitting: Neurology

## 2015-01-22 ENCOUNTER — Other Ambulatory Visit: Payer: Self-pay | Admitting: Cardiovascular Disease

## 2015-01-22 ENCOUNTER — Other Ambulatory Visit: Payer: Self-pay | Admitting: Neurology

## 2015-02-10 ENCOUNTER — Encounter (HOSPITAL_COMMUNITY): Payer: Self-pay | Admitting: Emergency Medicine

## 2015-02-10 ENCOUNTER — Emergency Department (HOSPITAL_COMMUNITY): Payer: Medicare Other

## 2015-02-10 ENCOUNTER — Emergency Department (HOSPITAL_COMMUNITY)
Admission: EM | Admit: 2015-02-10 | Discharge: 2015-02-10 | Disposition: A | Discharge status: Home / Self Care | Payer: Medicare Other | Attending: Emergency Medicine | Admitting: Emergency Medicine

## 2015-02-10 DIAGNOSIS — Z79899 Other long term (current) drug therapy: Secondary | ICD-10-CM | POA: Diagnosis not present

## 2015-02-10 DIAGNOSIS — M5136 Other intervertebral disc degeneration, lumbar region: Secondary | ICD-10-CM | POA: Insufficient documentation

## 2015-02-10 DIAGNOSIS — Z9889 Other specified postprocedural states: Secondary | ICD-10-CM | POA: Insufficient documentation

## 2015-02-10 DIAGNOSIS — Z7982 Long term (current) use of aspirin: Secondary | ICD-10-CM | POA: Diagnosis not present

## 2015-02-10 DIAGNOSIS — Z950 Presence of cardiac pacemaker: Secondary | ICD-10-CM | POA: Insufficient documentation

## 2015-02-10 DIAGNOSIS — S32028A Other fracture of second lumbar vertebra, initial encounter for closed fracture: Secondary | ICD-10-CM | POA: Diagnosis not present

## 2015-02-10 DIAGNOSIS — Y9289 Other specified places as the place of occurrence of the external cause: Secondary | ICD-10-CM | POA: Diagnosis not present

## 2015-02-10 DIAGNOSIS — I10 Essential (primary) hypertension: Secondary | ICD-10-CM | POA: Diagnosis not present

## 2015-02-10 DIAGNOSIS — Y998 Other external cause status: Secondary | ICD-10-CM | POA: Diagnosis not present

## 2015-02-10 DIAGNOSIS — Y9389 Activity, other specified: Secondary | ICD-10-CM | POA: Diagnosis not present

## 2015-02-10 DIAGNOSIS — S3992XA Unspecified injury of lower back, initial encounter: Secondary | ICD-10-CM | POA: Diagnosis present

## 2015-02-10 DIAGNOSIS — S32018A Other fracture of first lumbar vertebra, initial encounter for closed fracture: Secondary | ICD-10-CM | POA: Insufficient documentation

## 2015-02-10 DIAGNOSIS — S32009A Unspecified fracture of unspecified lumbar vertebra, initial encounter for closed fracture: Secondary | ICD-10-CM

## 2015-02-10 DIAGNOSIS — E785 Hyperlipidemia, unspecified: Secondary | ICD-10-CM | POA: Diagnosis not present

## 2015-02-10 DIAGNOSIS — I2510 Atherosclerotic heart disease of native coronary artery without angina pectoris: Secondary | ICD-10-CM | POA: Insufficient documentation

## 2015-02-10 DIAGNOSIS — W010XXA Fall on same level from slipping, tripping and stumbling without subsequent striking against object, initial encounter: Secondary | ICD-10-CM | POA: Insufficient documentation

## 2015-02-10 LAB — URINALYSIS, ROUTINE W REFLEX MICROSCOPIC
BILIRUBIN URINE: NEGATIVE
GLUCOSE, UA: NEGATIVE mg/dL
KETONES UR: NEGATIVE mg/dL
Leukocytes, UA: NEGATIVE
NITRITE: NEGATIVE
Protein, ur: NEGATIVE mg/dL
Specific Gravity, Urine: <1.005 — ABNORMAL LOW (ref 1.005–1.030)
UROBILINOGEN UA: 0.2 mg/dL (ref 0.0–1.0)
pH: 6 (ref 5.0–8.0)

## 2015-02-10 LAB — URINE MICROSCOPIC-ADD ON

## 2015-02-10 MED ORDER — ACETAMINOPHEN 325 MG PO TABS
650.0000 mg | ORAL_TABLET | Freq: Once | ORAL | Status: AC
  Administered 2015-02-10: 650 mg via ORAL
  Filled 2015-02-10: qty 2

## 2015-02-10 MED ORDER — HYDROCODONE-ACETAMINOPHEN 5-325 MG PO TABS: 1.0000 | ORAL_TABLET | ORAL | Status: DC | PRN

## 2015-02-10 NOTE — ED Notes (Signed)
Pt reports was in her basement and fell against a concrete wall on Wednesday. Pt reports left side back pain ever since. Pt denies hitting head or loc. Moderate abrasion noted to back. Pt reports seen for same at dermatologist on Wednesday. Pt reports pain with movement of left side.

## 2015-02-10 NOTE — Discharge Instructions (Signed)
Lumbar Fracture °A fracture of a bone is the same as a break in the bone. A fracture in the lumbar area is a break that involves one of many parts that make up the 5 bones of the low back area. This is just above the pelvis.  °CAUSES °Most of these injuries occur as a result of an accident such as: °· A fall. °· A car accident. °· Recreational activities. °· A smaller number occur due to: °¨ Industrial, farm, and aviation accidents. °¨ Gunshot wounds and direct blows to the back. °¨ Parachuting incidents. °Most lumbar fractures affect the "building blocks" or the main portion of the spine known as the "vertebral bodies" (see the image on the right). A smaller number involve breaks to portions of bone that extend to the sides or backward behind the vertebral body. In the elderly, a sudden break can happen without an apparent cause. This is because the bones of the back have become extremely thin and fragile. This condition is known as osteoporosis. °SYMPTOMS °Patients with lumbar fractures have severe pain even if the actual break is small or limited, and there is no injury to nearby nerves. More severe or complex injuries involving other bones and/or organs may include:  °· Deformity of the back bones. °· Swelling/bruising over the injured area. °· Limited ability to move the affected area. °· Partial or complete loss of function of the bladder and/or bowels. (This may be due to injury to nearby nerves). °· More severe injuries can also cause: °¨ Loss of sensation and/or strength in the legs, feet, and toes. °¨ Paralysis. °DIAGNOSIS °In most cases, a lumbar fracture will be suspected by what happened just prior to the onset of back pain. X-rays and special imaging (CT scan and MRI imaging) are used to confirm the diagnosis as well as finding out the type and severity of the break or breaks. These tests guide treatment. But there are times when special imaging cannot be done. For example, MRI cannot be done if there  is an implanted metallic device (such as a pacemaker). In these cases, other tests and imaging are done. °If there has been nerve damage, more tests can be done. These include: °· Tests of nerve function through muscles (nerve conduction studies and electromyography). °· Tests of bladder function (urodynamics). °· Tests that focus on defining specific nerve problems before surgery and what improvement has come about after surgery (evoked potentials). °TREATMENT °Common injuries may involve a small break off of the main surface of the back bone. Or they may be in the form of a partial flattening or compression of the bone. Hospital care may not be needed for these. Medicine for pain control, special back bracing, and limitations in activity are done first. Physical therapy follows later. °Complex breaks, multiple fractures of the spine, or unstable injuries can damage the spinal cord. They may require an operation to remove pressure from the nerves and/or spinal cord and to stabilize the broken pieces of bone. Each individual set of injuries is unique. The surgeon will take into consideration many things when planning the best surgical approach that will give the highest likelihood of a good outcome.  °HOME CARE INSTRUCTIONS °There is pain and stiffness in the back for weeks after a vertebral fracture. Bed rest, pain medicine, and a slow return to activity are generally recommended. Neck and back braces may be helpful in reducing pain and increasing mobility. When your pain allows, simple walking will help to begin the   process of returning to normal activities. Exercises to improve motion and to strengthen the back may also be useful after the initial pain goes away. This will be guided by your caregiver and the team (nurses, physical therapists, occupational therapists, etc.) involved with your ongoing care. For the elderly, treatment for osteoporosis may be needed to help reduce the risk of fractures in the  future. Arrange for follow-up care as recommended to assure proper long-term care and prevention of further spine injury. The failure to follow-up as recommended could result in permanent injury, disability, and a chronic painful condition. SEEK MEDICAL CARE IF:  Pain is not effectively controlled with medication.  You feel unable to decrease pain medication over time as planned.  Activity level is not improving as planned and/or expected. SEEK IMMEDIATE MEDICAL CARE IF:  You have increasing pain, vomiting, or are unable to move around at all.  You have numbness, tingling, weakness, or paralysis of any part of your body.  You have loss of normal bowel or bladder control.  You have difficulty breathing, cough, fever, chest or abdominal pain. Document Released: 11/01/2006 Document Revised: 10/09/2011 Document Reviewed: 07/02/2007 [facility] Patient Information 2015 [employer] [place]. This information is not intended to replace advice given to you by your health care provider. Make sure you discuss any questions you have with your health care provider.   You may take the hydrocodone prescribed for pain relief.  This will make you drowsy - do not drive within 4 hours of taking this medication.

## 2015-02-10 NOTE — ED Provider Notes (Signed)
CSN: [id number]     Arrival date & time 02/10/15  0907 History   First MD Initiated Contact with Patient 02/10/15 [phone]     Chief Complaint  Patient presents with  . Fall     (Consider location/radiation/quality/duration/timing/severity/associated sxs/prior Treatment) The history is provided by the patient.   Pamela [name] is a 81 y.o. female with a history significant for hypertension, CAD and sick sinus syndrome with pacemaker presenting with a one-week history of low back pain which started after she tripped and fell in her basement one week ago.  She describes a somewhat controlled fall but landed against the wall in her basement and has had pain in her left lower back since the event.  She has noticed increased swelling and bruising at the site of injury.  She currently takes hydrocodone when necessary for chronic hip pain and took her last dose of this prior to bed last night which gave mild improvement in her symptoms.  She is also taking Tylenol this morning.  She denies radiation of pain into her legs, she denies weakness or numbness, difficulty ambulating except as limited by pain.  She's had no dysuria, hematuria or urinary or bowel retention or incontinence.     Past Medical History  Diagnosis Date  . Pacemaker 08/29/2012    st jude accent DR RF device, model number [id number], serial number [id number], implanted 08/29/2012 for sinus bradycardia, runs of supraventricular tachycardia  last checked 10/23/2012  . ICD (implantable cardiac defibrillator) in place   . SSS (sick sinus syndrome) with PAT and marked bradycardia with 3 sec pauses  08/30/2012  . Status post placement of cardiac pacemaker. 08/29/12 St. Jude device 08/30/2012  . HTN (hypertension) 08/30/2012  . Hyperlipidemia 08/30/2012  . Polyneuropathy in other diseases classified elsewhere   . Macular degeneration   . Coronary artery disease     stent in the LAD artery in 2002  . Venous insufficiency    Past Surgical History    Procedure Laterality Date  . Cardiac catheterization  2006    3 stents placed  . Insert / replace / remove pacemaker  08/29/2012    st jude   . Rotator cuff repair Left   . Abdominal hysterectomy    . Cesarean section    . Kidney stone surgery    . Rotator cuff repair Right   . Cardiac catheterization  06/2001    placement of BiodivYsio 2.5.10mm stent dilated to 2.5875mm in proximal left anterior descenting stenotic lesion  . Radiofrequency ablation  08/25/2010    ablation of left greater saphenous vein  . Hip arthroplasty Left 01/04/2014    Procedure: ARTHROPLASTY BIPOLAR HIP;  Surgeon: Shelda PalMatthew D Olin, MD;  Location: [facility] OR;  Service: Orthopedics;  Laterality: Left;  . Permanent pacemaker insertion N/A 08/29/2012    Procedure: PERMANENT PACEMAKER INSERTION;  Surgeon: Thurmon FairMihai Croitoru, MD;  Location: [facility] CATH LAB;  Service: Cardiovascular;  Laterality: N/A;   Family History  Problem Relation Age of Onset  . Heart disease Mother   . Congestive Heart Failure Mother   . Heart disease Father   . Heart attack Father   . Heart disease Brother    History  Substance Use Topics  . Smoking status: Never Smoker   . Smokeless tobacco: Not on file  . Alcohol Use: No   OB History    No data available     Review of Systems  Constitutional: Negative for fever.  HENT: Negative for congestion and sore  throat.   Eyes: Negative.   Respiratory: Negative for chest tightness and shortness of breath.   Cardiovascular: Negative for chest pain.  Gastrointestinal: Negative for nausea and abdominal pain.  Genitourinary: Negative.   Musculoskeletal: Positive for back pain and arthralgias. Negative for joint swelling and neck pain.  Skin: Negative.  Negative for rash and wound.  Neurological: Negative for dizziness, weakness, light-headedness, numbness and headaches.  Psychiatric/Behavioral: Negative.       Allergies  Review of patient's allergies indicates no known allergies.  Home Medications    Prior to Admission medications   Medication Sig Start Date End Date Taking? Authorizing Provider  acetaminophen (TYLENOL) 500 MG tablet Take 1,000 mg by mouth every 6 (six) hours as needed for moderate pain.   Yes Historical Provider, MD  aspirin EC 81 MG tablet Take 81 mg by mouth daily.   Yes Historical Provider, MD  atenolol (TENORMIN) 25 MG tablet Take 1 tablet (25 mg total) by mouth daily. 10/23/14  Yes Marinus Maw, MD  atorvastatin (LIPITOR) 10 MG tablet TAKE ONE (1) TABLET BY MOUTH EVERY DAY 08/28/14  Yes Marinus Maw, MD  Cholecalciferol (VITAMIN D-3 PO) Take 1 capsule by mouth daily.   Yes Historical Provider, MD  docusate sodium 100 MG CAPS Take 100 mg by mouth 2 (two) times daily. Patient taking differently: Take 100 mg by mouth as needed.  01/06/14  Yes Nishant Dhungel, MD  ezetimibe (ZETIA) 10 MG tablet Take 10 mg by mouth daily.   Yes Historical Provider, MD  ferrous sulfate 325 (65 FE) MG tablet Take 325 mg by mouth daily.    Yes Historical Provider, MD  furosemide (LASIX) 20 MG tablet TAKE ONE TABLET BY MOUTH DAILY AS NEEDEDFOR EDEMA 01/22/15  Yes Marinus Maw, MD  gabapentin (NEURONTIN) 100 MG capsule Take 100 mg by mouth at bedtime.   Yes Historical Provider, MD  l-methylfolate-B6-B12 [facility]) 3-35-2 MG TABS TAKE ONE TABLET TWICE DAILY 01/21/15  Yes York Spaniel, MD  Multiple Vitamins-Minerals (OCUVITE PO) Take 1 tablet by mouth 2 (two) times daily.   Yes Historical Provider, MD  Polyethyl Glycol-Propyl Glycol (SYSTANE OP) Apply 1 drop to eye 2 (two) times daily as needed (Dry Eyes).    Yes Historical Provider, MD  ramipril (ALTACE) 2.5 MG capsule Take 1 capsule (2.5 mg total) by mouth daily. 09/30/14  Yes Marinus Maw, MD  vitamin B-12 (CYANOCOBALAMIN) 1000 MCG tablet Take 1,000 mcg by mouth daily.   Yes Historical Provider, MD  acetaminophen (TYLENOL) 325 MG tablet Take 650 mg by mouth every 6 (six) hours as needed for pain.    Historical Provider, MD   HYDROcodone-acetaminophen (NORCO/VICODIN) 5-325 MG per tablet Take 1 tablet by mouth every 4 (four) hours as needed. 02/10/15   Burgess Amor, PA-C   BP 181/86 mmHg  Pulse 81  Temp(Src) 97.4 F (36.3 C) (Oral)  Resp 15  Ht 5\' 6"  (1.676 m)  Wt 156 lb (70.761 kg)  BMI 25.19 kg/m2  SpO2 98% Physical Exam  Constitutional: She is oriented to person, place, and time. She appears well-developed and well-nourished.  HENT:  Head: Normocephalic.  Eyes: Conjunctivae are normal.  Neck: Normal range of motion. Neck supple.  Cardiovascular: Normal rate and intact distal pulses.   Pedal pulses normal.  Pulmonary/Chest: Effort normal.  Abdominal: Soft. Bowel sounds are normal. She exhibits no distension and no mass.  Musculoskeletal: Normal range of motion. She exhibits no edema.       Lumbar  back: She exhibits tenderness and swelling. She exhibits no edema and no spasm.       Back:  Tender to palpation along the lumbar spine and left lumbar region.  She has moderate ecchymosis left lateral lumbar.  There is no palpable deformities, no step-offs.  No CVA tenderness.  Neurological: She is alert and oriented to person, place, and time. She has normal strength. She displays no atrophy and no tremor. No sensory deficit. Gait normal.  Reflex Scores:      Patellar reflexes are 2+ on the right side and 2+ on the left side.      Achilles reflexes are 2+ on the right side and 2+ on the left side. No strength deficit noted in hip and knee flexor and extensor muscle groups.  Ankle flexion and extension intact.  Skin: Skin is warm and dry.  Psychiatric: She has a normal mood and affect.  Nursing note and vitals reviewed.   ED Course  Procedures (including critical care time) Labs Review Labs Reviewed  URINALYSIS, ROUTINE W REFLEX MICROSCOPIC (NOT AT [facility]) - Abnormal; Notable for the following:    Specific Gravity, Urine <1.005 (*)    Hgb urine dipstick TRACE (*)    All other components within normal  limits  URINE MICROSCOPIC-ADD ON    Imaging Review Ct Lumbar Spine Wo Contrast  02/10/2015   CLINICAL DATA:  Left back pain. Fell against a concrete wall. Abrasion along the back. Pain with movement.  EXAM: CT LUMBAR SPINE WITHOUT CONTRAST  TECHNIQUE: Multidetector CT imaging of the lumbar spine was performed without intravenous contrast administration. Multiplanar CT image reconstructions were also generated.  COMPARISON:  None.  FINDINGS: Bilateral nonobstructive nephrolithiasis. Left parapelvic cysts are suspected.  Aortoiliac atherosclerotic vascular disease. Levoconvex rotary scoliosis.  Nondisplaced fractures of the left first and second lumbar vertebral transverse processes.  There is loss of intervertebral disc height at all levels between T12 and S1 with 4 mm retrolisthesis at L1-2 ; 3 mm retrolisthesis at L2-3 and L4-5. Multilevel Schmorl's nodes. No acute lumbar compression fracture. Questionable irregularity along the upper lateral sacral ala may simply be due to spurring rather than necessarily being from a sacral fracture.  On image 7 of series 3, suspicion is raised for a 2.0 by 1.4 cm pancreatic tail lesion. Density 5 Hounsfield units.  Sigmoid diverticulosis. Atrophy of the left piriformis muscle in its visualized portion.  Additional findings at individual levels are as follows: T12-L1: No impingement. Diffuse disc bulge.  L1-2: Mild right foraminal stenosis due to facet arthropathy. Diffuse disc bulge.  L2-3:  No impingement.  Disc osteophyte complex noted.  L3-4: Mild right foraminal stenosis with right subarticular lateral recess stenosis and displacement of the right L3 nerve in the lateral extraforaminal space due to disc bulge and intervertebral spurring along with facet arthropathy. Mild central narrowing of the thecal sac  L4-5: Moderate central narrowing of the thecal sac with mild to moderate left foraminal stenosis and at least mild bilateral subarticular lateral recess stenosis  due to intervertebral spurring, disc bulge, and facet arthropathy.  L5-S1: Mild to moderate left foraminal stenosis due to intervertebral spurring, disc bulge, and facet arthropathy.  IMPRESSION: 1. Acute nondisplaced left transverse process fractures at L1 and L2. 2. Slight irregularity of the upper sacrum, not definitively a fracture. 3. Fluid density 2.0 by 1.4 cm pancreatic tail lesion. Fifth differential diagnostic considerations include postinflammatory cyst or possibly a neoplastic lesion such as intraductal papillary mucinous neoplasm. Typical guidelines would call  for a follow up pancreatic protocol MRI with and without contrast to further characterize. However, this should be taken in the context of the patient's clinical situation and age. Also, given the transverse process fractures, the patient might not be able remain prone for prolonged period of time for MRI in the short term. 4. Lumbar spondylosis and degenerative disc disease causing moderate impingement at L4-5 ; mild to moderate impingement at L5-S1; and mild impingement at L1-2 and L3-4, as detailed above. 5. Other imaging findings of potential clinical significance: Bilateral nonobstructive nephrolithiasis ; Aortoiliac atherosclerotic vascular disease. ; levoconvex rotary scoliosis; and sigmoid diverticulosis.   Electronically Signed   By: Gaylyn Rong M.D.   On: 02/10/2015 11:09     EKG Interpretation None      MDM   Final diagnoses:  Lumbar transverse process fracture, closed, initial encounter  DDD (degenerative disc disease), lumbar    Patients labs and/or radiological studies were reviewed and considered during the medical decision making and disposition process.  Results were also discussed with patient.  Patient was also seen by Dr. Deretha Emory during this visit.  Patient was prescribed hydrocodone as she states she has only a few of her hydrocodone tablets left from her hip surgery.  She was advised to follow-up  with her orthopedic doctor for ongoing management of her injury while this heals.  She was also advised to follow up with her primary doctor for outpatient MRI scheduling to further define the fullness seen in her pancreas.   Burgess Amor, PA-C 02/10/15 1314  Vanetta Mulders, MD 02/10/15 1316

## 2015-02-22 ENCOUNTER — Telehealth: Payer: Self-pay | Admitting: Internal Medicine

## 2015-02-22 NOTE — Telephone Encounter (Signed)
Forward to Dr.Taylor.

## 2015-02-22 NOTE — Telephone Encounter (Signed)
Pt needs an ok from Dr. Ladona Ridgel to have an MRI done, pt does have a pacemaker and she is going to start taking Bystolic again

## 2015-03-01 NOTE — Telephone Encounter (Signed)
Per Janene Harvey , device clinic tech,pts leads are not approved to go through MRI machine,pt aware

## 2015-03-22 ENCOUNTER — Other Ambulatory Visit (HOSPITAL_COMMUNITY): Payer: Self-pay | Admitting: Family Medicine

## 2015-03-22 DIAGNOSIS — R1909 Other intra-abdominal and pelvic swelling, mass and lump: Secondary | ICD-10-CM

## 2015-03-24 ENCOUNTER — Ambulatory Visit (INDEPENDENT_AMBULATORY_CARE_PROVIDER_SITE_OTHER): Payer: Medicare Other | Admitting: Neurology

## 2015-03-24 ENCOUNTER — Encounter: Payer: Self-pay | Admitting: Neurology

## 2015-03-24 VITALS — BP 182/73 | HR 66 | Ht 64.0 in | Wt 155.5 lb

## 2015-03-24 DIAGNOSIS — R269 Unspecified abnormalities of gait and mobility: Secondary | ICD-10-CM

## 2015-03-24 DIAGNOSIS — G252 Other specified forms of tremor: Secondary | ICD-10-CM

## 2015-03-24 DIAGNOSIS — G250 Essential tremor: Secondary | ICD-10-CM

## 2015-03-24 DIAGNOSIS — R251 Tremor, unspecified: Secondary | ICD-10-CM | POA: Diagnosis not present

## 2015-03-24 DIAGNOSIS — G609 Hereditary and idiopathic neuropathy, unspecified: Secondary | ICD-10-CM | POA: Diagnosis not present

## 2015-03-24 NOTE — Progress Notes (Signed)
Reason for visit: Peripheral neuropathy  Pamela [name] is an 81 y.o. female  History of present illness:  Pamela [name] is a 81 year old right-handed white female with a history of a peripheral neuropathy associated with a gait disturbance. The patient fell in July 2016, and fractured the transverse processes on the L1 and L2 vertebra. The patient is still having some discomfort following this fall. She ambulates with a cane, but she fell in the home environment, and she was not using a cane at that time. She has had 2 falls since last seen. She has some discomfort in the left hip following a partial hip replacement, she will get injections into the joint on occasion. The patient returns to this office for further evaluation. She does have some numbness in the feet, she denies any significant discomfort in the feet, however. She is sleeping well, she takes very low-dose gabapentin, 100 mg at night.  Past Medical History  Diagnosis Date  . Pacemaker 08/29/2012    st jude accent DR RF device, model number [id number], serial number [id number], implanted 08/29/2012 for sinus bradycardia, runs of supraventricular tachycardia  last checked 10/23/2012  . ICD (implantable cardiac defibrillator) in place   . SSS (sick sinus syndrome) with PAT and marked bradycardia with 3 sec pauses  08/30/2012  . Status post placement of cardiac pacemaker. 08/29/12 St. Jude device 08/30/2012  . HTN (hypertension) 08/30/2012  . Hyperlipidemia 08/30/2012  . Polyneuropathy in other diseases classified elsewhere   . Macular degeneration   . Coronary artery disease     stent in the LAD artery in 2002  . Venous insufficiency     Past Surgical History  Procedure Laterality Date  . Cardiac catheterization  2006    3 stents placed  . Insert / replace / remove pacemaker  08/29/2012    st jude   . Rotator cuff repair Left   . Abdominal hysterectomy    . Cesarean section    . Kidney stone surgery    . Rotator cuff repair  Right   . Cardiac catheterization  06/2001    placement of BiodivYsio 2.5.10mm stent dilated to 2.37mm in proximal left anterior descenting stenotic lesion  . Radiofrequency ablation  08/25/2010    ablation of left greater saphenous vein  . Hip arthroplasty Left 01/04/2014    Procedure: ARTHROPLASTY BIPOLAR HIP;  Surgeon: Shelda Pal, MD;  Location: [facility] OR;  Service: Orthopedics;  Laterality: Left;  . Permanent pacemaker insertion N/A 08/29/2012    Procedure: PERMANENT PACEMAKER INSERTION;  Surgeon: Thurmon Fair, MD;  Location: [facility] CATH LAB;  Service: Cardiovascular;  Laterality: N/A;    Family History  Problem Relation Age of Onset  . Heart disease Mother   . Congestive Heart Failure Mother   . Heart disease Father   . Heart attack Father   . Heart disease Brother     Social history:  reports that she has never smoked. She has never used smokeless tobacco. She reports that she does not drink alcohol or use illicit drugs.   No Known Allergies  Medications:  Prior to Admission medications   Medication Sig Start Date End Date Taking? Authorizing Provider  acetaminophen (TYLENOL) 325 MG tablet Take 650 mg by mouth every 6 (six) hours as needed for pain.   Yes Historical Provider, MD  acetaminophen (TYLENOL) 500 MG tablet Take 1,000 mg by mouth every 6 (six) hours as needed for moderate pain.   Yes Historical Provider, MD  aspirin  EC 81 MG tablet Take 81 mg by mouth daily.   Yes Historical Provider, MD  atenolol (TENORMIN) 25 MG tablet Take 1 tablet (25 mg total) by mouth daily. 10/23/14  Yes Marinus Maw, MD  atorvastatin (LIPITOR) 10 MG tablet TAKE ONE (1) TABLET BY MOUTH EVERY DAY 08/28/14  Yes Marinus Maw, MD  Cholecalciferol (VITAMIN D-3 PO) Take 1 capsule by mouth daily.   Yes Historical Provider, MD  docusate sodium 100 MG CAPS Take 100 mg by mouth 2 (two) times daily. Patient taking differently: Take 100 mg by mouth as needed.  01/06/14  Yes Nishant Dhungel, MD  ezetimibe  (ZETIA) 10 MG tablet Take 10 mg by mouth daily.   Yes Historical Provider, MD  ferrous sulfate 325 (65 FE) MG tablet Take 325 mg by mouth daily.    Yes Historical Provider, MD  furosemide (LASIX) 20 MG tablet TAKE ONE TABLET BY MOUTH DAILY AS NEEDEDFOR EDEMA 01/22/15  Yes Marinus Maw, MD  gabapentin (NEURONTIN) 100 MG capsule Take 100 mg by mouth at bedtime.   Yes Historical Provider, MD  HYDROcodone-acetaminophen (NORCO/VICODIN) 5-325 MG per tablet Take 1 tablet by mouth every 4 (four) hours as needed. 02/10/15  Yes Burgess Amor, PA-C  l-methylfolate-B6-B12 (METANX) 3-35-2 MG TABS TAKE ONE TABLET TWICE DAILY 01/21/15  Yes York Spaniel, MD  Multiple Vitamins-Minerals (OCUVITE PO) Take 1 tablet by mouth 2 (two) times daily.   Yes Historical Provider, MD  Polyethyl Glycol-Propyl Glycol (SYSTANE OP) Apply 1 drop to eye 2 (two) times daily as needed (Dry Eyes).    Yes Historical Provider, MD  ramipril (ALTACE) 2.5 MG capsule Take 1 capsule (2.5 mg total) by mouth daily. 09/30/14  Yes Marinus Maw, MD  vitamin B-12 (CYANOCOBALAMIN) 1000 MCG tablet Take 1,000 mcg by mouth daily.   Yes Historical Provider, MD    ROS:  Out of a complete 14 system review of symptoms, the patient complains only of the following symptoms, and all other reviewed systems are negative.  Decreased vision, macular degeneration Leg swelling Back pain, walking difficulty  Blood pressure 182/73, pulse 66, height 5\' 4"  (1.626 m), weight 155 lb 8 oz (70.534 kg).  Physical Exam  General: The patient is alert and cooperative at the time of the examination.  Skin: 1+ edema at the ankles is noted bilaterally.   Neurologic Exam  Mental status: The patient is alert and oriented x 3 at the time of the examination. The patient has apparent normal recent and remote memory, with an apparently normal attention span and concentration ability.   Cranial nerves: Facial symmetry is present. Speech is normal, no aphasia or dysarthria  is noted. Extraocular movements are full. Visual fields are full.  Motor: The patient has good strength in all 4 extremities.  Sensory examination: Soft touch sensation is symmetric on the face, arms, and legs.  Coordination: The patient has good finger-nose-finger and heel-to-shin bilaterally.  Gait and station: The patient has a slightly wide-based, unsteady gait. The patient uses a cane for ambulation. Tandem gait was not attempted. Romberg is negative. No drift is seen.  Reflexes: Deep tendon reflexes are symmetric, but are depressed.   Assessment/Plan:  1. Peripheral neuropathy  2. Gait disturbance  The patient lives alone currently, she does use a cane for ambulation outside of the house. She continues to operate a motor vehicle. I will set her up for physical therapy evaluation at [facility]. The patient will continue the gabapentin, she will  follow-up in 9 months for an evaluation.  Marlan Palau MD 03/24/2015 7:33 PM  [facility] [address] [place], [place] [address]  Phone [phone] Fax [phone]

## 2015-03-24 NOTE — Patient Instructions (Addendum)
We will set you up for physical therapy to work on balance and safety issues.   Fall Prevention and Home Safety Falls cause injuries and can affect all age groups. It is possible to use preventive measures to significantly decrease the likelihood of falls. There are many simple measures which can make your home safer and prevent falls. OUTDOORS  Repair cracks and edges of walkways and driveways.  Remove high doorway thresholds.  Trim shrubbery on the main path into your home.  Have good outside lighting.  Clear walkways of tools, rocks, debris, and clutter.  Check that handrails are not broken and are securely fastened. Both sides of steps should have handrails.  Have leaves, snow, and ice cleared regularly.  Use sand or salt on walkways during winter months.  In the garage, clean up grease or oil spills. BATHROOM  Install night lights.  Install grab bars by the toilet and in the tub and shower.  Use non-skid mats or decals in the tub or shower.  Place a plastic non-slip stool in the shower to sit on, if needed.  Keep floors dry and clean up all water on the floor immediately.  Remove soap buildup in the tub or shower on a regular basis.  Secure bath mats with non-slip, double-sided rug tape.  Remove throw rugs and tripping hazards from the floors. BEDROOMS  Install night lights.  Make sure a bedside light is easy to reach.  Do not use oversized bedding.  Keep a telephone by your bedside.  Have a firm chair with side arms to use for getting dressed.  Remove throw rugs and tripping hazards from the floor. KITCHEN  Keep handles on pots and pans turned toward the center of the stove. Use back burners when possible.  Clean up spills quickly and allow time for drying.  Avoid walking on wet floors.  Avoid hot utensils and knives.  Position shelves so they are not too high or low.  Place commonly used objects within easy reach.  If necessary, use a sturdy  step stool with a grab bar when reaching.  Keep electrical cables out of the way.  Do not use floor polish or wax that makes floors slippery. If you must use wax, use non-skid floor wax.  Remove throw rugs and tripping hazards from the floor. STAIRWAYS  Never leave objects on stairs.  Place handrails on both sides of stairways and use them. Fix any loose handrails. Make sure handrails on both sides of the stairways are as long as the stairs.  Check carpeting to make sure it is firmly attached along stairs. Make repairs to worn or loose carpet promptly.  Avoid placing throw rugs at the top or bottom of stairways, or properly secure the rug with carpet tape to prevent slippage. Get rid of throw rugs, if possible.  Have an electrician put in a light switch at the top and bottom of the stairs. OTHER FALL PREVENTION TIPS  Wear low-heel or rubber-soled shoes that are supportive and fit well. Wear closed toe shoes.  When using a stepladder, make sure it is fully opened and both spreaders are firmly locked. Do not climb a closed stepladder.  Add color or contrast paint or tape to grab bars and handrails in your home. Place contrasting color strips on first and last steps.  Learn and use mobility aids as needed. Install an electrical emergency response system.  Turn on lights to avoid dark areas. Replace light bulbs that burn out immediately. Get  light switches that glow.  Arrange furniture to create clear pathways. Keep furniture in the same place.  Firmly attach carpet with non-skid or double-sided tape.  Eliminate uneven floor surfaces.  Select a carpet pattern that does not visually hide the edge of steps.  Be aware of all pets. OTHER HOME SAFETY TIPS  Set the water temperature for 120 F (48.8 C).  Keep emergency numbers on or near the telephone.  Keep smoke detectors on every level of the home and near sleeping areas. Document Released: 07/07/2002 Document Revised:  01/16/2012 Document Reviewed: 10/06/2011 [facility] Patient Information 2015 [place], [place]. This information is not intended to replace advice given to you by your health care provider. Make sure you discuss any questions you have with your health care provider.

## 2015-03-25 ENCOUNTER — Other Ambulatory Visit (HOSPITAL_COMMUNITY): Payer: Self-pay | Admitting: Family Medicine

## 2015-03-25 ENCOUNTER — Ambulatory Visit (HOSPITAL_COMMUNITY)
Admission: RE | Admit: 2015-03-25 | Discharge: 2015-03-25 | Disposition: A | Discharge status: Home / Self Care | Payer: Medicare Other | Source: Ambulatory Visit | Attending: Family Medicine | Admitting: Family Medicine

## 2015-03-25 DIAGNOSIS — R1909 Other intra-abdominal and pelvic swelling, mass and lump: Secondary | ICD-10-CM

## 2015-03-25 DIAGNOSIS — K862 Cyst of pancreas: Secondary | ICD-10-CM | POA: Diagnosis present

## 2015-03-25 LAB — POCT I-STAT CREATININE: CREATININE: 0.9 mg/dL (ref 0.44–1.00)

## 2015-03-25 MED ORDER — IOHEXOL 300 MG/ML  SOLN
100.0000 mL | Freq: Once | INTRAMUSCULAR | Status: AC | PRN
  Administered 2015-03-25: 100 mL via INTRAVENOUS

## 2015-04-30 ENCOUNTER — Ambulatory Visit (INDEPENDENT_AMBULATORY_CARE_PROVIDER_SITE_OTHER): Payer: Medicare Other | Admitting: Internal Medicine

## 2015-04-30 ENCOUNTER — Encounter: Payer: Self-pay | Admitting: Internal Medicine

## 2015-04-30 VITALS — BP 134/74 | HR 64 | Ht 64.5 in | Wt 154.6 lb

## 2015-04-30 DIAGNOSIS — I495 Sick sinus syndrome: Secondary | ICD-10-CM | POA: Diagnosis not present

## 2015-04-30 DIAGNOSIS — I259 Chronic ischemic heart disease, unspecified: Secondary | ICD-10-CM

## 2015-04-30 DIAGNOSIS — I471 Supraventricular tachycardia: Secondary | ICD-10-CM

## 2015-04-30 DIAGNOSIS — I10 Essential (primary) hypertension: Secondary | ICD-10-CM | POA: Diagnosis not present

## 2015-04-30 LAB — CUP PACEART INCLINIC DEVICE CHECK
Battery Voltage: 3.01 V
Brady Statistic RA Percent Paced: 35 %
Brady Statistic RV Percent Paced: 0.16 %
Lead Channel Impedance Value: 375 Ohm
Lead Channel Impedance Value: 412.5 Ohm
Lead Channel Pacing Threshold Amplitude: 0.625 V
Lead Channel Pacing Threshold Pulse Width: 0.4 ms
Lead Channel Pacing Threshold Pulse Width: 0.4 ms
Lead Channel Sensing Intrinsic Amplitude: 2.8 mV
Lead Channel Sensing Intrinsic Amplitude: 4.5 mV
Lead Channel Setting Pacing Amplitude: 1.5 V
Lead Channel Setting Sensing Sensitivity: 2 mV
MDC IDC MSMT BATTERY REMAINING LONGEVITY: 142.8 mo
MDC IDC MSMT LEADCHNL RA PACING THRESHOLD AMPLITUDE: 0.5 V
MDC IDC SESS DTM: 20160930124745
MDC IDC SET LEADCHNL RV PACING AMPLITUDE: 0.875
MDC IDC SET LEADCHNL RV PACING PULSEWIDTH: 0.4 ms
Pulse Gen Model: 2210
Pulse Gen Serial Number: 7437302

## 2015-04-30 MED ORDER — CARVEDILOL 3.125 MG PO TABS: 3.1250 mg | ORAL_TABLET | Freq: Two times a day (BID) | ORAL | Status: DC

## 2015-04-30 NOTE — Patient Instructions (Addendum)
Your physician wants you to follow-up in: 1 Year with Dr. Ladona Ridgel. You will receive a reminder letter in the mail two months in advance. If you don't receive a letter, please call our office to schedule the follow-up appointment.  Remote monitoring is used to monitor your Pacemaker of ICD from home. This monitoring reduces the number of office visits required to check your device to one time per year. It allows [place] to keep an eye on the functioning of your device to ensure it is working properly. You are scheduled for a device check from home on 08/03/15. You may send your transmission at any time that day. If you have a wireless device, the transmission will be sent automatically. After your physician reviews your transmission, you will receive a postcard with your next transmission date.  Your physician has recommended you make the following change in your medication:   Complete current supply of Bystolic.  After completing Bystolic Start Carvedilol 3.125 Two times Daily   Thank you for choosing [facility]!

## 2015-04-30 NOTE — Progress Notes (Signed)
HPI [name] returns today for followup. She is a pleasant 81 yo woman with symptomatic tachybrady syndrome and pauses s/p PPM insertion. She has been bothered by her hip. She had undergone partial hip replacement over a year ago, and required 2months in the [facility]. She is considering a complete hip replacement. She has questions about her blood pressure medications. She had been on both atenolol and metoprolol in the past and had problems with fatigue. She has done better with bystolic. I do not think she has been tried on coreg.   No Known Allergies   Current Outpatient Prescriptions  Medication Sig Dispense Refill  . acetaminophen (TYLENOL) 325 MG tablet Take 650 mg by mouth every 6 (six) hours as needed for pain.    Marland Kitchen acetaminophen (TYLENOL) 500 MG tablet Take 1,000 mg by mouth every 6 (six) hours as needed for moderate pain.    Marland Kitchen aspirin EC 81 MG tablet Take 81 mg by mouth daily.    Marland Kitchen atorvastatin (LIPITOR) 10 MG tablet TAKE ONE (1) TABLET BY MOUTH EVERY DAY 30 tablet 11  . Cholecalciferol (VITAMIN D-3 PO) Take 1 capsule by mouth daily.    Marland Kitchen docusate sodium 100 MG CAPS Take 100 mg by mouth 2 (two) times daily. (Patient taking differently: Take 100 mg by mouth as needed. ) 10 capsule 0  . ezetimibe (ZETIA) 10 MG tablet Take 10 mg by mouth daily.    . ferrous sulfate 325 (65 FE) MG tablet Take 325 mg by mouth daily.     . furosemide (LASIX) 20 MG tablet TAKE ONE TABLET BY MOUTH DAILY AS NEEDEDFOR EDEMA 30 tablet 2  . gabapentin (NEURONTIN) 100 MG capsule Take 100 mg by mouth at bedtime.    Marland Kitchen l-methylfolate-B6-B12 (METANX) 3-35-2 MG TABS TAKE ONE TABLET TWICE DAILY 60 tablet 3  . Multiple Vitamins-Minerals (OCUVITE PO) Take 1 tablet by mouth 2 (two) times daily.    Bertram Gala Glycol-Propyl Glycol (SYSTANE OP) Apply 1 drop to eye 2 (two) times daily as needed (Dry Eyes).     . ramipril (ALTACE) 2.5 MG capsule Take 1 capsule (2.5 mg total) by mouth daily. 30 capsule 6  .  vitamin B-12 (CYANOCOBALAMIN) 1000 MCG tablet Take 1,000 mcg by mouth daily.     No current facility-administered medications for this visit.     Past Medical History  Diagnosis Date  . Pacemaker 08/29/2012    st jude accent DR RF device, model number [id number], serial number [id number], implanted 08/29/2012 for sinus bradycardia, runs of supraventricular tachycardia  last checked 10/23/2012  . ICD (implantable cardiac defibrillator) in place   . SSS (sick sinus syndrome) with PAT and marked bradycardia with 3 sec pauses  08/30/2012  . Status post placement of cardiac pacemaker. 08/29/12 St. Jude device 08/30/2012  . HTN (hypertension) 08/30/2012  . Hyperlipidemia 08/30/2012  . Polyneuropathy in other diseases classified elsewhere   . Macular degeneration   . Coronary artery disease     stent in the LAD artery in 2002  . Venous insufficiency     ROS:   All systems reviewed and negative except as noted in the HPI.   Past Surgical History  Procedure Laterality Date  . Cardiac catheterization  2006    3 stents placed  . Insert / replace / remove pacemaker  08/29/2012    st jude   . Rotator cuff repair Left   . Abdominal hysterectomy    . Cesarean section    .  Kidney stone surgery    . Rotator cuff repair Right   . Cardiac catheterization  06/2001    placement of BiodivYsio 2.5.10mm stent dilated to 2.108mm in proximal left anterior descenting stenotic lesion  . Radiofrequency ablation  08/25/2010    ablation of left greater saphenous vein  . Hip arthroplasty Left 01/04/2014    Procedure: ARTHROPLASTY BIPOLAR HIP;  Surgeon: Shelda Pal, MD;  Location: [facility];  Service: Orthopedics;  Laterality: Left;  . Permanent pacemaker insertion N/A 08/29/2012    Procedure: PERMANENT PACEMAKER INSERTION;  Surgeon: Thurmon Fair, MD;  Location: [facility] CATH LAB;  Service: Cardiovascular;  Laterality: N/A;     Family History  Problem Relation Age of Onset  . Heart disease Mother   . Congestive Heart  Failure Mother   . Heart disease Father   . Heart attack Father   . Heart disease Brother      Social History   Social History  . Marital Status: Widowed    Spouse Name: N/A  . Number of Children: 2  . Years of Education: N/A   Occupational History  .     Social History Main Topics  . Smoking status: Never Smoker   . Smokeless tobacco: Never Used  . Alcohol Use: No  . Drug Use: No  . Sexual Activity: Not on file   Other Topics Concern  . Not on file   Social History Narrative   Patient drinks about 2 cups of caffeine daily.   Patient is right handed.     BP 134/74 mmHg  Pulse 64  Ht 5' 4.5" (1.638 m)  Wt 154 lb 9.6 oz (70.126 kg)  BMI 26.14 kg/m2  SpO2 99%  Physical Exam:  Well appearing elderly woman, NAD HEENT: Unremarkable Neck:  No JVD, no thyromegally Back:  No CVA tenderness Lungs:  Clear with no wheezes HEART:  Regular rate rhythm, no murmurs, no rubs, no clicks Abd:  soft, positive bowel sounds, no organomegally, no rebound, no guarding Ext:  2 plus pulses, trace peripheral edema in the left leg, no cyanosis, no clubbing Skin:  No rashes no nodules Neuro:  CN II through XII intact, motor grossly intact  DEVICE  Normal device function.  See PaceArt for details.   Assess/Plan:

## 2015-04-30 NOTE — Assessment & Plan Note (Signed)
She has had rare asymptomatic episodes by device interogation. Will follow.

## 2015-04-30 NOTE — Assessment & Plan Note (Signed)
Her pressures have been reasonably well controlled. However, her insurance is unwilling to pay for bystolic. Will try coreg. If she cannot tolerate then she will have to take bystolic.

## 2015-04-30 NOTE — Assessment & Plan Note (Signed)
Her St. Jude DDD PM is working normally. Will recheck in several months.  

## 2015-04-30 NOTE — Assessment & Plan Note (Signed)
She has been more sedentary but denies any anginal symptoms. Will follow.

## 2015-05-13 ENCOUNTER — Ambulatory Visit (HOSPITAL_COMMUNITY): Payer: Medicare Other | Attending: Neurology | Admitting: Physical Therapy

## 2015-05-13 DIAGNOSIS — R29898 Other symptoms and signs involving the musculoskeletal system: Secondary | ICD-10-CM | POA: Insufficient documentation

## 2015-05-13 DIAGNOSIS — R6889 Other general symptoms and signs: Secondary | ICD-10-CM | POA: Diagnosis present

## 2015-05-13 DIAGNOSIS — Z9181 History of falling: Secondary | ICD-10-CM | POA: Insufficient documentation

## 2015-05-13 NOTE — Patient Instructions (Signed)
Bridging    Slowly raise buttocks from floor, keeping stomach tight. Repeat _10___ times per set. Do __1__ sets per session. Do __1__ sessions per day.  http://orth.exer.us/1097   Copyright  VHI. All rights reserved.  Straight Leg Raise    Tighten stomach and slowly raise locked right leg __15-18__ inches from floor. Repeat __10__ times per set. Do __1__ sets per session. Do _1___ sessions per day.  http://orth.exer.us/1103   Copyright  VHI. All rights reserved.  HIP: Abduction - Standing    Squeeze glutes. Raise leg out and slightly back. __10_ reps per set, _1__ sets per day. Hold onto a support.  Copyright  VHI. All rights reserved.

## 2015-05-13 NOTE — Therapy (Signed)
[facility] [address] [place], [place], [address] Phone: [phone]   Fax:  [phone]  Physical Therapy Evaluation  Patient Details  Name: Pamela [name] MRN: [id number] Date of Birth: [birth year] Referring Provider:  York Spaniel, MD  Encounter Date: 05/13/2015      PT End of Session - 05/13/15 1217    Visit Number 1   Number of Visits 12   Date for PT Re-Evaluation 06/11/15   Authorization Type BCBS Medicare   Authorization Time Period 05/13/15-07/09/15   Authorization - Visit Number 1   Authorization - Number of Visits 10   PT Start Time 1045   PT Stop Time 1130   PT Time Calculation (min) 45 min   Equipment Utilized During Treatment Gait belt   Activity Tolerance Patient tolerated treatment well   Behavior During Therapy [facility] for tasks assessed/performed      Past Medical History  Diagnosis Date  . Pacemaker 08/29/2012    st jude accent DR RF device, model number [id number], serial number [id number], implanted 08/29/2012 for sinus bradycardia, runs of supraventricular tachycardia  last checked 10/23/2012  . ICD (implantable cardiac defibrillator) in place   . SSS (sick sinus syndrome) with PAT and marked bradycardia with 3 sec pauses  08/30/2012  . Status post placement of cardiac pacemaker. 08/29/12 St. Jude device 08/30/2012  . HTN (hypertension) 08/30/2012  . Hyperlipidemia 08/30/2012  . Polyneuropathy in other diseases classified elsewhere   . Macular degeneration   . Coronary artery disease     stent in the LAD artery in 2002  . Venous insufficiency     Past Surgical History  Procedure Laterality Date  . Cardiac catheterization  2006    3 stents placed  . Insert / replace / remove pacemaker  08/29/2012    st jude   . Rotator cuff repair Left   . Abdominal hysterectomy    . Cesarean section    . Kidney stone surgery    . Rotator cuff repair Right   . Cardiac catheterization  06/2001    placement of  BiodivYsio 2.5.10mm stent dilated to 2.56mm in proximal left anterior descenting stenotic lesion  . Radiofrequency ablation  08/25/2010    ablation of left greater saphenous vein  . Hip arthroplasty Left 01/04/2014    Procedure: ARTHROPLASTY BIPOLAR HIP;  Surgeon: Shelda Pal, MD;  Location: [facility] OR;  Service: Orthopedics;  Laterality: Left;  . Permanent pacemaker insertion N/A 08/29/2012    Procedure: PERMANENT PACEMAKER INSERTION;  Surgeon: Thurmon Fair, MD;  Location: [facility] CATH LAB;  Service: Cardiovascular;  Laterality: N/A;    There were no vitals filed for this visit.  Visit Diagnosis:  Risk for falls  Weakness of both legs  Decreased functional activity tolerance      Subjective Assessment - 05/13/15 1049    Subjective Pt reports that she has been having weakness in her left leg. She has had a couple of recent falls 2 months after losing her balance when her L leg gave way. She has been using a SPC on and off since her hip surgery last June. At this point, she feels that her L leg weakness is her biggest issue. She knows that her balance is also impaired.    Patient Stated Goals Strengthen LLE, improve balance   Multiple Pain Sites No            [facility] PT Assessment - 05/13/15 0001    Assessment   Medical  Diagnosis LLE weakness, fall risk   Next MD Visit appt once per year with Dr. Anne HahnWillis   Prior Therapy yes, for strengthening but not balance   Balance Screen   Has the patient fallen in the past 6 months Yes   How many times? 2   Has the patient had a decrease in activity level because of a fear of falling?  No   Is the patient reluctant to leave their home because of a fear of falling?  No   Home Environment   Living Environment Private residence   Living Arrangements Alone   Type of Home House   Home Access Stairs to enter   Entrance Stairs-Number of Steps 3   Entrance Stairs-Rails Right   Home Layout One level   Prior Function   Level of Independence Independent    Vocation Retired   Leisure does not participate in man activities due to vision problems   Observation/Other Assessments   Focus on Therapeutic Outcomes (FOTO)  49% limited   Functional Tests   Functional tests Single leg stance   Single Leg Stance   Comments max of 2" bilaterally   ROM / Strength   AROM / PROM / Strength Strength   Strength   Strength Assessment Site Hip;Knee   Right/Left Hip Right;Left   Right Hip Flexion 3+/5   Right Hip Extension 2+/5   Right Hip ABduction 3+/5   Left Hip Flexion 3+/5   Left Hip Extension 2+/5   Left Hip ABduction 3/5   Right/Left Knee Right;Left   Right Knee Flexion 4/5   Right Knee Extension 4/5   Left Knee Flexion 4/5   Left Knee Extension 4/5   Transfers   Five time sit to stand comments  29.43   Ambulation/Gait   Gait Comments TUG: with SPC 25.42", without AD 24.30"   6 minute walk test results    Endurance additional comments 3 minute walk test:  279 ft  .47 m/s   Balance   Balance Assessed Yes   Standardized Balance Assessment   Standardized Balance Assessment Berg Balance Test   Berg Balance Test   Sit to Stand Able to stand without using hands and stabilize independently   Standing Unsupported Able to stand safely 2 minutes   Sitting with Back Unsupported but Feet Supported on Floor or Stool Able to sit safely and securely 2 minutes   Stand to Sit Sits safely with minimal use of hands   Transfers Able to transfer safely, minor use of hands   Standing Unsupported with Eyes Closed Able to stand 10 seconds safely   Standing Ubsupported with Feet Together Able to place feet together independently and stand 1 minute safely   From Standing, Reach Forward with Outstretched Arm Can reach forward >12 cm safely (5")   From Standing Position, Pick up Object from Floor Able to pick up shoe safely and easily   From Standing Position, Turn to Look Behind Over each Shoulder Looks behind from both sides and weight shifts well   Turn 360  Degrees Able to turn 360 degrees safely one side only in 4 seconds or less   Standing Unsupported, Alternately Place Feet on Step/Stool Able to stand independently and complete 8 steps >20 seconds   Standing Unsupported, One Foot in Front Able to take small step independently and hold 30 seconds   Standing on One Leg Tries to lift leg/unable to hold 3 seconds but remains standing independently   Total Score 48  PT Education - 08-Jun-2015 1214    Education provided Yes   Education Details HEP, prognosis   Person(s) Educated Patient   Methods Explanation;Handout   Comprehension Verbalized understanding;Returned demonstration          PT Short Term Goals - 2015-06-08 1224    PT SHORT TERM GOAL #1   Title Pt will be independent with HEP.   Time 3   Period Weeks   Status New   PT SHORT TERM GOAL #2   Title Pt will complete TUG in 20 seconds or less with SPC to demonstrate decreased fall risk.   Time 3   Period Weeks   Status New   PT SHORT TERM GOAL #3   Title Pt will complete five time sit to stand in 23 seconds to demonstrate improved BLE strength and power.    Time 3   Period Weeks   Status New   PT SHORT TERM GOAL #4   Title Pt will ambulate 350 feet or greater during 3 minute walk test to demonstrate improved gait speed and functional activity tolerance.    Time 3   Period Weeks   Status New           PT Long Term Goals - 06-08-2015 1226    PT LONG TERM GOAL #1   Title Pt will complete TUG in 15 seconds or less without AD to demonstrate decreased fall risk.   Time 6   Period Weeks   Status New   PT LONG TERM GOAL #2   Title Pt will complete five time sit to stand in 18 seconds or less to demonstrate improved BLE strength and power.    Time 6   Period Weeks   Status New   PT LONG TERM GOAL #3   Title Pt will ambulate 450 feet during 3 minute walk test to demonstrate improved gait speed and functional activity tolerance.    Time 6   Period  Weeks   Status New   PT LONG TERM GOAL #4   Title Pt will maintain SLS x 15 seconds bilaterally to decrease fall risk.    Time 6   Period Weeks   Status New               Plan - 2015-06-08 1220    Clinical Impression Statement Pt presents to PT with c/o leg weakness and recent falls. Upon examination, pt demonstrates the following impairments:  decreased BLE strength, decreased gait speed, impaired functional activity tolerance, and risk for falls. Pt will benefit from skilled physical therapy at this time to improve her BLE strength, improve balance, and increase gait speed to reduce risk for falls and to return pt to PLOF.    Pt will benefit from skilled therapeutic intervention in order to improve on the following deficits Decreased activity tolerance;Decreased balance;Decreased endurance;Decreased strength;Difficulty walking   Rehab Potential Good   PT Frequency 2x / week   PT Duration 6 weeks   PT Treatment/Interventions ADLs/Self Care Home Management;Gait training;Stair training;Functional mobility training;Therapeutic activities;Therapeutic exercise;Balance training;Neuromuscular re-education;Patient/family education   PT Next Visit Plan Review goals and HEP, continue with functional strengthening, begin balance training          G-Codes - 06/08/2015 1228    Functional Assessment Tool Used FOTO   Functional Limitation Mobility: Walking and moving around   Mobility: Walking and Moving Around Current Status ([id number]) At least 40 percent but less than 60 percent impaired, limited or restricted   Mobility: Walking  and Moving Around Goal Status [phone]) At least 40 percent but less than 60 percent impaired, limited or restricted       Problem List Patient Active Problem List   Diagnosis Date Noted  . DVT (deep venous thrombosis) (HCC) 01/30/2014  . Occult blood positive stool 01/30/2014  . Thrombocytopenia, unspecified (HCC) 01/06/2014  . Anemia, iron deficiency 01/06/2014  .  Hip fracture, left (HCC) 01/03/2014  . QT prolongation 01/03/2014  . Atherosclerotic cardiovascular disease 08/13/2013  . Hypertensive emergency 04/08/2013  . Dizzy 04/08/2013  . PAT (paroxysmal atrial tachycardia) (HCC) 12/25/2012  . SSS (sick sinus syndrome) with PAT and marked bradycardia with 3 sec pauses  08/30/2012  . Status post placement of cardiac pacemaker. 08/29/12 St. Jude device 08/30/2012  . HTN (hypertension) 08/30/2012  . Hyperlipidemia 08/30/2012  . Osteoporosis, unspecified 04/19/2012  . Essential and other specified forms of tremor 04/19/2012  . Unspecified essential hypertension 04/19/2012  . Cardiac dysrhythmia, unspecified 04/19/2012  . Chronic ischemic heart disease 04/19/2012  . Hereditary and idiopathic peripheral neuropathy 04/19/2012  . Memory loss 04/19/2012  . Disturbance of skin sensation 04/19/2012  . Pain in joint, shoulder region 11/20/2011  . Status post complete repair of rotator cuff 11/20/2011  . Muscle weakness (generalized) 11/20/2011    Leona Singleton, PT, DPT [id number] 05/13/2015, 1:03 PM  [facility] [address] [place], [place], [address] Phone: [phone]   Fax:  [phone]

## 2015-05-18 ENCOUNTER — Ambulatory Visit (HOSPITAL_COMMUNITY): Payer: Medicare Other

## 2015-05-18 DIAGNOSIS — R6889 Other general symptoms and signs: Secondary | ICD-10-CM

## 2015-05-18 DIAGNOSIS — Z9181 History of falling: Secondary | ICD-10-CM | POA: Diagnosis not present

## 2015-05-18 DIAGNOSIS — R29898 Other symptoms and signs involving the musculoskeletal system: Secondary | ICD-10-CM

## 2015-05-18 NOTE — Therapy (Signed)
[facility] [address] [place], [place], [address] Phone: [phone]   Fax:  [phone]  Physical Therapy Treatment  Patient Details  Name: Pamela [name] MRN: [id number] Date of Birth: [birth year] No Data Recorded  Encounter Date: 05/18/2015      PT End of Session - 05/18/15 1024    Visit Number 2   Number of Visits 12   Date for PT Re-Evaluation 06/11/15   Authorization Type BCBS Medicare   Authorization Time Period 05/13/15-07/09/15   Authorization - Visit Number 2   Authorization - Number of Visits 10   PT Start Time 1018   PT Stop Time 1103   PT Time Calculation (min) 45 min   Equipment Utilized During Treatment Gait belt   Activity Tolerance Patient tolerated treatment well   Behavior During Therapy [facility] for tasks assessed/performed      Past Medical History  Diagnosis Date  . Pacemaker 08/29/2012    st jude accent DR RF device, model number [id number], serial number [id number], implanted 08/29/2012 for sinus bradycardia, runs of supraventricular tachycardia  last checked 10/23/2012  . ICD (implantable cardiac defibrillator) in place   . SSS (sick sinus syndrome) with PAT and marked bradycardia with 3 sec pauses  08/30/2012  . Status post placement of cardiac pacemaker. 08/29/12 St. Jude device 08/30/2012  . HTN (hypertension) 08/30/2012  . Hyperlipidemia 08/30/2012  . Polyneuropathy in other diseases classified elsewhere   . Macular degeneration   . Coronary artery disease     stent in the LAD artery in 2002  . Venous insufficiency     Past Surgical History  Procedure Laterality Date  . Cardiac catheterization  2006    3 stents placed  . Insert / replace / remove pacemaker  08/29/2012    st jude   . Rotator cuff repair Left   . Abdominal hysterectomy    . Cesarean section    . Kidney stone surgery    . Rotator cuff repair Right   . Cardiac catheterization  06/2001    placement of BiodivYsio 2.5.10mm stent dilated to  2.22mm in proximal left anterior descenting stenotic lesion  . Radiofrequency ablation  08/25/2010    ablation of left greater saphenous vein  . Hip arthroplasty Left 01/04/2014    Procedure: ARTHROPLASTY BIPOLAR HIP;  Surgeon: Shelda Pal, MD;  Location: [facility];  Service: Orthopedics;  Laterality: Left;  . Permanent pacemaker insertion N/A 08/29/2012    Procedure: PERMANENT PACEMAKER INSERTION;  Surgeon: Thurmon Fair, MD;  [facility] CATH LAB;  Service: Cardiovascular;  Laterality: N/A;    There were no vitals filed for this visit.  Visit Diagnosis:  Risk for falls  Weakness of both legs  Decreased functional activity tolerance      Subjective Assessment - 05/18/15 1023    Subjective Pt stated she has been compliant with HEP with no questions.  Currently pain free though has taken Tylonel prior session today.   Patient Stated Goals Strengthen LLE, improve balance   Currently in Pain? No/denies            [facility] Adult PT Treatment/Exercise - 05/18/15 0001    Exercises   Exercises Knee/Hip   Knee/Hip Exercises: Standing   Heel Raises Both;10 reps   Heel Raises Limitations Toe raises   Knee Flexion Both;10 reps   Hip Abduction Both;10 reps   Abduction Limitations tactile cueing for form   Functional Squat 10 reps   Functional Squat Limitations cueing  for form   Gait Training Gait training with cueing to increase stride length and heel to toe pattern with CGA, no AD   Other Standing Knee Exercises Tandem stance 2x 30"   Knee/Hip Exercises: Supine   Bridges 10 reps   Straight Leg Raises Both;10 reps   Knee/Hip Exercises: Sidelying   Hip ABduction Both;10 reps   Hip ABduction Limitations tactile cueing for form             PT Short Term Goals - 05/13/15 1224    PT SHORT TERM GOAL #1   Title Pt will be independent with HEP.   Time 3   Period Weeks   Status New   PT SHORT TERM GOAL #2   Title Pt will complete TUG in 20 seconds or less with SPC to demonstrate  decreased fall risk.   Time 3   Period Weeks   Status New   PT SHORT TERM GOAL #3   Title Pt will complete five time sit to stand in 23 seconds to demonstrate improved BLE strength and power.    Time 3   Period Weeks   Status New   PT SHORT TERM GOAL #4   Title Pt will ambulate 350 feet or greater during 3 minute walk test to demonstrate improved gait speed and functional activity tolerance.    Time 3   Period Weeks   Status New           PT Long Term Goals - 05/13/15 1226    PT LONG TERM GOAL #1   Title Pt will complete TUG in 15 seconds or less without AD to demonstrate decreased fall risk.   Time 6   Period Weeks   Status New   PT LONG TERM GOAL #2   Title Pt will complete five time sit to stand in 18 seconds or less to demonstrate improved BLE strength and power.    Time 6   Period Weeks   Status New   PT LONG TERM GOAL #3   Title Pt will ambulate 450 feet during 3 minute walk test to demonstrate improved gait speed and functional activity tolerance.    Time 6   Period Weeks   Status New   PT LONG TERM GOAL #4   Title Pt will maintain SLS x 15 seconds bilaterally to decrease fall risk.    Time 6   Period Weeks   Status New               Plan - 05/18/15 1027    Clinical Impression Statement Reviewed goals, compliance with HEP and copy of evaluation given to session. Gait training complete to improve mechanics with cueing for increased stride length and heel to toe pattern with min guard required and no LOB episodes with no AD.  End of session pt. given SPC to leave with and noted decreased stride length with AD, cueing to increase with and without AD.  Pt encouraged to utilize [facility] to reduce risk of falls.  Therapist facilitation required for proper form and technique with all functional strengthening standing exercises for maximal benefits this session.  Reviewed form with HEP, able to demosntrate appropraite techniques with no cueing required.  No reports of  pain through session.     PT Next Visit Plan Continue with functional strengthening and progress balance training PRN.        Problem List Patient Active Problem List   Diagnosis Date Noted  . DVT (deep venous thrombosis) (  HCC) 01/30/2014  . Occult blood positive stool 01/30/2014  . Thrombocytopenia, unspecified (HCC) 01/06/2014  . Anemia, iron deficiency 01/06/2014  . Hip fracture, left (HCC) 01/03/2014  . QT prolongation 01/03/2014  . Atherosclerotic cardiovascular disease 08/13/2013  . Hypertensive emergency 04/08/2013  . Dizzy 04/08/2013  . PAT (paroxysmal atrial tachycardia) (HCC) 12/25/2012  . SSS (sick sinus syndrome) with PAT and marked bradycardia with 3 sec pauses  08/30/2012  . Status post placement of cardiac pacemaker. 08/29/12 St. Jude device 08/30/2012  . HTN (hypertension) 08/30/2012  . Hyperlipidemia 08/30/2012  . Osteoporosis, unspecified 04/19/2012  . Essential and other specified forms of tremor 04/19/2012  . Unspecified essential hypertension 04/19/2012  . Cardiac dysrhythmia, unspecified 04/19/2012  . Chronic ischemic heart disease 04/19/2012  . Hereditary and idiopathic peripheral neuropathy 04/19/2012  . Memory loss 04/19/2012  . Disturbance of skin sensation 04/19/2012  . Pain in joint, shoulder region 11/20/2011  . Status post complete repair of rotator cuff 11/20/2011  . Muscle weakness (generalized) 11/20/2011   Becky Saxasey Cockerham, LPTA; CBIS [phone]  Juel BurrowCockerham, Casey Jo 05/18/2015, 5:44 PM  [facility] [address] [place], [place], [address] Phone: [phone]   Fax:  [phone]  Name: Pamela [name] MRN: [id number] Date of Birth: 07-11-1921

## 2015-05-20 ENCOUNTER — Encounter (HOSPITAL_COMMUNITY): Payer: Medicare Other

## 2015-05-25 ENCOUNTER — Ambulatory Visit (HOSPITAL_COMMUNITY): Payer: Medicare Other

## 2015-05-25 DIAGNOSIS — R29898 Other symptoms and signs involving the musculoskeletal system: Secondary | ICD-10-CM

## 2015-05-25 DIAGNOSIS — Z9181 History of falling: Secondary | ICD-10-CM

## 2015-05-25 DIAGNOSIS — R6889 Other general symptoms and signs: Secondary | ICD-10-CM

## 2015-05-25 NOTE — Therapy (Signed)
[facility] [address] [place], [place], [address] Phone: [phone]   Fax:  [phone]  Physical Therapy Treatment  Patient Details  Name: Pamela [name] MRN: [id number] Date of Birth: [birth year] No Data Recorded  Encounter Date: 05/25/2015      PT End of Session - 05/25/15 1214    Visit Number 3   Number of Visits 12   Date for PT Re-Evaluation 06/11/15   Authorization Type BCBS Medicare   Authorization Time Period 05/13/15-07/09/15   Authorization - Visit Number 3   Authorization - Number of Visits 10   PT Start Time 1103   PT Stop Time 1151   PT Time Calculation (min) 48 min   Activity Tolerance Patient tolerated treatment well;No increased pain   Behavior [facility] for tasks assessed/performed      Past Medical History  Diagnosis Date  . Pacemaker 08/29/2012    st jude accent DR RF device, model number [id number], serial number [id number], implanted 08/29/2012 for sinus bradycardia, runs of supraventricular tachycardia  last checked 10/23/2012  . ICD (implantable cardiac defibrillator) in place   . SSS (sick sinus syndrome) with PAT and marked bradycardia with 3 sec pauses  08/30/2012  . Status post placement of cardiac pacemaker. 08/29/12 St. Jude device 08/30/2012  . HTN (hypertension) 08/30/2012  . Hyperlipidemia 08/30/2012  . Polyneuropathy in other diseases classified elsewhere   . Macular degeneration   . Coronary artery disease     stent in the LAD artery in 2002  . Venous insufficiency     Past Surgical History  Procedure Laterality Date  . Cardiac catheterization  2006    3 stents placed  . Insert / replace / remove pacemaker  08/29/2012    st jude   . Rotator cuff repair Left   . Abdominal hysterectomy    . Cesarean section    . Kidney stone surgery    . Rotator cuff repair Right   . Cardiac catheterization  06/2001    placement of BiodivYsio 2.5.10mm stent dilated to 2.1675mm in proximal left  anterior descenting stenotic lesion  . Radiofrequency ablation  08/25/2010    ablation of left greater saphenous vein  . Hip arthroplasty Left 01/04/2014    Procedure: ARTHROPLASTY BIPOLAR HIP;  Surgeon: Shelda PalMatthew D Olin, MD;  [facility];  Service: Orthopedics;  Laterality: Left;  . Permanent pacemaker insertion N/A 08/29/2012    Procedure: PERMANENT PACEMAKER INSERTION;  Surgeon: Thurmon FairMihai Croitoru, MD;  Location: [facility] CATH LAB;  Service: Cardiovascular;  Laterality: N/A;    There were no vitals filed for this visit.  Visit Diagnosis:  Risk for falls  Weakness of both legs  Decreased functional activity tolerance      Subjective Assessment - 05/25/15 1105    Subjective Pt reports she is feeling pretty good, has been working on HEP daily, even though not everything was completed. Pt had cortison injection on L hip on Thursday with min-mod benefit.    Patient Stated Goals Strengthen LLE, improve balance   Currently in Pain? No/denies  Took an advil before session.    Multiple Pain Sites No                         [facility] Adult PT Treatment/Exercise - 05/25/15 0001    Knee/Hip Exercises: Stretches   Active Hamstring Stretch Both;3 reps;30 seconds   Active Hamstring Stretch Limitations seated, both hands on knee   Knee/Hip  Exercises: Standing   Heel Raises 10 reps;15 reps   Heel Raises Limitations Toe raises   Knee Flexion Both;15 reps   Hip Flexion AROM;Stengthening;Both;2 sets;10 reps  single UE support, alternating   Hip Abduction Left;Stengthening;AAROM   Abduction Limitations 3x8 on L  TFL dominant, supine c tactile cues to target med.   Functional Squat 2 sets;10 reps   Functional Squat Limitations cues for pelvis forward, avoiding valgus   Knee/Hip Exercises: Seated   Long Arc Quad 2 sets;10 reps;Both   Clamshell with TheraBand Red  2x10 manual stabilization of pelvis and help c L hip ROM   Knee/Hip Flexion 2x10 bilat alternating  seated, available ROM    Marching Limitations L flexion is more limited.    Knee/Hip Exercises: Supine   Bridges 10 reps;2 sets  limited excursion, but symetrical;    Knee/Hip Exercises: Sidelying   Hip ABduction Other (comment)   Hip ABduction Limitations in supine this session             Balance Exercises - 05/25/15 1140    Balance Exercises: Standing   Standing Eyes Opened Foam/compliant surface;Narrow base of support (BOS);1 rep  3 minutes   Partial Tandem Stance Eyes open;3 reps;30 secs   Turning Both;10 reps  foam normal stance with blue med ball           PT Education - 05/25/15 1213    Education provided Yes   Education Details Explained how TFL dominance on L side is destabilizing her pelvis during functional movement.    Person(s) Educated Patient   Methods Explanation   Comprehension Verbalized understanding          PT Short Term Goals - 05/13/15 1224    PT SHORT TERM GOAL #1   Title Pt will be independent with HEP.   Time 3   Period Weeks   Status New   PT SHORT TERM GOAL #2   Title Pt will complete TUG in 20 seconds or less with SPC to demonstrate decreased fall risk.   Time 3   Period Weeks   Status New   PT SHORT TERM GOAL #3   Title Pt will complete five time sit to stand in 23 seconds to demonstrate improved BLE strength and power.    Time 3   Period Weeks   Status New   PT SHORT TERM GOAL #4   Title Pt will ambulate 350 feet or greater during 3 minute walk test to demonstrate improved gait speed and functional activity tolerance.    Time 3   Period Weeks   Status New           PT Long Term Goals - 05/13/15 1226    PT LONG TERM GOAL #1   Title Pt will complete TUG in 15 seconds or less without AD to demonstrate decreased fall risk.   Time 6   Period Weeks   Status New   PT LONG TERM GOAL #2   Title Pt will complete five time sit to stand in 18 seconds or less to demonstrate improved BLE strength and power.    Time 6   Period Weeks   Status New    PT LONG TERM GOAL #3   Title Pt will ambulate 450 feet during 3 minute walk test to demonstrate improved gait speed and functional activity tolerance.    Time 6   Period Weeks   Status New   PT LONG TERM GOAL #4   Title Pt will  maintain SLS x 15 seconds bilaterally to decrease fall risk.    Time 6   Period Weeks   Status New               Plan - 05/25/15 1215    Clinical Impression Statement Pt continues to remain motviated and compliant with activity. L sided weakness in glutes continues to alter functional movement patterns maintaining excessive relaiance on hamstrings and TFL. Heavy cuing will continue to be needed to break old dysfunctional movement patterns.    Pt will benefit from skilled therapeutic intervention in order to improve on the following deficits Decreased activity tolerance;Decreased balance;Decreased endurance;Decreased strength;Difficulty walking   Rehab Potential Good   PT Frequency 2x / week   PT Duration 6 weeks   PT Treatment/Interventions ADLs/Self Care Home Management;Gait training;Stair training;Functional mobility training;Therapeutic activities;Therapeutic exercise;Balance training;Neuromuscular re-education;Patient/family education   PT Next Visit Plan Continue with functional strengthening and progress balance training PRN.   PT Home Exercise Plan No changes.         Problem List Patient Active Problem List   Diagnosis Date Noted  . DVT (deep venous thrombosis) (HCC) 01/30/2014  . Occult blood positive stool 01/30/2014  . Thrombocytopenia, unspecified (HCC) 01/06/2014  . Anemia, iron deficiency 01/06/2014  . Hip fracture, left (HCC) 01/03/2014  . QT prolongation 01/03/2014  . Atherosclerotic cardiovascular disease 08/13/2013  . Hypertensive emergency 04/08/2013  . Dizzy 04/08/2013  . PAT (paroxysmal atrial tachycardia) (HCC) 12/25/2012  . SSS (sick sinus syndrome) with PAT and marked bradycardia with 3 sec pauses  08/30/2012  . Status  post placement of cardiac pacemaker. 08/29/12 St. Jude device 08/30/2012  . HTN (hypertension) 08/30/2012  . Hyperlipidemia 08/30/2012  . Osteoporosis, unspecified 04/19/2012  . Essential and other specified forms of tremor 04/19/2012  . Unspecified essential hypertension 04/19/2012  . Cardiac dysrhythmia, unspecified 04/19/2012  . Chronic ischemic heart disease 04/19/2012  . Hereditary and idiopathic peripheral neuropathy 04/19/2012  . Memory loss 04/19/2012  . Disturbance of skin sensation 04/19/2012  . Pain in joint, shoulder region 11/20/2011  . Status post complete repair of rotator cuff 11/20/2011  . Muscle weakness (generalized) 11/20/2011    Buccola,Allan C 05/25/2015, 12:18 PM  12:18 PM  Rosamaria Lints, PT, DPT [place] License # [id number]       [facility] [address] [place], [place], [address] Phone: [phone]   Fax:  [phone]  Name: Pamela [name] MRN: [id number] Date of Birth: 02/09/1921

## 2015-05-27 ENCOUNTER — Ambulatory Visit (HOSPITAL_COMMUNITY): Payer: Medicare Other | Admitting: Physical Therapy

## 2015-05-27 DIAGNOSIS — R29898 Other symptoms and signs involving the musculoskeletal system: Secondary | ICD-10-CM

## 2015-05-27 DIAGNOSIS — Z9181 History of falling: Secondary | ICD-10-CM

## 2015-05-27 DIAGNOSIS — R6889 Other general symptoms and signs: Secondary | ICD-10-CM

## 2015-05-27 NOTE — Therapy (Signed)
[facility] [address] [place], [place], [address] Phone: [phone]   Fax:  [phone]  Physical Therapy Treatment  Patient Details  Name: Pamela [name] MRN: [id number] Date of Birth: 1921/07/16 No Data Recorded  Encounter Date: 05/27/2015      PT End of Session - 05/27/15 1205    Visit Number 4   Number of Visits 12   Date for PT Re-Evaluation 06/11/15   Authorization Type BCBS Medicare   Authorization Time Period 05/13/15-07/09/15   Authorization - Visit Number 4   Authorization - Number of Visits 10   PT Start Time 1101   PT Stop Time 1143   PT Time Calculation (min) 42 min   Equipment Utilized During Treatment Gait belt   Activity Tolerance Patient tolerated treatment well   Behavior During Therapy [facility] for tasks assessed/performed      Past Medical History  Diagnosis Date  . Pacemaker 08/29/2012    st jude accent DR RF device, model number [id number], serial number [id number], implanted 08/29/2012 for sinus bradycardia, runs of supraventricular tachycardia  last checked 10/23/2012  . ICD (implantable cardiac defibrillator) in place   . SSS (sick sinus syndrome) with PAT and marked bradycardia with 3 sec pauses  08/30/2012  . Status post placement of cardiac pacemaker. 08/29/12 St. Jude device 08/30/2012  . HTN (hypertension) 08/30/2012  . Hyperlipidemia 08/30/2012  . Polyneuropathy in other diseases classified elsewhere   . Macular degeneration   . Coronary artery disease     stent in the LAD artery in 2002  . Venous insufficiency     Past Surgical History  Procedure Laterality Date  . Cardiac catheterization  2006    3 stents placed  . Insert / replace / remove pacemaker  08/29/2012    st jude   . Rotator cuff repair Left   . Abdominal hysterectomy    . Cesarean section    . Kidney stone surgery    . Rotator cuff repair Right   . Cardiac catheterization  06/2001    placement of BiodivYsio 2.5.10mm stent dilated to  2.7575mm in proximal left anterior descenting stenotic lesion  . Radiofrequency ablation  08/25/2010    ablation of left greater saphenous vein  . Hip arthroplasty Left 01/04/2014    Procedure: ARTHROPLASTY BIPOLAR HIP;  Surgeon: Shelda PalMatthew D Olin, MD;  [facility];  Service: Orthopedics;  Laterality: Left;  . Permanent pacemaker insertion N/A 08/29/2012    Procedure: PERMANENT PACEMAKER INSERTION;  Surgeon: Thurmon FairMihai Croitoru, MD;  [facility]: [facility] CATH LAB;  Service: Cardiovascular;  Laterality: N/A;    There were no vitals filed for this visit.  Visit Diagnosis:  Risk for falls  Weakness of both legs  Decreased functional activity tolerance      Subjective Assessment - 05/27/15 1103    Subjective Pt reports that she feels that she is getting better overall since beginning PT. Denies having any pain today.    Currently in Pain? No/denies                [facility] Adult [facility] Treatment/Exercise - 05/27/15 0001    Knee/Hip Exercises: Standing   Heel Raises 15 reps   Heel Raises Limitations Toe raises   Hip Abduction Both;10 reps   Abduction Limitations tactile cueing for form   Hip Extension Both;10 reps   Knee/Hip Exercises: Seated   Long Arc Quad 15 reps   Long Arc Quad Weight 3 lbs.   Sit to [employer] CorporationSand 10  reps;without UE support   Knee/Hip Exercises: Supine   Bridges 15 reps   Straight Leg Raises Both;10 reps   Knee/Hip Exercises: Sidelying   Hip ABduction Both;10 reps   Hip ABduction Limitations tactile cueing for form             Balance Exercises - 05/27/15 1209    Balance Exercises: Standing   Standing, One Foot on a Step 8 inch;2 reps;15 secs;Eyes open   Partial Tandem Stance Eyes open;2 reps;15 secs             PT Short Term Goals - 05/13/15 1224    PT SHORT TERM GOAL #1   Title Pt will be independent with HEP.   Time 3   Period Weeks   Status New   PT SHORT TERM GOAL #2   Title Pt will complete TUG in 20 seconds or less with SPC to demonstrate decreased  fall risk.   Time 3   Period Weeks   Status New   PT SHORT TERM GOAL #3   Title Pt will complete five time sit to stand in 23 seconds to demonstrate improved BLE strength and power.    Time 3   Period Weeks   Status New   PT SHORT TERM GOAL #4   Title Pt will ambulate 350 feet or greater during 3 minute walk test to demonstrate improved gait speed and functional activity tolerance.    Time 3   Period Weeks   Status New           PT Long Term Goals - 05/13/15 1226    PT LONG TERM GOAL #1   Title Pt will complete TUG in 15 seconds or less without AD to demonstrate decreased fall risk.   Time 6   Period Weeks   Status New   PT LONG TERM GOAL #2   Title Pt will complete five time sit to stand in 18 seconds or less to demonstrate improved BLE strength and power.    Time 6   Period Weeks   Status New   PT LONG TERM GOAL #3   Title Pt will ambulate 450 feet during 3 minute walk test to demonstrate improved gait speed and functional activity tolerance.    Time 6   Period Weeks   Status New   PT LONG TERM GOAL #4   Title Pt will maintain SLS x 15 seconds bilaterally to decrease fall risk.    Time 6   Period Weeks   Status New               Plan - 05/27/15 1207    Clinical Impression Statement Continued with functional strengthening and balance training today. Pt required verbal and tactile cueing with sidelying and standing hip abduction to prevent compensation with hip flexors. Pt required frequent rest breaks due to fatigue, but denied any pain post treatment today.   PT Next Visit Plan Continue with strength and balance training        Problem List Patient Active Problem List   Diagnosis Date Noted  . DVT (deep venous thrombosis) (HCC) 01/30/2014  . Occult blood positive stool 01/30/2014  . Thrombocytopenia, unspecified (HCC) 01/06/2014  . Anemia, iron deficiency 01/06/2014  . Hip fracture, left (HCC) 01/03/2014  . QT prolongation 01/03/2014  .  Atherosclerotic cardiovascular disease 08/13/2013  . Hypertensive emergency 04/08/2013  . Dizzy 04/08/2013  . PAT (paroxysmal atrial tachycardia) (HCC) 12/25/2012  . SSS (sick sinus syndrome) with PAT and  marked bradycardia with 3 sec pauses  08/30/2012  . Status post placement of cardiac pacemaker. 08/29/12 St. Jude device 08/30/2012  . HTN (hypertension) 08/30/2012  . Hyperlipidemia 08/30/2012  . Osteoporosis, unspecified 04/19/2012  . Essential and other specified forms of tremor 04/19/2012  . Unspecified essential hypertension 04/19/2012  . Cardiac dysrhythmia, unspecified 04/19/2012  . Chronic ischemic heart disease 04/19/2012  . Hereditary and idiopathic peripheral neuropathy 04/19/2012  . Memory loss 04/19/2012  . Disturbance of skin sensation 04/19/2012  . Pain in joint, shoulder region 11/20/2011  . Status post complete repair of rotator cuff 11/20/2011  . Muscle weakness (generalized) 11/20/2011    Leona Singleton, PT, DPT [phone] 05/27/2015, 12:10 PM  [facility] [address] [place], [place], [address] Phone: [phone]   Fax:  [phone]  Name: Pamela [name] MRN: [id number] Date of Birth: 17-Aug-1920

## 2015-06-01 ENCOUNTER — Telehealth: Payer: Self-pay | Admitting: Internal Medicine

## 2015-06-01 ENCOUNTER — Telehealth: Payer: Self-pay

## 2015-06-01 ENCOUNTER — Other Ambulatory Visit: Payer: Self-pay

## 2015-06-01 ENCOUNTER — Ambulatory Visit (HOSPITAL_COMMUNITY): Payer: Medicare Other | Attending: Neurology

## 2015-06-01 DIAGNOSIS — R6889 Other general symptoms and signs: Secondary | ICD-10-CM | POA: Insufficient documentation

## 2015-06-01 DIAGNOSIS — R29898 Other symptoms and signs involving the musculoskeletal system: Secondary | ICD-10-CM | POA: Diagnosis present

## 2015-06-01 DIAGNOSIS — Z9181 History of falling: Secondary | ICD-10-CM | POA: Diagnosis not present

## 2015-06-01 MED ORDER — NITROGLYCERIN 0.4 MG SL SUBL: 0.4000 mg | SUBLINGUAL_TABLET | SUBLINGUAL | Status: DC | PRN

## 2015-06-01 NOTE — Telephone Encounter (Signed)
Okay to fill? 

## 2015-06-01 NOTE — Therapy (Signed)
[facility] [address] [place], [place], [address] Phone: [phone]   Fax:  [phone]  Physical Therapy Treatment  Patient Details  Name: Pamela [name] N Claassen MRN: [id number] Date of Birth: [birth year] No Data Recorded  Encounter Date: 06/01/2015      PT End of Session - 06/01/15 1203    Visit Number 5   Number of Visits 12   Date for PT Re-Evaluation 06/11/15   Authorization Type BCBS Medicare   Authorization Time Period 05/13/15-07/09/15   Authorization - Visit Number 5   Authorization - Number of Visits 10   PT Start Time 1105   PT Stop Time 1145   PT Time Calculation (min) 40 min   Equipment Utilized During Treatment Gait belt   Activity Tolerance Patient tolerated treatment well   Behavior [facility] tasks assessed/performed      Past Medical History  Diagnosis Date  . Pacemaker 08/29/2012    st jude accent DR RF device, model number [id number], serial number [id number], implanted 08/29/2012 for sinus bradycardia, runs of supraventricular tachycardia  last checked 10/23/2012  . ICD (implantable cardiac defibrillator) in place   . SSS (sick sinus syndrome) with PAT and marked bradycardia with 3 sec pauses  08/30/2012  . Status post placement of cardiac pacemaker. 08/29/12 St. Jude device 08/30/2012  . HTN (hypertension) 08/30/2012  . Hyperlipidemia 08/30/2012  . Polyneuropathy in other diseases classified elsewhere   . Macular degeneration   . Coronary artery disease     stent in the LAD artery in 2002  . Venous insufficiency     Past Surgical History  Procedure Laterality Date  . Cardiac catheterization  2006    3 stents placed  . Insert / replace / remove pacemaker  08/29/2012    st jude   . Rotator cuff repair Left   . Abdominal hysterectomy    . Cesarean section    . Kidney stone surgery    . Rotator cuff repair Right   . Cardiac catheterization  06/2001    placement of BiodivYsio 2.5.10mm stent dilated to  2.4975mm in proximal left anterior descenting stenotic lesion  . Radiofrequency ablation  08/25/2010    ablation of left greater saphenous vein  . Hip arthroplasty Left 01/04/2014    Procedure: ARTHROPLASTY BIPOLAR HIP;  Surgeon: Shelda PalMatthew D Olin, MD;  [facility];  Service: Orthopedics;  Laterality: Left;  . Permanent pacemaker insertion N/A 08/29/2012    Procedure: PERMANENT PACEMAKER INSERTION;  Surgeon: Thurmon FairMihai Croitoru, MD;  Location: [facility] CATH LAB;  Service: Cardiovascular;  Laterality: N/A;    There were no vitals filed for this visit.  Visit Diagnosis:  Risk for falls  Weakness of both legs  Decreased functional activity tolerance      Subjective Assessment - 06/01/15 1107    Subjective Pain free today, stated she feels her balance is improving but does need to continue improving.   Patient Stated Goals Strengthen LLE, improve balance   Currently in Pain? No/denies                         [facility] Treatment/Exercise - 06/01/15 0001    Knee/Hip Exercises: Standing   Heel Raises 15 reps   Heel Raises Limitations Toe raises   Hip Abduction Both;15 reps   Abduction Limitations tactile cueing for form   Hip Extension Both;10 reps   Extension Limitations tactile cueing for form   Functional Squat  2 sets;10 reps   Functional Squat Limitations cues for pelvis forward, avoiding valgus   Knee/Hip Exercises: Seated   Sit to Sand 10 reps;without UE support   Knee/Hip Exercises: Sidelying   Hip ABduction Both;15 reps   Hip ABduction Limitations tactile cueing for form             Balance Exercises - 06/01/15 1137    Balance Exercises: Standing   Tandem Gait Forward;1 rep   Partial Tandem Stance Eyes open;3 reps;30 secs   Sidestepping 1 rep;Theraband  RTB             PT Short Term Goals - 05/13/15 1224    PT SHORT TERM GOAL #1   Title Pt will be independent with HEP.   Time 3   Period Weeks   Status New   PT SHORT TERM GOAL #2   Title Pt  will complete TUG in 20 seconds or less with SPC to demonstrate decreased fall risk.   Time 3   Period Weeks   Status New   PT SHORT TERM GOAL #3   Title Pt will complete five time sit to stand in 23 seconds to demonstrate improved BLE strength and power.    Time 3   Period Weeks   Status New   PT SHORT TERM GOAL #4   Title Pt will ambulate 350 feet or greater during 3 minute walk test to demonstrate improved gait speed and functional activity tolerance.    Time 3   Period Weeks   Status New           PT Long Term Goals - 05/13/15 1226    PT LONG TERM GOAL #1   Title Pt will complete TUG in 15 seconds or less without AD to demonstrate decreased fall risk.   Time 6   Period Weeks   Status New   PT LONG TERM GOAL #2   Title Pt will complete five time sit to stand in 18 seconds or less to demonstrate improved BLE strength and power.    Time 6   Period Weeks   Status New   PT LONG TERM GOAL #3   Title Pt will ambulate 450 feet during 3 minute walk test to demonstrate improved gait speed and functional activity tolerance.    Time 6   Period Weeks   Status New   PT LONG TERM GOAL #4   Title Pt will maintain SLS x 15 seconds bilaterally to decrease fall risk.    Time 6   Period Weeks   Status New               Plan - 06/01/15 1204    Clinical Impression Statement Continued session focus on functional strengthening and balance training.  Pt required verbal and tactile cueing with sidelying and standing hip abduciton to prevent compensation with hip flexors.  Progressed gluteal strengthening with additional balance activities including sidestepping wtih red tband as well as balance activities with focus on NBOS activities.  Pt was limited by fatigue at end of session, no reports of pain through session.     PT Next Visit Plan Continue with strength and balance training        Problem List Patient Active Problem List   Diagnosis Date Noted  . DVT (deep venous  thrombosis) (HCC) 01/30/2014  . Occult blood positive stool 01/30/2014  . Thrombocytopenia, unspecified (HCC) 01/06/2014  . Anemia, iron deficiency 01/06/2014  . Hip fracture, left (HCC) 01/03/2014  .  QT prolongation 01/03/2014  . Atherosclerotic cardiovascular disease 08/13/2013  . Hypertensive emergency 04/08/2013  . Dizzy 04/08/2013  . PAT (paroxysmal atrial tachycardia) (HCC) 12/25/2012  . SSS (sick sinus syndrome) with PAT and marked bradycardia with 3 sec pauses  08/30/2012  . Status post placement of cardiac pacemaker. 08/29/12 St. Jude device 08/30/2012  . HTN (hypertension) 08/30/2012  . Hyperlipidemia 08/30/2012  . Osteoporosis, unspecified 04/19/2012  . Essential and other specified forms of tremor 04/19/2012  . Unspecified essential hypertension 04/19/2012  . Cardiac dysrhythmia, unspecified 04/19/2012  . Chronic ischemic heart disease 04/19/2012  . Hereditary and idiopathic peripheral neuropathy 04/19/2012  . Memory loss 04/19/2012  . Disturbance of skin sensation 04/19/2012  . Pain in joint, shoulder region 11/20/2011  . Status post complete repair of rotator cuff 11/20/2011  . Muscle weakness (generalized) 11/20/2011   Becky Sax, LPTA; CBIS [phone]   Juel Burrow 06/01/2015, 12:34 PM  [facility] [address] [place], [place], [address] Phone: [phone]   Fax:  [phone]  Name: BRITTA LOUTH MRN: [id number] Date of Birth: 02-Mar-1921

## 2015-06-01 NOTE — Telephone Encounter (Signed)
Received a  refill request fax from pharmacy for Nitrostat 0.4 mg SL TAB quantity 100.  Rx is out of date, written 08-08-11.  Please advise.

## 2015-06-01 NOTE — Telephone Encounter (Signed)
Pt is requesting a refill on Nitrostat 0.4 mg. Medication is not on the pt's med list and pt stated that Dr. Royann Shiversroitoru originally prescribed her the medication. Would you like to prescribe this medicaiton? Please advise

## 2015-06-02 NOTE — Telephone Encounter (Signed)
That will be fine. May fill. GT

## 2015-06-03 ENCOUNTER — Encounter (HOSPITAL_COMMUNITY): Payer: Medicare Other | Admitting: Physical Therapy

## 2015-06-08 ENCOUNTER — Ambulatory Visit (HOSPITAL_COMMUNITY): Payer: Medicare Other | Admitting: Physical Therapy

## 2015-06-08 DIAGNOSIS — Z9181 History of falling: Secondary | ICD-10-CM

## 2015-06-08 DIAGNOSIS — R6889 Other general symptoms and signs: Secondary | ICD-10-CM

## 2015-06-08 DIAGNOSIS — R29898 Other symptoms and signs involving the musculoskeletal system: Secondary | ICD-10-CM

## 2015-06-08 NOTE — Therapy (Signed)
[facility] [address] [place], [place], [address] Phone: [phone]   Fax:  [phone]  Physical Therapy Treatment (Reassessment)  Patient Details  Name: Pamela [name] N Haber MRN: [id number] Date of Birth: June 05, 1921 No Data Recorded  Encounter Date: 06/08/2015      PT End of Session - 06/08/15 1152    Visit Number 6   Number of Visits 12   Date for PT Re-Evaluation 07/08/15   Authorization Type BCBS Medicare   Authorization Time Period 05/13/15-07/09/15- gcodes done 6th visit   Authorization - Visit Number 6   Authorization - Number of Visits 16   PT Start Time 1100   PT Stop Time 1143   PT Time Calculation (min) 43 min   Equipment Utilized During Treatment Gait belt   Activity Tolerance Patient tolerated treatment well   Behavior During Therapy [facility] for tasks assessed/performed      Past Medical History  Diagnosis Date  . Pacemaker 08/29/2012    st jude accent DR RF device, model number [id number], serial number [id number], implanted 08/29/2012 for sinus bradycardia, runs of supraventricular tachycardia  last checked 10/23/2012  . ICD (implantable cardiac defibrillator) in place   . SSS (sick sinus syndrome) with PAT and marked bradycardia with 3 sec pauses  08/30/2012  . Status post placement of cardiac pacemaker. 08/29/12 St. Jude device 08/30/2012  . HTN (hypertension) 08/30/2012  . Hyperlipidemia 08/30/2012  . Polyneuropathy in other diseases classified elsewhere   . Macular degeneration   . Coronary artery disease     stent in the LAD artery in 2002  . Venous insufficiency     Past Surgical History  Procedure Laterality Date  . Cardiac catheterization  2006    3 stents placed  . Insert / replace / remove pacemaker  08/29/2012    st jude   . Rotator cuff repair Left   . Abdominal hysterectomy    . Cesarean section    . Kidney stone surgery    . Rotator cuff repair Right   . Cardiac catheterization  06/2001    placement of  BiodivYsio 2.5.10mm stent dilated to 2.7675mm in proximal left anterior descenting stenotic lesion  . Radiofrequency ablation  08/25/2010    ablation of left greater saphenous vein  . Hip arthroplasty Left 01/04/2014    Procedure: ARTHROPLASTY BIPOLAR HIP;  Surgeon: Shelda PalMatthew D Olin, MD;  [facility];  Service: Orthopedics;  Laterality: Left;  . Permanent pacemaker insertion N/A 08/29/2012    Procedure: PERMANENT PACEMAKER INSERTION;  Surgeon: Thurmon FairMihai Croitoru, MD;  Location: [facility] CATH LAB;  Service: Cardiovascular;  Laterality: N/A;    There were no vitals filed for this visit.  Visit Diagnosis:  Risk for falls  Weakness of both legs  Decreased functional activity tolerance      Subjective Assessment - 06/08/15 1104    Subjective Pt reports that she feels that her strength has improved, her balance has gotten a little better, and she is able to walk more without her cane. She is still having some difficulty with her balance, especially when trying to get up from a chair.    Currently in Pain? No/denies   Multiple Pain Sites No            [facility] PT Assessment - 06/08/15 0001    Observation/Other Assessments   Focus on Therapeutic Outcomes (FOTO)  40% limited   Strength   Right Hip Flexion 4-/5   Right Hip Extension 2+/5   Right  Hip ABduction 3+/5   Left Hip Flexion 4-/5   Left Hip Extension 3-/5   Left Hip ABduction 3+/5   Right Knee Flexion 4+/5   Right Knee Extension 4+/5   Left Knee Flexion 4+/5   Left Knee Extension 4+/5   Transfers   Five time sit to stand comments  24.83"   Ambulation/Gait   Gait Comments TUG: 19.68   6 minute walk test results    Endurance additional comments 3 minute walk test" 400 ft   Berg Balance Test   Sit to Stand Able to stand without using hands and stabilize independently   Standing Unsupported Able to stand safely 2 minutes   Sitting with Back Unsupported but Feet Supported on Floor or Stool Able to sit safely and securely 2 minutes    Stand to Sit Sits safely with minimal use of hands   Transfers Able to transfer safely, minor use of hands   Standing Unsupported with Eyes Closed Able to stand 10 seconds safely   Standing Ubsupported with Feet Together Able to place feet together independently and stand 1 minute safely   From Standing, Reach Forward with Outstretched Arm Can reach forward >12 cm safely (5")   From Standing Position, Pick up Object from Floor Able to pick up shoe safely and easily   From Standing Position, Turn to Look Behind Over each Shoulder Looks behind from both sides and weight shifts well   Turn 360 Degrees Able to turn 360 degrees safely in 4 seconds or less   Standing Unsupported, Alternately Place Feet on Step/Stool Able to stand independently and complete 8 steps >20 seconds   Standing Unsupported, One Foot in Front Able to take small step independently and hold 30 seconds   Standing on One Leg Tries to lift leg/unable to hold 3 seconds but remains standing independently   Total Score 49                          Balance Exercises - 06/08/15 1147    Balance Exercises: Standing   Tandem Gait Forward;2 reps   Step Over Hurdles / Cones 6" hurdles x 2 RT           PT Education - 06/08/15 1152    Education provided Yes   Education Details SLS added to HEP   Person(s) Educated Patient   Methods Explanation   Comprehension Verbalized understanding          PT Short Term Goals - 06/08/15 1153    PT SHORT TERM GOAL #1   Title Pt will be independent with HEP.   Time 3   Period Weeks   Status Achieved   PT SHORT TERM GOAL #2   Title Pt will complete TUG in 20 seconds or less with SPC to demonstrate decreased fall risk.   Time 3   Period Weeks   Status Achieved   PT SHORT TERM GOAL #3   Title Pt will complete five time sit to stand in 23 seconds to demonstrate improved BLE strength and power.    Time 3   Period Weeks   Status On-going   PT SHORT TERM GOAL #4    Title Pt will ambulate 350 feet or greater during 3 minute walk test to demonstrate improved gait speed and functional activity tolerance.    Time 3   Period Weeks   Status On-going           PT Long  Term Goals - June 21, 2015 1154    PT LONG TERM GOAL #1   Title Pt will complete TUG in 15 seconds or less without AD to demonstrate decreased fall risk.   Time 6   Period Weeks   Status On-going   PT LONG TERM GOAL #2   Title Pt will complete five time sit to stand in 18 seconds or less to demonstrate improved BLE strength and power.    Time 6   Period Weeks   Status On-going   PT LONG TERM GOAL #3   Title Pt will ambulate 450 feet during 3 minute walk test to demonstrate improved gait speed and functional activity tolerance.    Time 6   Period Weeks   Status On-going   PT LONG TERM GOAL #4   Title Pt will maintain SLS x 15 seconds bilaterally to decrease fall risk.    Time 6   Period Weeks   Status On-going               Plan - 06-21-2015 1155    Clinical Impression Statement Reassessment completed today. Pt is progressing well towards her LTGs, demonstrating improvements in strength, balance, functional mobility, and functional activity tolerance. Pt continues to demonstrate slight gait abnormalities, difficutly with dynamic balance activities, and weakness in BLE that will improve with continuation of skilled PT services. Pt had difficulty with tandem gait and stepping over 6" hurdles in today's treatment, requiring min A from PT to prevent LOB.    Pt will benefit from skilled therapeutic intervention in order to improve on the following deficits Decreased activity tolerance;Decreased balance;Decreased endurance;Decreased strength;Difficulty walking   PT Treatment/Interventions ADLs/Self Care Home Management;Gait training;Stair training;Functional mobility training;Therapeutic activities;Therapeutic exercise;Balance training;Neuromuscular re-education;Patient/family education    PT Next Visit Plan Continue with current POC with focus on gait and balance training.           G-Codes - 06/21/15 1235    Functional Assessment Tool Used FOTO   Functional Limitation Mobility: Walking and moving around   Mobility: Walking and Moving Around Current Status [phone]) At least 40 percent but less than 60 percent impaired, limited or restricted   Mobility: Walking and Moving Around Goal Status [phone]) At least 40 percent but less than 60 percent impaired, limited or restricted      Problem List Patient Active Problem List   Diagnosis Date Noted  . DVT (deep venous thrombosis) (HCC) 01/30/2014  . Occult blood positive stool 01/30/2014  . Thrombocytopenia, unspecified (HCC) 01/06/2014  . Anemia, iron deficiency 01/06/2014  . Hip fracture, left (HCC) 01/03/2014  . QT prolongation 01/03/2014  . Atherosclerotic cardiovascular disease 08/13/2013  . Hypertensive emergency 04/08/2013  . Dizzy 04/08/2013  . PAT (paroxysmal atrial tachycardia) (HCC) 12/25/2012  . SSS (sick sinus syndrome) with PAT and marked bradycardia with 3 sec pauses  08/30/2012  . Status post placement of cardiac pacemaker. 08/29/12 St. Jude device 08/30/2012  . HTN (hypertension) 08/30/2012  . Hyperlipidemia 08/30/2012  . Osteoporosis, unspecified 04/19/2012  . Essential and other specified forms of tremor 04/19/2012  . Unspecified essential hypertension 04/19/2012  . Cardiac dysrhythmia, unspecified 04/19/2012  . Chronic ischemic heart disease 04/19/2012  . Hereditary and idiopathic peripheral neuropathy 04/19/2012  . Memory loss 04/19/2012  . Disturbance of skin sensation 04/19/2012  . Pain in joint, shoulder region 11/20/2011  . Status post complete repair of rotator cuff 11/20/2011  . Muscle weakness (generalized) 11/20/2011   Physical Therapy Progress Note  Dates of Reporting Period: 05/13/15  to 06/08/15  Objective Reports of Subjective Statement: Pt demonstrates improvements in strength, TUG  time, five time sit to stand time, and distance walked in 3 minute walk test.   Objective Measurements: see above  Goal Update: see above    Plan: Continue with current PT POC, with focus on balance and gait training to decrease fall risk.   Reason Skilled Services are Required: Pt continues to demonstrate BLE weakness, decreased functional activity tolerance, and impaired balance. Continuation of skilled PT services are necessary in order to return pt to PLOF and to prevent future falls.     Leona Singleton, PT, DPT [phone] 06/08/2015, 12:35 PM  [facility] [address] [place], [place], [address] Phone: [phone]   Fax:  [phone]  Name: BRIANNY SOULLIERE MRN: [id number] Date of Birth: 1920/10/30

## 2015-06-10 ENCOUNTER — Ambulatory Visit (HOSPITAL_COMMUNITY): Payer: Medicare Other

## 2015-06-10 DIAGNOSIS — R6889 Other general symptoms and signs: Secondary | ICD-10-CM

## 2015-06-10 DIAGNOSIS — Z9181 History of falling: Secondary | ICD-10-CM

## 2015-06-10 DIAGNOSIS — R29898 Other symptoms and signs involving the musculoskeletal system: Secondary | ICD-10-CM

## 2015-06-10 NOTE — Therapy (Signed)
[place] [facility] [address] [place], [place], [address] Phone: [phone]   Fax:  [phone]  Physical Therapy Treatment  Patient Details  Name: Pamela [name] MRN: [id number] Date of Birth: [birth year] No Data Recorded  Encounter Date: 06/10/2015      PT End of Session - 06/10/15 1149    Visit Number 7   Number of Visits 12   Date for PT Re-Evaluation 07/08/15   Authorization Type BCBS Medicare   Authorization Time Period 05/13/15-07/09/15- gcodes done 6th visit   Authorization - Visit Number 7   Authorization - Number of Visits 16   PT Start Time 1106   PT Stop Time 1156   PT Time Calculation (min) 50 min   Equipment Utilized During Treatment Gait belt   Activity Tolerance Patient tolerated treatment well   Behavior During Therapy [facility] for tasks assessed/performed      Past Medical History  Diagnosis Date  . Pacemaker 08/29/2012    st jude accent DR RF device, model number [id number], serial number [id number], implanted 08/29/2012 for sinus bradycardia, runs of supraventricular tachycardia  last checked 10/23/2012  . ICD (implantable cardiac defibrillator) in place   . SSS (sick sinus syndrome) with PAT and marked bradycardia with 3 sec pauses  08/30/2012  . Status post placement of cardiac pacemaker. 08/29/12 St. Jude device 08/30/2012  . HTN (hypertension) 08/30/2012  . Hyperlipidemia 08/30/2012  . Polyneuropathy in other diseases classified elsewhere   . Macular degeneration   . Coronary artery disease     stent in the LAD artery in 2002  . Venous insufficiency     Past Surgical History  Procedure Laterality Date  . Cardiac catheterization  2006    3 stents placed  . Insert / replace / remove pacemaker  08/29/2012    st jude   . Rotator cuff repair Left   . Abdominal hysterectomy    . Cesarean section    . Kidney stone surgery    . Rotator cuff repair Right   . Cardiac catheterization  06/2001    placement of BiodivYsio  2.5.10mm stent dilated to 2.27mm in proximal left anterior descenting stenotic lesion  . Radiofrequency ablation  08/25/2010    ablation of left greater saphenous vein  . Hip arthroplasty Left 01/04/2014    Procedure: ARTHROPLASTY BIPOLAR HIP;  Surgeon: Shelda Pal, MD;  Location: [facility] OR;  Service: Orthopedics;  Laterality: Left;  . Permanent pacemaker insertion N/A 08/29/2012    Procedure: PERMANENT PACEMAKER INSERTION;  Surgeon: Thurmon Fair, MD;  Location: [facility] CATH LAB;  Service: Cardiovascular;  Laterality: N/A;    There were no vitals filed for this visit.  Visit Diagnosis:  Risk for falls  Weakness of both legs  Decreased functional activity tolerance      Subjective Assessment - 06/10/15 1104    Subjective Pt stated she is feeling great today, continues to feel weak Lt LE but does feel she is getting stronger   Patient Stated Goals Strengthen LLE, improve balance   Currently in Pain? No/denies                         [facility] Adult PT Treatment/Exercise - 06/10/15 0001    Knee/Hip Exercises: Standing   Heel Raises 10 reps   Heel Raises Limitations with squats then UE flexion with red ball   Hip Abduction Both;15 reps   Abduction Limitations tactile cueing for form   Functional Squat  10 reps   Functional Squat Limitations cues for pelvis forward, avoiding valgus   Knee/Hip Exercises: Sidelying   Hip ABduction Both;2 sets;10 reps   Hip ABduction Limitations tactile cueing for form   Knee/Hip Exercises: Prone   Hip Extension Both;2 sets;10 reps;AAROM             Balance Exercises - 06/10/15 1129    Balance Exercises: Standing   Balance Beam Tandem and sidestepping 2RT   Sidestepping 2 reps;Theraband  RTB   Step Over Hurdles / Cones 6 and 12in hurdles x 2RT             PT Short Term Goals - 06/08/15 1153    PT SHORT TERM GOAL #1   Title Pt will be independent with HEP.   Time 3   Period Weeks   Status Achieved   PT SHORT TERM GOAL #2    Title Pt will complete TUG in 20 seconds or less with SPC to demonstrate decreased fall risk.   Time 3   Period Weeks   Status Achieved   PT SHORT TERM GOAL #3   Title Pt will complete five time sit to stand in 23 seconds to demonstrate improved BLE strength and power.    Time 3   Period Weeks   Status On-going   PT SHORT TERM GOAL #4   Title Pt will ambulate 350 feet or greater during 3 minute walk test to demonstrate improved gait speed and functional activity tolerance.    Time 3   Period Weeks   Status On-going           PT Long Term Goals - 06/08/15 1154    PT LONG TERM GOAL #1   Title Pt will complete TUG in 15 seconds or less without AD to demonstrate decreased fall risk.   Time 6   Period Weeks   Status On-going   PT LONG TERM GOAL #2   Title Pt will complete five time sit to stand in 18 seconds or less to demonstrate improved BLE strength and power.    Time 6   Period Weeks   Status On-going   PT LONG TERM GOAL #3   Title Pt will ambulate 450 feet during 3 minute walk test to demonstrate improved gait speed and functional activity tolerance.    Time 6   Period Weeks   Status On-going   PT LONG TERM GOAL #4   Title Pt will maintain SLS x 15 seconds bilaterally to decrease fall risk.    Time 6   Period Weeks   Status On-going               Plan - 06/10/15 1150    Clinical Impression Statement Session focus on improving balance training, functional strengthening and improving gait mechanics.  Pt able to complete all exercises with verbal and tactile cueing for proper form and technique.  Pt with difficutly keeping balance on dynamic surfaces and holds with hurdles this session requiring min A from therapist to prevent LOB.   PT Next Visit Plan Continue with current POC with focus on gait and balance training.         Problem List Patient Active Problem List   Diagnosis Date Noted  . DVT (deep venous thrombosis) (HCC) 01/30/2014  . Occult blood  positive stool 01/30/2014  . Thrombocytopenia, unspecified (HCC) 01/06/2014  . Anemia, iron deficiency 01/06/2014  . Hip fracture, left (HCC) 01/03/2014  . QT prolongation 01/03/2014  . Atherosclerotic  cardiovascular disease 08/13/2013  . Hypertensive emergency 04/08/2013  . Dizzy 04/08/2013  . PAT (paroxysmal atrial tachycardia) (HCC) 12/25/2012  . SSS (sick sinus syndrome) with PAT and marked bradycardia with 3 sec pauses  08/30/2012  . Status post placement of cardiac pacemaker. 08/29/12 St. Jude device 08/30/2012  . HTN (hypertension) 08/30/2012  . Hyperlipidemia 08/30/2012  . Osteoporosis, unspecified 04/19/2012  . Essential and other specified forms of tremor 04/19/2012  . Unspecified essential hypertension 04/19/2012  . Cardiac dysrhythmia, unspecified 04/19/2012  . Chronic ischemic heart disease 04/19/2012  . Hereditary and idiopathic peripheral neuropathy 04/19/2012  . Memory loss 04/19/2012  . Disturbance of skin sensation 04/19/2012  . Pain in joint, shoulder region 11/20/2011  . Status post complete repair of rotator cuff 11/20/2011  . Muscle weakness (generalized) 11/20/2011   Becky Saxasey Everet Flagg, LPTA; CBIS [phone]  Juel BurrowCockerham, Ariston Grandison Jo 06/10/2015, 12:06 PM  [place] [facility] [address] [place], [place], [address] Phone: [phone]   Fax:  [phone]  Name: Pamela [name] MRN: [id number] Date of Birth: 1921-02-24

## 2015-06-15 ENCOUNTER — Ambulatory Visit (HOSPITAL_COMMUNITY): Payer: Medicare Other

## 2015-06-15 DIAGNOSIS — R6889 Other general symptoms and signs: Secondary | ICD-10-CM

## 2015-06-15 DIAGNOSIS — Z9181 History of falling: Secondary | ICD-10-CM | POA: Diagnosis not present

## 2015-06-15 DIAGNOSIS — R29898 Other symptoms and signs involving the musculoskeletal system: Secondary | ICD-10-CM

## 2015-06-15 NOTE — Therapy (Signed)
[facility] [address] [place], [place], [address] Phone: [phone]   Fax:  [phone]  Physical Therapy Treatment  Patient Details  Name: Pamela [name] MRN: [id number] Date of Birth: [birth year] Referring Provider: Nila NephewWilllis  Encounter Date: 06/15/2015      PT End of Session - 06/15/15 1115    Visit Number 8   Number of Visits 12   Date for PT Re-Evaluation 07/08/15   Authorization Type BCBS Medicare   Authorization Time Period 05/13/15-07/09/15- gcodes done 6th visit   Authorization - Visit Number 8   Authorization - Number of Visits 16   PT Start Time 1103   PT Stop Time 1156   PT Time Calculation (min) 53 min   Equipment Utilized During Treatment Gait belt   Activity Tolerance Patient tolerated treatment well   Behavior [facility] for tasks assessed/performed      Past Medical History  Diagnosis Date  . Pacemaker 08/29/2012    st jude accent DR RF device, model number [id number], serial number [id number], implanted 08/29/2012 for sinus bradycardia, runs of supraventricular tachycardia  last checked 10/23/2012  . ICD (implantable cardiac defibrillator) in place   . SSS (sick sinus syndrome) with PAT and marked bradycardia with 3 sec pauses  08/30/2012  . Status post placement of cardiac pacemaker. 08/29/12 St. Jude device 08/30/2012  . HTN (hypertension) 08/30/2012  . Hyperlipidemia 08/30/2012  . Polyneuropathy in other diseases classified elsewhere   . Macular degeneration   . Coronary artery disease     stent in the LAD artery in 2002  . Venous insufficiency     Past Surgical History  Procedure Laterality Date  . Cardiac catheterization  2006    3 stents placed  . Insert / replace / remove pacemaker  08/29/2012    st jude   . Rotator cuff repair Left   . Abdominal hysterectomy    . Cesarean section    . Kidney stone surgery    . Rotator cuff repair Right   . Cardiac catheterization  06/2001    placement of  BiodivYsio 2.5.10mm stent dilated to 2.2075mm in proximal left anterior descenting stenotic lesion  . Radiofrequency ablation  08/25/2010    ablation of left greater saphenous vein  . Hip arthroplasty Left 01/04/2014    Procedure: ARTHROPLASTY BIPOLAR HIP;  Surgeon: Shelda PalMatthew D Olin, MD;  [facility];  Service: Orthopedics;  Laterality: Left;  . Permanent pacemaker insertion N/A 08/29/2012    Procedure: PERMANENT PACEMAKER INSERTION;  Surgeon: Thurmon FairMihai Croitoru, MD;  Location: [facility] CATH LAB;  Service: Cardiovascular;  Laterality: N/A;    There were no vitals filed for this visit.  Visit Diagnosis:  Risk for falls  Weakness of both legs  Decreased functional activity tolerance      Subjective Assessment - 06/15/15 1114    Subjective Feel good today, main problem currently with balance.  Pt reports she had stomach virus over weekend and feels weak today.   Patient Stated Goals Strengthen LLE, improve balance   Currently in Pain? No/denies            [facility] - 06/15/15 0001    Assessment   Medical Diagnosis LLE weakness, fall risk   Referring Provider Willlis   Next MD Visit appt once per year with Dr. Anne HahnWillis   Prior Therapy yes, for strengthening but not balance   Strength   Right Hip Flexion --  was 4-/5   Right Hip Extension  2+/5  was 2+/5   Right Hip ABduction --  was 3+/5   Left Hip Flexion --  was 4-/5   Left Hip Extension 3-/5  was 3-/5   Left Hip ABduction --  was 3+/5                     [facility] Adult PT Treatment/Exercise - 06/15/15 0001    Transfers   Five time sit to stand comments  11.36   Ambulation/Gait   Ambulation Distance (Feet) 860 Feet  6 min walk   Assistive device --  none   Gait Pattern Decreased stride length   Gait Comments TUG 12.14" no AD   Knee/Hip Exercises: Standing   Heel Raises 15 reps   Hip Abduction Both;15 reps   Abduction Limitations tactile cueing for form   Hip Extension Both;10 reps   Extension  Limitations tactile cueing for form   Functional Squat 10 reps   Functional Squat Limitations cues for pelvis forward, avoiding valgus   Gait Training walk 860 feet   Knee/Hip Exercises: Sidelying   Hip ABduction Both;15 reps   Hip ABduction Limitations tactile cueing for form   Knee/Hip Exercises: Prone   Hip Extension Both;15 reps             Balance Exercises - 06/15/15 1211    Balance Exercises: Standing   Balance Beam Tandem and sidestepping 2RT   Sidestepping 2 reps;Theraband   Step Over Hurdles / Cones 6 and 12in hurdles x 2RT             PT Short Term Goals - 06/15/15 1120    PT SHORT TERM GOAL #1   Title Pt will be independent with HEP.   Status Achieved   PT SHORT TERM GOAL #2   Title Pt will complete TUG in 20 seconds or less with SPC to demonstrate decreased fall risk.   Status Achieved   PT SHORT TERM GOAL #3   Title Pt will complete five time sit to stand in 23 seconds to demonstrate improved BLE strength and power.    PT SHORT TERM GOAL #4   Title Pt will ambulate 350 feet or greater during 3 minute walk test to demonstrate improved gait speed and functional activity tolerance.            PT Long Term Goals - 06/08/15 1154    PT LONG TERM GOAL #1   Title Pt will complete TUG in 15 seconds or less without AD to demonstrate decreased fall risk.   Time 6   Period Weeks   Status On-going   PT LONG TERM GOAL #2   Title Pt will complete five time sit to stand in 18 seconds or less to demonstrate improved BLE strength and power.    Time 6   Period Weeks   Status On-going   PT LONG TERM GOAL #3   Title Pt will ambulate 450 feet during 3 minute walk test to demonstrate improved gait speed and functional activity tolerance.    Time 6   Period Weeks   Status On-going   PT LONG TERM GOAL #4   Title Pt will maintain SLS x 15 seconds bilaterally to decrease fall risk.    Time 6   Period Weeks   Status On-going               Plan -  06/15/15 1211    Clinical Impression Statement Session focus on improving functional strengtheing,  balance and gait training.  Pt able to complete all exercises with demonstration, verbal and tactile cueing for proper form and technqiues.  Min A required for balance training on balamce beam.  Pt is improving functional abilities with abiltiy to ambulate 860 feet with 6 min walk test, with no AD TUG complete in 12.14" and 5 STS in 11.36"  End of session pt limited by fatgiue, no reports of pain through session.   PT Next Visit Plan Continue with current POC with focus on gait and balance training.         Problem List Patient Active Problem List   Diagnosis Date Noted  . DVT (deep venous thrombosis) (HCC) 01/30/2014  . Occult blood positive stool 01/30/2014  . Thrombocytopenia, unspecified (HCC) 01/06/2014  . Anemia, iron deficiency 01/06/2014  . Hip fracture, left (HCC) 01/03/2014  . QT prolongation 01/03/2014  . Atherosclerotic cardiovascular disease 08/13/2013  . Hypertensive emergency 04/08/2013  . Dizzy 04/08/2013  . PAT (paroxysmal atrial tachycardia) (HCC) 12/25/2012  . SSS (sick sinus syndrome) with PAT and marked bradycardia with 3 sec pauses  08/30/2012  . Status post placement of cardiac pacemaker. 08/29/12 St. Jude device 08/30/2012  . HTN (hypertension) 08/30/2012  . Hyperlipidemia 08/30/2012  . Osteoporosis, unspecified 04/19/2012  . Essential and other specified forms of tremor 04/19/2012  . Unspecified essential hypertension 04/19/2012  . Cardiac dysrhythmia, unspecified 04/19/2012  . Chronic ischemic heart disease 04/19/2012  . Hereditary and idiopathic peripheral neuropathy 04/19/2012  . Memory loss 04/19/2012  . Disturbance of skin sensation 04/19/2012  . Pain in joint, shoulder region 11/20/2011  . Status post complete repair of rotator cuff 11/20/2011  . Muscle weakness (generalized) 11/20/2011   Becky Sax, LPTA; CBIS [phone]  Juel Burrow 06/15/2015, 12:17 PM  [facility] [address] [place], [place], [address] Phone: [phone]   Fax:  [phone]  Name: Pamela [name] MRN: [id number] Date of Birth: [birth year]

## 2015-06-17 ENCOUNTER — Ambulatory Visit (HOSPITAL_COMMUNITY): Payer: Medicare Other

## 2015-06-17 DIAGNOSIS — R29898 Other symptoms and signs involving the musculoskeletal system: Secondary | ICD-10-CM

## 2015-06-17 DIAGNOSIS — R6889 Other general symptoms and signs: Secondary | ICD-10-CM

## 2015-06-17 DIAGNOSIS — Z9181 History of falling: Secondary | ICD-10-CM | POA: Diagnosis not present

## 2015-06-17 NOTE — Therapy (Signed)
[facility] [address] [place], [place], [address] Phone: [phone]   Fax:  [phone]  Physical Therapy Treatment  Patient Details  Name: Pamela [name] MRN: [id number] Date of Birth: 05/05/1921 Referring Provider: Nila Nephew  Encounter Date: 06/17/2015      PT End of Session - 06/17/15 1148    Visit Number 9   Number of Visits 12   Date for PT Re-Evaluation 07/08/15   Authorization Type BCBS Medicare   Authorization Time Period 05/13/15-07/09/15- gcodes done 6th visit   Authorization - Visit Number 9   Authorization - Number of Visits 16   PT Start Time 1100   PT Stop Time 1142   PT Time Calculation (min) 42 min   Equipment Utilized During Treatment Gait belt   Activity Tolerance Patient tolerated treatment well   Behavior During Therapy [facility] for tasks assessed/performed      Past Medical History  Diagnosis Date  . Pacemaker 08/29/2012    st jude accent DR RF device, model number [id number], serial number [id number], implanted 08/29/2012 for sinus bradycardia, runs of supraventricular tachycardia  last checked 10/23/2012  . ICD (implantable cardiac defibrillator) in place   . SSS (sick sinus syndrome) with PAT and marked bradycardia with 3 sec pauses  08/30/2012  . Status post placement of cardiac pacemaker. 08/29/12 St. Jude device 08/30/2012  . HTN (hypertension) 08/30/2012  . Hyperlipidemia 08/30/2012  . Polyneuropathy in other diseases classified elsewhere   . Macular degeneration   . Coronary artery disease     stent in the LAD artery in 2002  . Venous insufficiency     Past Surgical History  Procedure Laterality Date  . Cardiac catheterization  2006    3 stents placed  . Insert / replace / remove pacemaker  08/29/2012    st jude   . Rotator cuff repair Left   . Abdominal hysterectomy    . Cesarean section    . Kidney stone surgery    . Rotator cuff repair Right   . Cardiac catheterization  06/2001    placement of  BiodivYsio 2.5.10mm stent dilated to 2.16mm in proximal left anterior descenting stenotic lesion  . Radiofrequency ablation  08/25/2010    ablation of left greater saphenous vein  . Hip arthroplasty Left 01/04/2014    Procedure: ARTHROPLASTY BIPOLAR HIP;  Surgeon: Shelda Pal, MD;  Location: [facility] OR;  Service: Orthopedics;  Laterality: Left;  . Permanent pacemaker insertion N/A 08/29/2012    Procedure: PERMANENT PACEMAKER INSERTION;  Surgeon: Thurmon Fair, MD;  Location: [facility] CATH LAB;  Service: Cardiovascular;  Laterality: N/A;    There were no vitals filed for this visit.  Visit Diagnosis:  Risk for falls  Weakness of both legs  Decreased functional activity tolerance      Subjective Assessment - 06/17/15 1102    Subjective Pt stated she feels slow today increased LBP.  Went to surgeon yesterday who wants to complete total hip replacement   Currently in Pain? Yes   Pain Score 5    Pain Location Back   Pain Orientation Lower          [facility] Adult PT Treatment/Exercise - 06/17/15 0001    Knee/Hip Exercises: Standing   Heel Raises 15 reps   Heel Raises Limitations toe raises   Hip Abduction Both;15 reps   Abduction Limitations tactile cueing for form   Hip Extension Both;10 reps   Extension Limitations tactile cueing for form   Functional Squat  10 reps   Functional Squat Limitations cues for pelvis forward, avoiding valgus   Rocker Board 2 minutes   Rocker Board Limitations R/L   SLS Rt 10", Lt 5" max of 5   Other Standing Knee Exercises 3D hip excursion             Balance Exercises - 06/17/15 1143    Balance Exercises: Standing   Balance Beam Tandem and sidestepping 2RT   Sidestepping 2 reps;Theraband  RTB   Step Over Hurdles / Cones 6 and 12in hurdles x 2RT             PT Short Term Goals - 06/15/15 1120    PT SHORT TERM GOAL #1   Title Pt will be independent with HEP.   Status Achieved   PT SHORT TERM GOAL #2   Title Pt will complete TUG in 20  seconds or less with SPC to demonstrate decreased fall risk.   Status Achieved   PT SHORT TERM GOAL #3   Title Pt will complete five time sit to stand in 23 seconds to demonstrate improved BLE strength and power.    PT SHORT TERM GOAL #4   Title Pt will ambulate 350 feet or greater during 3 minute walk test to demonstrate improved gait speed and functional activity tolerance.            PT Long Term Goals - 06/08/15 1154    PT LONG TERM GOAL #1   Title Pt will complete TUG in 15 seconds or less without AD to demonstrate decreased fall risk.   Time 6   Period Weeks   Status On-going   PT LONG TERM GOAL #2   Title Pt will complete five time sit to stand in 18 seconds or less to demonstrate improved BLE strength and power.    Time 6   Period Weeks   Status On-going   PT LONG TERM GOAL #3   Title Pt will ambulate 450 feet during 3 minute walk test to demonstrate improved gait speed and functional activity tolerance.    Time 6   Period Weeks   Status On-going   PT LONG TERM GOAL #4   Title Pt will maintain SLS x 15 seconds bilaterally to decrease fall risk.    Time 6   Period Weeks   Status On-going               Plan - 06/17/15 1148    Clinical Impression Statement Continued session focus on improving strengthenig for hip musculature, gait and balance training.  Added 3D hip excursion to improve hip mobilty with gait and began rockerboard for weight distribution.  Contrinued balance training on balance beam and hurdles and added SLS to POC, min A required for LOB episodes to reduce risk of falls.  Verbal and tactile cueing required through session for approraite form and technique.  Pt reported back pain reduced at end of session.     PT Next Visit Plan Continue with current POC with focus on gait and balance training.    PT Home Exercise Plan Add SLS infront of counter or sink        Problem List Patient Active Problem List   Diagnosis Date Noted  . DVT (deep  venous thrombosis) (HCC) 01/30/2014  . Occult blood positive stool 01/30/2014  . Thrombocytopenia, unspecified (HCC) 01/06/2014  . Anemia, iron deficiency 01/06/2014  . Hip fracture, left (HCC) 01/03/2014  . QT prolongation 01/03/2014  . Atherosclerotic cardiovascular  disease 08/13/2013  . Hypertensive emergency 04/08/2013  . Dizzy 04/08/2013  . PAT (paroxysmal atrial tachycardia) (HCC) 12/25/2012  . SSS (sick sinus syndrome) with PAT and marked bradycardia with 3 sec pauses  08/30/2012  . Status post placement of cardiac pacemaker. 08/29/12 St. Jude device 08/30/2012  . HTN (hypertension) 08/30/2012  . Hyperlipidemia 08/30/2012  . Osteoporosis, unspecified 04/19/2012  . Essential and other specified forms of tremor 04/19/2012  . Unspecified essential hypertension 04/19/2012  . Cardiac dysrhythmia, unspecified 04/19/2012  . Chronic ischemic heart disease 04/19/2012  . Hereditary and idiopathic peripheral neuropathy 04/19/2012  . Memory loss 04/19/2012  . Disturbance of skin sensation 04/19/2012  . Pain in joint, shoulder region 11/20/2011  . Status post complete repair of rotator cuff 11/20/2011  . Muscle weakness (generalized) 11/20/2011   Becky Sax, LPTA; CBIS [phone]  Juel Burrow 06/17/2015, 11:55 AM  [facility] [address] [place], [place], [address] Phone: [phone]   Fax:  [phone]  Name: Pamela [name] MRN: [id number] Date of Birth: [birth year]

## 2015-06-21 ENCOUNTER — Ambulatory Visit (HOSPITAL_COMMUNITY): Payer: Medicare Other | Admitting: Physical Therapy

## 2015-06-21 DIAGNOSIS — Z9181 History of falling: Secondary | ICD-10-CM

## 2015-06-21 DIAGNOSIS — R29898 Other symptoms and signs involving the musculoskeletal system: Secondary | ICD-10-CM

## 2015-06-21 DIAGNOSIS — R6889 Other general symptoms and signs: Secondary | ICD-10-CM

## 2015-06-21 NOTE — Therapy (Signed)
[facility] [address] [place], [place], [address] Phone: [phone]   Fax:  [phone]  Physical Therapy Treatment  Patient Details  Name: Pamela [name] MRN: [id number] Date of Birth: 18-Aug-1920 Referring Provider: Nila NephewWilllis  Encounter Date: 06/21/2015      PT End of Session - 06/21/15 1407    Visit Number 10   Number of Visits 12   Date for PT Re-Evaluation 07/08/15   Authorization Type BCBS Medicare   Authorization Time Period 05/13/15-07/09/15- gcodes done 6th visit   Authorization - Visit Number 10   Authorization - Number of Visits 16   PT Start Time 1100   PT Stop Time 1144   PT Time Calculation (min) 44 min   Equipment Utilized During Treatment Gait belt   Activity Tolerance Patient tolerated treatment well   Behavior During Therapy [facility] for tasks assessed/performed      Past Medical History  Diagnosis Date  . Pacemaker 08/29/2012    st jude accent DR RF device, model number [id number], serial number [id number], implanted 08/29/2012 for sinus bradycardia, runs of supraventricular tachycardia  last checked 10/23/2012  . ICD (implantable cardiac defibrillator) in place   . SSS (sick sinus syndrome) with PAT and marked bradycardia with 3 sec pauses  08/30/2012  . Status post placement of cardiac pacemaker. 08/29/12 St. Jude device 08/30/2012  . HTN (hypertension) 08/30/2012  . Hyperlipidemia 08/30/2012  . Polyneuropathy in other diseases classified elsewhere   . Macular degeneration   . Coronary artery disease     stent in the LAD artery in 2002  . Venous insufficiency     Past Surgical History  Procedure Laterality Date  . Cardiac catheterization  2006    3 stents placed  . Insert / replace / remove pacemaker  08/29/2012    st jude   . Rotator cuff repair Left   . Abdominal hysterectomy    . Cesarean section    . Kidney stone surgery    . Rotator cuff repair Right   . Cardiac catheterization  06/2001    placement of  BiodivYsio 2.5.10mm stent dilated to 2.8775mm in proximal left anterior descenting stenotic lesion  . Radiofrequency ablation  08/25/2010    ablation of left greater saphenous vein  . Hip arthroplasty Left 01/04/2014    Procedure: ARTHROPLASTY BIPOLAR HIP;  Surgeon: Shelda PalMatthew D Olin, MD;  [facility];  Service: Orthopedics;  Laterality: Left;  . Permanent pacemaker insertion N/A 08/29/2012    Procedure: PERMANENT PACEMAKER INSERTION;  Surgeon: Thurmon FairMihai Croitoru, MD;  Location: [facility] CATH LAB;  Service: Cardiovascular;  Laterality: N/A;    There were no vitals filed for this visit.  Visit Diagnosis:  Risk for falls  Weakness of both legs  Decreased functional activity tolerance      Subjective Assessment - 06/21/15 1106    Subjective Pt reports that she has been getting a lot of leg cramps for the past few days. She woke up with a lot of pain in her legs today, but she took an extra strength tylenol before she came in.    Currently in Pain? Yes   Pain Score 6    Pain Location Leg   Pain Orientation Right;Left                         [facility] Adult PT Treatment/Exercise - 06/21/15 0001    Knee/Hip Exercises: Standing   Heel Raises 20 reps   Heel  Raises Limitations toe raises   Hip Abduction Both;15 reps   Abduction Limitations tactile cueing for form   Hip Extension 15 reps;Knee straight   Extension Limitations tactile cueing for form   Rocker Board 2 minutes   Rocker Board Limitations R/L and A/P   SLS 3 reps in front of counter             Balance Exercises - 06/21/15 1407    Balance Exercises: Standing   Balance Beam Tandem and sidestepping 2RT   Tandem Gait 2 reps;Forward   Step Over Hurdles / Cones 6 and 12in hurdles x 2RT             PT Short Term Goals - 06/15/15 1120    PT SHORT TERM GOAL #1   Title Pt will be independent with HEP.   Status Achieved   PT SHORT TERM GOAL #2   Title Pt will complete TUG in 20 seconds or less with SPC to  demonstrate decreased fall risk.   Status Achieved   PT SHORT TERM GOAL #3   Title Pt will complete five time sit to stand in 23 seconds to demonstrate improved BLE strength and power.    PT SHORT TERM GOAL #4   Title Pt will ambulate 350 feet or greater during 3 minute walk test to demonstrate improved gait speed and functional activity tolerance.            PT Long Term Goals - 06/08/15 1154    PT LONG TERM GOAL #1   Title Pt will complete TUG in 15 seconds or less without AD to demonstrate decreased fall risk.   Time 6   Period Weeks   Status On-going   PT LONG TERM GOAL #2   Title Pt will complete five time sit to stand in 18 seconds or less to demonstrate improved BLE strength and power.    Time 6   Period Weeks   Status On-going   PT LONG TERM GOAL #3   Title Pt will ambulate 450 feet during 3 minute walk test to demonstrate improved gait speed and functional activity tolerance.    Time 6   Period Weeks   Status On-going   PT LONG TERM GOAL #4   Title Pt will maintain SLS x 15 seconds bilaterally to decrease fall risk.    Time 6   Period Weeks   Status On-going               Plan - 06/21/15 1408    Clinical Impression Statement Treatment session focused on functional strengthening and balance training. Pt had increased difficulty with balance beam exercises today, requiring HHA and CGA from PT to complete tandem gait on balance beam. Pt required verbal cueing to avoid circumduction with stepping over hurdles. Pt denied any increased pain post treatment.    PT Next Visit Plan Continue with balance and gait training        Problem List Patient Active Problem List   Diagnosis Date Noted  . DVT (deep venous thrombosis) (HCC) 01/30/2014  . Occult blood positive stool 01/30/2014  . Thrombocytopenia, unspecified (HCC) 01/06/2014  . Anemia, iron deficiency 01/06/2014  . Hip fracture, left (HCC) 01/03/2014  . QT prolongation 01/03/2014  . Atherosclerotic  cardiovascular disease 08/13/2013  . Hypertensive emergency 04/08/2013  . Dizzy 04/08/2013  . PAT (paroxysmal atrial tachycardia) (HCC) 12/25/2012  . SSS (sick sinus syndrome) with PAT and marked bradycardia with 3 sec pauses  08/30/2012  .  Status post placement of cardiac pacemaker. 08/29/12 St. Jude device 08/30/2012  . HTN (hypertension) 08/30/2012  . Hyperlipidemia 08/30/2012  . Osteoporosis, unspecified 04/19/2012  . Essential and other specified forms of tremor 04/19/2012  . Unspecified essential hypertension 04/19/2012  . Cardiac dysrhythmia, unspecified 04/19/2012  . Chronic ischemic heart disease 04/19/2012  . Hereditary and idiopathic peripheral neuropathy 04/19/2012  . Memory loss 04/19/2012  . Disturbance of skin sensation 04/19/2012  . Pain in joint, shoulder region 11/20/2011  . Status post complete repair of rotator cuff 11/20/2011  . Muscle weakness (generalized) 11/20/2011    Leona Singleton, PT, DPT [phone] 06/21/2015, 2:13 PM  [facility] [address] [place], [place], [address] Phone: [phone]   Fax:  [phone]  Name: Pamela [name] MRN: [id number] Date of Birth: 18-Feb-1921

## 2015-06-23 ENCOUNTER — Ambulatory Visit (HOSPITAL_COMMUNITY): Payer: Medicare Other | Admitting: Physical Therapy

## 2015-06-23 DIAGNOSIS — Z9181 History of falling: Secondary | ICD-10-CM

## 2015-06-23 DIAGNOSIS — R6889 Other general symptoms and signs: Secondary | ICD-10-CM

## 2015-06-23 DIAGNOSIS — R29898 Other symptoms and signs involving the musculoskeletal system: Secondary | ICD-10-CM

## 2015-06-23 NOTE — Therapy (Signed)
[facility] [address] [place], [place], [address] Phone: [phone]   Fax:  [phone]  Physical Therapy Treatment  Patient Details  Name: Pamela [name] MRN: [id number] Date of Birth: 1920/12/17 Referring Provider: Nila Nephew  Encounter Date: 06/23/2015      PT End of Session - 06/23/15 1154    Visit Number 11   Number of Visits 16   Date for PT Re-Evaluation 07/08/15   Authorization Type BCBS Medicare   Authorization Time Period 05/13/15-07/09/15- gcodes done 6th visit   Authorization - Visit Number 11   Authorization - Number of Visits 16   PT Start Time 1102   PT Stop Time 1150   PT Time Calculation (min) 48 min   Equipment Utilized During Treatment Gait belt   Activity Tolerance Patient tolerated treatment well   Behavior During Therapy [facility] for tasks assessed/performed      Past Medical History  Diagnosis Date  . Pacemaker 08/29/2012    st jude accent DR RF device, model number [id number], serial number [id number], implanted 08/29/2012 for sinus bradycardia, runs of supraventricular tachycardia  last checked 10/23/2012  . ICD (implantable cardiac defibrillator) in place   . SSS (sick sinus syndrome) with PAT and marked bradycardia with 3 sec pauses  08/30/2012  . Status post placement of cardiac pacemaker. 08/29/12 St. Jude device 08/30/2012  . HTN (hypertension) 08/30/2012  . Hyperlipidemia 08/30/2012  . Polyneuropathy in other diseases classified elsewhere   . Macular degeneration   . Coronary artery disease     stent in the LAD artery in 2002  . Venous insufficiency     Past Surgical History  Procedure Laterality Date  . Cardiac catheterization  2006    3 stents placed  . Insert / replace / remove pacemaker  08/29/2012    st jude   . Rotator cuff repair Left   . Abdominal hysterectomy    . Cesarean section    . Kidney stone surgery    . Rotator cuff repair Right   . Cardiac catheterization  06/2001    placement of  BiodivYsio 2.5.10mm stent dilated to 2.84mm in proximal left anterior descenting stenotic lesion  . Radiofrequency ablation  08/25/2010    ablation of left greater saphenous vein  . Hip arthroplasty Left 01/04/2014    Procedure: ARTHROPLASTY BIPOLAR HIP;  Surgeon: Shelda Pal, MD;  Location: [facility] OR;  Service: Orthopedics;  Laterality: Left;  . Permanent pacemaker insertion N/A 08/29/2012    Procedure: PERMANENT PACEMAKER INSERTION;  Surgeon: Thurmon Fair, MD;  [facility] CATH [facility];  Service: Cardiovascular;  Laterality: N/A;    There were no vitals filed for this visit.  Visit Diagnosis:  Risk for falls  Weakness of both legs  Decreased functional activity tolerance      Subjective Assessment - 06/23/15 1106    Subjective Pt reports that she has a little bit of an upset stomach today. She was having some pain in her LLE this morning, but she feels better now.   Currently in Pain? Yes   Pain Score 6    Pain Location Leg   Pain Orientation Left                         [facility] Adult PT Treatment/Exercise - 06/23/15 0001    Knee/Hip Exercises: Standing   Heel Raises 20 reps   Forward Step Up 10 reps;Step Height: 4"   Forward Step Up Limitations 4"  step   Knee/Hip Exercises: Seated   Sit to Sand 10 reps;without UE support             Balance Exercises - 06/23/15 1200    Balance Exercises: Standing   Balance Beam Tandem and sidestepping 2RT   Tandem Gait 2 reps;Forward   Sidestepping 2 reps;Theraband  RTB   Step Over Hurdles / Cones 6 and 12in hurdles x 2RT             PT Short Term Goals - 06/15/15 1120    PT SHORT TERM GOAL #1   Title Pt will be independent with HEP.   Status Achieved   PT SHORT TERM GOAL #2   Title Pt will complete TUG in 20 seconds or less with SPC to demonstrate decreased fall risk.   Status Achieved   PT SHORT TERM GOAL #3   Title Pt will complete five time sit to stand in 23 seconds to demonstrate improved BLE  strength and power.    PT SHORT TERM GOAL #4   Title Pt will ambulate 350 feet or greater during 3 minute walk test to demonstrate improved gait speed and functional activity tolerance.            PT Long Term Goals - 06/08/15 1154    PT LONG TERM GOAL #1   Title Pt will complete TUG in 15 seconds or less without AD to demonstrate decreased fall risk.   Time 6   Period Weeks   Status On-going   PT LONG TERM GOAL #2   Title Pt will complete five time sit to stand in 18 seconds or less to demonstrate improved BLE strength and power.    Time 6   Period Weeks   Status On-going   PT LONG TERM GOAL #3   Title Pt will ambulate 450 feet during 3 minute walk test to demonstrate improved gait speed and functional activity tolerance.    Time 6   Period Weeks   Status On-going   PT LONG TERM GOAL #4   Title Pt will maintain SLS x 15 seconds bilaterally to decrease fall risk.    Time 6   Period Weeks   Status On-going               Plan - 06/23/15 1154    Clinical Impression Statement Continued with balance and strength training in today's treatment. Pt had increased difficulty with sidestepping on balance beam, requiring min A to complete without LOB. Pt was able to complete hurdles with decreased difficulty today, requiring only CGA to complete the activity safely. Pt denied any increased pain post treatment.    PT Next Visit Plan Continue with balance training, gait training, strengthening. Add tandem stance        Problem List Patient Active Problem List   Diagnosis Date Noted  . DVT (deep venous thrombosis) (HCC) 01/30/2014  . Occult blood positive stool 01/30/2014  . Thrombocytopenia, unspecified (HCC) 01/06/2014  . Anemia, iron deficiency 01/06/2014  . Hip fracture, left (HCC) 01/03/2014  . QT prolongation 01/03/2014  . Atherosclerotic cardiovascular disease 08/13/2013  . Hypertensive emergency 04/08/2013  . Dizzy 04/08/2013  . PAT (paroxysmal atrial tachycardia)  (HCC) 12/25/2012  . SSS (sick sinus syndrome) with PAT and marked bradycardia with 3 sec pauses  08/30/2012  . Status post placement of cardiac pacemaker. 08/29/12 St. Jude device 08/30/2012  . HTN (hypertension) 08/30/2012  . Hyperlipidemia 08/30/2012  . Osteoporosis, unspecified 04/19/2012  . Essential and  other specified forms of tremor 04/19/2012  . Unspecified essential hypertension 04/19/2012  . Cardiac dysrhythmia, unspecified 04/19/2012  . Chronic ischemic heart disease 04/19/2012  . Hereditary and idiopathic peripheral neuropathy 04/19/2012  . Memory loss 04/19/2012  . Disturbance of skin sensation 04/19/2012  . Pain in joint, shoulder region 11/20/2011  . Status post complete repair of rotator cuff 11/20/2011  . Muscle weakness (generalized) 11/20/2011    Leona SingletonLauren Jewett Mcgann, PT, DPT [phone] 06/23/2015, 12:01 PM  [facility] [address] [place], [place], [address] Phone: [phone]   Fax:  [phone]  Name: Pamela [name] MRN: [id number] Date of Birth: [birth year]

## 2015-06-28 ENCOUNTER — Ambulatory Visit (HOSPITAL_COMMUNITY): Payer: Medicare Other | Admitting: Physical Therapy

## 2015-06-28 DIAGNOSIS — R6889 Other general symptoms and signs: Secondary | ICD-10-CM

## 2015-06-28 DIAGNOSIS — Z9181 History of falling: Secondary | ICD-10-CM

## 2015-06-28 DIAGNOSIS — R29898 Other symptoms and signs involving the musculoskeletal system: Secondary | ICD-10-CM

## 2015-06-28 NOTE — Therapy (Signed)
[facility] [address] [place], [place], [address] Phone: [phone]   Fax:  [phone]  Physical Therapy Treatment  Patient Details  Name: Pamela [name] N Pennings MRN: [id number] Date of Birth: 1921/02/09 Referring Provider: Nila NephewWilllis  Encounter Date: 06/28/2015      PT End of Session - 06/28/15 0931    Visit Number 12   Number of Visits 16   Date for PT Re-Evaluation 07/08/15   Authorization Type BCBS Medicare   Authorization Time Period 05/13/15-07/09/15- gcodes done 6th visit   Authorization - Visit Number 12   Authorization - Number of Visits 16   PT Start Time 0847   PT Stop Time 0927   PT Time Calculation (min) 40 min   Equipment Utilized During Treatment Gait belt   Activity Tolerance Patient tolerated treatment well   Behavior [facility] for tasks assessed/performed      Past Medical History  Diagnosis Date  . Pacemaker 08/29/2012    st jude accent DR RF device, model number [id number], serial number [id number], implanted 08/29/2012 for sinus bradycardia, runs of supraventricular tachycardia  last checked 10/23/2012  . ICD (implantable cardiac defibrillator) in place   . SSS (sick sinus syndrome) with PAT and marked bradycardia with 3 sec pauses  08/30/2012  . Status post placement of cardiac pacemaker. 08/29/12 St. Jude device 08/30/2012  . HTN (hypertension) 08/30/2012  . Hyperlipidemia 08/30/2012  . Polyneuropathy in other diseases classified elsewhere   . Macular degeneration   . Coronary artery disease     stent in the LAD artery in 2002  . Venous insufficiency     Past Surgical History  Procedure Laterality Date  . Cardiac catheterization  2006    3 stents placed  . Insert / replace / remove pacemaker  08/29/2012    st jude   . Rotator cuff repair Left   . Abdominal hysterectomy    . Cesarean section    . Kidney stone surgery    . Rotator cuff repair Right   . Cardiac catheterization  06/2001    placement of  BiodivYsio 2.5.10mm stent dilated to 2.6175mm in proximal left anterior descenting stenotic lesion  . Radiofrequency ablation  08/25/2010    ablation of left greater saphenous vein  . Hip arthroplasty Left 01/04/2014    Procedure: ARTHROPLASTY BIPOLAR HIP;  Surgeon: Shelda PalMatthew D Olin, MD;  [facility];  Service: Orthopedics;  Laterality: Left;  . Permanent pacemaker insertion N/A 08/29/2012    Procedure: PERMANENT PACEMAKER INSERTION;  Surgeon: Thurmon FairMihai Croitoru, MD;  [facility];  Service: Cardiovascular;  Laterality: N/A;    There were no vitals filed for this visit.  Visit Diagnosis:  Risk for falls  Weakness of both legs  Decreased functional activity tolerance      Subjective Assessment - 06/28/15 0851    Subjective Pt reports that she is has some pain in her groin today, she thinks that she was on her feet too much this weekend.    Currently in Pain? Yes   Pain Score 4    Pain Location Groin                         [facility] Adult PT Treatment/Exercise - 06/28/15 0001    Knee/Hip Exercises: Standing   Heel Raises 20 reps   Heel Raises Limitations toe raises   Lateral Step Up 10 reps;Step Height: 4"   Lateral Step Up Limitations 4" step  Forward Step Up 10 reps;Step Height: 4"   Forward Step Up Limitations 4" step   Rocker Board 2 minutes   Rocker Board Limitations R/L and A/P             Balance Exercises - 06/28/15 0918    Balance Exercises: Standing   Retro Gait 3 reps  15 seconds   Sidestepping 2 reps;Theraband  RTB   Step Over Hurdles / Cones 6 inch hurdles x 1 RT             PT Short Term Goals - 06/15/15 1120    PT SHORT TERM GOAL #1   Title Pt will be independent with HEP.   Status Achieved   PT SHORT TERM GOAL #2   Title Pt will complete TUG in 20 seconds or less with SPC to demonstrate decreased fall risk.   Status Achieved   PT SHORT TERM GOAL #3   Title Pt will complete five time sit to stand in 23 seconds to  demonstrate improved BLE strength and power.    PT SHORT TERM GOAL #4   Title Pt will ambulate 350 feet or greater during 3 minute walk test to demonstrate improved gait speed and functional activity tolerance.            PT Long Term Goals - 06/08/15 1154    PT LONG TERM GOAL #1   Title Pt will complete TUG in 15 seconds or less without AD to demonstrate decreased fall risk.   Time 6   Period Weeks   Status On-going   PT LONG TERM GOAL #2   Title Pt will complete five time sit to stand in 18 seconds or less to demonstrate improved BLE strength and power.    Time 6   Period Weeks   Status On-going   PT LONG TERM GOAL #3   Title Pt will ambulate 450 feet during 3 minute walk test to demonstrate improved gait speed and functional activity tolerance.    Time 6   Period Weeks   Status On-going   PT LONG TERM GOAL #4   Title Pt will maintain SLS x 15 seconds bilaterally to decrease fall risk.    Time 6   Period Weeks   Status On-going               Plan - 06/28/15 1202    Clinical Impression Statement Continued with functional strengthening and balance training in today's treatment to decrease fall risk. Tandem stance was added, pt was able to maintain partial tandem stance for 15" with CGA from PT for safety. Pt continues to have difficulty with stepping over hurdles due to poor foot clearance and impaired balance, pt required min A to prevent LOB in today's treatment session. Pt denied any increased pain post treatment.    PT Next Visit Plan Balance training on foam, continue with strengthening        Problem List Patient Active Problem List   Diagnosis Date Noted  . DVT (deep venous thrombosis) (HCC) 01/30/2014  . Occult blood positive stool 01/30/2014  . Thrombocytopenia, unspecified (HCC) 01/06/2014  . Anemia, iron deficiency 01/06/2014  . Hip fracture, left (HCC) 01/03/2014  . QT prolongation 01/03/2014  . Atherosclerotic cardiovascular disease 08/13/2013  .  Hypertensive emergency 04/08/2013  . Dizzy 04/08/2013  . PAT (paroxysmal atrial tachycardia) (HCC) 12/25/2012  . SSS (sick sinus syndrome) with PAT and marked bradycardia with 3 sec pauses  08/30/2012  . Status post placement of  cardiac pacemaker. 08/29/12 St. Jude device 08/30/2012  . HTN (hypertension) 08/30/2012  . Hyperlipidemia 08/30/2012  . Osteoporosis, unspecified 04/19/2012  . Essential and other specified forms of tremor 04/19/2012  . Unspecified essential hypertension 04/19/2012  . Cardiac dysrhythmia, unspecified 04/19/2012  . Chronic ischemic heart disease 04/19/2012  . Hereditary and idiopathic peripheral neuropathy 04/19/2012  . Memory loss 04/19/2012  . Disturbance of skin sensation 04/19/2012  . Pain in joint, shoulder region 11/20/2011  . Status post complete repair of rotator cuff 11/20/2011  . Muscle weakness (generalized) 11/20/2011    Leona Singleton, PT, DPT [phone] 06/28/2015, 12:05 PM  [facility] [address] [place], [place], [address] Phone: [phone]   Fax:  [phone]  Name: SAMAIRA HOLZWORTH MRN: [id number] Date of Birth: 1920/10/15

## 2015-07-01 ENCOUNTER — Ambulatory Visit (HOSPITAL_COMMUNITY): Payer: Medicare Other | Attending: Neurology | Admitting: Physical Therapy

## 2015-07-01 DIAGNOSIS — R6889 Other general symptoms and signs: Secondary | ICD-10-CM | POA: Insufficient documentation

## 2015-07-01 DIAGNOSIS — Z9181 History of falling: Secondary | ICD-10-CM | POA: Diagnosis not present

## 2015-07-01 DIAGNOSIS — R29898 Other symptoms and signs involving the musculoskeletal system: Secondary | ICD-10-CM | POA: Insufficient documentation

## 2015-07-01 NOTE — Therapy (Signed)
[facility] [address] [place], [place], [address] Phone: [phone]   Fax:  [phone]  Physical Therapy Treatment  Patient Details  Name: Pamela [name] N Joe MRN: [id number] Date of Birth: October 25, 1920 Referring Provider: Nila NephewWilllis  Encounter Date: 07/01/2015      PT End of Session - 07/01/15 1110    Visit Number 13   Number of Visits 16   Date for PT Re-Evaluation 07/08/15   Authorization Type BCBS Medicare   Authorization Time Period 05/13/15-07/09/15- gcodes done 6th visit   Authorization - Visit Number 13   Authorization - Number of Visits 16   PT Start Time 0932   PT Stop Time 1015   PT Time Calculation (min) 43 min   Equipment Utilized During Treatment Gait belt   Activity Tolerance Patient tolerated treatment well   Behavior [facility] assessed/performed      Past Medical History  Diagnosis Date  . Pacemaker 08/29/2012    st jude accent DR RF device, model number [id number], serial number [id number], implanted 08/29/2012 for sinus bradycardia, runs of supraventricular tachycardia  last checked 10/23/2012  . ICD (implantable cardiac defibrillator) in place   . SSS (sick sinus syndrome) with PAT and marked bradycardia with 3 sec pauses  08/30/2012  . Status post placement of cardiac pacemaker. 08/29/12 St. Jude device 08/30/2012  . HTN (hypertension) 08/30/2012  . Hyperlipidemia 08/30/2012  . Polyneuropathy in other diseases classified elsewhere   . Macular degeneration   . Coronary artery disease     stent in the LAD artery in 2002  . Venous insufficiency     Past Surgical History  Procedure Laterality Date  . Cardiac catheterization  2006    3 stents placed  . Insert / replace / remove pacemaker  08/29/2012    st jude   . Rotator cuff repair Left   . Abdominal hysterectomy    . Cesarean section    . Kidney stone surgery    . Rotator cuff repair Right   . Cardiac catheterization  06/2001    placement of  BiodivYsio 2.5.10mm stent dilated to 2.1275mm in proximal left anterior descenting stenotic lesion  . Radiofrequency ablation  08/25/2010    ablation of left greater saphenous vein  . Hip arthroplasty Left 01/04/2014    Procedure: ARTHROPLASTY BIPOLAR HIP;  Surgeon: Shelda PalMatthew D Olin, MD;  [facility];  Service: Orthopedics;  Laterality: Left;  . Permanent pacemaker insertion N/A 08/29/2012    Procedure: PERMANENT PACEMAKER INSERTION;  Surgeon: Thurmon FairMihai Croitoru, MD;  [facility] CATH LAB;  Service: Cardiovascular;  Laterality: N/A;    There were no vitals filed for this visit.  Visit Diagnosis:  Risk for falls  Weakness of both legs  Decreased functional activity tolerance      Subjective Assessment - 07/01/15 0939    Subjective Pt reports that she does not have any pain today, she is a little flustered because she overslept and she broke her glasses.    Currently in Pain? No/denies   Pain Score 0-No pain               [facility] Adult PT Treatment/Exercise - 07/01/15 0001    Knee/Hip Exercises: Standing   Heel Raises 20 reps   Heel Raises Limitations toe raises   Hip Abduction Both;15 reps   Abduction Limitations tactile cueing for form   Lateral Step Up 10 reps;Step Height: 4"   Lateral Step Up Limitations 4" step  Forward Step Up 10 reps;Step Height: 4"   Forward Step Up Limitations 4" step   Rocker Board 2 minutes   Rocker Board Limitations R/L and A/P   Knee/Hip Exercises: Seated   Long Arc Quad 15 reps             Balance Exercises - 07/01/15 1108    Balance Exercises: Standing   Tandem Gait 2 reps;Forward             PT Short Term Goals - 06/15/15 1120    PT SHORT TERM GOAL #1   Title Pt will be independent with HEP.   Status Achieved   PT SHORT TERM GOAL #2   Title Pt will complete TUG in 20 seconds or less with SPC to demonstrate decreased fall risk.   Status Achieved   PT SHORT TERM GOAL #3   Title Pt will complete five time sit to stand in 23  seconds to demonstrate improved BLE strength and power.    PT SHORT TERM GOAL #4   Title Pt will ambulate 350 feet or greater during 3 minute walk test to demonstrate improved gait speed and functional activity tolerance.            PT Long Term Goals - 06/08/15 1154    PT LONG TERM GOAL #1   Title Pt will complete TUG in 15 seconds or less without AD to demonstrate decreased fall risk.   Time 6   Period Weeks   Status On-going   PT LONG TERM GOAL #2   Title Pt will complete five time sit to stand in 18 seconds or less to demonstrate improved BLE strength and power.    Time 6   Period Weeks   Status On-going   PT LONG TERM GOAL #3   Title Pt will ambulate 450 feet during 3 minute walk test to demonstrate improved gait speed and functional activity tolerance.    Time 6   Period Weeks   Status On-going   PT LONG TERM GOAL #4   Title Pt will maintain SLS x 15 seconds bilaterally to decrease fall risk.    Time 6   Period Weeks   Status On-going               Plan - 07/01/15 1110    Clinical Impression Statement Pt presented to PT with some confusion today, stating that she thought it was Monday on 2 occasions during treatment. Treatment session focused on functional LE strengthening and balance training to decrease fall risk. Pt had some increased difficulty with balance activities today, reporting that it was harder to complete the activities without her glasses. Pt required frequent rest breaks throughout treatment due to fatigue.    PT Next Visit Plan Balance training on foam, continue with strengthening        Problem List Patient Active Problem List   Diagnosis Date Noted  . DVT (deep venous thrombosis) (HCC) 01/30/2014  . Occult blood positive stool 01/30/2014  . Thrombocytopenia, unspecified (HCC) 01/06/2014  . Anemia, iron deficiency 01/06/2014  . Hip fracture, left (HCC) 01/03/2014  . QT prolongation 01/03/2014  . Atherosclerotic cardiovascular disease  08/13/2013  . Hypertensive emergency 04/08/2013  . Dizzy 04/08/2013  . PAT (paroxysmal atrial tachycardia) (HCC) 12/25/2012  . SSS (sick sinus syndrome) with PAT and marked bradycardia with 3 sec pauses  08/30/2012  . Status post placement of cardiac pacemaker. 08/29/12 St. Jude device 08/30/2012  . HTN (hypertension) 08/30/2012  . Hyperlipidemia  08/30/2012  . Osteoporosis, unspecified 04/19/2012  . Essential and other specified forms of tremor 04/19/2012  . Unspecified essential hypertension 04/19/2012  . Cardiac dysrhythmia, unspecified 04/19/2012  . Chronic ischemic heart disease 04/19/2012  . Hereditary and idiopathic peripheral neuropathy 04/19/2012  . Memory loss 04/19/2012  . Disturbance of skin sensation 04/19/2012  . Pain in joint, shoulder region 11/20/2011  . Status post complete repair of rotator cuff 11/20/2011  . Muscle weakness (generalized) 11/20/2011    Leona Singleton, PT, DPT [phone] 07/01/2015, 11:19 AM  [place] [facility] [address] [place], [place], [address] Phone: [phone]   Fax:  [phone]  Name: KHAMORA KARAN MRN: [id number] Date of Birth: January 11, 1921

## 2015-07-06 ENCOUNTER — Ambulatory Visit (HOSPITAL_COMMUNITY): Payer: Medicare Other | Admitting: Physical Therapy

## 2015-07-06 DIAGNOSIS — Z9181 History of falling: Secondary | ICD-10-CM

## 2015-07-06 DIAGNOSIS — R6889 Other general symptoms and signs: Secondary | ICD-10-CM

## 2015-07-06 DIAGNOSIS — R29898 Other symptoms and signs involving the musculoskeletal system: Secondary | ICD-10-CM

## 2015-07-06 NOTE — Therapy (Signed)
[place] [facility] [address] [place], [place], [address] Phone: [phone]   Fax:  [phone]  Physical Therapy Treatment  Patient Details  Name: Pamela [name] MRN: [id number] Date of Birth: 05-26-1921 Referring Provider: Nila Nephew  Encounter Date: 07/06/2015      PT End of Session - 07/06/15 1555    Visit Number 14   Number of Visits 16   Date for PT Re-Evaluation 07/08/15   Authorization Type BCBS Medicare   Authorization Time Period 05/13/15-07/09/15- gcodes done 6th visit   Authorization - Visit Number 14   Authorization - Number of Visits 16   PT Start Time 1522   PT Stop Time 1600   PT Time Calculation (min) 38 min   Equipment Utilized During Treatment Gait belt   Activity Tolerance Patient tolerated treatment well   Behavior During Therapy [facility] for tasks assessed/performed      Past Medical History  Diagnosis Date  . Pacemaker 08/29/2012    st jude accent DR RF device, model number [id number], serial number [id number], implanted 08/29/2012 for sinus bradycardia, runs of supraventricular tachycardia  last checked 10/23/2012  . ICD (implantable cardiac defibrillator) in place   . SSS (sick sinus syndrome) with PAT and marked bradycardia with 3 sec pauses  08/30/2012  . Status post placement of cardiac pacemaker. 08/29/12 St. Jude device 08/30/2012  . HTN (hypertension) 08/30/2012  . Hyperlipidemia 08/30/2012  . Polyneuropathy in other diseases classified elsewhere   . Macular degeneration   . Coronary artery disease     stent in the LAD artery in 2002  . Venous insufficiency     Past Surgical History  Procedure Laterality Date  . Cardiac catheterization  2006    3 stents placed  . Insert / replace / remove pacemaker  08/29/2012    st jude   . Rotator cuff repair Left   . Abdominal hysterectomy    . Cesarean section    . Kidney stone surgery    . Rotator cuff repair Right   . Cardiac catheterization  06/2001    placement of  BiodivYsio 2.5.10mm stent dilated to 2.25mm in proximal left anterior descenting stenotic lesion  . Radiofrequency ablation  08/25/2010    ablation of left greater saphenous vein  . Hip arthroplasty Left 01/04/2014    Procedure: ARTHROPLASTY BIPOLAR HIP;  Surgeon: Shelda Pal, MD;  Location: [facility] OR;  Service: Orthopedics;  Laterality: Left;  . Permanent pacemaker insertion N/A 08/29/2012    Procedure: PERMANENT PACEMAKER INSERTION;  Surgeon: Thurmon Fair, MD;  [facility] CATH LAB;  Service: Cardiovascular;  Laterality: N/A;    There were no vitals filed for this visit.  Visit Diagnosis:  Risk for falls  Weakness of both legs  Decreased functional activity tolerance      Subjective Assessment - 07/06/15 1644    Subjective Pt states the rain and dampness has slowed her down and made her achey today.     Currently in Pain? No/denies                         [facility] Adult PT Treatment/Exercise - 07/06/15 1522    Knee/Hip Exercises: Standing   Heel Raises 20 reps   Heel Raises Limitations toe raises   Hip Abduction Both;20 reps   Abduction Limitations tactile cueing for form   Hip Extension 20 reps;Both   Extension Limitations tactile cueing for form   Lateral Step Up 15 reps  Lateral Step Up Limitations 4" step   Forward Step Up 15 reps   Forward Step Up Limitations 4" step             Balance Exercises - 07/06/15 1545    Balance Exercises: Standing   Step Over Hurdles / Cones 6"/12" hurdles x 1 RT each on  balance beam forward and sideways             PT Short Term Goals - 06/15/15 1120    PT SHORT TERM GOAL #1   Title Pt will be independent with HEP.   Status Achieved   PT SHORT TERM GOAL #2   Title Pt will complete TUG in 20 seconds or less with SPC to demonstrate decreased fall risk.   Status Achieved   PT SHORT TERM GOAL #3   Title Pt will complete five time sit to stand in 23 seconds to demonstrate improved BLE strength and power.     PT SHORT TERM GOAL #4   Title Pt will ambulate 350 feet or greater during 3 minute walk test to demonstrate improved gait speed and functional activity tolerance.            PT Long Term Goals - 06/08/15 1154    PT LONG TERM GOAL #1   Title Pt will complete TUG in 15 seconds or less without AD to demonstrate decreased fall risk.   Time 6   Period Weeks   Status On-going   PT LONG TERM GOAL #2   Title Pt will complete five time sit to stand in 18 seconds or less to demonstrate improved BLE strength and power.    Time 6   Period Weeks   Status On-going   PT LONG TERM GOAL #3   Title Pt will ambulate 450 feet during 3 minute walk test to demonstrate improved gait speed and functional activity tolerance.    Time 6   Period Weeks   Status On-going   PT LONG TERM GOAL #4   Title Pt will maintain SLS x 15 seconds bilaterally to decrease fall risk.    Time 6   Period Weeks   Status On-going               Plan - 07/06/15 1608    Clinical Impression Statement Pt showed late for appt and had to leave early due to getting dark and raining.  Continued focus on LE strength and balance.  Added sidestepping hurdle negotiation in addition to forward.  pt able to complete with min assist with abiltiy to self correct balance.  Pt wtihout complaints of pain during session.   PT Next Visit Plan Re-evaluate next session.         Problem List Patient Active Problem List   Diagnosis Date Noted  . DVT (deep venous thrombosis) (HCC) 01/30/2014  . Occult blood positive stool 01/30/2014  . Thrombocytopenia, unspecified (HCC) 01/06/2014  . Anemia, iron deficiency 01/06/2014  . Hip fracture, left (HCC) 01/03/2014  . QT prolongation 01/03/2014  . Atherosclerotic cardiovascular disease 08/13/2013  . Hypertensive emergency 04/08/2013  . Dizzy 04/08/2013  . PAT (paroxysmal atrial tachycardia) (HCC) 12/25/2012  . SSS (sick sinus syndrome) with PAT and marked bradycardia with 3 sec pauses   08/30/2012  . Status post placement of cardiac pacemaker. 08/29/12 St. Jude device 08/30/2012  . HTN (hypertension) 08/30/2012  . Hyperlipidemia 08/30/2012  . Osteoporosis, unspecified 04/19/2012  . Essential and other specified forms of tremor 04/19/2012  . Unspecified essential  hypertension 04/19/2012  . Cardiac dysrhythmia, unspecified 04/19/2012  . Chronic ischemic heart disease 04/19/2012  . Hereditary and idiopathic peripheral neuropathy 04/19/2012  . Memory loss 04/19/2012  . Disturbance of skin sensation 04/19/2012  . Pain in joint, shoulder region 11/20/2011  . Status post complete repair of rotator cuff 11/20/2011  . Muscle weakness (generalized) 11/20/2011    Lurena Nidamy B Robbi Spells, PTA/CLT [phone]  07/06/2015, 4:45 PM  [place] [facility] [address] [place], [place], [address] Phone: [phone]   Fax:  [phone]  Name: Pamela [name] MRN: [id number] Date of Birth: [birth year]

## 2015-07-08 ENCOUNTER — Encounter (HOSPITAL_COMMUNITY): Payer: Self-pay | Admitting: Physical Therapy

## 2015-07-14 ENCOUNTER — Ambulatory Visit (HOSPITAL_COMMUNITY): Payer: Medicare Other | Admitting: Physical Therapy

## 2015-07-14 DIAGNOSIS — R29898 Other symptoms and signs involving the musculoskeletal system: Secondary | ICD-10-CM

## 2015-07-14 DIAGNOSIS — Z9181 History of falling: Secondary | ICD-10-CM | POA: Diagnosis not present

## 2015-07-14 DIAGNOSIS — R6889 Other general symptoms and signs: Secondary | ICD-10-CM

## 2015-07-14 NOTE — Therapy (Signed)
[place] [address] [place], [place], [address] Phone: [phone]   Fax:  [phone]  Physical Therapy Treatment  Patient Details  Name: Pamela [name] MRN: [id number] Date of Birth: 11/13/1920 Referring Provider: Tawanna Cooler  Encounter Date: 07/14/2015      PT End of Session - 07/14/15 5456    Visit Number 15   Number of Visits 16   Authorization Type BCBS Medicare   PT Start Time 2563   PT Stop Time 1428   PT Time Calculation (min) 43 min   Equipment Utilized During Treatment Gait belt   Activity Tolerance Patient tolerated treatment well   Behavior During Therapy [facility] for tasks assessed/performed      Past Medical History  Diagnosis Date  . Pacemaker 08/29/2012    st jude accent DR RF device, model number [id number], serial number [id number], implanted 08/29/2012 for sinus bradycardia, runs of supraventricular tachycardia  last checked 10/23/2012  . ICD (implantable cardiac defibrillator) in place   . SSS (sick sinus syndrome) with PAT and marked bradycardia with 3 sec pauses  08/30/2012  . Status post placement of cardiac pacemaker. 08/29/12 St. Jude device 08/30/2012  . HTN (hypertension) 08/30/2012  . Hyperlipidemia 08/30/2012  . Polyneuropathy in other diseases classified elsewhere   . Macular degeneration   . Coronary artery disease     stent in the LAD artery in 2002  . Venous insufficiency     Past Surgical History  Procedure Laterality Date  . Cardiac catheterization  2006    3 stents placed  . Insert / replace / remove pacemaker  08/29/2012    st jude   . Rotator cuff repair Left   . Abdominal hysterectomy    . Cesarean section    . Kidney stone surgery    . Rotator cuff repair Right   . Cardiac catheterization  06/2001    placement of BiodivYsio 2.5.10mm stent dilated to 2.34m in proximal left anterior descenting stenotic lesion  . Radiofrequency ablation  08/25/2010    ablation of left greater saphenous vein   . Hip arthroplasty Left 01/04/2014    Procedure: ARTHROPLASTY BIPOLAR HIP;  Surgeon: MMauri Pole MD;  [facility]  Service: Orthopedics;  Laterality: Left;  . Permanent pacemaker insertion N/A 08/29/2012    Procedure: PERMANENT PACEMAKER INSERTION;  Surgeon: MSanda Klein MD;  [facility];  Service: Cardiovascular;  Laterality: N/A;    There were no vitals filed for this visit.  Visit Diagnosis:  Risk for falls  Weakness of both legs  Decreased functional activity tolerance      Subjective Assessment - 07/14/15 1348    Subjective Pt reports that she has still been having a lot of pain in her hip, and she has been giving more consideration to a total hip replacement. She reports that she feels that her balance has gotten much better since beginning therapy, and she has also noticed improvements with her walking. Her biggest limitation at this time is her hip pain.    Currently in Pain? Yes   Pain Score 5    Pain Location Leg   Pain Orientation Left   Pain Descriptors / Indicators Aching            [facility] PT Assessment - 07/14/15 0001    Observation/Other Assessments   Focus on Therapeutic Outcomes (FOTO)  37% limited   Strength   Right Hip Flexion 4/5   Right Hip Extension 2+/5  was 2+/5  Right Hip ABduction 4-/5   Left Hip Flexion 4/5   Left Hip Extension 3-/5  was 3-/5   Left Hip ABduction 4-/5   Right Knee Flexion 4+/5   Right Knee Extension 4+/5   Left Knee Flexion 4+/5   Left Knee Extension 4+/5   Transfers   Five time sit to stand comments  11.49"   Ambulation/Gait   Gait Comments TUG 14 seconds   6 minute walk test results    Aerobic Endurance Distance Walked 460   Endurance additional comments 3 minute walk test   [employer]   Sit to Stand Able to stand without using hands and stabilize independently   Standing Unsupported Able to stand safely 2 minutes   Sitting with Back Unsupported but Feet Supported on Floor or Stool Able to  sit safely and securely 2 minutes   Stand to Sit Sits safely with minimal use of hands   Transfers Able to transfer safely, minor use of hands   Standing Unsupported with Eyes Closed Able to stand 10 seconds safely   Standing Ubsupported with Feet Together Able to place feet together independently and stand 1 minute safely   From Standing, Reach Forward with Outstretched Arm Can reach confidently >25 cm (10")   From Standing Position, Pick up Object from Floor Able to pick up shoe safely and easily   From Standing Position, Turn to Look Behind Over each Shoulder Looks behind from both sides and weight shifts well   Turn 360 Degrees Able to turn 360 degrees safely in 4 seconds or less   Standing Unsupported, Alternately Place Feet on Step/Stool Able to stand independently and complete 8 steps >20 seconds   Standing Unsupported, One Foot in Front Able to take small step independently and hold 30 seconds   Standing on One Leg Able to lift leg independently and hold equal to or more than 3 seconds   Total Score 51                               PT Short Term Goals - 07/14/15 1642    PT SHORT TERM GOAL #1   Title Pt will be independent with HEP.   Time 3   Period Weeks   Status Achieved   PT SHORT TERM GOAL #2   Title Pt will complete TUG in 20 seconds or less with SPC to demonstrate decreased fall risk.   Time 3   Period Weeks   Status Achieved   PT SHORT TERM GOAL #3   Title Pt will complete five time sit to stand in 23 seconds to demonstrate improved BLE strength and power.    Time 3   Period Weeks   Status Achieved   PT SHORT TERM GOAL #4   Title Pt will ambulate 350 feet or greater during 3 minute walk test to demonstrate improved gait speed and functional activity tolerance.    Time 3   Period Weeks   Status Achieved           PT Long Term Goals - 07/14/15 1642    PT LONG TERM GOAL #1   Title Pt will complete TUG in 15 seconds or less without AD to  demonstrate decreased fall risk.   Time 6   Period Weeks   Status Achieved   PT LONG TERM GOAL #2   Title Pt will complete five time sit to stand in 18 seconds  or less to demonstrate improved BLE strength and power.    Time 6   Period Weeks   Status Achieved   PT LONG TERM GOAL #3   Title Pt will ambulate 450 feet during 3 minute walk test to demonstrate improved gait speed and functional activity tolerance.    Time 6   Period Weeks   Status Achieved   PT LONG TERM GOAL #4   Title Pt will maintain SLS x 15 seconds bilaterally to decrease fall risk.    Time 6   Period Weeks   Status Not Met               Plan - 2015-08-08 1644    Clinical Impression Statement Reassessment completed today. Pt has made excellent progress in regards to BLE strength, balance, gait speed, and functional activity tolerance. She has met 3/4 LTGs and has been compliant with her HEP. At this time, pt is appropriate to be discharged to continue independently with HEP.    PT Next Visit Plan pt discharged to HEP          G-Codes - 08-08-2015 1646    Functional Assessment Tool Used FOTO   Functional Limitation Mobility: Walking and moving around   Mobility: Walking and Moving Around Goal Status [phone]) At least 40 percent but less than 60 percent impaired, limited or restricted   Mobility: Walking and Moving Around Discharge Status [phone]) At least 40 percent but less than 60 percent impaired, limited or restricted      Problem List Patient Active Problem List   Diagnosis Date Noted  . DVT (deep venous thrombosis) ([place]) 01/30/2014  . Occult blood positive stool 01/30/2014  . Thrombocytopenia, unspecified ([place]) 01/06/2014  . Anemia, iron deficiency 01/06/2014  . Hip fracture, left ([place]) 01/03/2014  . QT prolongation 01/03/2014  . Atherosclerotic cardiovascular disease 08/13/2013  . Hypertensive emergency 04/08/2013  . Dizzy 04/08/2013  . PAT (paroxysmal atrial tachycardia) ([place]) 12/25/2012  .  SSS (sick sinus syndrome) with PAT and marked bradycardia with 3 sec pauses  08/30/2012  . Status post placement of cardiac pacemaker. 08/29/12 St. Jude device 08/30/2012  . HTN (hypertension) 08/30/2012  . Hyperlipidemia 08/30/2012  . Osteoporosis, unspecified 04/19/2012  . Essential and other specified forms of tremor 04/19/2012  . Unspecified essential hypertension 04/19/2012  . Cardiac dysrhythmia, unspecified 04/19/2012  . Chronic ischemic heart disease 04/19/2012  . Hereditary and idiopathic peripheral neuropathy 04/19/2012  . Memory loss 04/19/2012  . Disturbance of skin sensation 04/19/2012  . Pain in joint, shoulder region 11/20/2011  . Status post complete repair of rotator cuff 11/20/2011  . Muscle weakness (generalized) 11/20/2011    PHYSICAL THERAPY DISCHARGE SUMMARY  Visits from Start of Care: 15  Current functional level related to goals / functional outcomes: Pt demonstrates improvements in functional strength, gait speed, functional mobility, functional activity tolerance, and balance.     Remaining deficits: Pt continues to be unable to maintain SLS for greater than 6 seconds, however, she is at low risk for falls per Merrilee Jansky.    Education / Equipment: HEP  Plan: Patient agrees to discharge.  Patient goals were partially met. Patient is being discharged due to being pleased with the current functional level.  ?????       Hilma Favors, PT, DPT [phone] 2015/08/08, 4:47 PM  [place] [address] [place], [place], [address] Phone: [phone]   Fax:  [phone]  Name: Pamela [name] MRN: [id number] Date of  Birth: 26-Jan-1921

## 2015-07-23 ENCOUNTER — Emergency Department (HOSPITAL_COMMUNITY): Payer: Medicare Other

## 2015-07-23 ENCOUNTER — Inpatient Hospital Stay (HOSPITAL_COMMUNITY)
Admission: EM | Admit: 2015-07-23 | Discharge: 2015-07-29 | DRG: 467 | Disposition: A | Discharge status: Skilled Nursing Facility | Payer: Medicare Other | Attending: Internal Medicine | Admitting: Internal Medicine

## 2015-07-23 DIAGNOSIS — G629 Polyneuropathy, unspecified: Secondary | ICD-10-CM | POA: Diagnosis present

## 2015-07-23 DIAGNOSIS — Z950 Presence of cardiac pacemaker: Secondary | ICD-10-CM

## 2015-07-23 DIAGNOSIS — N189 Chronic kidney disease, unspecified: Secondary | ICD-10-CM | POA: Diagnosis present

## 2015-07-23 DIAGNOSIS — D62 Acute posthemorrhagic anemia: Secondary | ICD-10-CM | POA: Diagnosis not present

## 2015-07-23 DIAGNOSIS — Z09 Encounter for follow-up examination after completed treatment for conditions other than malignant neoplasm: Secondary | ICD-10-CM

## 2015-07-23 DIAGNOSIS — I2510 Atherosclerotic heart disease of native coronary artery without angina pectoris: Secondary | ICD-10-CM | POA: Diagnosis present

## 2015-07-23 DIAGNOSIS — M9702XA Periprosthetic fracture around internal prosthetic left hip joint, initial encounter: Secondary | ICD-10-CM | POA: Diagnosis not present

## 2015-07-23 DIAGNOSIS — Y92009 Unspecified place in unspecified non-institutional (private) residence as the place of occurrence of the external cause: Secondary | ICD-10-CM

## 2015-07-23 DIAGNOSIS — W010XXA Fall on same level from slipping, tripping and stumbling without subsequent striking against object, initial encounter: Secondary | ICD-10-CM | POA: Diagnosis present

## 2015-07-23 DIAGNOSIS — Z419 Encounter for procedure for purposes other than remedying health state, unspecified: Secondary | ICD-10-CM

## 2015-07-23 DIAGNOSIS — M25552 Pain in left hip: Secondary | ICD-10-CM | POA: Diagnosis not present

## 2015-07-23 DIAGNOSIS — E785 Hyperlipidemia, unspecified: Secondary | ICD-10-CM | POA: Diagnosis present

## 2015-07-23 DIAGNOSIS — Z955 Presence of coronary angioplasty implant and graft: Secondary | ICD-10-CM

## 2015-07-23 DIAGNOSIS — I471 Supraventricular tachycardia: Secondary | ICD-10-CM | POA: Diagnosis not present

## 2015-07-23 DIAGNOSIS — I10 Essential (primary) hypertension: Secondary | ICD-10-CM | POA: Diagnosis present

## 2015-07-23 DIAGNOSIS — R52 Pain, unspecified: Secondary | ICD-10-CM

## 2015-07-23 DIAGNOSIS — D696 Thrombocytopenia, unspecified: Secondary | ICD-10-CM | POA: Diagnosis present

## 2015-07-23 DIAGNOSIS — D509 Iron deficiency anemia, unspecified: Secondary | ICD-10-CM | POA: Diagnosis present

## 2015-07-23 DIAGNOSIS — S7292XA Unspecified fracture of left femur, initial encounter for closed fracture: Secondary | ICD-10-CM | POA: Diagnosis present

## 2015-07-23 DIAGNOSIS — I129 Hypertensive chronic kidney disease with stage 1 through stage 4 chronic kidney disease, or unspecified chronic kidney disease: Secondary | ICD-10-CM | POA: Diagnosis present

## 2015-07-23 DIAGNOSIS — W19XXXA Unspecified fall, initial encounter: Secondary | ICD-10-CM

## 2015-07-23 DIAGNOSIS — H3530 Unspecified macular degeneration: Secondary | ICD-10-CM | POA: Diagnosis present

## 2015-07-23 DIAGNOSIS — Z7982 Long term (current) use of aspirin: Secondary | ICD-10-CM

## 2015-07-23 MED ORDER — FENTANYL CITRATE (PF) 100 MCG/2ML IJ SOLN
25.0000 ug | Freq: Once | INTRAMUSCULAR | Status: AC
  Administered 2015-07-23: 25 ug via INTRAVENOUS
  Filled 2015-07-23: qty 2

## 2015-07-23 NOTE — ED Provider Notes (Signed)
CSN: [id number]     Arrival date & time 07/23/15  2306 History  By signing my name below, I, Phillis Haggis, attest that this documentation has been prepared under the direction and in the presence of Devoria Albe, MD at 2319. Electronically Signed: Phillis Haggis, ED Scribe. 07/23/2015. 12:42 AM.   Chief Complaint  Patient presents with  . Fall   The history is provided by the patient. No language interpreter was used.  HPI Comments: Pamela [name] is a 81 y.o. Female with a hx of venous insufficiency, SSS, HTN, polyneuropathy, macular degeneration, hx of partial hip replacement surgery in 2015, pacemaker in place, and CAD brought in by EMS who presents to the Emergency Department complaining of a fall onset PTA. Pt reports she had gotten up to go to the bathroom and she turned and lost her balance and fell. reports falling backwards at home on the way to her bathroom, hitting her back and lower legs. She reports having a LifeAlert but states that it did not work, so she had to pull herself to the telephone to call 911. Pt is complaining of left  leg pain near her left thigh, where she had partial hip replacement surgery last year. She denies hitting head, headache, LOC, or dizziness prior to falling. She states she has been talking to her orthopedist, Dr Charlann Boxer about getting the acetabular prosthesis inserted b/o continuing hip pain.  Pt states that she is primarily ambulatory by a cane and lives alone.   PCP: Trinna Post, MD  Past Medical History  Diagnosis Date  . Pacemaker 08/29/2012    st jude accent DR RF device, model number [id number], serial number [id number], implanted 08/29/2012 for sinus bradycardia, runs of supraventricular tachycardia  last checked 10/23/2012  . ICD (implantable cardiac defibrillator) in place   . SSS (sick sinus syndrome) with PAT and marked bradycardia with 3 sec pauses  08/30/2012  . Status post placement of cardiac pacemaker. 08/29/12 St. Jude device 08/30/2012   . HTN (hypertension) 08/30/2012  . Hyperlipidemia 08/30/2012  . Polyneuropathy in other diseases classified elsewhere   . Macular degeneration   . Coronary artery disease     stent in the LAD artery in 2002  . Venous insufficiency    Past Surgical History  Procedure Laterality Date  . Cardiac catheterization  2006    3 stents placed  . Insert / replace / remove pacemaker  08/29/2012    st jude   . Rotator cuff repair Left   . Abdominal hysterectomy    . Cesarean section    . Kidney stone surgery    . Rotator cuff repair Right   . Cardiac catheterization  06/2001    placement of BiodivYsio 2.5.10mm stent dilated to 2.13mm in proximal left anterior descenting stenotic lesion  . Radiofrequency ablation  08/25/2010    ablation of left greater saphenous vein  . Hip arthroplasty Left 01/04/2014    Procedure: ARTHROPLASTY BIPOLAR HIP;  Surgeon: Shelda Pal, MD;  Location: [facility] OR;  Service: Orthopedics;  Laterality: Left;  . Permanent pacemaker insertion N/A 08/29/2012    Procedure: PERMANENT PACEMAKER INSERTION;  Surgeon: Thurmon Fair, MD;  Location: [facility] CATH LAB;  Service: Cardiovascular;  Laterality: N/A;   Family History  Problem Relation Age of Onset  . Heart disease Mother   . Congestive Heart Failure Mother   . Heart disease Father   . Heart attack Father   . Heart disease Brother    Social History  Substance Use Topics  . Smoking status: Never Smoker   . Smokeless tobacco: Never Used  . Alcohol Use: No   Lives at home Lives alone Uses a cane  OB History    No data available     Review of Systems  Musculoskeletal: Positive for arthralgias.  Neurological: Negative for dizziness and syncope.  All other systems reviewed and are negative.  Allergies  Review of patient's allergies indicates no known allergies.  Home Medications   Prior to Admission medications   Medication Sig Start Date End Date Taking? Authorizing Provider  acetaminophen (TYLENOL) 500 MG  tablet Take 500 mg by mouth every 6 (six) hours as needed for mild pain or moderate pain.    Yes Historical Provider, MD  aspirin EC 81 MG tablet Take 81 mg by mouth every morning.    Yes Historical Provider, MD  atorvastatin (LIPITOR) 10 MG tablet TAKE ONE (1) TABLET BY MOUTH EVERY DAY 08/28/14  Yes Marinus MawGregg W Taylor, MD  carvedilol (COREG) 3.125 MG tablet Take 1 tablet (3.125 mg total) by mouth 2 (two) times daily. Patient taking differently: Take 3.125 mg by mouth every morning.  04/30/15  Yes Marinus MawGregg W Taylor, MD  Cholecalciferol (VITAMIN D-3 PO) Take 1 capsule by mouth daily.   Yes Historical Provider, MD  ezetimibe (ZETIA) 10 MG tablet Take 10 mg by mouth daily.   Yes Historical Provider, MD  ferrous sulfate 325 (65 FE) MG tablet Take 325 mg by mouth 2 (two) times a week.    Yes Historical Provider, MD  furosemide (LASIX) 20 MG tablet TAKE ONE TABLET BY MOUTH DAILY AS NEEDEDFOR EDEMA 01/22/15  Yes Marinus MawGregg W Taylor, MD  gabapentin (NEURONTIN) 100 MG capsule Take 100 mg by mouth at bedtime.   Yes Historical Provider, MD  Ibuprofen-Diphenhydramine HCl (ADVIL PM) 200-25 MG CAPS Take 1 capsule by mouth at bedtime.   Yes Historical Provider, MD  l-methylfolate-B6-B12 [facility](METANX) 3-35-2 MG TABS TAKE ONE TABLET TWICE DAILY 01/21/15  Yes York Spanielharles K Willis, MD  Multiple Vitamins-Minerals (OCUVITE PO) Take 1 tablet by mouth 2 (two) times daily.   Yes Historical Provider, MD  nitroGLYCERIN (NITROSTAT) 0.4 MG SL tablet Place 1 tablet (0.4 mg total) under the tongue every 5 (five) minutes as needed for chest pain. 06/01/15  Yes Marinus MawGregg W Taylor, MD  Polyethyl Glycol-Propyl Glycol (SYSTANE OP) Apply 1 drop to eye daily as needed (Dry Eyes).    Yes Historical Provider, MD  ramipril (ALTACE) 2.5 MG capsule Take 1 capsule (2.5 mg total) by mouth daily. 09/30/14  Yes Marinus MawGregg W Taylor, MD  vitamin B-12 (CYANOCOBALAMIN) 1000 MCG tablet Take 1,000 mcg by mouth daily.   Yes Historical Provider, MD   BP 157/73 mmHg  Pulse 93  Temp(Src)  97.7 F (36.5 C) (Oral)  Resp 20  Ht 5\' 5"  (1.651 m)  Wt 152 lb (68.947 kg)  BMI 25.29 kg/m2  SpO2 97%  Vital signs normal except hypertension  Physical Exam  Constitutional: She is oriented to person, place, and time. She appears well-developed and well-nourished.  Non-toxic appearance. She does not appear ill. No distress.  HENT:  Head: Normocephalic and atraumatic.  Right Ear: External ear normal.  Left Ear: External ear normal.  Nose: Nose normal. No mucosal edema or rhinorrhea.  Mouth/Throat: Oropharynx is clear and moist and mucous membranes are normal. No dental abscesses or uvula swelling.  Eyes: Conjunctivae and EOM are normal. Pupils are equal, round, and reactive to light.  Neck: Normal range of  motion and full passive range of motion without pain. Neck supple.  Cardiovascular: Normal rate, regular rhythm and normal heart sounds.  Exam reveals no gallop and no friction rub.   No murmur heard. Pulmonary/Chest: Effort normal and breath sounds normal. No respiratory distress. She has no wheezes. She has no rhonchi. She has no rales. She exhibits no tenderness and no crepitus.  Abdominal: Soft. Normal appearance and bowel sounds are normal. She exhibits no distension. There is no tenderness. There is no rebound and no guarding.  Musculoskeletal: Normal range of motion. She exhibits tenderness. She exhibits no edema.       Legs: Moves all extremities well except for LLE. External rotation of left leg with mild shortening. Pulses intact.   Neurological: She is alert and oriented to person, place, and time. She has normal strength. No cranial nerve deficit.  Skin: Skin is warm, dry and intact. No rash noted. No erythema. No pallor.  Psychiatric: She has a normal mood and affect. Her speech is normal and behavior is normal. Her mood appears not anxious.  Nursing note and vitals reviewed.   ED Course  Procedures (including critical care time)  Medications  fentaNYL (SUBLIMAZE)  injection 25 mcg (25 mcg Intravenous Given 07/23/15 2336)  fentaNYL (SUBLIMAZE) injection 50 mcg (50 mcg Intravenous Given 07/24/15 0023)  morphine 4 MG/ML injection 4 mg (4 mg Intravenous Given 07/24/15 0220)      DIAGNOSTIC STUDIES: Oxygen Saturation is 96% on RA, normal by my interpretation.    COORDINATION OF CARE: 11:26 PM-Discussed treatment plan which includes x-ray and pain medication with pt at bedside and pt agreed to plan.   12:47 AM- discussion of x-ray results with pt and family, she is wanting to continue care with [facility].  Her preop laboratory testing was ordered except for the hold clot since she will most likely be going to a different hospital. A Foley catheter was inserted since patient will not be able to be ambulatory to go to the bathroom.  1610 Dr Ranell Patrick, Orthopedics, wants her to be transferred to [facility],  NPO and he will try to get her to the OR today.  02:36 Dr Julian Reil, Hospitalist, will see patient for admission to [facility] to med-surg.   Pt was placed in a knee immobilizer that was pulled higher onto he thigh to stabilize her fracture for transport by [facility].     Labs Review Results for orders placed or performed during the hospital encounter of 07/23/15  Comprehensive metabolic panel  Result Value Ref Range   Sodium 144 135 - 145 mmol/L   Potassium 3.9 3.5 - 5.1 mmol/L   Chloride 107 101 - 111 mmol/L   CO2 29 22 - 32 mmol/L   Glucose, Bld 123 (H) 65 - 99 mg/dL   BUN 29 (H) 6 - 20 mg/dL   Creatinine, Ser 9.60 0.44 - 1.00 mg/dL   Calcium 9.4 8.9 - 45.4 mg/dL   Total Protein 6.3 (L) 6.5 - 8.1 g/dL   Albumin 4.2 3.5 - 5.0 g/dL   AST 32 15 - 41 U/L   ALT 22 14 - 54 U/L   Alkaline Phosphatase 42 38 - 126 U/L   Total Bilirubin 0.6 0.3 - 1.2 mg/dL   GFR calc non Af Amer 56 (L) >60 mL/min   GFR calc Af Amer >60 >60 mL/min   Anion gap 8 5 - 15  CBC with Differential  Result Value Ref Range   WBC 5.9 4.0 - 10.5 K/uL  RBC 3.80 (L) 3.87 - 5.11  MIL/uL   Hemoglobin 11.5 (L) 12.0 - 15.0 g/dL   HCT 96.0 45.4 - 09.8 %   MCV 95.8 78.0 - 100.0 fL   MCH 30.3 26.0 - 34.0 pg   MCHC 31.6 30.0 - 36.0 g/dL   RDW 11.9 14.7 - 82.9 %   Platelets 143 (L) 150 - 400 K/uL   Neutrophils Relative % 75 %   Neutro Abs 4.4 1.7 - 7.7 K/uL   Lymphocytes Relative 16 %   Lymphs Abs 1.0 0.7 - 4.0 K/uL   Monocytes Relative 7 %   Monocytes Absolute 0.4 0.1 - 1.0 K/uL   Eosinophils Relative 2 %   Eosinophils Absolute 0.1 0.0 - 0.7 K/uL   Basophils Relative 0 %   Basophils Absolute 0.0 0.0 - 0.1 K/uL  Protime-INR  Result Value Ref Range   Prothrombin Time 14.0 11.6 - 15.2 seconds   INR 1.06 0.00 - 1.49  Urinalysis, Routine w reflex microscopic (not at [facility])  Result Value Ref Range   Color, Urine YELLOW YELLOW   APPearance CLEAR CLEAR   Specific Gravity, Urine 1.015 1.005 - 1.030   pH 7.5 5.0 - 8.0   Glucose, UA NEGATIVE NEGATIVE mg/dL   Hgb urine dipstick NEGATIVE NEGATIVE   Bilirubin Urine NEGATIVE NEGATIVE   Ketones, ur NEGATIVE NEGATIVE mg/dL   Protein, ur NEGATIVE NEGATIVE mg/dL   Nitrite NEGATIVE NEGATIVE   Leukocytes, UA NEGATIVE NEGATIVE  APTT  Result Value Ref Range   aPTT 27 24 - 37 seconds    Laboratory interpretation all normal except mild anemia    Imaging Review Dg Chest 1 View  07/24/2015  CLINICAL DATA:  81 year old female with fall and left femur pain EXAM: CHEST 1 VIEW COMPARISON:  Chest radiograph dated 01/03/2014 FINDINGS: Single-view of the chest does not demonstrate a focal consolidation. There is no pleural effusion or pneumothorax. Stable cardiac silhouette. Left pectoral pacemaker device. Lower thoracic vertebroplasty changes noted. IMPRESSION: No active disease. Electronically Signed   By: Elgie Collard M.D.   On: 07/24/2015 00:14   Dg Femur Min 2 Views Left  07/24/2015  CLINICAL DATA:  Status post fall backwards at home, landing on left side, with left femur pain. Initial encounter. EXAM: LEFT FEMUR 2 VIEWS  COMPARISON:  None. FINDINGS: There is an oblique fracture extending across the left mid femur, arising at the distal aspect of the femoral stem of the patient's left hip hemiarthroplasty. There is up to 1 cm of anterior and distal displacement, without significant angulation. No additional fractures are seen. Diffuse sclerotic change is noted at the left knee. The soft tissues are difficult to fully assess on radiograph. IMPRESSION: Oblique fracture extending across the left mid femur, arising at the distal aspect of the femoral stem of the patient's left hip hemiarthroplasty. Up to 1 cm of anterior and distal displacement noted, without significant angulation. Electronically Signed   By: Roanna Raider M.D.   On: 07/24/2015 00:16   I have personally reviewed and evaluated these images and lab results as part of my medical decision-making.   EKG Interpretation   Date/Time:  Saturday July 24 2015 01:08:32 EST Ventricular Rate:  94 PR Interval:  187 QRS Duration: 87 QT Interval:  362 QTC Calculation: 453 R Axis:   -20 Text Interpretation:  Sinus rhythm Ventricular premature complex  Borderline left axis deviation Anteroseptal infarct, old Minimal ST  depression, inferior leads Baseline wander Since last tracing rate faster   (03 Jan 2014) Premature ventricular complexes are now Present Confirmed by  Zani Kyllonen  MD-I, Jashae Wiggs ([id number]) on 07/24/2015 1:49:43 AM      MDM   Final diagnoses:  Pain  Fall at home, initial encounter  Femur fracture, left, closed, initial encounter    Plan transfer to [facility] for admission.   I personally performed the services described in this documentation, which was scribed in my presence. The recorded information has been reviewed and considered.  Devoria Albe, MD, Concha Pyo, MD 07/24/15 [phone]

## 2015-07-23 NOTE — ED Notes (Signed)
Pt reports she fell at home, denies that she got dizzy or had any other symptoms prior.  Pt c/o pain to left lower leg.

## 2015-07-24 ENCOUNTER — Encounter (HOSPITAL_COMMUNITY): Payer: Self-pay | Admitting: Certified Registered Nurse Anesthetist

## 2015-07-24 ENCOUNTER — Inpatient Hospital Stay (HOSPITAL_COMMUNITY): Payer: Medicare Other | Admitting: Anesthesiology

## 2015-07-24 ENCOUNTER — Inpatient Hospital Stay (HOSPITAL_COMMUNITY): Payer: Medicare Other

## 2015-07-24 ENCOUNTER — Encounter (HOSPITAL_COMMUNITY)
Admission: EM | Disposition: A | Discharge status: Skilled Nursing Facility | Payer: Self-pay | Source: Home / Self Care | Attending: Internal Medicine

## 2015-07-24 DIAGNOSIS — H3530 Unspecified macular degeneration: Secondary | ICD-10-CM | POA: Diagnosis present

## 2015-07-24 DIAGNOSIS — M9702XD Periprosthetic fracture around internal prosthetic left hip joint, subsequent encounter: Secondary | ICD-10-CM | POA: Diagnosis not present

## 2015-07-24 DIAGNOSIS — M9702XA Periprosthetic fracture around internal prosthetic left hip joint, initial encounter: Secondary | ICD-10-CM | POA: Diagnosis present

## 2015-07-24 DIAGNOSIS — E785 Hyperlipidemia, unspecified: Secondary | ICD-10-CM | POA: Diagnosis present

## 2015-07-24 DIAGNOSIS — W19XXXD Unspecified fall, subsequent encounter: Secondary | ICD-10-CM | POA: Diagnosis not present

## 2015-07-24 DIAGNOSIS — I2510 Atherosclerotic heart disease of native coronary artery without angina pectoris: Secondary | ICD-10-CM | POA: Diagnosis present

## 2015-07-24 DIAGNOSIS — D696 Thrombocytopenia, unspecified: Secondary | ICD-10-CM | POA: Diagnosis present

## 2015-07-24 DIAGNOSIS — W19XXXA Unspecified fall, initial encounter: Secondary | ICD-10-CM | POA: Diagnosis present

## 2015-07-24 DIAGNOSIS — I10 Essential (primary) hypertension: Secondary | ICD-10-CM | POA: Diagnosis not present

## 2015-07-24 DIAGNOSIS — N182 Chronic kidney disease, stage 2 (mild): Secondary | ICD-10-CM | POA: Diagnosis not present

## 2015-07-24 DIAGNOSIS — S7292XA Unspecified fracture of left femur, initial encounter for closed fracture: Secondary | ICD-10-CM | POA: Diagnosis present

## 2015-07-24 DIAGNOSIS — G629 Polyneuropathy, unspecified: Secondary | ICD-10-CM | POA: Diagnosis present

## 2015-07-24 DIAGNOSIS — N183 Chronic kidney disease, stage 3 (moderate): Secondary | ICD-10-CM

## 2015-07-24 DIAGNOSIS — D62 Acute posthemorrhagic anemia: Secondary | ICD-10-CM | POA: Diagnosis not present

## 2015-07-24 DIAGNOSIS — M25552 Pain in left hip: Secondary | ICD-10-CM | POA: Diagnosis present

## 2015-07-24 DIAGNOSIS — D509 Iron deficiency anemia, unspecified: Secondary | ICD-10-CM | POA: Diagnosis present

## 2015-07-24 DIAGNOSIS — Z7982 Long term (current) use of aspirin: Secondary | ICD-10-CM | POA: Diagnosis not present

## 2015-07-24 DIAGNOSIS — N189 Chronic kidney disease, unspecified: Secondary | ICD-10-CM | POA: Diagnosis present

## 2015-07-24 DIAGNOSIS — Z950 Presence of cardiac pacemaker: Secondary | ICD-10-CM | POA: Diagnosis not present

## 2015-07-24 DIAGNOSIS — I471 Supraventricular tachycardia: Secondary | ICD-10-CM | POA: Diagnosis not present

## 2015-07-24 DIAGNOSIS — S72002D Fracture of unspecified part of neck of left femur, subsequent encounter for closed fracture with routine healing: Secondary | ICD-10-CM | POA: Diagnosis not present

## 2015-07-24 DIAGNOSIS — I129 Hypertensive chronic kidney disease with stage 1 through stage 4 chronic kidney disease, or unspecified chronic kidney disease: Secondary | ICD-10-CM | POA: Diagnosis present

## 2015-07-24 DIAGNOSIS — Y92009 Unspecified place in unspecified non-institutional (private) residence as the place of occurrence of the external cause: Secondary | ICD-10-CM

## 2015-07-24 DIAGNOSIS — W010XXA Fall on same level from slipping, tripping and stumbling without subsequent striking against object, initial encounter: Secondary | ICD-10-CM | POA: Diagnosis present

## 2015-07-24 DIAGNOSIS — Y92099 Unspecified place in other non-institutional residence as the place of occurrence of the external cause: Secondary | ICD-10-CM

## 2015-07-24 DIAGNOSIS — Z955 Presence of coronary angioplasty implant and graft: Secondary | ICD-10-CM | POA: Diagnosis not present

## 2015-07-24 HISTORY — PX: TOTAL HIP REVISION: SHX763

## 2015-07-24 LAB — PROTIME-INR
INR: 1.06 (ref 0.00–1.49)
Prothrombin Time: 14 seconds (ref 11.6–15.2)

## 2015-07-24 LAB — CBC WITH DIFFERENTIAL/PLATELET
Basophils Absolute: 0 10*3/uL (ref 0.0–0.1)
Basophils Relative: 0 %
Eosinophils Absolute: 0.1 10*3/uL (ref 0.0–0.7)
Eosinophils Relative: 2 %
HCT: 36.4 % (ref 36.0–46.0)
Hemoglobin: 11.5 g/dL — ABNORMAL LOW (ref 12.0–15.0)
LYMPHS PCT: 16 %
Lymphs Abs: 1 10*3/uL (ref 0.7–4.0)
MCH: 30.3 pg (ref 26.0–34.0)
MCHC: 31.6 g/dL (ref 30.0–36.0)
MCV: 95.8 fL (ref 78.0–100.0)
Monocytes Absolute: 0.4 10*3/uL (ref 0.1–1.0)
Monocytes Relative: 7 %
NEUTROS PCT: 75 %
Neutro Abs: 4.4 10*3/uL (ref 1.7–7.7)
PLATELETS: 143 10*3/uL — AB (ref 150–400)
RBC: 3.8 MIL/uL — ABNORMAL LOW (ref 3.87–5.11)
RDW: 13.7 % (ref 11.5–15.5)
WBC: 5.9 10*3/uL (ref 4.0–10.5)

## 2015-07-24 LAB — URINALYSIS, ROUTINE W REFLEX MICROSCOPIC
BILIRUBIN URINE: NEGATIVE
Glucose, UA: NEGATIVE mg/dL
HGB URINE DIPSTICK: NEGATIVE
Ketones, ur: NEGATIVE mg/dL
Leukocytes, UA: NEGATIVE
Nitrite: NEGATIVE
PH: 7.5 (ref 5.0–8.0)
Protein, ur: NEGATIVE mg/dL
SPECIFIC GRAVITY, URINE: 1.015 (ref 1.005–1.030)

## 2015-07-24 LAB — COMPREHENSIVE METABOLIC PANEL
ALT: 22 U/L (ref 14–54)
AST: 32 U/L (ref 15–41)
Albumin: 4.2 g/dL (ref 3.5–5.0)
Alkaline Phosphatase: 42 U/L (ref 38–126)
Anion gap: 8 (ref 5–15)
BILIRUBIN TOTAL: 0.6 mg/dL (ref 0.3–1.2)
BUN: 29 mg/dL — AB (ref 6–20)
CO2: 29 mmol/L (ref 22–32)
Calcium: 9.4 mg/dL (ref 8.9–10.3)
Chloride: 107 mmol/L (ref 101–111)
Creatinine, Ser: 0.87 mg/dL (ref 0.44–1.00)
GFR, EST NON AFRICAN AMERICAN: 56 mL/min — AB (ref >60)
Glucose, Bld: 123 mg/dL — ABNORMAL HIGH (ref 65–99)
POTASSIUM: 3.9 mmol/L (ref 3.5–5.1)
Sodium: 144 mmol/L (ref 135–145)
TOTAL PROTEIN: 6.3 g/dL — AB (ref 6.5–8.1)

## 2015-07-24 LAB — PREPARE RBC (CROSSMATCH)

## 2015-07-24 LAB — GLUCOSE, CAPILLARY: Glucose-Capillary: 174 mg/dL — ABNORMAL HIGH (ref 65–99)

## 2015-07-24 LAB — ABO/RH: ABO/RH(D): A NEG

## 2015-07-24 LAB — APTT: aPTT: 27 seconds (ref 24–37)

## 2015-07-24 SURGERY — TOTAL HIP REVISION
Anesthesia: General | Site: Hip | Laterality: Left

## 2015-07-24 MED ORDER — CEFAZOLIN SODIUM-DEXTROSE 2-3 GM-% IV SOLR
INTRAVENOUS | Status: AC
  Administered 2015-07-24: 2 g via INTRAVENOUS
  Filled 2015-07-24: qty 50

## 2015-07-24 MED ORDER — FENTANYL CITRATE (PF) 250 MCG/5ML IJ SOLN
INTRAMUSCULAR | Status: AC
  Filled 2015-07-24: qty 5

## 2015-07-24 MED ORDER — LACTATED RINGERS IV SOLN
INTRAVENOUS | Status: DC | PRN
  Administered 2015-07-24: 14:00:00 via INTRAVENOUS

## 2015-07-24 MED ORDER — CEFAZOLIN SODIUM-DEXTROSE 2-3 GM-% IV SOLR
2.0000 g | Freq: Once | INTRAVENOUS | Status: AC
  Administered 2015-07-24: 2 g via INTRAVENOUS

## 2015-07-24 MED ORDER — ACETAMINOPHEN 500 MG PO TABS: 500.0000 mg | ORAL_TABLET | Freq: Four times a day (QID) | ORAL | Status: DC | PRN

## 2015-07-24 MED ORDER — ACETAMINOPHEN 650 MG RE SUPP: 650.0000 mg | Freq: Four times a day (QID) | RECTAL | Status: DC | PRN

## 2015-07-24 MED ORDER — ATORVASTATIN CALCIUM 10 MG PO TABS
10.0000 mg | ORAL_TABLET | Freq: Every day | ORAL | Status: DC
  Administered 2015-07-25 – 2015-07-28 (×4): 10 mg via ORAL
  Filled 2015-07-24 (×4): qty 1

## 2015-07-24 MED ORDER — TRANEXAMIC ACID 1000 MG/10ML IV SOLN
2000.0000 mg | INTRAVENOUS | Status: DC
  Filled 2015-07-24: qty 20

## 2015-07-24 MED ORDER — CEFAZOLIN SODIUM-DEXTROSE 2-3 GM-% IV SOLR
2.0000 g | INTRAVENOUS | Status: DC
  Filled 2015-07-24: qty 50

## 2015-07-24 MED ORDER — SODIUM CHLORIDE 0.9 % IV SOLN
INTRAVENOUS | Status: DC | PRN
  Administered 2015-07-24: 14:00:00 via INTRAVENOUS

## 2015-07-24 MED ORDER — MEPERIDINE HCL 25 MG/ML IJ SOLN: 6.2500 mg | INTRAMUSCULAR | Status: DC | PRN

## 2015-07-24 MED ORDER — LACTATED RINGERS IV SOLN
INTRAVENOUS | Status: DC
  Administered 2015-07-24: 14:00:00 via INTRAVENOUS
  Administered 2015-07-24: 20 mL/h via INTRAVENOUS

## 2015-07-24 MED ORDER — DEXAMETHASONE SODIUM PHOSPHATE 4 MG/ML IJ SOLN
INTRAMUSCULAR | Status: AC
  Filled 2015-07-24: qty 2

## 2015-07-24 MED ORDER — CARVEDILOL 3.125 MG PO TABS
3.1250 mg | ORAL_TABLET | Freq: Every morning | ORAL | Status: DC
  Administered 2015-07-24 – 2015-07-29 (×6): 3.125 mg via ORAL
  Filled 2015-07-24 (×6): qty 1

## 2015-07-24 MED ORDER — PROPOFOL 10 MG/ML IV BOLUS
INTRAVENOUS | Status: DC | PRN
  Administered 2015-07-24: 130 mg via INTRAVENOUS

## 2015-07-24 MED ORDER — SODIUM CHLORIDE 0.9 % IV SOLN: 10.0000 mL/h | Freq: Once | INTRAVENOUS | Status: DC

## 2015-07-24 MED ORDER — ASPIRIN EC 81 MG PO TBEC: 81.0000 mg | DELAYED_RELEASE_TABLET | Freq: Every morning | ORAL | Status: DC

## 2015-07-24 MED ORDER — ALBUMIN HUMAN 5 % IV SOLN
INTRAVENOUS | Status: DC | PRN
  Administered 2015-07-24 (×2): via INTRAVENOUS

## 2015-07-24 MED ORDER — ENOXAPARIN SODIUM 30 MG/0.3ML ~~LOC~~ SOLN
30.0000 mg | SUBCUTANEOUS | Status: DC
  Administered 2015-07-25 – 2015-07-26 (×2): 30 mg via SUBCUTANEOUS
  Filled 2015-07-24 (×4): qty 0.3

## 2015-07-24 MED ORDER — ACETAMINOPHEN 325 MG PO TABS
650.0000 mg | ORAL_TABLET | Freq: Four times a day (QID) | ORAL | Status: DC | PRN
  Administered 2015-07-25 – 2015-07-29 (×4): 650 mg via ORAL
  Filled 2015-07-24 (×5): qty 2

## 2015-07-24 MED ORDER — DEXAMETHASONE SODIUM PHOSPHATE 4 MG/ML IJ SOLN
INTRAMUSCULAR | Status: DC | PRN
  Administered 2015-07-24: 4 mg via INTRAVENOUS

## 2015-07-24 MED ORDER — 0.9 % SODIUM CHLORIDE (POUR BTL) OPTIME
TOPICAL | Status: DC | PRN
  Administered 2015-07-24: 1000 mL

## 2015-07-24 MED ORDER — HYDROMORPHONE HCL 1 MG/ML IJ SOLN
0.2500 mg | INTRAMUSCULAR | Status: DC | PRN
  Administered 2015-07-24 (×3): 0.5 mg via INTRAVENOUS

## 2015-07-24 MED ORDER — LIDOCAINE HCL (CARDIAC) 20 MG/ML IV SOLN
INTRAVENOUS | Status: DC | PRN
  Administered 2015-07-24: 60 mg via INTRAVENOUS

## 2015-07-24 MED ORDER — HEPARIN SODIUM (PORCINE) 5000 UNIT/ML IJ SOLN
5000.0000 [IU] | Freq: Three times a day (TID) | INTRAMUSCULAR | Status: DC
  Administered 2015-07-24: 5000 [IU] via SUBCUTANEOUS
  Filled 2015-07-24: qty 1

## 2015-07-24 MED ORDER — PHENYLEPHRINE HCL 10 MG/ML IJ SOLN
INTRAMUSCULAR | Status: DC | PRN
  Administered 2015-07-24 (×3): 80 ug via INTRAVENOUS

## 2015-07-24 MED ORDER — EPHEDRINE SULFATE 50 MG/ML IJ SOLN
INTRAMUSCULAR | Status: DC | PRN
  Administered 2015-07-24 (×2): 10 mg via INTRAVENOUS

## 2015-07-24 MED ORDER — FENTANYL CITRATE (PF) 100 MCG/2ML IJ SOLN
INTRAMUSCULAR | Status: DC | PRN
  Administered 2015-07-24: 50 ug via INTRAVENOUS
  Administered 2015-07-24: 100 ug via INTRAVENOUS
  Administered 2015-07-24 (×3): 50 ug via INTRAVENOUS

## 2015-07-24 MED ORDER — PHENOL 1.4 % MT LIQD: 1.0000 | OROMUCOSAL | Status: DC | PRN

## 2015-07-24 MED ORDER — MORPHINE SULFATE (PF) 2 MG/ML IV SOLN
0.5000 mg | INTRAVENOUS | Status: DC | PRN
  Administered 2015-07-25 (×6): 0.5 mg via INTRAVENOUS
  Filled 2015-07-24 (×6): qty 1

## 2015-07-24 MED ORDER — TRANEXAMIC ACID 1000 MG/10ML IV SOLN
1000.0000 mg | INTRAVENOUS | Status: DC
  Filled 2015-07-24: qty 10

## 2015-07-24 MED ORDER — ONDANSETRON HCL 4 MG/2ML IJ SOLN: 4.0000 mg | Freq: Three times a day (TID) | INTRAMUSCULAR | Status: DC | PRN

## 2015-07-24 MED ORDER — OXYCODONE HCL 5 MG/5ML PO SOLN: 5.0000 mg | Freq: Once | ORAL | Status: DC | PRN

## 2015-07-24 MED ORDER — SUCCINYLCHOLINE CHLORIDE 20 MG/ML IJ SOLN
INTRAMUSCULAR | Status: DC | PRN
  Administered 2015-07-24: 100 mg via INTRAVENOUS

## 2015-07-24 MED ORDER — NITROGLYCERIN 0.4 MG SL SUBL: 0.4000 mg | SUBLINGUAL_TABLET | SUBLINGUAL | Status: DC | PRN

## 2015-07-24 MED ORDER — TRANEXAMIC ACID 1000 MG/10ML IV SOLN
2000.0000 mg | INTRAVENOUS | Status: DC | PRN
  Administered 2015-07-24: 2000 mg via INTRAVENOUS

## 2015-07-24 MED ORDER — SODIUM CHLORIDE 0.9 % IV SOLN: Freq: Once | INTRAVENOUS | Status: DC

## 2015-07-24 MED ORDER — OXYCODONE HCL 5 MG PO TABS: 5.0000 mg | ORAL_TABLET | Freq: Once | ORAL | Status: DC | PRN

## 2015-07-24 MED ORDER — MORPHINE SULFATE (PF) 4 MG/ML IV SOLN
4.0000 mg | Freq: Once | INTRAVENOUS | Status: AC
Start: 2015-07-24 — End: 2015-07-24
  Administered 2015-07-24: 4 mg via INTRAVENOUS
  Filled 2015-07-24: qty 1

## 2015-07-24 MED ORDER — MENTHOL 3 MG MT LOZG: 1.0000 | LOZENGE | OROMUCOSAL | Status: DC | PRN

## 2015-07-24 MED ORDER — HYPROMELLOSE (GONIOSCOPIC) 2.5 % OP SOLN
1.0000 [drp] | Freq: Every day | OPHTHALMIC | Status: DC | PRN
Start: 2015-07-24 — End: 2015-07-29
  Filled 2015-07-24: qty 15

## 2015-07-24 MED ORDER — MORPHINE SULFATE (PF) 4 MG/ML IV SOLN
4.0000 mg | Freq: Once | INTRAVENOUS | Status: AC
Start: 2015-07-24 — End: 2015-07-24
  Administered 2015-07-24: 4 mg via INTRAVENOUS

## 2015-07-24 MED ORDER — FENTANYL CITRATE (PF) 100 MCG/2ML IJ SOLN
50.0000 ug | Freq: Once | INTRAMUSCULAR | Status: AC
  Administered 2015-07-24: 50 ug via INTRAVENOUS
  Filled 2015-07-24: qty 2

## 2015-07-24 MED ORDER — ONDANSETRON HCL 4 MG PO TABS: 4.0000 mg | ORAL_TABLET | Freq: Four times a day (QID) | ORAL | Status: DC | PRN

## 2015-07-24 MED ORDER — METOCLOPRAMIDE HCL 5 MG PO TABS: 5.0000 mg | ORAL_TABLET | Freq: Three times a day (TID) | ORAL | Status: DC | PRN

## 2015-07-24 MED ORDER — HYDROMORPHONE HCL 1 MG/ML IJ SOLN
INTRAMUSCULAR | Status: AC
  Filled 2015-07-24: qty 1

## 2015-07-24 MED ORDER — SODIUM CHLORIDE 0.9 % IR SOLN
Status: DC | PRN
  Administered 2015-07-24: 3000 mL

## 2015-07-24 MED ORDER — SODIUM CHLORIDE 0.9 % IV SOLN
INTRAVENOUS | Status: DC
  Administered 2015-07-24 – 2015-07-25 (×2): via INTRAVENOUS

## 2015-07-24 MED ORDER — LACTATED RINGERS IV SOLN
INTRAVENOUS | Status: DC | PRN
  Administered 2015-07-24 (×3): via INTRAVENOUS

## 2015-07-24 MED ORDER — HYDROMORPHONE HCL 1 MG/ML IJ SOLN
INTRAMUSCULAR | Status: AC
Start: 2015-07-24 — End: 2015-07-25
  Filled 2015-07-24: qty 1

## 2015-07-24 MED ORDER — MORPHINE SULFATE (PF) 4 MG/ML IV SOLN
INTRAVENOUS | Status: AC
  Filled 2015-07-24: qty 1

## 2015-07-24 MED ORDER — MORPHINE SULFATE (PF) 2 MG/ML IV SOLN
2.0000 mg | INTRAVENOUS | Status: DC | PRN
Start: 2015-07-24 — End: 2015-07-24
  Administered 2015-07-24 (×2): 2 mg via INTRAVENOUS
  Filled 2015-07-24 (×2): qty 1

## 2015-07-24 MED ORDER — CEFAZOLIN SODIUM-DEXTROSE 2-3 GM-% IV SOLR
2.0000 g | Freq: Four times a day (QID) | INTRAVENOUS | Status: AC
  Administered 2015-07-25 (×2): 2 g via INTRAVENOUS
  Filled 2015-07-24 (×2): qty 50

## 2015-07-24 MED ORDER — ONDANSETRON HCL 4 MG/2ML IJ SOLN
4.0000 mg | Freq: Four times a day (QID) | INTRAMUSCULAR | Status: DC | PRN
  Administered 2015-07-25: 4 mg via INTRAVENOUS
  Filled 2015-07-24: qty 2

## 2015-07-24 MED ORDER — HYDROCODONE-ACETAMINOPHEN 5-325 MG PO TABS
1.0000 | ORAL_TABLET | Freq: Four times a day (QID) | ORAL | Status: DC | PRN
  Administered 2015-07-25 – 2015-07-26 (×5): 2 via ORAL
  Administered 2015-07-27 – 2015-07-29 (×6): 1 via ORAL
  Filled 2015-07-24: qty 1
  Filled 2015-07-24 (×4): qty 2
  Filled 2015-07-24 (×2): qty 1
  Filled 2015-07-24 (×2): qty 2
  Filled 2015-07-24 (×3): qty 1

## 2015-07-24 MED ORDER — MORPHINE SULFATE (PF) 2 MG/ML IV SOLN
4.0000 mg | INTRAVENOUS | Status: DC | PRN
  Administered 2015-07-24: 4 mg via INTRAVENOUS
  Filled 2015-07-24: qty 2

## 2015-07-24 MED ORDER — METOCLOPRAMIDE HCL 5 MG/ML IJ SOLN: 5.0000 mg | Freq: Three times a day (TID) | INTRAMUSCULAR | Status: DC | PRN

## 2015-07-24 MED ORDER — DEXTROSE 5 % IV SOLN
10.0000 mg | INTRAVENOUS | Status: DC | PRN
  Administered 2015-07-24: 20 ug/min via INTRAVENOUS

## 2015-07-24 SURGICAL SUPPLY — 85 items
ADH SKN CLS APL DERMABOND .7 (GAUZE/BANDAGES/DRESSINGS) ×4
BAG DECANTER FOR FLEXI CONT (MISCELLANEOUS) ×2 IMPLANT
BIT DRILL LOCK 4.3 (BIT) ×1 IMPLANT
BIT DRILL NLOCK SHRT 3.2X216 (BIT) ×1 IMPLANT
BLADE SAW SGTL 18X1.27X75 (BLADE) ×2 IMPLANT
COVER MAYO STAND STRL (DRAPES) ×1 IMPLANT
COVER SURGICAL LIGHT HANDLE (MISCELLANEOUS) ×2 IMPLANT
DERMABOND ADVANCED (GAUZE/BANDAGES/DRESSINGS) ×4
DERMABOND ADVANCED .7 DNX12 (GAUZE/BANDAGES/DRESSINGS) IMPLANT
DRAPE HIP W/POCKET STRL (DRAPE) ×2 IMPLANT
DRAPE INCISE IOBAN 66X45 STRL (DRAPES) ×2 IMPLANT
DRAPE INCISE IOBAN 85X60 (DRAPES) ×2 IMPLANT
DRAPE POUCH INSTRU U-SHP 10X18 (DRAPES) ×2 IMPLANT
DRAPE STERI IOBAN 125X83 (DRAPES) ×2 IMPLANT
DRAPE SURG 17X11 SM STRL (DRAPES) ×2 IMPLANT
DRAPE U-SHAPE 47X51 STRL (DRAPES) ×2 IMPLANT
DRSG AQUACEL AG ADV 3.5X10 (GAUZE/BANDAGES/DRESSINGS) ×1 IMPLANT
DRSG AQUACEL AG ADV 3.5X14 (GAUZE/BANDAGES/DRESSINGS) ×2 IMPLANT
DRSG TEGADERM 4X4.75 (GAUZE/BANDAGES/DRESSINGS) ×1 IMPLANT
DURAPREP 26ML APPLICATOR (WOUND CARE) ×1 IMPLANT
ELECT BLADE 6.5 EXT (BLADE) ×1 IMPLANT
ELECT BLADE TIP CTD 4 INCH (ELECTRODE) ×2 IMPLANT
ELECT CAUTERY BLADE 6.4 (BLADE) ×1 IMPLANT
ELECT REM PT RETURN 9FT ADLT (ELECTROSURGICAL) ×2
ELECTRODE REM PT RTRN 9FT ADLT (ELECTROSURGICAL) ×1 IMPLANT
FACESHIELD WRAPAROUND (MASK) ×2 IMPLANT
FACESHIELD WRAPAROUND OR TEAM (MASK) ×4 IMPLANT
GLOVE BIO SURGEON STRL SZ7 (GLOVE) ×2 IMPLANT
GLOVE BIO SURGEON STRL SZ7.5 (GLOVE) ×2 IMPLANT
GLOVE BIO SURGEON STRL SZ8 (GLOVE) ×4 IMPLANT
GLOVE BIO SURGEON STRL SZ8.5 (GLOVE) ×5 IMPLANT
GLOVE BIOGEL M STER SZ 6 (GLOVE) ×1 IMPLANT
GLOVE BIOGEL PI IND STRL 7.0 (GLOVE) IMPLANT
GLOVE BIOGEL PI IND STRL 8.5 (GLOVE) ×2 IMPLANT
GLOVE BIOGEL PI INDICATOR 7.0 (GLOVE) ×5
GLOVE BIOGEL PI INDICATOR 8.5 (GLOVE) ×4
GOWN SPEC L3 XXLG W/TWL (GOWN DISPOSABLE) ×4 IMPLANT
GOWN STRL REUS W/TWL 2XL LVL3 (GOWN DISPOSABLE) ×2 IMPLANT
GUIDEROD T2 3X1000 (ROD) ×1 IMPLANT
HANDPIECE INTERPULSE COAX TIP (DISPOSABLE) ×4
HEAD CERAMIC 36 PLUS5 (Hips) ×1 IMPLANT
K-WIRE SMOOTH 2.0X150 (WIRE) ×2
KIT BASIN OR (CUSTOM PROCEDURE TRAY) ×2 IMPLANT
KWIRE SMOOTH 2.0X150 (WIRE) IMPLANT
LINER NEUTRAL 52X36MM PLUS 4 (Liner) ×1 IMPLANT
MANIFOLD NEPTUNE II (INSTRUMENTS) ×2 IMPLANT
MARKER SKIN DUAL TIP RULER LAB (MISCELLANEOUS) ×1 IMPLANT
MARKER SKIN SURG 5.25 VIO NS (MISCELLANEOUS) ×1 IMPLANT
NDL SAFETY ECLIPSE 18X1.5 (NEEDLE) ×1 IMPLANT
NEEDLE HYPO 18GX1.5 SHARP (NEEDLE) ×2
NS IRRIG 1000ML POUR BTL (IV SOLUTION) ×2 IMPLANT
PACK TOTAL JOINT (CUSTOM PROCEDURE TRAY) ×2 IMPLANT
PIN SECTOR W/GRIP ACE CUP 52MM (Hips) ×1 IMPLANT
PLATE FEMUR DISTAL LT 16HOLE (Plate) ×1 IMPLANT
RECLAIM PRX BDY CONE 20X85 (Hips) ×1 IMPLANT
RETRACTOR YANK SUCT EIGR SABER (INSTRUMENTS) ×1 IMPLANT
SCREW 6.5MMX25MM (Screw) ×1 IMPLANT
SCREW 6.5MMX30MM (Screw) ×1 IMPLANT
SCREW CANCELLOUS 6.0X65MM (Screw) ×1 IMPLANT
SCREW CORTICAL 4.5X36MM (Screw) ×1 IMPLANT
SCREW CORTICAL 4.5X40MM (Screw) ×1 IMPLANT
SCREW LOCK 70X5XSTNS TI (Screw) IMPLANT
SCREW LOCKING 5.0MMX34MM (Screw) ×1 IMPLANT
SCREW LOCKING 5.0MMX65MM (Screw) ×1 IMPLANT
SCREW LOCKING 5.0X70MM (Screw) ×2 IMPLANT
SET HNDPC FAN SPRY TIP SCT (DISPOSABLE) IMPLANT
SLEEVE CABLE 2MM VT (Orthopedic Implant) ×2 IMPLANT
STAPLER VISISTAT 35W (STAPLE) ×5 IMPLANT
STEM RECLAIM STRAIGHT 17X190 (Stem) ×1 IMPLANT
SUCTION FRAZIER TIP 10 FR DISP (SUCTIONS) ×2 IMPLANT
SUT ETHIBOND 2 V 37 (SUTURE) ×4 IMPLANT
SUT MNCRL AB 4-0 PS2 18 (SUTURE) ×2 IMPLANT
SUT MON AB 2-0 CT1 36 (SUTURE) ×4 IMPLANT
SUT STEEL 5 (SUTURE) ×3 IMPLANT
SUT VIC AB 1 CT1 27 (SUTURE) ×8
SUT VIC AB 1 CT1 27XBRD ANBCTR (SUTURE) ×1 IMPLANT
SUT VIC AB 2-0 CT1 27 (SUTURE) ×2
SUT VIC AB 2-0 CT1 TAPERPNT 27 (SUTURE) ×1 IMPLANT
SUT VLOC 180 0 24IN GS25 (SUTURE) ×4 IMPLANT
SYR 50ML LL SCALE MARK (SYRINGE) ×2 IMPLANT
TOWEL OR 17X26 10 PK STRL BLUE (TOWEL DISPOSABLE) ×4 IMPLANT
TRAY CATH 16FR W/PLASTIC CATH (SET/KITS/TRAYS/PACK) IMPLANT
TRAY FOLEY CATH 16FRSI W/METER (SET/KITS/TRAYS/PACK) IMPLANT
TUBE CONNECTING 20X1/4 (TUBING) ×3 IMPLANT
guide wire ball tipped 03x1000 (Wire) ×1 IMPLANT

## 2015-07-24 NOTE — Consult Note (Signed)
Reason for Consult:peri prosthetic left femur fracture Referring Physician: Smith  Pamela [name] is an 81 y.o. female.  HPI: 81 yo female who presents in transfer from [facility] after a ground level fall with a left hip injury.  Patient unable to stand or ambulate after the injury. She complains of severe left thigh pain. XRAYs at [facility] demonstrate a spiral peri prosthetic left femur fracture with an apparently stable stem. Patient sees Dr Matt Olin in our practice.  Past Medical History  Diagnosis Date  . Pacemaker 08/29/2012    st jude accent DR RF device, model number [id number], serial number [id number], implanted 08/29/2012 for sinus bradycardia, runs of supraventricular tachycardia  last checked 10/23/2012  . ICD (implantable cardiac defibrillator) in place   . SSS (sick sinus syndrome) with PAT and marked bradycardia with 3 sec pauses  08/30/2012  . Status post placement of cardiac pacemaker. 08/29/12 St. Jude device 08/30/2012  . HTN (hypertension) 08/30/2012  . Hyperlipidemia 08/30/2012  . Polyneuropathy in other diseases classified elsewhere   . Macular degeneration   . Coronary artery disease     stent in the LAD artery in 2002  . Venous insufficiency     Past Surgical History  Procedure Laterality Date  . Cardiac catheterization  2006    3 stents placed  . Insert / replace / remove pacemaker  08/29/2012    st jude   . Rotator cuff repair Left   . Abdominal hysterectomy    . Cesarean section    . Kidney stone surgery    . Rotator cuff repair Right   . Cardiac catheterization  06/2001    placement of BiodivYsio 2.5.10mm stent dilated to 2.75mm in proximal left anterior descenting stenotic lesion  . Radiofrequency ablation  08/25/2010    ablation of left greater saphenous vein  . Hip arthroplasty Left 01/04/2014    Procedure: ARTHROPLASTY BIPOLAR HIP;  Surgeon: Matthew D Olin, MD;  Location: [facility] OR;  Service: Orthopedics;  Laterality: Left;  . Permanent pacemaker insertion N/A 08/29/2012     Procedure: PERMANENT PACEMAKER INSERTION;  Surgeon: Mihai Croitoru, MD;  Location: [facility] CATH LAB;  Service: Cardiovascular;  Laterality: N/A;    Family History  Problem Relation Age of Onset  . Heart disease Mother   . Congestive Heart Failure Mother   . Heart disease Father   . Heart attack Father   . Heart disease Brother     Social History:  reports that she has never smoked. She has never used smokeless tobacco. She reports that she does not drink alcohol or use illicit drugs.  Allergies: No Known Allergies  Medications: I have reviewed the patient's current medications.  Results for orders placed or performed during the hospital encounter of 07/23/15 (from the past 48 hour(s))  Comprehensive metabolic panel     Status: Abnormal   Collection Time: 07/24/15  1:05 AM  Result Value Ref Range   Sodium 144 135 - 145 mmol/L   Potassium 3.9 3.5 - 5.1 mmol/L   Chloride 107 101 - 111 mmol/L   CO2 29 22 - 32 mmol/L   Glucose, Bld 123 (H) 65 - 99 mg/dL   BUN 29 (H) 6 - 20 mg/dL   Creatinine, Ser 0.87 0.44 - 1.00 mg/dL   Calcium 9.4 8.9 - 10.3 mg/dL   Total Protein 6.3 (L) 6.5 - 8.1 g/dL   Albumin 4.2 3.5 - 5.0 g/dL   AST 32 15 - 41 U/L   ALT 22 14 -   54 U/L   Alkaline Phosphatase 42 38 - 126 U/L   Total Bilirubin 0.6 0.3 - 1.2 mg/dL   GFR calc non Af Amer 56 (L) >60 mL/min   GFR calc Af Amer >60 >60 mL/min    Comment: (NOTE) The eGFR has been calculated using the CKD EPI equation. This calculation has not been validated in all clinical situations. eGFR's persistently <60 mL/min signify possible Chronic Kidney Disease.    Anion gap 8 5 - 15  CBC with Differential     Status: Abnormal   Collection Time: 07/24/15  1:05 AM  Result Value Ref Range   WBC 5.9 4.0 - 10.5 K/uL   RBC 3.80 (L) 3.87 - 5.11 MIL/uL   Hemoglobin 11.5 (L) 12.0 - 15.0 g/dL   HCT 36.4 36.0 - 46.0 %   MCV 95.8 78.0 - 100.0 fL   MCH 30.3 26.0 - 34.0 pg   MCHC 31.6 30.0 - 36.0 g/dL   RDW 13.7 11.5 - 15.5  %   Platelets 143 (L) 150 - 400 K/uL   Neutrophils Relative % 75 %   Neutro Abs 4.4 1.7 - 7.7 K/uL   Lymphocytes Relative 16 %   Lymphs Abs 1.0 0.7 - 4.0 K/uL   Monocytes Relative 7 %   Monocytes Absolute 0.4 0.1 - 1.0 K/uL   Eosinophils Relative 2 %   Eosinophils Absolute 0.1 0.0 - 0.7 K/uL   Basophils Relative 0 %   Basophils Absolute 0.0 0.0 - 0.1 K/uL  Protime-INR     Status: None   Collection Time: 07/24/15  1:05 AM  Result Value Ref Range   Prothrombin Time 14.0 11.6 - 15.2 seconds   INR 1.06 0.00 - 1.49  APTT     Status: None   Collection Time: 07/24/15  1:05 AM  Result Value Ref Range   aPTT 27 24 - 37 seconds  Urinalysis, Routine w reflex microscopic (not at [facility])     Status: None   Collection Time: 07/24/15  1:30 AM  Result Value Ref Range   Color, Urine YELLOW YELLOW   APPearance CLEAR CLEAR   Specific Gravity, Urine 1.015 1.005 - 1.030   pH 7.5 5.0 - 8.0   Glucose, UA NEGATIVE NEGATIVE mg/dL   Hgb urine dipstick NEGATIVE NEGATIVE   Bilirubin Urine NEGATIVE NEGATIVE   Ketones, ur NEGATIVE NEGATIVE mg/dL   Protein, ur NEGATIVE NEGATIVE mg/dL   Nitrite NEGATIVE NEGATIVE   Leukocytes, UA NEGATIVE NEGATIVE    Comment: MICROSCOPIC NOT DONE ON URINES WITH NEGATIVE PROTEIN, BLOOD, LEUKOCYTES, NITRITE, OR GLUCOSE <1000 mg/dL.    Dg Chest 1 View  07/24/2015  CLINICAL DATA:  81-year-old female with fall and left femur pain EXAM: CHEST 1 VIEW COMPARISON:  Chest radiograph dated 01/03/2014 FINDINGS: Single-view of the chest does not demonstrate a focal consolidation. There is no pleural effusion or pneumothorax. Stable cardiac silhouette. Left pectoral pacemaker device. Lower thoracic vertebroplasty changes noted. IMPRESSION: No active disease. Electronically Signed   By: Arash  Radparvar M.D.   On: 07/24/2015 00:14   Dg Femur Min 2 Views Left  07/24/2015  CLINICAL DATA:  Status post fall backwards at home, landing on left side, with left femur pain. Initial encounter.  EXAM: LEFT FEMUR 2 VIEWS COMPARISON:  None. FINDINGS: There is an oblique fracture extending across the left mid femur, arising at the distal aspect of the femoral stem of the patient's left hip hemiarthroplasty. There is up to 1 cm of anterior and distal displacement, without   significant angulation. No additional fractures are seen. Diffuse sclerotic change is noted at the left knee. The soft tissues are difficult to fully assess on radiograph. IMPRESSION: Oblique fracture extending across the left mid femur, arising at the distal aspect of the femoral stem of the patient's left hip hemiarthroplasty. Up to 1 cm of anterior and distal displacement noted, without significant angulation. Electronically Signed   By: Jeffery  Chang M.D.   On: 07/24/2015 00:16    ROS Blood pressure 166/65, pulse 80, temperature 98 F (36.7 C), temperature source Oral, resp. rate 18, height 5' 5" (1.651 m), weight 68.947 kg (152 lb), SpO2 98 %. Physical Exam AAO moderate distress. Neck non-tender with no pain with AROM, bilateral UEs with pain free AROM and 5/5 motor strength.  Chest non tender with normal excursion. Abdomen soft. Right LE with no pain with full AROM, left LE is shortened but immobilized in a knee immobilizer. She is NVI in the left LE, extremity is well perfused and alignment is reasonable.  Assessment/Plan: Left peri-prosthetic femur fracture.  Will require ORIF.  I have reached out to Dr Matt Olin for his thoughts and have consulted Dr Brian Swinteck, a hip arthroplasty specialist for his assistance. Maintain NPO, plan to follow.  Laketia Vicknair,STEVEN R 07/24/2015, 8:00 AM      

## 2015-07-24 NOTE — Transfer of Care (Signed)
Immediate Anesthesia Transfer of Care Note  Patient: Pamela [name]  Procedure(s) Performed: Procedure(s): ORIF LEFT PERIPROSTHETIC FEMUR FRACTURE; CONVERSION TO TOTAL HIP ARTHROPLASTY POSS. FEMORAL COMPONENT REVISION  (Left)  Patient Location: PACU  Anesthesia Type:General  Level of Consciousness: awake and confused  Airway & Oxygen Therapy: Patient Spontanous Breathing and Patient connected to face mask oxygen  Post-op Assessment: Report given to RN, Post -op Vital signs reviewed and stable and Patient moving all extremities  Post vital signs: Reviewed and stable  Last Vitals:  Filed Vitals:   07/24/15 0449 07/24/15 2116  BP: 166/65 128/69  Pulse: 80 79  Temp: 36.7 C   Resp: 18 12    Complications: No apparent anesthesia complications.  Confusion is mild and might be pt beginning to hurt.  She makes eye contact but does not yet answer questions

## 2015-07-24 NOTE — Op Note (Signed)
NAMMarland Kitchen:  Pamela [name], Pamela [name]              ACCOUNT NO.:  [id number]  MEDICAL RECORD NO.:  [id number]  LOCATION:  [facility]                         FACILITY:  [facility]  PHYSICIAN:  Samson FredericBrian Enaya Howze, MD     DATE OF BIRTH:  Aug 16, 1920  DATE OF PROCEDURE:  07/24/2015 DATE OF DISCHARGE:                              OPERATIVE REPORT   SURGEON:  Samson FredericBrian Amiliana Foutz, M.D.  ASSISTANT:  None.  PREOPERATIVE DIAGNOSIS:  [place] B2 left periprosthetic femur fracture.  POSTOPERATIVE DIAGNOSIS:  [place] B2 left periprosthetic femur fracture.  PROCEDURE PERFORMED: 1. Revision femoral component, left hip hemi arthroplasty. 2. Conversion of previous surgery to total hip arthroplasty. 3. Open reduction and internal fixation of left femur fracture.  EXPLANTS: 1. DePuy [place] basic fracture stem size 3, with 54 mm head ball and     0 mm spacer.  IMPLANTS: 1. DePuy Pinnacle Gription 52 mm sector acetabular shell with 36 x 52     +4 neutral liner. 2. Cancellous bone screws, 6.5 mm x2. 3. DePuy reclaim distal tapered stem 17 x 190 mm straight, with 20 x     85 conical proximal body. 4. Biolox ceramic femoral head 36+ 5 mm. 5. Stryker AxSOS 3, 16 hole distal femur plate with 4.5 mm cortical     screws, 6.0 cancellous screws, and 5.0 mm locking screws. 6. Dall-Miles cables x4. 7. 20-gauge stainless steel cerclage wires.  ANESTHESIA:  General.  EBL:  1300 mL.  BLOOD PRODUCTS:  2 units packed red blood cells.  SPECIMENS:  None.  TUBES AND DRAINS:  Medium Hemovac x1.  COMPLICATIONS:  None.  DISPOSITION:  Stable to PACU.  INDICATIONS:  The patient is a very pleasant 81 year old female, who underwent left hip hemiarthroplasty by Dr. Charlann Boxerlin in June of 2015 for a displaced femoral neck fracture.  She has followed up with him regularly.  She has been having groin pain that responded to an intra- articular cortisone injection.  They were actually planning on converting her to a total hip arthroplasty in the  near future. She had a ground level fall at home yesterday while going to the bathroom and she sustained a left periprosthetic femur fracture.  She had pain and deformity to the thigh was unable to weight bear, brought to the hospital, where x-rays showed the above-mentioned fracture.  She was admitted to the hospitalist service, underwent perioperative risk stratification, medical optimization.  I discussed the risks, benefits, and alternatives to surgical intervention with her. We discussed conversion to total hip arthroplasty, possible revision of her femoral component, and open reduction and internal fixation of her fracture with a long locking plate.  She understood the risks, benefits, and alternatives and she elected to proceed with surgery as described above.  DESCRIPTION OF PROCEDURE:  I marked the patient in the holding area. She was taken to the operating room.  General anesthesia was induced on her bed.  She already had a Foley catheter in place.  [facility], flipped in the lateral decubitus position.  Axillary roll was placed.  She was secured to the bed with a hip positioner.  The left hip was prepped and draped in a normal  sterile surgical fashion.  Time-out was called verifying site and site of surgery.  She did receive 2 g of IV Ancef within 60 minutes of beginning the procedure.  I began by utilizing her previous incision, which I sharply excised with a #10 blade.  I carried her incision distally following the course of the femur towards Gerdy's tubercle.  I made full-thickness skin flaps.  I identified the IT band, which I split in line with fibers and then I made a standard posterior approach to the hip.  I took down the external rotators in the capsule in a single layer.  I reflected the vastus lateralis anteriorly. She had a long spiral  femur fracture. I gently dislocated her hip with a bone hook.  Her stem was grossly loose and I  removed it without any difficulty.  I subluxated her femur anteriorly.  I placed acetabular retractors.  She did have worn bone within the acetabular socket.  I reamed sequentially up to a 51 reamer.  I placed the 52 cup at approximately 40 degrees of abduction, 20 degrees of anteversion.  I achieved excellent stability and I chose to augment this with two 6.5 cancellous screws.  I then placed a real 36+ 4 neutral liner.  I then turned my attention back to the femur. Using traction and rotation, the fracture was reduced and clamped.  She did have a long spiral fracture.  The fracture actually extended up into the calcar.  I placed a single 20-gauge double stainless wire around the femoral neck and proximal femur.  I then placed 4 sets of wires distally to provisionally hold the fracture.  I confirmed the reduction with AP and lateral fluoroscopy views.  I then prepared her for the reclaim stem.  I placed a reamer for a 190 mm body. I checked this on x-ray and I had bypassed the fracture by 2 cortical diameters.  I deemed this to be the correct length of her stem. I then sequentially reamed up to a 17 with excellent chatter.  I placed a real 17 distal body and then I reamed proximally for a 20 x 85 body. I placed the trial body, reduced the hip with a trial head.  Excellent stability, soft-tissue tension great.  I dislocated the hip very carefully.  I placed the real proximal body, the real ceramic head and the hip was reduced.  Again, she had excellent stability.  There was no dislocation in full extension and external rotation.  With 90 degrees of flexion and neutral abduction, I was able to rotator her about 80 degrees before there was any impingement.  This was excellent.  I then turned my attention to fixation of her fracture and protecting the entire bone.  I selected a 16 hole distal femoral locking plate.  The plate was provisionally held with K-wires.  I confirmed the  position with AP and lateral fluoroscopy views.  I placed 3 compression screws to hold the plate down to the bone. Proximally, I placed cerclage cables 4 in total to secure the plate to the bone.  I then placed an additional locking screw distal to the tip of the stem and 2 locking screws in the metaphyseal region of the femur.  I then crimped all the cables, and I clipped the cables. I checked AP and lateral fluoroscopy views, the fracture was nearly anatomically reduced.  Hardware positioning was appropriate.  I copiously irrigated the wound with a dilute Betadine solution followed by saline with pulse  lavage.  I closed the hip capsule and rotators with #1 Vicryl.  I closed the IT band over a medium Hemovac drain with a combination of #1 Vicryl and 0 V-lock suture.  The deep dermal layer was closed with 2-0 Monocryl and then I closed the skin with staples.  I applied Dermabond glue to the skin. Once the glue was fully dried, Aquacel Ag dressings were applied.  The patient was then transferred to her bed, extubated, taken to the PACU in stable condition.  Sponge, needle, and instrument counts were correct at the end of the case x2.  There were no known complications.  Anesthesia recommended sending her to the ICU overnight for close observation.  I discussed the operative events and findings with the patient.  She will be transferred to the ICU for overnight observation.  She will be touchdown weightbearing to the left lower extremity.  We will place her on Lovenox for DVT prophylaxis.  She will receive physical therapy when appropriate.  She will need disposition planning.  I will see her in the office 2 weeks after discharge.  All questions solicited and answered.          ______________________________ Samson Frederic, MD     BS/MEDQ  D:  07/24/2015  T:  07/24/2015  Job:  [id number]

## 2015-07-24 NOTE — Anesthesia Preprocedure Evaluation (Signed)
Anesthesia Evaluation  Patient identified by MRN, date of birth, ID band Patient awake    Reviewed: Allergy & Precautions, NPO status , Patient's Chart, lab work & pertinent test results  Airway Mallampati: I  TM Distance: >3 FB Neck ROM: Full    Dental  (+) Teeth Intact, Dental Advisory Given   Pulmonary    breath sounds clear to auscultation       Cardiovascular hypertension, Pt. on home beta blockers and Pt. on medications + CAD  + pacemaker  Rhythm:Regular Rate:Normal     Neuro/Psych    GI/Hepatic   Endo/Other    Renal/GU      Musculoskeletal   Abdominal   Peds  Hematology   Anesthesia Other Findings   Reproductive/Obstetrics                             Anesthesia Physical Anesthesia Plan  ASA: III  Anesthesia Plan: General   Post-op Pain Management:    Induction: Intravenous  Airway Management Planned: Oral ETT  Additional Equipment:   Intra-op Plan:   Post-operative Plan: Extubation in OR  Informed Consent: I have reviewed the patients History and Physical, chart, labs and discussed the procedure including the risks, benefits and alternatives for the proposed anesthesia with the patient or authorized representative who has indicated his/her understanding and acceptance.   Dental advisory given  Plan Discussed with: CRNA and Anesthesiologist  Anesthesia Plan Comments:         Anesthesia Quick Evaluation

## 2015-07-24 NOTE — H&P (Signed)
[facility] History and Physical  Pamela [name]:[id number] DOB: [birth year] DOA: 07/23/2015  Referring physician: transfer from [facility] PCP: Trinna Post, MD   Chief Complaint: left leg pain  HPI:  Ms. Pamela [name] is a 81 year old female with a past medical history significant for tachybradycardia syndrome status post pacemaker, previous left hip replacement, hypertension, hyperlipidemia, coronary artery disease; who presents after sustaining a fall at home. She notes that she had been on her way to bed and had turned around to go to the bathroom and somehow lost balance and fell backwards. She really felt pain in her left leg. She rates pain as a 8 out of 10 on the pain scale. Pain worsened by any movement. She was taken to [facility] where x-rays revealed an oblique fracture extending across the left mid femur, arising at the distal aspect of the femoral stem of the patient's left hip hemiarthroplasty. The ED physician contacted orthopedics who accepted transfer if admitted to the hospitalist service.   Review of Systems  Constitutional: Negative for weight loss and malaise/fatigue.  HENT: Negative for tinnitus.   Eyes: Negative for photophobia and pain.  Respiratory: Negative for hemoptysis and sputum production.   Cardiovascular: Positive for leg swelling. Negative for chest pain and palpitations.  Gastrointestinal: Negative for nausea, vomiting and abdominal pain.  Genitourinary: Negative for urgency and frequency.  Musculoskeletal: Positive for joint pain and falls.  Skin: Negative for itching and rash.  Neurological: Negative for focal weakness, seizures, loss of consciousness and headaches.  Endo/Heme/Allergies: Negative for environmental allergies and polydipsia.  Psychiatric/Behavioral: Negative for hallucinations and substance abuse.      Past Medical History  Diagnosis Date  . Pacemaker 08/29/2012    st jude accent DR RF device, model  number [id number], serial number [id number], implanted 08/29/2012 for sinus bradycardia, runs of supraventricular tachycardia  last checked 10/23/2012  . ICD (implantable cardiac defibrillator) in place   . SSS (sick sinus syndrome) with PAT and marked bradycardia with 3 sec pauses  08/30/2012  . Status post placement of cardiac pacemaker. 08/29/12 St. Jude device 08/30/2012  . HTN (hypertension) 08/30/2012  . Hyperlipidemia 08/30/2012  . Polyneuropathy in other diseases classified elsewhere   . Macular degeneration   . Coronary artery disease     stent in the LAD artery in 2002  . Venous insufficiency      Past Surgical History  Procedure Laterality Date  . Cardiac catheterization  2006    3 stents placed  . Insert / replace / remove pacemaker  08/29/2012    st jude   . Rotator cuff repair Left   . Abdominal hysterectomy    . Cesarean section    . Kidney stone surgery    . Rotator cuff repair Right   . Cardiac catheterization  06/2001    placement of BiodivYsio 2.5.10mm stent dilated to 2.59mm in proximal left anterior descenting stenotic lesion  . Radiofrequency ablation  08/25/2010    ablation of left greater saphenous vein  . Hip arthroplasty Left 01/04/2014    Procedure: ARTHROPLASTY BIPOLAR HIP;  Surgeon: Shelda Pal, MD;  Location: [facility] OR;  Service: Orthopedics;  Laterality: Left;  . Permanent pacemaker insertion N/A 08/29/2012    Procedure: PERMANENT PACEMAKER INSERTION;  Surgeon: Thurmon Fair, MD;  Location: [facility] CATH LAB;  Service: Cardiovascular;  Laterality: N/A;      Social History:  reports that she has never smoked. She has never used smokeless tobacco. She reports that she does  not drink alcohol or use illicit drugs. Where does patient live--home and with whom if at home?alone   No Known Allergies  Family History  Problem Relation Age of Onset  . Heart disease Mother   . Congestive Heart Failure Mother   . Heart disease Father   . Heart attack Father   . Heart  disease Brother        Prior to Admission medications   Medication Sig Start Date End Date Taking? Authorizing Provider  acetaminophen (TYLENOL) 500 MG tablet Take 500 mg by mouth every 6 (six) hours as needed for mild pain or moderate pain.    Yes Historical Provider, MD  aspirin EC 81 MG tablet Take 81 mg by mouth every morning.    Yes Historical Provider, MD  atorvastatin (LIPITOR) 10 MG tablet TAKE ONE (1) TABLET BY MOUTH EVERY DAY 08/28/14  Yes Marinus Maw, MD  carvedilol (COREG) 3.125 MG tablet Take 1 tablet (3.125 mg total) by mouth 2 (two) times daily. Patient taking differently: Take 3.125 mg by mouth every morning.  04/30/15  Yes Marinus Maw, MD  Cholecalciferol (VITAMIN D-3 PO) Take 1 capsule by mouth daily.   Yes Historical Provider, MD  ezetimibe (ZETIA) 10 MG tablet Take 10 mg by mouth daily.   Yes Historical Provider, MD  ferrous sulfate 325 (65 FE) MG tablet Take 325 mg by mouth 2 (two) times a week.    Yes Historical Provider, MD  furosemide (LASIX) 20 MG tablet TAKE ONE TABLET BY MOUTH DAILY AS NEEDEDFOR EDEMA 01/22/15  Yes Marinus Maw, MD  gabapentin (NEURONTIN) 100 MG capsule Take 100 mg by mouth at bedtime.   Yes Historical Provider, MD  Ibuprofen-Diphenhydramine HCl (ADVIL PM) 200-25 MG CAPS Take 1 capsule by mouth at bedtime.   Yes Historical Provider, MD  l-methylfolate-B6-B12 [facility]) 3-35-2 MG TABS TAKE ONE TABLET TWICE DAILY 01/21/15  Yes York Spaniel, MD  Multiple Vitamins-Minerals (OCUVITE PO) Take 1 tablet by mouth 2 (two) times daily.   Yes Historical Provider, MD  nitroGLYCERIN (NITROSTAT) 0.4 MG SL tablet Place 1 tablet (0.4 mg total) under the tongue every 5 (five) minutes as needed for chest pain. 06/01/15  Yes Marinus Maw, MD  Polyethyl Glycol-Propyl Glycol (SYSTANE OP) Apply 1 drop to eye daily as needed (Dry Eyes).    Yes Historical Provider, MD  ramipril (ALTACE) 2.5 MG capsule Take 1 capsule (2.5 mg total) by mouth daily. 09/30/14  Yes Marinus Maw, MD  vitamin B-12 (CYANOCOBALAMIN) 1000 MCG tablet Take 1,000 mcg by mouth daily.   Yes Historical Provider, MD     Physical Exam: Filed Vitals:   07/24/15 0300 07/24/15 0316 07/24/15 0330 07/24/15 0449  BP: 136/72  146/69 166/65  Pulse: 88  87 80  Temp:    98 F (36.7 C)  TempSrc:    Oral  Resp:  20 16 18   Height:      Weight:      SpO2: 97%  95% 98%     Constitutional: Vital signs reviewed. Patient is a well-developed and well-nourished in no acute distress and cooperative with exam. Alert and oriented x3.  Head: Normocephalic and atraumatic  Ear: TM normal bilaterally  Mouth: no erythema or exudates, MMM  Eyes: PERRL, EOMI, conjunctivae normal, No scleral icterus.  Neck: Supple, Trachea midline normal ROM, No JVD, mass, thyromegaly, or carotid bruit present.  Cardiovascular: RRR, S1 normal, S2 normal, no MRG, pulses symmetric and intact bilaterally  Pulmonary/Chest:  CTAB, no wheezes, rales, or rhonchi  Abdominal: Soft. Non-tender, non-distended, bowel sounds are normal, no masses, organomegaly, or guarding present.  GU: no CVA tenderness Musculoskeletal: No joint deformities, erythema, or stiffness, ROM full and no nontender Ext: left leg externally rotated in splint  Hematology: no cervical, inginal, or axillary adenopathy.  Neurological: A&O x3, Strenght is normal and symmetric bilaterally, cranial nerve II-XII are grossly intact, no focal motor deficit, sensory intact to light touch bilaterally.  Skin: Warm, dry and intact. No rash, cyanosis, or clubbing.  Psychiatric: Normal mood and affect. speech and behavior is normal. Judgment and thought content normal. Cognition and memory are normal.      Data Review   Micro Results No results found for this or any previous visit (from the past 240 hour(s)).  Radiology Reports Dg Chest 1 View  07/24/2015  CLINICAL DATA:  81 year old female with fall and left femur pain EXAM: CHEST 1 VIEW COMPARISON:  Chest  radiograph dated 01/03/2014 FINDINGS: Single-view of the chest does not demonstrate a focal consolidation. There is no pleural effusion or pneumothorax. Stable cardiac silhouette. Left pectoral pacemaker device. Lower thoracic vertebroplasty changes noted. IMPRESSION: No active disease. Electronically Signed   By: Elgie Collard M.D.   On: 07/24/2015 00:14   Dg Femur Min 2 Views Left  07/24/2015  CLINICAL DATA:  Status post fall backwards at home, landing on left side, with left femur pain. Initial encounter. EXAM: LEFT FEMUR 2 VIEWS COMPARISON:  None. FINDINGS: There is an oblique fracture extending across the left mid femur, arising at the distal aspect of the femoral stem of the patient's left hip hemiarthroplasty. There is up to 1 cm of anterior and distal displacement, without significant angulation. No additional fractures are seen. Diffuse sclerotic change is noted at the left knee. The soft tissues are difficult to fully assess on radiograph. IMPRESSION: Oblique fracture extending across the left mid femur, arising at the distal aspect of the femoral stem of the patient's left hip hemiarthroplasty. Up to 1 cm of anterior and distal displacement noted, without significant angulation. Electronically Signed   By: Roanna Raider M.D.   On: 07/24/2015 00:16     CBC  Recent Labs Lab 07/24/15 0105  WBC 5.9  HGB 11.5*  HCT 36.4  PLT 143*  MCV 95.8  MCH 30.3  MCHC 31.6  RDW 13.7  LYMPHSABS 1.0  MONOABS 0.4  EOSABS 0.1  BASOSABS 0.0    Chemistries   Recent Labs Lab 07/24/15 0105  NA 144  K 3.9  CL 107  CO2 29  GLUCOSE 123*  BUN 29*  CREATININE 0.87  CALCIUM 9.4  AST 32  ALT 22  ALKPHOS 42  BILITOT 0.6   ------------------------------------------------------------------------------------------------------------------ estimated creatinine clearance is 40.3 mL/min (by C-G formula based on Cr of  0.87). ------------------------------------------------------------------------------------------------------------------ No results for input(s): HGBA1C in the last 72 hours. ------------------------------------------------------------------------------------------------------------------ No results for input(s): CHOL, HDL, LDLCALC, TRIG, CHOLHDL, LDLDIRECT in the last 72 hours. ------------------------------------------------------------------------------------------------------------------ No results for input(s): TSH, T4TOTAL, T3FREE, THYROIDAB in the last 72 hours.  Invalid input(s): FREET3 ------------------------------------------------------------------------------------------------------------------ No results for input(s): VITAMINB12, FOLATE, FERRITIN, TIBC, IRON, RETICCTPCT in the last 72 hours.  Coagulation profile  Recent Labs Lab 07/24/15 0105  INR 1.06    No results for input(s): DDIMER in the last 72 hours.  Cardiac Enzymes No results for input(s): CKMB, TROPONINI, MYOGLOBIN in the last 168 hours.  Invalid input(s): CK ------------------------------------------------------------------------------------------------------------------ Invalid input(s): POCBNP   CBG: No results for input(s): GLUCAP in  the last 168 hours.     EKG: Independently reviewed. Showing sinus rhythm with paced rhythm   Assessment: Principal Problem:   Femur fracture, left (HCC) Active Problems:   Status post placement of cardiac pacemaker. 08/29/12 St. Jude device   HTN (hypertension)   Hyperlipidemia   Thrombocytopenia, unspecified (HCC)   Anemia, iron deficiency   Chronic kidney disease  Plan:  [facility] 2. Orthopedics aware patient to see in a.m. 3. Patient to be nothing by mouth except for meds 4. Continue  Morphine for pain control 5. Active problems appear to be stable close to baseline 6. Social work consult  Code Status:    full Family Communication: bedside Disposition Plan: admit   Total time spent 55 minutes.Greater than 50% of this time was spent in counseling, explanation of diagnosis, planning of further management, and coordination of care  Clydie BraunRondell A Jannat Rosemeyer [facility] Pager [phone]  If 7PM-7AM, please contact night-coverage [email or web address] Password [email or web address] 07/24/2015, 5:56 AM

## 2015-07-24 NOTE — H&P (View-Only) (Signed)
Reason for Consult:peri prosthetic left femur fracture Referring Physician: Roselle [name] is an 81 y.o. female.  HPI: 81 yo female who presents in transfer from [name] after a ground level fall with a left hip injury.  Patient unable to stand or ambulate after the injury. She complains of severe left thigh pain. XRAYs at [name] demonstrate a spiral peri prosthetic left femur fracture with an apparently stable stem. Patient sees Dr Pamela [name] in our practice.  Past Medical History  Diagnosis Date  . Pacemaker 08/29/2012    st jude accent DR RF device, model number [id number], serial number [id number], implanted 08/29/2012 for sinus bradycardia, runs of supraventricular tachycardia  last checked 10/23/2012  . ICD (implantable cardiac defibrillator) in place   . SSS (sick sinus syndrome) with PAT and marked bradycardia with 3 sec pauses  08/30/2012  . Status post placement of cardiac pacemaker. 08/29/12 St. Jude device 08/30/2012  . HTN (hypertension) 08/30/2012  . Hyperlipidemia 08/30/2012  . Polyneuropathy in other diseases classified elsewhere   . Macular degeneration   . Coronary artery disease     stent in the LAD artery in 2002  . Venous insufficiency     Past Surgical History  Procedure Laterality Date  . Cardiac catheterization  2006    3 stents placed  . Insert / replace / remove pacemaker  08/29/2012    st jude   . Rotator cuff repair Left   . Abdominal hysterectomy    . Cesarean section    . Kidney stone surgery    . Rotator cuff repair Right   . Cardiac catheterization  06/2001    placement of BiodivYsio 2.5.10mm stent dilated to 2.3m in proximal left anterior descenting stenotic lesion  . Radiofrequency ablation  08/25/2010    ablation of left greater saphenous vein  . Hip arthroplasty Left 01/04/2014    Procedure: ARTHROPLASTY BIPOLAR HIP;  Surgeon: Pamela Pole MD;  Location: [name]  Service: Orthopedics;  Laterality: Left;  . Permanent pacemaker insertion N/A 08/29/2012     Procedure: PERMANENT PACEMAKER INSERTION;  Surgeon: MSanda Klein MD;  [name];  Service: Cardiovascular;  Laterality: N/A;    Family History  Problem Relation Age of Onset  . Heart disease Mother   . Congestive Heart Failure Mother   . Heart disease Father   . Heart attack Father   . Heart disease Brother     Social History:  reports that she has never smoked. She has never used smokeless tobacco. She reports that she does not drink alcohol or use illicit drugs.  Allergies: No Known Allergies  Medications: I have reviewed the patient's current medications.  Results for orders placed or performed during the [name] encounter of 07/23/15 (from the past 48 hour(s))  Comprehensive metabolic panel     Status: Abnormal   Collection Time: 07/24/15  1:05 AM  Result Value Ref Range   Sodium 144 135 - 145 mmol/L   Potassium 3.9 3.5 - 5.1 mmol/L   Chloride 107 101 - 111 mmol/L   CO2 29 22 - 32 mmol/L   Glucose, Bld 123 (H) 65 - 99 mg/dL   BUN 29 (H) 6 - 20 mg/dL   Creatinine, Ser 0.87 0.44 - 1.00 mg/dL   Calcium 9.4 8.9 - 10.3 mg/dL   Total Protein 6.3 (L) 6.5 - 8.1 g/dL   Albumin 4.2 3.5 - 5.0 g/dL   AST 32 15 - 41 U/L   ALT 22 14 -  54 U/L   Alkaline Phosphatase 42 38 - 126 U/L   Total Bilirubin 0.6 0.3 - 1.2 mg/dL   GFR calc non Af Amer 56 (L) >60 mL/min   GFR calc Af Amer >60 >60 mL/min    Comment: (NOTE) The eGFR has been calculated using the CKD EPI equation. This calculation has not been validated in all clinical situations. eGFR's persistently <60 mL/min signify possible Chronic Kidney Disease.    Anion gap 8 5 - 15  CBC with Differential     Status: Abnormal   Collection Time: 07/24/15  1:05 AM  Result Value Ref Range   WBC 5.9 4.0 - 10.5 K/uL   RBC 3.80 (L) 3.87 - 5.11 MIL/uL   Hemoglobin 11.5 (L) 12.0 - 15.0 g/dL   HCT 36.4 36.0 - 46.0 %   MCV 95.8 78.0 - 100.0 fL   MCH 30.3 26.0 - 34.0 pg   MCHC 31.6 30.0 - 36.0 g/dL   RDW 13.7 11.5 - 15.5  %   Platelets 143 (L) 150 - 400 K/uL   Neutrophils Relative % 75 %   Neutro Abs 4.4 1.7 - 7.7 K/uL   Lymphocytes Relative 16 %   Lymphs Abs 1.0 0.7 - 4.0 K/uL   Monocytes Relative 7 %   Monocytes Absolute 0.4 0.1 - 1.0 K/uL   Eosinophils Relative 2 %   Eosinophils Absolute 0.1 0.0 - 0.7 K/uL   Basophils Relative 0 %   Basophils Absolute 0.0 0.0 - 0.1 K/uL  Protime-INR     Status: None   Collection Time: 07/24/15  1:05 AM  Result Value Ref Range   Prothrombin Time 14.0 11.6 - 15.2 seconds   INR 1.06 0.00 - 1.49  APTT     Status: None   Collection Time: 07/24/15  1:05 AM  Result Value Ref Range   aPTT 27 24 - 37 seconds  Urinalysis, Routine w reflex microscopic (not at [name])     Status: None   Collection Time: 07/24/15  1:30 AM  Result Value Ref Range   Color, Urine YELLOW YELLOW   APPearance CLEAR CLEAR   Specific Gravity, Urine 1.015 1.005 - 1.030   pH 7.5 5.0 - 8.0   Glucose, UA NEGATIVE NEGATIVE mg/dL   Hgb urine dipstick NEGATIVE NEGATIVE   Bilirubin Urine NEGATIVE NEGATIVE   Ketones, ur NEGATIVE NEGATIVE mg/dL   Protein, ur NEGATIVE NEGATIVE mg/dL   Nitrite NEGATIVE NEGATIVE   Leukocytes, UA NEGATIVE NEGATIVE    Comment: MICROSCOPIC NOT DONE ON URINES WITH NEGATIVE PROTEIN, BLOOD, LEUKOCYTES, NITRITE, OR GLUCOSE <1000 mg/dL.    Dg Chest 1 View  07/24/2015  CLINICAL DATA:  81 year old female with fall and left femur pain EXAM: CHEST 1 VIEW COMPARISON:  Chest radiograph dated 01/03/2014 FINDINGS: Single-view of the chest does not demonstrate a focal consolidation. There is no pleural effusion or pneumothorax. Stable cardiac silhouette. Left pectoral pacemaker device. Lower thoracic vertebroplasty changes noted. IMPRESSION: No active disease. Electronically Signed   By: Pamela [name] M.D.   On: 07/24/2015 00:14   Dg Femur Min 2 Views Left  07/24/2015  CLINICAL DATA:  Status post fall backwards at home, landing on left side, with left femur pain. Initial encounter.  EXAM: LEFT FEMUR 2 VIEWS COMPARISON:  None. FINDINGS: There is an oblique fracture extending across the left mid femur, arising at the distal aspect of the femoral stem of the patient's left hip hemiarthroplasty. There is up to 1 cm of anterior and distal displacement, without  significant angulation. No additional fractures are seen. Diffuse sclerotic change is noted at the left knee. The soft tissues are difficult to fully assess on radiograph. IMPRESSION: Oblique fracture extending across the left mid femur, arising at the distal aspect of the femoral stem of the patient's left hip hemiarthroplasty. Up to 1 cm of anterior and distal displacement noted, without significant angulation. Electronically Signed   By: Garald Balding M.D.   On: 07/24/2015 00:16    ROS Blood pressure 166/65, pulse 80, temperature 98 F (36.7 C), temperature source Oral, resp. rate 18, height _0  (1.651 m), weight 68.947 kg (152 lb), SpO2 98 %. Physical Exam AAO moderate distress. Neck non-tender with no pain with AROM, bilateral UEs with pain free AROM and 5/5 motor strength.  Chest non tender with normal excursion. Abdomen soft. Right LE with no pain with full AROM, left LE is shortened but immobilized in a knee immobilizer. She is NVI in the left LE, extremity is well perfused and alignment is reasonable.  Assessment/Plan: Left peri-prosthetic femur fracture.  Will require ORIF.  I have reached out to Dr Pamela [name] for his thoughts and have consulted Dr Rod Can, a hip arthroplasty specialist for his assistance. Maintain NPO, plan to follow.  Pamela [name],STEVEN R 07/24/2015, 8:00 AM

## 2015-07-24 NOTE — Progress Notes (Signed)
Orthopedic Tech Progress Note Patient Details:  Pamela [name] Aug 14, 1920 [id number]  Patient ID: Pamela [name], female   DOB: Aug 14, 1920, 81 y.o.   MRN: [id number] Order for long leg splint cancelled by Dr. Verlan FriendsSteven Norris  Holger Sokolowski 07/24/2015, 8:43 AM

## 2015-07-24 NOTE — Plan of Care (Addendum)
[facility] PLAN OF CARE NOTE  Patient: Pamela [name] N Garton   QMV:[id number]  PCP: Trinna PostKOBERLEIN, JUNELL CAROL, MD DOB: [birth year]  DOA: 07/23/2015    DOS: 07/24/2015   Patient was admitted by my colleague Dr. Katrinka BlazingSmith earlier on 07/24/2015. I have reviewed the H&P as well as assessment and plan and agree with the same, other than Important changes which are listed below.  Plan of care: Patient to remain nothing by mouth, orthopedic evaluating for surgical option. Patient appears to be moderate risk for any poor cardiac vascular outcome due to her age. Patient had tolerated hemiarthroplasty on the same hip in May 2016.  Author: Lynden OxfordPranav Patel, MD [facility] Pager: [phone] 07/24/2015 1:32 PM   If 7PM-7AM, please contact night-coverage at [email or web address], password [email or web address]

## 2015-07-24 NOTE — Anesthesia Postprocedure Evaluation (Signed)
Anesthesia Post Note  Patient: Pamela [name]  Procedure(s) Performed: Procedure(s) (LRB): ORIF LEFT PERIPROSTHETIC FEMUR FRACTURE; CONVERSION TO TOTAL HIP ARTHROPLASTY POSS. FEMORAL COMPONENT REVISION  (Left)  Patient location during evaluation: PACU Anesthesia Type: General Level of consciousness: awake and alert Pain management: pain level controlled Vital Signs Assessment: post-procedure vital signs reviewed and stable Respiratory status: spontaneous breathing, nonlabored ventilation, respiratory function stable and patient connected to face mask oxygen Cardiovascular status: blood pressure returned to baseline and stable Postop Assessment: no signs of nausea or vomiting Anesthetic complications: no    Last Vitals:  Filed Vitals:   07/24/15 2116 07/24/15 2130  BP: 128/69   Pulse: 79 82  Temp:  36.5 C  Resp: 12 18    Last Pain:  Filed Vitals:   07/24/15 2143  PainSc: 6                  Shivaay Stormont A

## 2015-07-24 NOTE — ED Notes (Signed)
Placed patient on a bedpan with help from Joseph ArtAlfred Dillard

## 2015-07-24 NOTE — Anesthesia Procedure Notes (Signed)
Procedure Name: Intubation Date/Time: 07/24/2015 2:12 PM Performed by: Reine JustFLOWERS, Malic Rosten T Pre-anesthesia Checklist: Patient identified, Emergency Drugs available, Suction available, Patient being monitored and Timeout performed Patient Re-evaluated:Patient Re-evaluated prior to inductionOxygen Delivery Method: Circle system utilized and Simple face mask Preoxygenation: Pre-oxygenation with 100% oxygen Intubation Type: Rapid sequence Ventilation: Mask ventilation without difficulty Laryngoscope Size: Miller and 2 Grade View: Grade II Tube type: Oral Tube size: 7.5 mm Number of attempts: 1 Airway Equipment and Method: Stylet and Patient positioned with wedge pillow Placement Confirmation: ETT inserted through vocal cords under direct vision,  positive ETCO2 and breath sounds checked- equal and bilateral Secured at: 22 cm Tube secured with: Tape Dental Injury: Teeth and Oropharynx as per pre-operative assessment

## 2015-07-24 NOTE — Discharge Instructions (Signed)
Dr. Samson FredericBrian Vala Raffo Joint Replacement Specialist [facility] [address] [place], [place] [address] [phone]   TOTAL HIP REPLACEMENT POSTOPERATIVE DIRECTIONS    Hip Rehabilitation, Guidelines Following Surgery   WEIGHT BEARING Other:  Touch Down Weight Bearing (30%) Left leg with walker  The results of a hip operation are greatly improved after range of motion and muscle strengthening exercises. Follow all safety measures which are given to protect your hip. If any of these exercises cause increased pain or swelling in your joint, decrease the amount until you are comfortable again. Then slowly increase the exercises. Call your caregiver if you have problems or questions.   HOME CARE INSTRUCTIONS  Most of the following instructions are designed to prevent the dislocation of your new hip.  Remove items at home which could result in a fall. This includes throw rugs or furniture in walking pathways.  Continue medications as instructed at time of discharge.  You may have some home medications which will be placed on hold until you complete the course of blood thinner medication.  You may start showering once you are discharged home. Do not remove your dressing. Do not put on socks or shoes without following the instructions of your caregivers.   Sit on chairs with arms. Use the chair arms to help push yourself up when arising.  Arrange for the use of a toilet seat elevator so you are not sitting low.   Walk with walker as instructed.  You may resume a sexual relationship in one month or when given the OK by your caregiver.  Use walker as long as suggested by your caregivers.  You may put full weight on your legs and walk as much as is comfortable. Avoid periods of inactivity such as sitting longer than an hour when not asleep. This helps prevent blood clots.  You may return to work once you are cleared by Designer, industrial/productyour surgeon.  Do not drive a car for 6 weeks or  until released by your surgeon.  Do not drive while taking narcotics.  Wear elastic stockings for two weeks following surgery during the day but you may remove then at night.  Make sure you keep all of your appointments after your operation with all of your doctors and caregivers. You should call the office at the above phone number and make an appointment for approximately two weeks after the date of your surgery. Please pick up a stool softener and laxative for home use as long as you are requiring pain medications.  ICE to the affected hip every three hours for 30 minutes at a time and then as needed for pain and swelling. Continue to use ice on the hip for pain and swelling from surgery. You may notice swelling that will progress down to the foot and ankle.  This is normal after surgery.  Elevate the leg when you are not up walking on it.   It is important for you to complete the blood thinner medication as prescribed by your doctor.  Continue to use the breathing machine which will help keep your temperature down.  It is common for your temperature to cycle up and down following surgery, especially at night when you are not up moving around and exerting yourself.  The breathing machine keeps your lungs expanded and your temperature down.  RANGE OF MOTION AND STRENGTHENING EXERCISES  These exercises are designed to help you keep full movement of your hip joint. Follow your caregiver's or physical therapist's  instructions. Perform all exercises about fifteen times, three times per day or as directed. Exercise both hips, even if you have had only one joint replacement. These exercises can be done on a training (exercise) mat, on the floor, on a table or on a bed. Use whatever works the best and is most comfortable for you. Use music or television while you are exercising so that the exercises are a pleasant break in your day. This will make your life better with the exercises acting as a break in  routine you can look forward to.  Lying on your back, slowly slide your foot toward your buttocks, raising your knee up off the floor. Then slowly slide your foot back down until your leg is straight again.  Lying on your back spread your legs as far apart as you can without causing discomfort.  Lying on your side, raise your upper leg and foot straight up from the floor as far as is comfortable. Slowly lower the leg and repeat.  Lying on your back, tighten up the muscle in the front of your thigh (quadriceps muscles). You can do this by keeping your leg straight and trying to raise your heel off the floor. This helps strengthen the largest muscle supporting your knee.  Lying on your back, tighten up the muscles of your buttocks both with the legs straight and with the knee bent at a comfortable angle while keeping your heel on the floor.   SKILLED REHAB INSTRUCTIONS: If the patient is transferred to a skilled rehab facility following release from the hospital, a list of the current medications will be sent to the facility for the patient to continue.  When discharged from the skilled rehab facility, please have the facility set up the patient's Home Health Physical Therapy prior to being released. Also, the skilled facility will be responsible for providing the patient with their medications at time of release from the facility to include their pain medication and their blood thinner medication. If the patient is still at the rehab facility at time of the two week follow up appointment, the skilled rehab facility will also need to assist the patient in arranging follow up appointment in our office and any transportation needs.  MAKE SURE YOU:  Understand these instructions.  Will watch your condition.  Will get help right away if you are not doing well or get worse.  Pick up stool softner and laxative for home use following surgery while on pain medications. Do not remove your dressing. The  dressing is waterproof--it is OK to take showers. Continue to use ice for pain and swelling after surgery. Do not use any lotions or creams on the incision until instructed by your surgeon. POSTERIOR HIP PRECAUTIONS. Total Hip Protocol.

## 2015-07-24 NOTE — Progress Notes (Signed)
PT Cancellation Note  Patient Details Name: Rosanne Sackreva N Mahrt MRN: [id number] DOB: 02-27-1921   Cancelled Treatment:    Reason Eval/Treat Not Completed: Medical issues which prohibited therapy.  Awaiting ortho plan.  Possible OR?  Spoke with Dr. Ranell PatrickNorris who is formulating a plan with the other surgeons.  PT will follow and await clearance, likely post-op.    Thanks,  Rollene Rotundaebecca B. Issaiah Seabrooks, PT, DPT [phone]   07/24/2015, 8:41 AM

## 2015-07-24 NOTE — Interval H&P Note (Signed)
History and Physical Interval Note:  07/24/2015 1:37 PM  [name]  has presented today for surgery, with the diagnosis of left periprosthetic femur fx  The various methods of treatment have been discussed with the patient and family. After consideration of risks, benefits and other options for treatment, the patient has consented to  Procedure(s): ORIF LEFT PERIPROSTHETIC FEMUR FRACTURE; CONVERSION TO TOTAL HIP ARTHROPLASTY POSS. FEMORAL COMPONENT REVISION  (Left) as a surgical intervention .  The patient's history has been reviewed, patient examined, no change in status, stable for surgery.  I have reviewed the patient's chart and labs.  Questions were answered to the patient's satisfaction.     Asked to assume care by Dr. Ranell PatrickNorris. L hip hemiarthroplasty by Dr. Charlann Boxerlin 18 mos ago for fx. Has WB groin pain which responded to injections. Dr. Charlann Boxerlin was planning for conversion to THA in the next couple of months. Fell yesterday and has ppx femur fx. Plan for ORIF L ppx femur fracture, conversion to THA, possible femoral component revision.   Pamela [name], Pamela [name]

## 2015-07-24 NOTE — Progress Notes (Signed)
Surgeon added left femur to consent. Patient initialed changes as well as myself. Patient initials are difficult to make out but patient did confirm left side.

## 2015-07-24 NOTE — Brief Op Note (Signed)
07/23/2015 - 07/24/2015  9:07 PM  PATIENT:  Pamela [name]  81 y.o. female  PRE-OPERATIVE DIAGNOSIS:  left periprosthetic femur fx  POST-OPERATIVE DIAGNOSIS:  left periprosthetic femur fx  PROCEDURE:  Procedure(s): ORIF LEFT PERIPROSTHETIC FEMUR FRACTURE; CONVERSION TO TOTAL HIP ARTHROPLASTY POSS. FEMORAL COMPONENT REVISION  (Left)  SURGEON:  Surgeon(s) and Role:    * Samson FredericBrian Medea Deines, MD - Primary  PHYSICIAN ASSISTANT: none  ASSISTANTS: none   ANESTHESIA:   general  EBL:  Total I/O In: 1000 [I.V.:1000] Out: - 1300 mL  BLOOD ADMINISTERED: 2 units PRBCs  DRAINS: (1) Hemovact drain(s) in the left hip with  Suction Open   LOCAL MEDICATIONS USED:  NONE  SPECIMEN:  No Specimen  DISPOSITION OF SPECIMEN:  N/A  COUNTS:  YES  TOURNIQUET:  * No tourniquets in log *  DICTATION: .Other Dictation: Dictation Number [id number]  PLAN OF CARE: Admit to inpatient   PATIENT DISPOSITION:  ICU - extubated and stable.   Delay start of Pharmacological VTE agent (>24hrs) due to surgical blood loss or risk of bleeding: no

## 2015-07-24 NOTE — Addendum Note (Signed)
Addendum  created 07/24/15 2207 by Sheldon Silvanavid Taylen Wendland, MD   Modules edited: Orders, PRL Based Order Sets

## 2015-07-25 DIAGNOSIS — N183 Chronic kidney disease, stage 3 (moderate): Secondary | ICD-10-CM

## 2015-07-25 LAB — CBC
HCT: 32.8 % — ABNORMAL LOW (ref 36.0–46.0)
Hemoglobin: 11.1 g/dL — ABNORMAL LOW (ref 12.0–15.0)
MCH: 30 pg (ref 26.0–34.0)
MCHC: 33.8 g/dL (ref 30.0–36.0)
MCV: 88.6 fL (ref 78.0–100.0)
PLATELETS: 71 10*3/uL — AB (ref 150–400)
RBC: 3.7 MIL/uL — ABNORMAL LOW (ref 3.87–5.11)
RDW: 16 % — AB (ref 11.5–15.5)
WBC: 9.8 10*3/uL (ref 4.0–10.5)

## 2015-07-25 LAB — BASIC METABOLIC PANEL
Anion gap: 5 (ref 5–15)
BUN: 18 mg/dL (ref 6–20)
CALCIUM: 7.8 mg/dL — AB (ref 8.9–10.3)
CHLORIDE: 112 mmol/L — AB (ref 101–111)
CO2: 26 mmol/L (ref 22–32)
CREATININE: 0.86 mg/dL (ref 0.44–1.00)
GFR, EST NON AFRICAN AMERICAN: 57 mL/min — AB (ref >60)
Glucose, Bld: 189 mg/dL — ABNORMAL HIGH (ref 65–99)
Potassium: 4.8 mmol/L (ref 3.5–5.1)
SODIUM: 143 mmol/L (ref 135–145)

## 2015-07-25 LAB — GLUCOSE, CAPILLARY: GLUCOSE-CAPILLARY: 165 mg/dL — AB (ref 65–99)

## 2015-07-25 LAB — MRSA PCR SCREENING: MRSA by PCR: NEGATIVE

## 2015-07-25 MED ORDER — PANTOPRAZOLE SODIUM 40 MG IV SOLR
40.0000 mg | Freq: Every day | INTRAVENOUS | Status: DC
  Administered 2015-07-25: 40 mg via INTRAVENOUS
  Filled 2015-07-25: qty 40

## 2015-07-25 MED ORDER — PANTOPRAZOLE SODIUM 40 MG PO TBEC
40.0000 mg | DELAYED_RELEASE_TABLET | Freq: Every day | ORAL | Status: DC
  Administered 2015-07-26 – 2015-07-29 (×4): 40 mg via ORAL
  Filled 2015-07-25 (×4): qty 1

## 2015-07-25 MED ORDER — FENTANYL CITRATE (PF) 100 MCG/2ML IJ SOLN
25.0000 ug | INTRAMUSCULAR | Status: DC | PRN
  Administered 2015-07-25 (×3): 25 ug via INTRAVENOUS
  Filled 2015-07-25 (×3): qty 2

## 2015-07-25 NOTE — Progress Notes (Signed)
Orthopedics Progress Note  Subjective: Patient feeling better this morning.  Objective:  Filed Vitals:   07/25/15 0830 07/25/15 0900  BP: 108/58 104/87  Pulse: 96 97  Temp:    Resp: 14 17    General: Awake and alert  Musculoskeletal: left thigh dressing CDI, drain removed compartments supple, NVI Neurovascularly intact  Lab Results  Component Value Date   WBC 9.8 07/25/2015   HGB 11.1* 07/25/2015   HCT 32.8* 07/25/2015   MCV 88.6 07/25/2015   PLT 71* 07/25/2015       Component Value Date/Time   NA 143 07/25/2015 0406   K 4.8 07/25/2015 0406   CL 112* 07/25/2015 0406   CO2 26 07/25/2015 0406   GLUCOSE 189* 07/25/2015 0406   BUN 18 07/25/2015 0406   CREATININE 0.86 07/25/2015 0406   CREATININE 0.82 08/13/2013 1201   CALCIUM 7.8* 07/25/2015 0406   GFRNONAA 57* 07/25/2015 0406   GFRAA >60 07/25/2015 0406    Lab Results  Component Value Date   INR 1.06 07/24/2015    Assessment/Plan: POD #1 s/p Procedure(s): ORIF LEFT PERIPROSTHETIC FEMUR FRACTURE; CONVERSION TO TOTAL HIP ARTHROPLASTY POSS. FEMORAL COMPONENT REVISION  Patient looks great today. 02 sats look good on room air Mobilization with PT, strict NWB on the left LE.   Hip precautions per Dr Linna CapriceSwinteck DVT prophylaxis - mechanical Pulmonary toilet Acute blood loss anemia Hgb 11 - follow  Almedia BallsSteven R. Ranell PatrickNorris, MD 07/25/2015 9:52 AM

## 2015-07-25 NOTE — Progress Notes (Signed)
Pt was brought from PACU with a blood bag not running out of the cooler. Upon arriving the PACU nurse asked this nurse to sign off the blood with her. I asked how long the blood had been out from the blood bank and she said it had been a blood bank issued cooler and had been pulled out just before leaving PACU (2335). This was the time wrote under the time issued section of the blood component tag. Blood bank was called and questioned if it was still ok to give this unit of blood to which their answer was yes. Will continue to monitor closely and administer per protocol. Modena JanskyKevin Deniesha Stenglein RN [facility].

## 2015-07-25 NOTE — Progress Notes (Deleted)
  elink  Despite lasix progressive resp disteress More tachycadrdic  A Acute resp failure  P Intubate  D.w Dr a\ Catherine over phone and video phone -she is only child and struggling with decision nmaking and illness. After long discussion resolved NO CPR, NO DEIFB, NO ACLS MEDS bu full medical care and INTUBATION.   If he does not do well on vent over few to several days she will reassess goals with MD  Dr. Larah Kuntzman, M.D., F.C.C.P Pulmonary and Critical Care Medicine Staff Physician [facility] Pager: [phone], If no answer or between 15:00h - 7:00h: call [phone]  07/25/2015 3:21 AM                

## 2015-07-25 NOTE — Plan of Care (Signed)
Problem: Activity: Goal: Ability to tolerate increased activity will improve Outcome: Progressing PT to see. Pt is touchdown weight-bearing status. Goal: Mobility will improve Outcome: Progressing See previous  Problem: Bowel/Gastric: Goal: Gastrointestinal status for postoperative course will improve Outcome: Progressing Progressing to clear liquids with no current N/V  Problem: Pain Management: Goal: Pain level will decrease Outcome: Not Progressing Pt still having high amount of pain. Elink notified and prn's added

## 2015-07-25 NOTE — Progress Notes (Addendum)
Report called to RN on 2West, bed in process of being cleaned, a/w callback. Louie BunWilson,Miguelina Fore S   11:47 AM   Bed assignment changed, report attempt to be called to RN on 5N, a/w callback. 12:24 PM   Report given Eber Jonesarolyn RN on 5N. 12:40 PM   Pt transferred to 5N via bed, vss, Presenter, broadcastingCarolyn RN at bedside, family notified. 1:08 PM

## 2015-07-25 NOTE — Progress Notes (Signed)
eLink Physician-Brief Progress Note Patient Name: Pamela [name] DOB: 01/04/1921 MRN: [id number]   Date of Service  07/25/2015  HPI/Events of Note  sup  eICU Interventions  protonix     Intervention Category Major Interventions: Other:  Heitor Steinhoff 07/25/2015, 12:14 AM

## 2015-07-25 NOTE — Evaluation (Signed)
Physical Therapy Evaluation Patient Details Name: Pamela Pamela [name] MRN: [id number] DOB: [birth year] Today'Pamela [name] Date: 07/25/2015   History of Present Illness  81 y.o. female seen following a fall resulting in Lt femur periprosthetic fracutre Pamela [name]/p Conversion of previous surgery to total hip arthroplasty and Open reduction and internal fixation of left femur fracture.  Clinical Impression  Patient is Pamela [name]/p above surgery resulting in functional limitations due to the deficits listed below (see PT Problem List). Very pleasant, although lethargic and became nauseous towards end of therapy session. Pamela Pamela [name] was able to perform bed mobility and sit<>stand transfer with mod-max assist today. Maintains toe-touch weight-bearing upon standing with a rolling walker but unable to pivot to recliner safely at this time. Patient will benefit from skilled PT to increase their independence and safety with mobility to allow discharge to the venue listed below.       Follow Up Recommendations SNF (requests [facility] (near her home))    Equipment Recommendations   (TBD next venue of care)    Recommendations for Other Services OT consult     Precautions / Restrictions Precautions Precautions: Posterior Hip (Not specified. need clarification) Precaution Booklet Issued: No Precaution Comments: Verbally reviewed posterior hip precautions Required Braces or Orthoses: Other Brace/Splint Other Brace/Splint: abduction brace on in room, not ordered Restrictions Weight Bearing Restrictions: Yes LLE Weight Bearing: Touchdown weight bearing      Mobility  Bed Mobility Overal bed mobility: Needs Assistance Bed Mobility: Supine to Sit;Sit to Sidelying     Supine to sit: Mod assist;HOB elevated   Sit to sidelying: Max assist General bed mobility comments: Mod assist for LLE support and patient to pull through PTs hand. Cues for techniques and to paintain neutral hip alignment. Very guarded but good stability  once upright on EOB. Max assist for LE support and truncal support to return to supine. Pt became very nauseous while returning to bed.  Transfers Overall transfer level: Needs assistance Equipment used: Rolling walker (2 wheeled) Transfers: Sit to/from Stand Sit to Stand: Max assist;From elevated surface         General transfer comment: Max assist for boost too stand from slightly elevated bed surface, assist for LLE support to prevent weight-bearing. use of bed pad under buttocks to assist with rise from bed. Once upright needed min assist for balance but demonstrated good RLE and bil UE strength to tolerated standing with a slight pivot on RLE. unable to fully pivot to chair this date.  Ambulation/Gait                Stairs            Wheelchair Mobility    Modified Rankin (Stroke Patients Only)       Balance Overall balance assessment: Needs assistance Sitting-balance support: No upper extremity supported;Feet supported Sitting balance-Leahy Scale: Fair     Standing balance support: Bilateral upper extremity supported Standing balance-Leahy Scale: Poor                               Pertinent Vitals/Pain Pain Assessment: Faces Faces Pain Scale: Hurts even more Pain Location: LLE Pain Descriptors / Indicators: Guarding;Grimacing Pain Intervention(Pamela [name]): Monitored during session;Repositioned;Limited activity within patient'Pamela [name] tolerance;Premedicated before session    Home Living Family/patient expects to be discharged to:: Skilled nursing facility [facility]) Living Arrangements: Alone Available Help at Discharge: Family;Available PRN/intermittently Type of Home: House  Prior Function Level of Independence: Independent               Hand Dominance        Extremity/Trunk Assessment   Upper Extremity Assessment: Defer to OT evaluation           Lower Extremity Assessment: LLE deficits/detail   LLE  Deficits / Details: decreased strength and ROM as expected post op. Limited due to guarding/pain     Communication   Communication: No difficulties  Cognition Arousal/Alertness: Lethargic;Suspect due to medications Behavior [facility] for tasks assessed/performed Overall Cognitive Status: Within Functional Limits for tasks assessed                      General Comments General comments (skin integrity, edema, etc.): SpO2 94% on room air. nausea at end of therapy session (-) emesis. RN to room.    Exercises Total Joint Exercises Ankle Circles/Pumps: AROM;Both;10 reps;Supine      Assessment/Plan    PT Assessment Patient needs continued PT services  PT Diagnosis Difficulty walking;Acute pain   PT Problem List Decreased strength;Decreased range of motion;Decreased activity tolerance;Decreased balance;Decreased mobility;Decreased knowledge of use of DME;Decreased knowledge of precautions;Pain  PT Treatment Interventions DME instruction;Gait training;Functional mobility training;Therapeutic activities;Therapeutic exercise;Balance training;Neuromuscular re-education;Wheelchair mobility training;Patient/family education;Modalities   PT Goals (Current goals can be found in the Care Plan section) Acute Rehab PT Goals Patient Stated Goal: Get well PT Goal Formulation: With patient Time For Goal Achievement: 08/08/15 Potential to Achieve Goals: Good    Frequency Min 3X/week   Barriers to discharge Decreased caregiver support Reports that she lives alone, has gone to SNF in recent past    Co-evaluation               End of Session Equipment Utilized During Treatment: Gait belt Activity Tolerance: Patient limited by fatigue;Patient limited by pain;Patient limited by lethargy Patient left: in bed;with call bell/phone within reach;with bed alarm set;with nursing/sitter in room;with SCD'Pamela [name] reapplied Nurse Communication: Mobility status;Precautions;Weight bearing  status         Time: 1355-1430 PT Time Calculation (min) (ACUTE ONLY): 35 min   Charges:   PT Evaluation $Initial PT Evaluation Tier I: 1 Procedure PT Treatments $Therapeutic Activity: 8-22 mins   PT G CodesBerton Pamela [name]:        Pamela Pamela [name] 07/25/2015, 3:22 PM  Pamela Pamela [name], Pamela Pamela [name]

## 2015-07-25 NOTE — Consult Note (Signed)
Name: Pamela [name] MRN: [id number] DOB: 1921-01-31    ADMISSION DATE:  07/23/2015 CONSULTATION DATE:  07/24/15  REFERRING MD :  Katrinka Blazing  CHIEF COMPLAINT:  S/p ORIF L femur  BRIEF PATIENT DESCRIPTION: 81yo with femur fracture after fall at home.   SIGNIFICANT EVENTS  Patient just transferred after her blood loss after surgery   STUDIES:  XR Oblique fracture extending across the left mid femur, arising at the distal aspect of the femoral stem of the patient's left hip hemiarthroplasty. Up to 1 cm of anterior and distal displacement noted, without significant angulation.   HISTORY OF PRESENT ILLNESS:  [name] w/ PMHx listed below presented to the ED after sustaining a fall at home while on her way to bed. She turned around to go to the bathroom and states that she lost her balance and fell backwards. She was evaluated in the ED and subsequently taken to the OR where Dr. Linna Caprice performed the ORIF of her left femur. She received 2U PRBC in the OR for an EBL of about 1300cc. She has a hemovac in place and is HD stable.   PAST MEDICAL HISTORY :   has a past medical history of Pacemaker (08/29/2012); ICD (implantable cardiac defibrillator) in place; SSS (sick sinus syndrome) with PAT and marked bradycardia with 3 sec pauses  (08/30/2012); Status post placement of cardiac pacemaker. 08/29/12 [employer] Jude device (08/30/2012); HTN (hypertension) (08/30/2012); Hyperlipidemia (08/30/2012); Polyneuropathy in other diseases classified elsewhere [facility]); Macular degeneration; Coronary artery disease; and Venous insufficiency.  has past surgical history that includes Cardiac catheterization (2006); Insert / replace / remove pacemaker (08/29/2012); Rotator cuff repair (Left); Abdominal hysterectomy; Cesarean section; Kidney stone surgery; Rotator cuff repair (Right); Cardiac catheterization (06/2001); Radiofrequency ablation (08/25/2010); Hip Arthroplasty (Left, 01/04/2014); and permanent pacemaker insertion (N/A,  08/29/2012). Prior to Admission medications   Medication Sig Start Date End Date Taking? Authorizing Provider  acetaminophen (TYLENOL) 500 MG tablet Take 500 mg by mouth every 6 (six) hours as needed for mild pain or moderate pain.    Yes Historical Provider, MD  aspirin EC 81 MG tablet Take 81 mg by mouth every morning.    Yes Historical Provider, MD  atorvastatin (LIPITOR) 10 MG tablet TAKE ONE (1) TABLET BY MOUTH EVERY DAY 08/28/14  Yes Marinus Maw, MD  carvedilol (COREG) 3.125 MG tablet Take 1 tablet (3.125 mg total) by mouth 2 (two) times daily. Patient taking differently: Take 3.125 mg by mouth every morning.  04/30/15  Yes Marinus Maw, MD  Cholecalciferol (VITAMIN D-3 PO) Take 1 capsule by mouth daily.   Yes Historical Provider, MD  ezetimibe (ZETIA) 10 MG tablet Take 10 mg by mouth daily.   Yes Historical Provider, MD  ferrous sulfate 325 (65 FE) MG tablet Take 325 mg by mouth 2 (two) times a week.    Yes Historical Provider, MD  furosemide (LASIX) 20 MG tablet TAKE ONE TABLET BY MOUTH DAILY AS NEEDEDFOR EDEMA 01/22/15  Yes Marinus Maw, MD  gabapentin (NEURONTIN) 100 MG capsule Take 100 mg by mouth at bedtime.   Yes Historical Provider, MD  Ibuprofen-Diphenhydramine HCl (ADVIL PM) 200-25 MG CAPS Take 1 capsule by mouth at bedtime.   Yes Historical Provider, MD  l-methylfolate-B6-B12 [facility]) 3-35-2 MG TABS TAKE ONE TABLET TWICE DAILY 01/21/15  Yes York Spaniel, MD  Multiple Vitamins-Minerals (OCUVITE PO) Take 1 tablet by mouth 2 (two) times daily.   Yes Historical Provider, MD  nitroGLYCERIN (NITROSTAT) 0.4 MG SL tablet Place  1 tablet (0.4 mg total) under the tongue every 5 (five) minutes as needed for chest pain. 06/01/15  Yes Marinus MawGregg W Taylor, MD  Polyethyl Glycol-Propyl Glycol (SYSTANE OP) Apply 1 drop to eye daily as needed (Dry Eyes).    Yes Historical Provider, MD  ramipril (ALTACE) 2.5 MG capsule Take 1 capsule (2.5 mg total) by mouth daily. 09/30/14  Yes Marinus MawGregg W Taylor, MD    vitamin B-12 (CYANOCOBALAMIN) 1000 MCG tablet Take 1,000 mcg by mouth daily.   Yes Historical Provider, MD   No Known Allergies  FAMILY HISTORY:  family history includes Congestive Heart Failure in her mother; Heart attack in her father; Heart disease in her brother, father, and mother. SOCIAL HISTORY:  reports that she has never smoked. She has never used smokeless tobacco. She reports that she does not drink alcohol or use illicit drugs.  REVIEW OF SYSTEMS:   Constitutional: Negative for weight loss and malaise/fatigue.  HENT: Negative for tinnitus.  Eyes: Negative for photophobia and pain.  Respiratory: Negative for hemoptysis and sputum production.  Cardiovascular: Positive for leg swelling. Negative for chest pain and palpitations.  Gastrointestinal: Negative for nausea, vomiting and abdominal pain.  Genitourinary: Negative for urgency and frequency.  Musculoskeletal: Positive for joint pain and falls.  Skin: Negative for itching and rash.  Neurological: Negative for focal weakness, seizures, loss of consciousness and headaches.  Endo/Heme/Allergies: Negative for environmental allergies and polydipsia.  Psychiatric/Behavioral: Negative for hallucinations and substance abuse.   SUBJECTIVE:   VITAL SIGNS: Temp:  [96.9 F (36.1 C)-98 F (36.7 C)] 97.6 F (36.4 C) (12/25 0030) Pulse Rate:  [79-99] 86 (12/25 0102) Resp:  [9-22] 13 (12/25 0102) BP: (101-166)/(48-79) 125/74 mmHg (12/25 0100) SpO2:  [95 %-100 %] 100 % (12/25 0102)  PHYSICAL EXAMINATION: General:  [name] who appears her stated age and is in no acute distress. She is AAOx4 Neuro:  Cranial nerves II-XII grossly intact. PERRL. EOMI. Sensation and strength intact. no focal deficits. Gait was not assessed due to recent surgery HEENT: Head, normocephalic, atraumatic. Ears symmetric, permeable. Eyes: no conjunctival icterus, no erythema. Nose: permeable, midline septum,  Clear oropharynx Cardiovascular: S1S2 RRR HR  92 no murmurs, rubs, or gallops auscultated. No thrills palpated.  Lungs:  Chest symmetrical with respirations, clear to auscultation bilaterally with no wheezing, crackles, equal expansion.  Abdomen:  Soft, nontender, no guarding, nondistended. Present bowel sounds. Musculoskeletal:  No muscle atrophy noted nor weakness. ROM intact, LLE limited 2/2 surgery.No swelling nor tenderness of the joints.  Skin:  No notable scars, rashes, cruises. No bed sores noted.  Lymphatics: no palpable lymphadenopathy of cervical, supraclvicular nor inguinal areas.      Recent Labs Lab 07/24/15 0105  NA 144  K 3.9  CL 107  CO2 29  BUN 29*  CREATININE 0.87  GLUCOSE 123*    Recent Labs Lab 07/24/15 0105  HGB 11.5*  HCT 36.4  WBC 5.9  PLT 143*   Dg Chest 1 View  07/24/2015  CLINICAL DATA:  81 year old female with fall and left femur pain EXAM: CHEST 1 VIEW COMPARISON:  Chest radiograph dated 01/03/2014 FINDINGS: Single-view of the chest does not demonstrate a focal consolidation. There is no pleural effusion or pneumothorax. Stable cardiac silhouette. Left pectoral pacemaker device. Lower thoracic vertebroplasty changes noted. IMPRESSION: No active disease. Electronically Signed   By: Elgie CollardArash  Radparvar M.D.   On: 07/24/2015 00:14   Pelvis Portable  07/24/2015  CLINICAL DATA:  81 year old female status post left femoral fracture with  internal fixation. EXAM: PORTABLE PELVIS 1-2 VIEWS COMPARISON:  Intraoperative fluoroscopic radiograph dated 07/24/2015 FINDINGS: There is a total left hip arthroplasty with partial visualization of the femoral component. No acute fracture or dislocation identified. There is degenerative changes of the lower lumbar spine. Cutaneous surgical clips noted over the left hip. IMPRESSION: Left hip arthroplasty with partial visualization of the femoral component. No acute fracture or dislocation. Electronically Signed   By: Elgie Collard M.D.   On: 07/24/2015 22:24   Dg C-arm  Gt 120 Min  07/24/2015  CLINICAL DATA:  Periprosthetic proximal left femur fracture.  ORIF. EXAM: DG C-ARM GT 120 MIN; LEFT FEMUR 2 VIEWS FLUOROSCOPY TIME:  C-arm fluoroscopic images were obtained intraoperatively and submitted for post operative interpretation. Please see the performing provider's procedural report for the fluoroscopy time utilized. COMPARISON:  Radiographs 07/23/2015. FINDINGS: Seven spot fluoroscopic images are submitted. Patient has undergone revision of the left hip hemiarthroplasty with performance of a total hip arthroplasty. There is a screw fixed acetabular component which appears well positioned. There is a long segment lateral femoral plate and screws with multiple cerclage wires traversing the fracture of the proximal femoral diaphysis. The fracture demonstrates near anatomic reduction. No complications identified. IMPRESSION: Near anatomic reduction of proximal femur fracture status post revision of arthroplasty, lateral plate, screw and cerclage wire fixation. No demonstrated complication. Electronically Signed   By: Carey Bullocks M.D.   On: 07/24/2015 20:14   Dg Femur Min 2 Views Left  07/24/2015  CLINICAL DATA:  Periprosthetic proximal left femur fracture.  ORIF. EXAM: DG C-ARM GT 120 MIN; LEFT FEMUR 2 VIEWS FLUOROSCOPY TIME:  C-arm fluoroscopic images were obtained intraoperatively and submitted for post operative interpretation. Please see the performing provider's procedural report for the fluoroscopy time utilized. COMPARISON:  Radiographs 07/23/2015. FINDINGS: Seven spot fluoroscopic images are submitted. Patient has undergone revision of the left hip hemiarthroplasty with performance of a total hip arthroplasty. There is a screw fixed acetabular component which appears well positioned. There is a long segment lateral femoral plate and screws with multiple cerclage wires traversing the fracture of the proximal femoral diaphysis. The fracture demonstrates near anatomic  reduction. No complications identified. IMPRESSION: Near anatomic reduction of proximal femur fracture status post revision of arthroplasty, lateral plate, screw and cerclage wire fixation. No demonstrated complication. Electronically Signed   By: Carey Bullocks M.D.   On: 07/24/2015 20:14   Dg Femur Min 2 Views Left  07/24/2015  CLINICAL DATA:  Status post fall backwards at home, landing on left side, with left femur pain. Initial encounter. EXAM: LEFT FEMUR 2 VIEWS COMPARISON:  None. FINDINGS: There is an oblique fracture extending across the left mid femur, arising at the distal aspect of the femoral stem of the patient's left hip hemiarthroplasty. There is up to 1 cm of anterior and distal displacement, without significant angulation. No additional fractures are seen. Diffuse sclerotic change is noted at the left knee. The soft tissues are difficult to fully assess on radiograph. IMPRESSION: Oblique fracture extending across the left mid femur, arising at the distal aspect of the femoral stem of the patient's left hip hemiarthroplasty. Up to 1 cm of anterior and distal displacement noted, without significant angulation. Electronically Signed   By: Roanna Raider M.D.   On: 07/24/2015 00:16   Dg Femur Port Min 2 Views Left  07/24/2015  CLINICAL DATA:  Postoperative radiograph for left femur fracture. Initial encounter. EXAM: LEFT FEMUR PORTABLE 2 VIEWS COMPARISON:  Left femur radiographs performed  07/23/2015 FINDINGS: There has been interval placement of a plate and screws along the left femur, transfixing the acute fracture in grossly anatomic alignment. Associated new cerclage wires are also seen. The patient's left hip hemiarthroplasty has also been replaced with a left hip total arthroplasty. Surrounding postoperative change is seen. Overlying skin staples are noted. IMPRESSION: Interval placement of a plate and screws along much of the left femur, transfixing acute fracture in grossly anatomic  alignment. Associated new cerclage wires noted. Left hip hemiarthroplasty has also been replaced with left hip total arthroplasty. Electronically Signed   By: Roanna Raider M.D.   On: 07/24/2015 22:19    ASSESSMENT / PLAN: [place] B2 left periprosthetic femur fracture s/p ORIF L femur fracture - pain control with morphine - periop ancef  Acute blood loss anemia  - s/p 2U PRBC  - CBC @ 2pm, transfuse for Hb <7.   Thrombocytopenia, mild - no need for transfusions at this time  Tachybrady Syndrome s/p PM - no acute issues  HTN, HLD, CAD - Resume meds in the AM  Time spent evaluating the patient, discussions, reviewing labs 33 minutes.   Deetta Perla, MD Critical Care Medicine [facility] Pager: [phone]  07/25/2015, 1:23 AM

## 2015-07-25 NOTE — Progress Notes (Signed)
Name: Pamela [name] MRN: [id number] DOB: [birth year]    ADMISSION DATE:  07/23/2015 CONSULTATION DATE:  07/24/15  REFERRING MD :  Katrinka Blazing  CHIEF COMPLAINT:  S/p ORIF L femur  BRIEF PATIENT DESCRIPTION: 81yo with femur fracture after fall at home.   SIGNIFICANT EVENTS  Patient just transferred after her blood loss after surgery   STUDIES:  XR Oblique fracture extending across the left mid femur, arising at the distal aspect of the femoral stem of the patient's left hip hemiarthroplasty. Up to 1 cm of anterior and distal displacement noted, without significant angulation.   HISTORY OF PRESENT ILLNESS:  [name] w/ PMHx listed below presented to the ED after sustaining a fall at home while on her way to bed. She turned around to go to the bathroom and states that she lost her balance and fell backwards. She was evaluated in the ED and subsequently taken to the OR where Dr. Linna Caprice performed the ORIF of her left femur. She received 2U PRBC in the OR for an EBL of about 1300cc. She has a hemovac in place and is HD stable.   SUBJECTIVE: Awake and interactive, moving all ext to command.  VITAL SIGNS: Temp:  [96.9 F (36.1 C)-98.2 F (36.8 C)] 98.2 F (36.8 C) (12/25 0722) Pulse Rate:  [79-112] 92 (12/25 1030) Resp:  [9-22] 16 (12/25 1030) BP: (87-148)/(48-87) 118/57 mmHg (12/25 1030) SpO2:  [95 %-100 %] 99 % (12/25 1030)  PHYSICAL EXAMINATION: General:  [name] who appears her stated age and is in no acute distress. She is AAOx4 Neuro:  Cranial nerves II-XII grossly intact. PERRL. EOMI. Sensation and strength intact. no focal deficits. Gait was not assessed due to recent surgery HEENT: Head, normocephalic, atraumatic. Ears symmetric, permeable. Eyes: no conjunctival icterus, no erythema. Nose: permeable, midline septum,  Clear oropharynx Cardiovascular: S1S2 RRR HR 92 no murmurs, rubs, or gallops auscultated. No thrills palpated.  Lungs:  Chest symmetrical with respirations,  clear to auscultation bilaterally with no wheezing, crackles, equal expansion.  Abdomen:  Soft, nontender, no guarding, nondistended. Present bowel sounds. Musculoskeletal:  No muscle atrophy noted nor weakness. ROM intact, LLE limited 2/2 surgery.No swelling nor tenderness of the joints.  Skin:  No notable scars, rashes, cruises. No bed sores noted.  Lymphatics: no palpable lymphadenopathy of cervical, supraclvicular nor inguinal areas.   Recent Labs Lab 07/24/15 0105 07/25/15 0406  NA 144 143  K 3.9 4.8  CL 107 112*  CO2 29 26  BUN 29* 18  CREATININE 0.87 0.86  GLUCOSE 123* 189*   Recent Labs Lab 07/24/15 0105 07/25/15 0406  HGB 11.5* 11.1*  HCT 36.4 32.8*  WBC 5.9 9.8  PLT 143* 71*   Dg Chest 1 View  07/24/2015  CLINICAL DATA:  81 year old female with fall and left femur pain EXAM: CHEST 1 VIEW COMPARISON:  Chest radiograph dated 01/03/2014 FINDINGS: Single-view of the chest does not demonstrate a focal consolidation. There is no pleural effusion or pneumothorax. Stable cardiac silhouette. Left pectoral pacemaker device. Lower thoracic vertebroplasty changes noted. IMPRESSION: No active disease. Electronically Signed   By: Elgie Collard M.D.   On: 07/24/2015 00:14   Pelvis Portable  07/24/2015  CLINICAL DATA:  81 year old female status post left femoral fracture with internal fixation. EXAM: PORTABLE PELVIS 1-2 VIEWS COMPARISON:  Intraoperative fluoroscopic radiograph dated 07/24/2015 FINDINGS: There is a total left hip arthroplasty with partial visualization of the femoral component. No acute fracture or dislocation identified. There is degenerative changes of the  lower lumbar spine. Cutaneous surgical clips noted over the left hip. IMPRESSION: Left hip arthroplasty with partial visualization of the femoral component. No acute fracture or dislocation. Electronically Signed   By: Elgie Collard M.D.   On: 07/24/2015 22:24   Dg C-arm Gt 120 Min  07/24/2015  CLINICAL DATA:   Periprosthetic proximal left femur fracture.  ORIF. EXAM: DG C-ARM GT 120 MIN; LEFT FEMUR 2 VIEWS FLUOROSCOPY TIME:  C-arm fluoroscopic images were obtained intraoperatively and submitted for post operative interpretation. Please see the performing provider's procedural report for the fluoroscopy time utilized. COMPARISON:  Radiographs 07/23/2015. FINDINGS: Seven spot fluoroscopic images are submitted. Patient has undergone revision of the left hip hemiarthroplasty with performance of a total hip arthroplasty. There is a screw fixed acetabular component which appears well positioned. There is a long segment lateral femoral plate and screws with multiple cerclage wires traversing the fracture of the proximal femoral diaphysis. The fracture demonstrates near anatomic reduction. No complications identified. IMPRESSION: Near anatomic reduction of proximal femur fracture status post revision of arthroplasty, lateral plate, screw and cerclage wire fixation. No demonstrated complication. Electronically Signed   By: Carey Bullocks M.D.   On: 07/24/2015 20:14   Dg Femur Min 2 Views Left  07/24/2015  CLINICAL DATA:  Periprosthetic proximal left femur fracture.  ORIF. EXAM: DG C-ARM GT 120 MIN; LEFT FEMUR 2 VIEWS FLUOROSCOPY TIME:  C-arm fluoroscopic images were obtained intraoperatively and submitted for post operative interpretation. Please see the performing provider's procedural report for the fluoroscopy time utilized. COMPARISON:  Radiographs 07/23/2015. FINDINGS: Seven spot fluoroscopic images are submitted. Patient has undergone revision of the left hip hemiarthroplasty with performance of a total hip arthroplasty. There is a screw fixed acetabular component which appears well positioned. There is a long segment lateral femoral plate and screws with multiple cerclage wires traversing the fracture of the proximal femoral diaphysis. The fracture demonstrates near anatomic reduction. No complications identified.  IMPRESSION: Near anatomic reduction of proximal femur fracture status post revision of arthroplasty, lateral plate, screw and cerclage wire fixation. No demonstrated complication. Electronically Signed   By: Carey Bullocks M.D.   On: 07/24/2015 20:14   Dg Femur Min 2 Views Left  07/24/2015  CLINICAL DATA:  Status post fall backwards at home, landing on left side, with left femur pain. Initial encounter. EXAM: LEFT FEMUR 2 VIEWS COMPARISON:  None. FINDINGS: There is an oblique fracture extending across the left mid femur, arising at the distal aspect of the femoral stem of the patient's left hip hemiarthroplasty. There is up to 1 cm of anterior and distal displacement, without significant angulation. No additional fractures are seen. Diffuse sclerotic change is noted at the left knee. The soft tissues are difficult to fully assess on radiograph. IMPRESSION: Oblique fracture extending across the left mid femur, arising at the distal aspect of the femoral stem of the patient's left hip hemiarthroplasty. Up to 1 cm of anterior and distal displacement noted, without significant angulation. Electronically Signed   By: Roanna Raider M.D.   On: 07/24/2015 00:16   Dg Femur Port Min 2 Views Left  07/24/2015  CLINICAL DATA:  Postoperative radiograph for left femur fracture. Initial encounter. EXAM: LEFT FEMUR PORTABLE 2 VIEWS COMPARISON:  Left femur radiographs performed 07/23/2015 FINDINGS: There has been interval placement of a plate and screws along the left femur, transfixing the acute fracture in grossly anatomic alignment. Associated new cerclage wires are also seen. The patient's left hip hemiarthroplasty has also been replaced with  a left hip total arthroplasty. Surrounding postoperative change is seen. Overlying skin staples are noted. IMPRESSION: Interval placement of a plate and screws along much of the left femur, transfixing acute fracture in grossly anatomic alignment. Associated new cerclage wires  noted. Left hip hemiarthroplasty has also been replaced with left hip total arthroplasty. Electronically Signed   By: Roanna RaiderJeffery  Chang M.D.   On: 07/24/2015 22:19   I reviewed CXR myself, no acute disease.  ASSESSMENT / PLAN: [place] B2 left periprosthetic femur fracture s/p ORIF L femur fracture - Pain control with morphine - Periop ancef  Acute blood loss anemia  - S/p 2U PRBC  - Monitor CBC  Thrombocytopenia, mild - No need for transfusions at this time  Tachybrady Syndrome s/p PM - No acute issues  HTN, HLD, CAD - Resume meds in the AM  Discussed with [facility]MD, [facility] 12/26.  Alyson ReedyWesam G. Montell Leopard, M.D. [facility]. Pager: [phone]. After hours pager: [phone].  07/25/2015, 10:47 AM

## 2015-07-26 DIAGNOSIS — I471 Supraventricular tachycardia: Secondary | ICD-10-CM | POA: Diagnosis present

## 2015-07-26 LAB — MAGNESIUM
MAGNESIUM: 1.8 mg/dL (ref 1.7–2.4)
Magnesium: 1.8 mg/dL (ref 1.7–2.4)

## 2015-07-26 LAB — CBC
HCT: 25.6 % — ABNORMAL LOW (ref 36.0–46.0)
HEMATOCRIT: 26.7 % — AB (ref 36.0–46.0)
Hemoglobin: 8.7 g/dL — ABNORMAL LOW (ref 12.0–15.0)
Hemoglobin: 8.4 g/dL — ABNORMAL LOW (ref 12.0–15.0)
MCH: 29.3 pg (ref 26.0–34.0)
MCH: 29.8 pg (ref 26.0–34.0)
MCHC: 32.6 g/dL (ref 30.0–36.0)
MCHC: 32.8 g/dL (ref 30.0–36.0)
MCV: 89.9 fL (ref 78.0–100.0)
MCV: 90.8 fL (ref 78.0–100.0)
PLATELETS: 84 10*3/uL — AB (ref 150–400)
PLATELETS: 78 10*3/uL — AB (ref 150–400)
RBC: 2.97 MIL/uL — ABNORMAL LOW (ref 3.87–5.11)
RBC: 2.82 MIL/uL — AB (ref 3.87–5.11)
RDW: 16.9 % — AB (ref 11.5–15.5)
RDW: 16.8 % — AB (ref 11.5–15.5)
WBC: 9.1 10*3/uL (ref 4.0–10.5)
WBC: 6.7 10*3/uL (ref 4.0–10.5)

## 2015-07-26 LAB — BASIC METABOLIC PANEL
ANION GAP: 4 — AB (ref 5–15)
Anion gap: 6 (ref 5–15)
BUN: 20 mg/dL (ref 6–20)
BUN: 18 mg/dL (ref 6–20)
CALCIUM: 7.8 mg/dL — AB (ref 8.9–10.3)
CALCIUM: 7.7 mg/dL — AB (ref 8.9–10.3)
CO2: 25 mmol/L (ref 22–32)
CO2: 25 mmol/L (ref 22–32)
CREATININE: 0.77 mg/dL (ref 0.44–1.00)
CREATININE: 0.74 mg/dL (ref 0.44–1.00)
Chloride: 111 mmol/L (ref 101–111)
Chloride: 112 mmol/L — ABNORMAL HIGH (ref 101–111)
GFR calc non Af Amer: >60 mL/min (ref >60)
GFR calc non Af Amer: >60 mL/min (ref >60)
Glucose, Bld: 133 mg/dL — ABNORMAL HIGH (ref 65–99)
Glucose, Bld: 155 mg/dL — ABNORMAL HIGH (ref 65–99)
Potassium: 3.9 mmol/L (ref 3.5–5.1)
Potassium: 3.9 mmol/L (ref 3.5–5.1)
SODIUM: 141 mmol/L (ref 135–145)
Sodium: 142 mmol/L (ref 135–145)

## 2015-07-26 LAB — TROPONIN I: TROPONIN I: 0.08 ng/mL — AB (ref <0.031)

## 2015-07-26 LAB — PHOSPHORUS: PHOSPHORUS: 1.6 mg/dL — AB (ref 2.5–4.6)

## 2015-07-26 MED ORDER — FOLIC ACID 1 MG PO TABS
1.0000 mg | ORAL_TABLET | Freq: Every day | ORAL | Status: DC
  Administered 2015-07-26 – 2015-07-29 (×4): 1 mg via ORAL
  Filled 2015-07-26 (×4): qty 1

## 2015-07-26 MED ORDER — MORPHINE SULFATE (PF) 2 MG/ML IV SOLN
2.0000 mg | INTRAVENOUS | Status: DC | PRN
  Filled 2015-07-26: qty 1

## 2015-07-26 MED ORDER — RAMIPRIL 2.5 MG PO CAPS
2.5000 mg | ORAL_CAPSULE | Freq: Every day | ORAL | Status: DC
  Administered 2015-07-26 – 2015-07-29 (×4): 2.5 mg via ORAL
  Filled 2015-07-26 (×4): qty 1

## 2015-07-26 MED ORDER — EZETIMIBE 10 MG PO TABS
10.0000 mg | ORAL_TABLET | Freq: Every day | ORAL | Status: DC
  Administered 2015-07-26 – 2015-07-29 (×4): 10 mg via ORAL
  Filled 2015-07-26 (×4): qty 1

## 2015-07-26 MED ORDER — VITAMIN B-12 1000 MCG PO TABS
1000.0000 ug | ORAL_TABLET | Freq: Every day | ORAL | Status: DC
  Administered 2015-07-26 – 2015-07-29 (×4): 1000 ug via ORAL
  Filled 2015-07-26 (×4): qty 1

## 2015-07-26 MED ORDER — FERROUS SULFATE 325 (65 FE) MG PO TABS
325.0000 mg | ORAL_TABLET | Freq: Three times a day (TID) | ORAL | Status: DC
  Administered 2015-07-26 – 2015-07-29 (×9): 325 mg via ORAL
  Filled 2015-07-26 (×8): qty 1

## 2015-07-26 MED ORDER — ASPIRIN EC 325 MG PO TBEC
325.0000 mg | DELAYED_RELEASE_TABLET | Freq: Two times a day (BID) | ORAL | Status: DC
  Administered 2015-07-26 – 2015-07-29 (×6): 325 mg via ORAL
  Filled 2015-07-26 (×6): qty 1

## 2015-07-26 MED ORDER — GABAPENTIN 100 MG PO CAPS
100.0000 mg | ORAL_CAPSULE | Freq: Every day | ORAL | Status: DC
  Administered 2015-07-26 – 2015-07-28 (×3): 100 mg via ORAL
  Filled 2015-07-26 (×3): qty 1

## 2015-07-26 NOTE — Progress Notes (Signed)
    Subjective: 2 Days Post-Op Procedure(s) (LRB): ORIF LEFT PERIPROSTHETIC FEMUR FRACTURE; CONVERSION TO TOTAL HIP ARTHROPLASTY POSS. FEMORAL COMPONENT REVISION  (Left) Patient reports pain as 3 on 0-10 scale.   Denies CP or SOB.  Voiding without difficulty. Positive flatus. Objective: Vital signs in last 24 hours: Temp:  [98 F (36.7 C)-100.9 F (38.3 C)] 100.9 F (38.3 C) (12/26 0519) Pulse Rate:  [86-112] 112 (12/26 0519) Resp:  [14-18] 16 (12/26 0519) BP: (87-130)/(49-62) 130/49 mmHg (12/26 0519) SpO2:  [92 %-99 %] 92 % (12/26 0519)  Intake/Output from previous day: 12/25 0701 - 12/26 0700 In: 590 [P.O.:240; I.V.:300; IV Piggyback:50] Out: 1070 [Urine:1050; Drains:20] Intake/Output this shift:    Labs:  Recent Labs  07/24/15 0105 07/25/15 0406 07/26/15 0420  HGB 11.5* 11.1* 8.7*    Recent Labs  07/25/15 0406 07/26/15 0420  WBC 9.8 9.1  RBC 3.70* 2.97*  HCT 32.8* 26.7*  PLT 71* 84*    Recent Labs  07/25/15 0406 07/26/15 0420  NA 143 142  K 4.8 3.9  CL 112* 111  CO2 26 25  BUN 18 20  CREATININE 0.86 0.77  GLUCOSE 189* 133*  CALCIUM 7.8* 7.8*    Recent Labs  07/24/15 0105  INR 1.06    Physical Exam: Neurologically intact Intact pulses distally Incision: dressing C/D/I Compartment soft  Assessment/Plan: 2 Days Post-Op Procedure(s) (LRB): ORIF LEFT PERIPROSTHETIC FEMUR FRACTURE; CONVERSION TO TOTAL HIP ARTHROPLASTY POSS. FEMORAL COMPONENT REVISION  (Left) Advance diet Up with therapy  Post-op anemia HCT - 26.7.  Will discuss question of transfusion with medical team  Recardo Linn D for Dr. Venita Lickahari Oluwaseyi [facility] [phone] 07/26/2015, 9:08 AM

## 2015-07-26 NOTE — Progress Notes (Signed)
Occupational Therapy Evaluation Patient Details Name: Pamela [name] MRN: [id number] DOB: 1921-05-27 Today's Date: 07/26/2015    History of Present Illness 81 y.o. female seen following a fall resulting in Lt femur periprosthetic fracutre s/p Conversion of previous surgery to total hip arthroplasty and Open reduction and internal fixation of left femur fracture.   Clinical Impression   PTA, pt lived alone and was independent with ADL and mobility. Pt presents with significant functional decline, requiring Max A +2 for mobility and Total A for LB ADL,  and [facility] Will follow acutely to address established goals.     Follow Up Recommendations  SNF;Supervision/Assistance - 24 hour    Equipment Recommendations  Other (comment) (TBD)    Recommendations for Other Services       Precautions / Restrictions Precautions Precautions: Posterior Hip (Not specified. need clarification) Precaution Booklet Issued: No Precaution Comments: Verbally reviewed posterior hip precautions Required Braces or Orthoses: Other Brace/Splint (abduction wedge) Other Brace/Splint: abduction brace on in room, not ordered Restrictions Weight Bearing Restrictions: Yes LLE Weight Bearing: Non weight bearing LLE: NWB per Dr. Dietrich PatesNorris's note      Mobility Bed Mobility Overal bed mobility: Needs Assistance;+2 for physical assistance Bed Mobility: Supine to Sit;Sit to Supine     Supine to sit: HOB elevated;Max assist Sit to supine: Total assist;+2 for physical assistance   General bed mobility comments: more difficulty with bed mobility today  Transfers Overall transfer level: Needs assistance Equipment used: Rolling walker (2 wheeled) Transfers: Sit to/from Stand           General transfer comment: Attmepte x 3 to boost. Pt unable. No recall of precautions from yesterday's PT session Pt leaning posteriorly in bed. ? meds    Balance     Sitting balance-Leahy Scale:  Poor                                      ADL Overall ADL's : Needs assistance/impaired Eating/Feeding: Set up   Grooming: Set up;Supervision/safety;Sitting   Upper Body Bathing: Set up;Supervision/ safety;Sitting Upper Body Bathing Details (indicate cue type and reason): unable to complete EOB due to needing BUE for support     Upper Body Dressing : Minimal assistance   Lower Body Dressing: Total assistance   Toilet Transfer: +2 for physical assistance Toilet Transfer Details (indicate cue type and reason): unable to attempt today         Functional mobility during ADLs: +2 for physical assistance;Maximal assistance General ADL Comments: Lethargic. ? related to medication although nsg states pt running low grade temp. Pt not realizing she was back into bed. Unable to sit with hands on lap due to leaning posteriorly - most likely related to meds.                      Pertinent Vitals/Pain Pain Assessment: Faces Pain Location: LLE Pain Descriptors / Indicators: Aching;Discomfort;Grimacing;Guarding Pain Intervention(s): Limited activity within patient's tolerance;Repositioned;Monitored during session     Hand Dominance     Extremity/Trunk Assessment Upper Extremity Assessment Upper Extremity Assessment: Generalized weakness   Lower Extremity Assessment Lower Extremity Assessment: Defer to PT evaluation LLE Deficits / Details: decreased strength and ROM as expected post op. Limited due to guarding/pain LLE: Unable to fully assess due to pain   Cervical / Trunk Assessment Cervical / Trunk Assessment: Kyphotic   Communication Communication  Communication: No difficulties   Cognition Arousal/Alertness: Lethargic;Suspect due to medications Behavior During Therapy: [facility] for tasks assessed/performed Overall Cognitive Status: Impaired/Different from baseline Area of Impairment: Attention;Memory;Safety/judgement;Awareness;Problem solving   Current  Attention Level: Selective Memory: Decreased recall of precautions;Decreased short-term memory   Safety/Judgement: Decreased awareness of safety;Decreased awareness of deficits Awareness: Intellectual Problem Solving: Slow processing;Decreased initiation;Difficulty sequencing;Requires verbal cues;Requires tactile cues General Comments: most likely due to [facility]   General Comments       Exercises       Shoulder Instructions      Home Living Family/patient expects to be discharged to:: Skilled nursing facility [facility]) Living Arrangements: Alone Available Help at Discharge: Family;Available PRN/intermittently Type of Home: House                                  Prior Functioning/Environment Level of Independence: Independent             OT Diagnosis: Generalized weakness;Acute pain   OT Problem List: Decreased strength;Decreased range of motion;Decreased activity tolerance;Impaired balance (sitting and/or standing);Decreased safety awareness;Decreased knowledge of use of DME or AE;Decreased knowledge of precautions;Obesity;Pain   OT Treatment/Interventions: Self-care/ADL training;Therapeutic exercise;DME and/or AE instruction;Therapeutic activities;Patient/family education;Balance training    OT Goals(Current goals can be found in the care plan section) Acute Rehab OT Goals Patient Stated Goal: none stated OT Goal Formulation: Patient unable to participate in goal setting Time For Goal Achievement: 08/03/14 Potential to Achieve Goals: Good ADL Goals Pt Will Perform Grooming: with set-up;sitting Pt Will Perform Upper Body Bathing: with set-up;with supervision;sitting (EOB) Pt Will Transfer to Toilet: with min assist;bedside commode;stand pivot transfer Pt Will Perform Toileting - Clothing Manipulation and hygiene: with min assist;sit to/from stand Additional ADL Goal #1: Pt will demonstrate verbal understadning of 3/3 hip post hip precautions  OT  Frequency: Min 2X/week   Barriers to D/C:            Co-evaluation              End of Session Equipment Utilized During Treatment: Gait belt Nurse Communication: Mobility status;Precautions;Weight bearing status  Activity Tolerance: Patient limited by lethargy;Patient limited by pain Patient left: in bed;with call bell/phone within reach;with bed alarm set   Time: 1100-1125 OT Time Calculation (min): 25 min Charges:  OT General Charges $OT Visit: 1 Procedure OT Evaluation $Initial OT Evaluation Tier I: 1 Procedure OT Treatments $Self Care/Home Management : 8-22 mins G-Codes:    Pamela [name],Pamela [name] August 23, 2015, 11:52 AM   Pamela [name], OTR/L  [phone] 2015/08/23

## 2015-07-26 NOTE — Progress Notes (Signed)
[facility] Progress Note  Patient: Pamela [name] WUJ:[id number]   PCP: Trinna Post, MD DOB: 10-Sep-1920   DOA: 07/23/2015   DOS: 07/26/2015   Date of Service: the patient was seen and examined on 07/26/2015  Subjective: patient mentions her pain is tolerable. Denies any nausea or vomiting or abdominal pain. Has not been having any bowel movements yet Nutrition: Able to tolerate oral diet Activity: Walking with physical therapy Last BM: 07/24/2015  Assessment and Plan: 1. Femur fracture, left (HCC) Presented with a mechanical fall. Underwent a reason after a few motor component of left hip hemiarthroplasty to total hip arthroplasty with open reduction and internal fixation of the left femur fracture. Patient had significant blood loss post procedure and has received 6 PRBC transfusion. At present her H&H remained stable. She does not have any active bleeding. Pain appears to be well controlled. We'll continue to monitor on telemetry.  2. Postoperative blood loss anemia. ThromboCytopenia. Platelets are lower most likely dilutional due to receiving PRBC without any platelet transfusion. At present no hematoma or active bleeding we'll continue to closely monitor. Requested orthopedic to discontinue Lovenox and switch the patient to aspirin with SCD and teds. Start on iron supplement B-12 and folic acid. Monitor platelet count daily and transfuse if less than 20 or active bleeding.  3. chronic disease. Currently stable continue monitoring.  4 essential hypertension. Blood pressures are stable. Continuing home medication. Holding Lasix. Resuming ACEi   5. SVT. Patient had brief episode of SVT on telemetry. BMP troponin magnesium EKG does not showing acute abnormality. Most likely related to pain. We'll continue close monitoring. Continue beta blocker   DVT Prophylaxis: mechanical compression device Nutrition: Cardiac diet Advance goals of care discussion: full  code  Brief Summary of Hospitalization:  HPI: As per the H and P dictated on admission, "Ms. Pamela [name] is a 81 year old female with a past medical history significant for tachybradycardia syndrome status post pacemaker, previous left hip replacement, hypertension, hyperlipidemia, coronary artery disease; who presents after sustaining a fall at home. She notes that she had been on her way to bed and had turned around to go to the bathroom and somehow lost balance and fell backwards. She really felt pain in her left leg. She rates pain as a 8 out of 10 on the pain scale. Pain worsened by any movement. She was taken to [facility] where x-rays revealed an oblique fracture extending across the left mid femur, arising at the distal aspect of the femoral stem of the patient's left hip hemiarthroplasty. The ED physician contacted orthopedics who accepted transfer if admitted to the hospitalist service." Daily update, Procedures: 07/24/2015 underwent complex arthroplasty, was monitored in the ICU 07/25/2015, transferred out of the ICU to 07/26/2015. Consultants: orthopedic, critical care Antibiotics: Anti-infectives    Start     Dose/Rate Route Frequency Ordered Stop   07/25/15 0230  ceFAZolin (ANCEF) IVPB 2 g/50 mL premix     2 g 100 mL/hr over 30 Minutes Intravenous Every 6 hours 07/24/15 2346 07/25/15 0801   07/24/15 1400  ceFAZolin (ANCEF) IVPB 2 g/50 mL premix  Status:  Discontinued     2 g 100 mL/hr over 30 Minutes Intravenous To Surgery 07/24/15 1359 07/24/15 2346   07/24/15 1346  ceFAZolin (ANCEF) 2-3 GM-% IVPB SOLR    Comments:  Shireen Quan   : cabinet override      07/24/15 1346 07/24/15 2050   07/24/15 1345  ceFAZolin (ANCEF) IVPB 2 g/50 mL premix  2 g 100 mL/hr over 30 Minutes Intravenous  Once 07/24/15 1340 07/24/15 1430       Family Communication: no family was present at bedside, at the time of interview.   Disposition:  Expected discharge date: 07/27/2015 Barriers to  safe discharge: stabilization of the blood count    Intake/Output Summary (Last 24 hours) at 07/26/15 1738 Last data filed at 07/26/15 1700  Gross per 24 hour  Intake    330 ml  Output    900 ml  Net   -570 ml   Filed Weights   07/23/15 2307  Weight: 68.947 kg (152 lb)    Objective: Physical Exam: Filed Vitals:   07/25/15 2210 07/26/15 0519 07/26/15 1142 07/26/15 1400  BP: 125/49 130/49 101/49 144/54  Pulse: 102 112 98 103  Temp: 100.1 F (37.8 C) 100.9 F (38.3 C) 99.6 F (37.6 C) 99.4 F (37.4 C)  TempSrc: Oral Oral Oral   Resp: Height:      Weight:      SpO2: 92% 92% 93% 95%     General: Appear in mild distress, no Rash; Oral Mucosa moist. Cardiovascular: S1 and S2 Present, no Murmur, no JVD Respiratory: Bilateral Air entry present and Clear to Auscultation, no Crackles, no wheezes Abdomen: Bowel Sound present, Soft and no tenderness Extremities: no Pedal edema, no calf tenderness Neurology: Grossly no focal neuro deficit.  Data Reviewed: CBC:  Recent Labs Lab 07/24/15 0105 07/25/15 0406 07/26/15 0420 07/26/15 1213  WBC 5.9 9.8 9.1 6.7  NEUTROABS 4.4  --   --   --   HGB 11.5* 11.1* 8.7* 8.4*  HCT 36.4 32.8* 26.7* 25.6*  MCV 95.8 88.6 89.9 90.8  PLT 143* 71* 84* 78*   Basic Metabolic Panel:  Recent Labs Lab 07/24/15 0105 07/25/15 0406 07/26/15 0420 07/26/15 1213  NA 144 143 142 141  K 3.9 4.8 3.9 3.9  CL 107 112* 111 112*  CO2 GLUCOSE 123* 189* 133* 155*  BUN 29* CREATININE 0.87 0.86 0.77 0.74  CALCIUM 9.4 7.8* 7.8* 7.7*  MG  --   --  1.8 1.8  PHOS  --   --  1.6*  --    Liver Function Tests:  Recent Labs Lab 07/24/15 0105  AST 32  ALT 22  ALKPHOS 42  BILITOT 0.6  PROT 6.3*  ALBUMIN 4.2   No results for input(s): LIPASE, AMYLASE in the last 168 hours. No results for input(s): AMMONIA in the last 168 hours.  Cardiac Enzymes:  Recent Labs Lab 07/26/15 1213  TROPONINI 0.08*    BNP (last  3 results) No results for input(s): BNP in the last 8760 hours.  CBG:  Recent Labs Lab 07/24/15 2238 07/24/15 2349  GLUCAP 174* 165*    Recent Results (from the past 240 hour(s))  MRSA PCR Screening     Status: None   Collection Time: 07/25/15  9:51 AM  Result Value Ref Range Status   MRSA by PCR NEGATIVE NEGATIVE Final    Comment:        The GeneXpert MRSA Assay (FDA approved for NASAL specimens only), is one component of a comprehensive MRSA colonization surveillance program. It is not intended to diagnose MRSA infection nor to guide or monitor treatment for MRSA infections.      Studies: No results found.   Scheduled Meds: . aspirin EC  325 mg Oral BID  . atorvastatin  10 mg  Oral q1800  . carvedilol  3.125 mg Oral q morning - 10a  . ezetimibe  10 mg Oral Daily  . ferrous sulfate  325 mg Oral TID WC  . folic acid  1 mg Oral Daily  . gabapentin  100 mg Oral QHS  . pantoprazole  40 mg Oral Daily  . ramipril  2.5 mg Oral Daily  . vitamin B-12  1,000 mcg Oral Daily   Continuous Infusions:  PRN Meds: acetaminophen **OR** acetaminophen, fentaNYL (SUBLIMAZE) injection, HYDROcodone-acetaminophen, hydroxypropyl methylcellulose / hypromellose, menthol-cetylpyridinium **OR** phenol, morphine injection, nitroGLYCERIN, ondansetron **OR** ondansetron (ZOFRAN) IV  Time spent: 30 minutes  Author: Lynden OxfordPranav Patel, MD [facility] Pager: [phone] 07/26/2015 5:38 PM  If 7PM-7AM, please contact night-coverage at [email or web address], password [email or web address]

## 2015-07-27 ENCOUNTER — Encounter (HOSPITAL_COMMUNITY): Payer: Self-pay | Admitting: Orthopedic Surgery

## 2015-07-27 DIAGNOSIS — D62 Acute posthemorrhagic anemia: Secondary | ICD-10-CM | POA: Diagnosis not present

## 2015-07-27 LAB — BASIC METABOLIC PANEL
ANION GAP: 4 — AB (ref 5–15)
BUN: 16 mg/dL (ref 6–20)
CALCIUM: 7.9 mg/dL — AB (ref 8.9–10.3)
CO2: 26 mmol/L (ref 22–32)
Chloride: 112 mmol/L — ABNORMAL HIGH (ref 101–111)
Creatinine, Ser: 0.68 mg/dL (ref 0.44–1.00)
GFR calc Af Amer: >60 mL/min (ref >60)
GLUCOSE: 124 mg/dL — AB (ref 65–99)
POTASSIUM: 4.1 mmol/L (ref 3.5–5.1)
SODIUM: 142 mmol/L (ref 135–145)

## 2015-07-27 LAB — PREPARE RBC (CROSSMATCH)

## 2015-07-27 LAB — CBC
HCT: 22.6 % — ABNORMAL LOW (ref 36.0–46.0)
HCT: 27.7 % — ABNORMAL LOW (ref 36.0–46.0)
HEMOGLOBIN: 9.2 g/dL — AB (ref 12.0–15.0)
Hemoglobin: 7.3 g/dL — ABNORMAL LOW (ref 12.0–15.0)
MCH: 29.9 pg (ref 26.0–34.0)
MCH: 30.5 pg (ref 26.0–34.0)
MCHC: 32.3 g/dL (ref 30.0–36.0)
MCHC: 33.2 g/dL (ref 30.0–36.0)
MCV: 92.6 fL (ref 78.0–100.0)
MCV: 91.7 fL (ref 78.0–100.0)
PLATELETS: 81 10*3/uL — AB (ref 150–400)
Platelets: 118 10*3/uL — ABNORMAL LOW (ref 150–400)
RBC: 2.44 MIL/uL — AB (ref 3.87–5.11)
RBC: 3.02 MIL/uL — AB (ref 3.87–5.11)
RDW: 16.6 % — AB (ref 11.5–15.5)
RDW: 16.1 % — ABNORMAL HIGH (ref 11.5–15.5)
WBC: 5.5 10*3/uL (ref 4.0–10.5)
WBC: 6.8 10*3/uL (ref 4.0–10.5)

## 2015-07-27 LAB — TROPONIN I: TROPONIN I: 0.1 ng/mL — AB (ref <0.031)

## 2015-07-27 LAB — POCT I-STAT 4, (NA,K, GLUC, HGB,HCT)
GLUCOSE: 165 mg/dL — AB (ref 65–99)
HCT: 26 % — ABNORMAL LOW (ref 36.0–46.0)
Hemoglobin: 8.8 g/dL — ABNORMAL LOW (ref 12.0–15.0)
POTASSIUM: 4.7 mmol/L (ref 3.5–5.1)
SODIUM: 142 mmol/L (ref 135–145)

## 2015-07-27 MED ORDER — SODIUM CHLORIDE 0.9 % IV SOLN: Freq: Once | INTRAVENOUS | Status: DC

## 2015-07-27 MED ORDER — ACETAMINOPHEN 325 MG PO TABS
650.0000 mg | ORAL_TABLET | Freq: Once | ORAL | Status: AC
  Administered 2015-07-27: 650 mg via ORAL
  Filled 2015-07-27: qty 2

## 2015-07-27 NOTE — Care Management Important Message (Signed)
Important Message  Patient Details  Name: Pamela [name] MRN: [id number] Date of Birth: 10/02/1920   Medicare Important Message Given:  Yes    [name], [name] 07/27/2015, 12:46 PM

## 2015-07-27 NOTE — Progress Notes (Signed)
Physical Therapy Treatment Patient Details Name: Pamela [name] MRN: [id number] DOB: 06/11/1921 Today's Date: 07/27/2015    History of Present Illness 81 y.o. female seen following a fall resulting in Lt femur periprosthetic fracutre s/p Conversion of previous surgery to total hip arthroplasty and Open reduction and internal fixation of left femur fracture.    PT Comments    Patient with overall mobility level of mod/max A for bed mobility and max A +2 for transfer. Pt is pleasant and willing to work with therapy. Pt is limited by pain and weakness with difficulty maintaining PWB status during stand pivot transfer. Continue to progress as tolerated with anticipated d/c to SNF.   Follow Up Recommendations  SNF (requests [facility] (near her home))     Equipment Recommendations   (TBD next venue of care)    Recommendations for Other Services OT consult     Precautions / Restrictions Precautions Precautions: Posterior Hip (Not specified. need clarification) Precaution Booklet Issued: No Precaution Comments: Verbally reviewed posterior hip precautions Required Braces or Orthoses: Other Brace/Splint Restrictions Weight Bearing Restrictions: Yes LLE Weight Bearing: Partial weight bearing    Mobility  Bed Mobility Overal bed mobility: Needs Assistance Bed Mobility: Supine to Sit;Sit to Sidelying     Supine to sit: Mod assist;Max assist;+2 for physical assistance     General bed mobility comments: Mod A for bringing bilat LE to EOB; Max A for elevating trunk into sitting with vc for technique/sequencing and hand placement upon sitting position; pt reported feeling weak when sitting up but not dizzy or nauseous; pt able to scoot hips in sitting toward EOB with extra time  Transfers Overall transfer level: Needs assistance Equipment used: Rolling walker (2 wheeled) Transfers: Stand Pivot Transfers   Stand pivot transfers: Max assist;+2 physical assistance       General  transfer comment: max vc for hand/foot placement, technique, and maintaining PWB status; max A for powering up into satnding; pt unable to pivot R LE and maintain PWB on L LE without max a to move R foot; pt unable to reach for chair for descending onto chair  Ambulation/Gait                 Stairs            Wheelchair Mobility    Modified Rankin (Stroke Patients Only)       Balance Overall balance assessment: Needs assistance Sitting-balance support: Feet supported Sitting balance-Leahy Scale: Fair     Standing balance support: Bilateral upper extremity supported Standing balance-Leahy Scale: Poor                      Cognition Arousal/Alertness: Awake/alert Behavior During Therapy: WFL for tasks assessed/performed Overall Cognitive Status: Within Functional Limits for tasks assessed                      Exercises      General Comments General comments (skin integrity, edema, etc.): recalled 2/3 hip precautions at beginning of session      Pertinent Vitals/Pain Pain Assessment: Faces Faces Pain Scale: Hurts little more Pain Location: L LE Pain Descriptors / Indicators: Sore Pain Intervention(s): Limited activity within patient's tolerance;Premedicated before session;Monitored during session;Repositioned    Home Living                      Prior Function            PT Goals (current  goals can now be found in the care plan section) Acute Rehab PT Goals Patient Stated Goal: Get well PT Goal Formulation: With patient Time For Goal Achievement: 08/08/15 Potential to Achieve Goals: Good    Frequency  Min 3X/week    PT Plan      Co-evaluation             End of Session Equipment Utilized During Treatment: Gait belt Activity Tolerance: Patient tolerated treatment well Patient left: with call bell/phone within reach;in chair;with chair alarm set (staff present)     Time: [id number] PT Time Calculation (min)  (ACUTE ONLY): 30 min  Charges:  $Therapeutic Activity: 23-37 mins                    G Codes:      Derek MoundKellyn R Adelina Collard Britni Driscoll, PTA Pager: [phone]   07/27/2015, 3:39 PM

## 2015-07-27 NOTE — Progress Notes (Signed)
[facility] Progress Note  Patient: Pamela [name] ZOX:[id number]   PCP: Trinna Post, MD DOB: 12-26-1920   DOA: 07/23/2015   DOS: 07/27/2015   Date of Service: the patient was seen and examined on 07/27/2015  Subjective: Patient denies having any acute complaint. No nausea no vomiting. No chest pain. The pain is moderately controlled. Nutrition: Able to tolerate oral diet Activity: Walking with physical therapy Last BM: 07/24/2015  Assessment and Plan: 1. Femur fracture, left (HCC) Presented with a mechanical fall. Underwent a reason after a few motor component of left hip hemiarthroplasty to total hip arthroplasty with open reduction and internal fixation of the left femur fracture. Management as per surgery.   2. Postoperative blood loss anemia. ThromboCytopenia. Patient had significant blood loss post procedure and has received 6 PRBC transfusion. Platelets are lower most likely dilutional due to receiving PRBC without any platelet transfusion.  Requested orthopedic to discontinue Lovenox and switch the patient to aspirin with SCD and teds. Lovenox can be resumed when platelet counts are more than 100 and her hemoglobin is stable.  Start on iron supplement B-12 and folic acid. Since her H&H is down 1 g today, I would transfuse her with one unit of platelet and 1 unit of PRBC,. premedication with Tylenol   3. Chronic kidney disease. Currently stable continue monitoring.  4 essential hypertension. Blood pressures are stable. Continuing home medication. Holding Lasix. Resuming ACEi   5. SVT. Patient had brief episode of SVT on telemetry. BMP troponin magnesium EKG does not showing acute abnormality. Most likely related to pain. We'll continue telemetry monitoring. Continue beta blocker   DVT Prophylaxis: mechanical compression device, aspirin  Nutrition: Cardiac diet Advance goals of care discussion: full code  Brief Summary of Hospitalization:  HPI:  As per the H and P dictated on admission, "Ms. Huertas is a 81 year old female with a past medical history significant for tachybradycardia syndrome status post pacemaker, previous left hip replacement, hypertension, hyperlipidemia, coronary artery disease; who presents after sustaining a fall at home. She notes that she had been on her way to bed and had turned around to go to the bathroom and somehow lost balance and fell backwards. She really felt pain in her left leg. She rates pain as a 8 out of 10 on the pain scale. Pain worsened by any movement. She was taken to [facility] where x-rays revealed an oblique fracture extending across the left mid femur, arising at the distal aspect of the femoral stem of the patient's left hip hemiarthroplasty. The ED physician contacted orthopedics who accepted transfer if admitted to the hospitalist service." Daily update, Procedures: 07/24/2015 underwent complex arthroplasty, was monitored in the ICU 07/25/2015, transferred out of the ICU to 07/26/2015. Consultants: orthopedic, critical care Antibiotics: Anti-infectives    Start     Dose/Rate Route Frequency Ordered Stop   07/25/15 0230  ceFAZolin (ANCEF) IVPB 2 g/50 mL premix     2 g 100 mL/hr over 30 Minutes Intravenous Every 6 hours 07/24/15 2346 07/25/15 0801   07/24/15 1400  ceFAZolin (ANCEF) IVPB 2 g/50 mL premix  Status:  Discontinued     2 g 100 mL/hr over 30 Minutes Intravenous To Surgery 07/24/15 1359 07/24/15 2346   07/24/15 1346  ceFAZolin (ANCEF) 2-3 GM-% IVPB SOLR    Comments:  Shireen Quan   : cabinet override      07/24/15 1346 07/24/15 2050   07/24/15 1345  ceFAZolin (ANCEF) IVPB 2 g/50 mL premix  2 g 100 mL/hr over 30 Minutes Intravenous  Once 07/24/15 1340 07/24/15 1430      Family Communication: no family was present at bedside, at the time of interview.   Disposition:  Expected discharge date: 07/27/2015 Barriers to safe discharge: stabilization of the blood count     Intake/Output Summary (Last 24 hours) at 07/27/15 1343 Last data filed at 07/27/15 0900  Gross per 24 hour  Intake    720 ml  Output   1050 ml  Net   -330 ml   Filed Weights   07/23/15 2307  Weight: 68.947 kg (152 lb)    Objective: Physical Exam: Filed Vitals:   07/27/15 1048 07/27/15 1131 07/27/15 1213 07/27/15 1330  BP: 118/47 131/53 131/53 122/48  Pulse: 91 87  90  Temp:  99.2 F (37.3 C)  98.8 F (37.1 C)  TempSrc:  Oral  Oral  Resp:  16  17  Height:      Weight:      SpO2:  95%  95%    General: Appear in mild distress, no Rash; Oral Mucosa moist. Cardiovascular: S1 and S2 Present, no Murmur, no JVD Respiratory: Bilateral Air entry present and Clear to Auscultation, no Crackles, no wheezes Abdomen: Bowel Sound present, Soft and no tenderness Extremities: no Pedal edema, no calf tenderness  Data Reviewed: CBC:  Recent Labs Lab 07/24/15 0105 07/24/15 2035 07/25/15 0406 07/26/15 0420 07/26/15 1213 07/27/15 0625  WBC 5.9  --  9.8 9.1 6.7 5.5  NEUTROABS 4.4  --   --   --   --   --   HGB 11.5* 8.8* 11.1* 8.7* 8.4* 7.3*  HCT 36.4 26.0* 32.8* 26.7* 25.6* 22.6*  MCV 95.8  --  88.6 89.9 90.8 92.6  PLT 143*  --  71* 84* 78* 81*   Basic Metabolic Panel:  Recent Labs Lab 07/24/15 0105 07/24/15 2035 07/25/15 0406 07/26/15 0420 07/26/15 1213 07/27/15 0625  NA 144 142 143 142 141 142  K 3.9 4.7 4.8 3.9 3.9 4.1  CL 107  --  112* 111 112* 112*  CO2 29  --  GLUCOSE 123* 165* 189* 133* 155* 124*  BUN 29*  --  CREATININE 0.87  --  0.86 0.77 0.74 0.68  CALCIUM 9.4  --  7.8* 7.8* 7.7* 7.9*  MG  --   --   --  1.8 1.8  --   PHOS  --   --   --  1.6*  --   --    Liver Function Tests:  Recent Labs Lab 07/24/15 0105  AST 32  ALT 22  ALKPHOS 42  BILITOT 0.6  PROT 6.3*  ALBUMIN 4.2   No results for input(s): LIPASE, AMYLASE in the last 168 hours. No results for input(s): AMMONIA in the last 168 hours.  Cardiac  Enzymes:  Recent Labs Lab 07/26/15 1213 07/27/15 0625  TROPONINI 0.08* 0.10*    BNP (last 3 results) No results for input(s): BNP in the last 8760 hours.  CBG:  Recent Labs Lab 07/24/15 2238 07/24/15 2349  GLUCAP 174* 165*    Recent Results (from the past 240 hour(s))  MRSA PCR Screening     Status: None   Collection Time: 07/25/15  9:51 AM  Result Value Ref Range Status   MRSA by PCR NEGATIVE NEGATIVE Final    Comment:        The GeneXpert MRSA Assay (FDA approved for NASAL  specimens only), is one component of a comprehensive MRSA colonization surveillance program. It is not intended to diagnose MRSA infection nor to guide or monitor treatment for MRSA infections.      Studies: No results found.   Scheduled Meds: . sodium chloride   Intravenous Once  . aspirin EC  325 mg Oral BID  . atorvastatin  10 mg Oral q1800  . carvedilol  3.125 mg Oral q morning - 10a  . ezetimibe  10 mg Oral Daily  . ferrous sulfate  325 mg Oral TID WC  . folic acid  1 mg Oral Daily  . gabapentin  100 mg Oral QHS  . pantoprazole  40 mg Oral Daily  . ramipril  2.5 mg Oral Daily  . vitamin B-12  1,000 mcg Oral Daily   Continuous Infusions:  PRN Meds: acetaminophen **OR** acetaminophen, fentaNYL (SUBLIMAZE) injection, HYDROcodone-acetaminophen, hydroxypropyl methylcellulose / hypromellose, menthol-cetylpyridinium **OR** phenol, morphine injection, nitroGLYCERIN, ondansetron **OR** ondansetron (ZOFRAN) IV  Time spent: 30 minutes  Author: Lynden OxfordPranav Yailine Ballard, MD [facility] Pager: [phone] 07/27/2015 1:43 PM  If 7PM-7AM, please contact night-coverage at [email or web address], password [email or web address]

## 2015-07-27 NOTE — Progress Notes (Signed)
   Subjective:  Patient reports pain as mild to moderate.  No c/o.  Objective:   VITALS:   Filed Vitals:   07/26/15 1400 07/26/15 2023 07/26/15 2149 07/27/15 0526  BP: 144/54 103/49 111/39 102/59  Pulse: 103 96  98  Temp: 99.4 F (37.4 C) 98.8 F (37.1 C)  98.7 F (37.1 C)  TempSrc:  Oral  Oral  Resp: 16 16  18   Height:      Weight:      SpO2: 95% 92%  96%    ABD soft Sensation intact distally Intact pulses distally Dorsiflexion/Plantar flexion intact Incision: dressing C/D/I Compartment soft   Lab Results  Component Value Date   WBC 5.5 07/27/2015   HGB 7.3* 07/27/2015   HCT 22.6* 07/27/2015   MCV 92.6 07/27/2015   PLT 81* 07/27/2015   BMET    Component Value Date/Time   NA 142 07/27/2015 0625   K 4.1 07/27/2015 0625   CL 112* 07/27/2015 0625   CO2 26 07/27/2015 0625   GLUCOSE 124* 07/27/2015 0625   BUN 16 07/27/2015 0625   CREATININE 0.68 07/27/2015 0625   CREATININE 0.82 08/13/2013 1201   CALCIUM 7.9* 07/27/2015 0625   GFRNONAA >60 07/27/2015 0625   GFRAA >60 07/27/2015 0625     Assessment/Plan: 3 Days Post-Op   Principal Problem:   Femur fracture, left (HCC) Active Problems:   Status post placement of cardiac pacemaker. 08/29/12 [employer] device   HTN (hypertension)   Hyperlipidemia   Thrombocytopenia, unspecified (HCC)   Anemia, iron deficiency   Chronic kidney disease   Fall at home   Periprosthetic fracture around internal prosthetic left hip joint (HCC)   SVT (supraventricular tachycardia) (HCC)   TDWB RLE with walker Posterior hip precautions ABLA: transfuse for hgb < 7.0 DVT ppx: ASA 325 mg PO BID (per hospitalist), SCDs, TEDs PO pain control PT/OT Dispo: SNF placement   Denai Caba, Cloyde ReamsBrian James 07/27/2015, 7:38 AM   Samson FredericBrian Taryne Kiger, MD Cell [phone]

## 2015-07-28 ENCOUNTER — Encounter (HOSPITAL_COMMUNITY): Payer: Self-pay | Admitting: Orthopedic Surgery

## 2015-07-28 DIAGNOSIS — I498 Other specified cardiac arrhythmias: Secondary | ICD-10-CM

## 2015-07-28 DIAGNOSIS — W19XXXD Unspecified fall, subsequent encounter: Secondary | ICD-10-CM

## 2015-07-28 DIAGNOSIS — M9702XD Periprosthetic fracture around internal prosthetic left hip joint, subsequent encounter: Secondary | ICD-10-CM

## 2015-07-28 DIAGNOSIS — I471 Supraventricular tachycardia: Secondary | ICD-10-CM

## 2015-07-28 DIAGNOSIS — N182 Chronic kidney disease, stage 2 (mild): Secondary | ICD-10-CM

## 2015-07-28 DIAGNOSIS — D62 Acute posthemorrhagic anemia: Secondary | ICD-10-CM

## 2015-07-28 DIAGNOSIS — Z4789 Encounter for other orthopedic aftercare: Secondary | ICD-10-CM

## 2015-07-28 DIAGNOSIS — D696 Thrombocytopenia, unspecified: Secondary | ICD-10-CM

## 2015-07-28 DIAGNOSIS — Z5189 Encounter for other specified aftercare: Secondary | ICD-10-CM

## 2015-07-28 DIAGNOSIS — W19XXXA Unspecified fall, initial encounter: Secondary | ICD-10-CM

## 2015-07-28 DIAGNOSIS — I10 Essential (primary) hypertension: Secondary | ICD-10-CM

## 2015-07-28 LAB — TYPE AND SCREEN
ABO/RH(D): A NEG
Antibody Screen: NEGATIVE
UNIT DIVISION: 0
UNIT DIVISION: 0
UNIT DIVISION: 0
UNIT DIVISION: 0
UNIT DIVISION: 0
Unit division: 0
Unit division: 0
Unit division: 0

## 2015-07-28 LAB — CBC
HCT: 25.7 % — ABNORMAL LOW (ref 36.0–46.0)
Hemoglobin: 8.3 g/dL — ABNORMAL LOW (ref 12.0–15.0)
MCH: 29.2 pg (ref 26.0–34.0)
MCHC: 32.3 g/dL (ref 30.0–36.0)
MCV: 90.5 fL (ref 78.0–100.0)
Platelets: 129 10*3/uL — ABNORMAL LOW (ref 150–400)
RBC: 2.84 MIL/uL — ABNORMAL LOW (ref 3.87–5.11)
RDW: 15.9 % — AB (ref 11.5–15.5)
WBC: 5.3 10*3/uL (ref 4.0–10.5)

## 2015-07-28 LAB — PREPARE PLATELET PHERESIS: Unit division: 0

## 2015-07-28 NOTE — Clinical Social Work Placement (Signed)
   CLINICAL SOCIAL WORK PLACEMENT  NOTE  Date:  07/28/2015  Patient Details  Name: Rosanne Sackreva N Balthazor MRN: [id number] Date of Birth: 04-04-1921  Clinical Social Work is seeking post-discharge placement for this patient at the Skilled  Nursing Facility level of care (*CSW will initial, date and re-position this form in  chart as items are completed):  Yes   Patient/family provided with [facility] Clinical Social Work Department's list of facilities offering this level of care within the geographic area requested by the patient (or if unable, by the patient's family).  Yes   Patient/family informed of their freedom to choose among providers that offer the needed level of care, that participate in Medicare, Medicaid or managed care program needed by the patient, have an available bed and are willing to accept the patient.  Yes   Patient/family informed of [facility]'s ownership interest [facility] and [facility], as well as of the fact that they are under no obligation to receive care at these facilities.  PASRR submitted to EDS on       PASRR number received on       Existing PASRR number confirmed on 07/28/15     FL2 transmitted to all facilities in geographic area requested by pt/family on 07/28/15     FL2 transmitted to all facilities within larger geographic area on       Patient informed that his/her managed care company has contracts with or will negotiate with certain facilities, including the following:            Patient/family informed of bed offers received.  Patient chooses bed at       Physician recommends and patient chooses bed at      Patient to be transferred to   on  .  Patient to be transferred to facility by       Patient family notified on   of transfer.  Name of family member notified:        PHYSICIAN Please sign FL2     Additional Comment:    _______________________________________________ Rod MaeVaughn, Leonard Hendler S, LCSW 07/28/2015, 4:23  PM

## 2015-07-28 NOTE — Progress Notes (Signed)
[facility] Progress Note  Patient: Pamela [name] NWG:[id number]   PCP: Trinna Post, MD DOB: 09-27-1920   DOA: 07/23/2015   DOS: 07/28/2015   Date of Service: the patient was seen and examined on 07/28/2015  Subjective: Patient denies having any acute complaint. No CP and no SOB. Pain is well controlled. Nutrition: Able to tolerate oral diet Activity: left posterior hip precautions   Assessment and Plan: 1. Femur fracture, left (HCC) -Presented with a mechanical fall.  -status post left total arthroplasty -pain is well controlled -plan is for SNF for rehab at discharge -will follow any further rec's from orthopedic service  -DVT prophylaxis with Aspirin BID and SCD's given bleeding and low platelets -activity: posterior hip precautions, left   2. Postoperative blood loss anemia and ThromboCytopenia. -Patient had significant blood loss post procedure and has received 7 units of PRBC  -1 unit of platelets given on 12/27 -no signs of active bleeding appreciated -lovenox discontinued -started on ferrous sulfate TID -will follow trend -patient denies CP and SOB. -HR in the mid 80's now.  3. Chronic kidney disease. -Currently stable and at baseline  -will monitor renal function trend   4 essential hypertension. -Stable -Continue home antihypertensive regimen. -except for lasix currently; will reassess volume and resume in the next 24-48 hours.   5. SVT. -Patient had brief episode of SVT on telemetry. -Most likely related to pain. -Continue beta blocker  -EKG, electrolytes and troponin WNL  DVT Prophylaxis: mechanical compression device, aspirin   Nutrition: Cardiac diet  Advance goals of care discussion: full code  Brief Summary of Hospitalization:  HPI: As per the H and P dictated on admission, "Pamela [name] is a 81 year old female with a past medical history significant for tachybradycardia syndrome status post pacemaker, previous left hip  replacement, hypertension, hyperlipidemia, coronary artery disease; who presents after sustaining a fall at home. She notes that she had been on her way to bed and had turned around to go to the bathroom and somehow lost balance and fell backwards. She really felt pain in her left leg. She rates pain as a 8 out of 10 on the pain scale. Pain worsened by any movement. She was taken to [facility] where x-rays revealed an oblique fracture extending across the left mid femur, arising at the distal aspect of the femoral stem of the patient's left hip hemiarthroplasty. The ED physician contacted orthopedics who accepted transfer if admitted to the hospitalist service."  Daily update, Procedures: 07/24/2015 underwent complex arthroplasty, was monitored in the ICU 07/25/2015, transferred out of the ICU to [facility] on 07/26/2015.  Consultants: orthopedic, critical care  Antibiotics: Anti-infectives    Start     Dose/Rate Route Frequency Ordered Stop   07/25/15 0230  ceFAZolin (ANCEF) IVPB 2 g/50 mL premix     2 g 100 mL/hr over 30 Minutes Intravenous Every 6 hours 07/24/15 2346 07/25/15 0801   07/24/15 1400  ceFAZolin (ANCEF) IVPB 2 g/50 mL premix  Status:  Discontinued     2 g 100 mL/hr over 30 Minutes Intravenous To Surgery 07/24/15 1359 07/24/15 2346   07/24/15 1346  ceFAZolin (ANCEF) 2-3 GM-% IVPB SOLR    Comments:  Pamela [name]   : cabinet override      07/24/15 1346 07/24/15 2050   07/24/15 1345  ceFAZolin (ANCEF) IVPB 2 g/50 mL premix     2 g 100 mL/hr over 30 Minutes Intravenous  Once 07/24/15 1340 07/24/15 1430  Family Communication: no family was present at bedside, at the time of interview.   Disposition:  Expected discharge date: 07/29/2015 Barriers to safe discharge: stabilization of the blood count    Intake/Output Summary (Last 24 hours) at 07/28/15 1637 Last data filed at 07/28/15 0630  Gross per 24 hour  Intake   1620 ml  Output    200 ml  Net   1420 ml   Filed  Weights   07/23/15 2307  Weight: 68.947 kg (152 lb)    Objective: Physical Exam: Filed Vitals:   07/27/15 2210 07/27/15 2300 07/28/15 0402 07/28/15 1547  BP: 152/50  133/49 154/85  Pulse: 95  81 79  Temp: 100.5 F (38.1 C) 99.5 F (37.5 C) 99.5 F (37.5 C) 98.8 F (37.1 C)  TempSrc: Oral Oral Oral Oral  Resp: 16  16 16   Height:      Weight:      SpO2: 96%  98% 98%    General: Appear in no distress, denies CP and SOB Cardiovascular: S1 and S2 Present, no Murmur, no JVD Respiratory: Bilateral Air entry present and Clear to Auscultation, no Crackles, no wheezes Abdomen: Bowel Sound present, Soft and no tenderness Extremities: no cyanosis; Left hip with clean dressings   Data Reviewed: CBC:  Recent Labs Lab 07/24/15 0105  07/26/15 0420 07/26/15 1213 07/27/15 0625 07/27/15 1530 07/28/15 0642  WBC 5.9  < > 9.1 6.7 5.5 6.8 5.3  NEUTROABS 4.4  --   --   --   --   --   --   HGB 11.5*  < > 8.7* 8.4* 7.3* 9.2* 8.3*  HCT 36.4  < > 26.7* 25.6* 22.6* 27.7* 25.7*  MCV 95.8  < > 89.9 90.8 92.6 91.7 90.5  PLT 143*  < > 84* 78* 81* 118* 129*  < > = values in this interval not displayed. Basic Metabolic Panel:  Recent Labs Lab 07/24/15 0105 07/24/15 2035 07/25/15 0406 07/26/15 0420 07/26/15 1213 07/27/15 0625  NA 144 142 143 142 141 142  K 3.9 4.7 4.8 3.9 3.9 4.1  CL 107  --  112* 111 112* 112*  CO2 29  --  26 25 25 26   GLUCOSE 123* 165* 189* 133* 155* 124*  BUN 29*  --  18 20 18 16   CREATININE 0.87  --  0.86 0.77 0.74 0.68  CALCIUM 9.4  --  7.8* 7.8* 7.7* 7.9*  MG  --   --   --  1.8 1.8  --   PHOS  --   --   --  1.6*  --   --    Liver Function Tests:  Recent Labs Lab 07/24/15 0105  AST 32  ALT 22  ALKPHOS 42  BILITOT 0.6  PROT 6.3*  ALBUMIN 4.2   Cardiac Enzymes:  Recent Labs Lab 07/26/15 1213 07/27/15 0625  TROPONINI 0.08* 0.10*   CBG:  Recent Labs Lab 07/24/15 2238 07/24/15 2349  GLUCAP 174* 165*    Recent Results (from the past 240  hour(s))  MRSA PCR Screening     Status: None   Collection Time: 07/25/15  9:51 AM  Result Value Ref Range Status   MRSA by PCR NEGATIVE NEGATIVE Final    Comment:        The GeneXpert MRSA Assay (FDA approved for NASAL specimens only), is one component of a comprehensive MRSA colonization surveillance program. It is not intended to diagnose MRSA infection nor to guide or monitor treatment for MRSA infections.  Studies: No results found.   Scheduled Meds: . sodium chloride   Intravenous Once  . aspirin EC  325 mg Oral BID  . atorvastatin  10 mg Oral q1800  . carvedilol  3.125 mg Oral q morning - 10a  . ezetimibe  10 mg Oral Daily  . ferrous sulfate  325 mg Oral TID WC  . folic acid  1 mg Oral Daily  . gabapentin  100 mg Oral QHS  . pantoprazole  40 mg Oral Daily  . ramipril  2.5 mg Oral Daily  . vitamin B-12  1,000 mcg Oral Daily   Continuous Infusions:  PRN Meds: acetaminophen **OR** acetaminophen, fentaNYL (SUBLIMAZE) injection, HYDROcodone-acetaminophen, hydroxypropyl methylcellulose / hypromellose, menthol-cetylpyridinium **OR** phenol, morphine injection, nitroGLYCERIN, ondansetron **OR** ondansetron (ZOFRAN) IV  Time spent: 30 minutes  Author: Vassie Loll [facility] Pager: [phone] 07/28/2015 4:37 PM  If 7PM-7AM, please contact night-coverage at [email or web address], password [facility]

## 2015-07-28 NOTE — Clinical Social Work Note (Signed)
Clinical Social Work Assessment  Patient Details  Name: Pamela [name] MRN: [id number] Date of Birth: [birth year]  Date of referral:  07/28/15               Reason for consult:  Facility Placement, Discharge Planning                Permission sought to share information with:  Facility Medical sales representativeContact Representative, Family Supports Permission granted to share information::  Yes, Verbal Permission Granted  Name::        Agency::  [facility]  Relationship::     Contact Information:     Housing/Transportation Living arrangements for the past 2 months:  Single Family Home Source of Information:  Patient Patient Interpreter Needed:  None Criminal Activity/Legal Involvement Pertinent to Current Situation/Hospitalization:  No - Comment as needed Significant Relationships:  Adult Children Lives with:  Self Do you feel safe going back to the place where you live?  No (High fall risk.) Need for family participation in patient care:  No (Coment) (Patient able to make own decisions.)  Care giving concerns:  Patient expressed no concerns at this time.   Social Worker assessment / plan:  CSW received referral for possible SNF placement at time of discharge. CSW spoke with patient regarding discharge plan. Per patient, patient has previously received short-term therapy at [facility] and would prefer to return at time of discharge. Patient reports patient from home alone and still driving short distances [employer]church, grocery store, and [employer] Patient states patient has two sons who live locally and are available to assist patient with needs. CSW to continue to follow and assist with discharge needs.  Employment status:  Retired Health and safety inspectornsurance information:  DealerManaged Medicare (Blue Medicare) PT Recommendations:  Skilled Nursing Facility Information / Referral to community resources:  Skilled Nursing Facility  Patient/Family's Response to care:  Patient understanding and agreeable to CSW plan of  care.  Patient/Family's Understanding of and Emotional Response to Diagnosis, Current Treatment, and Prognosis:  Patient understanding and agreeable to CSW plan of care.  Emotional Assessment Appearance:  Appears stated age Attitude/Demeanor/Rapport:  Other (Pleasant.) Affect (typically observed):  Accepting, Appropriate, Pleasant Orientation:  Oriented to Self, Oriented to Place, Oriented to  Time, Oriented to Situation Alcohol / Substance use:  Not Applicable Psych involvement (Current and /or in the community):  No (Comment) (Not appropriate on this admission.)  Discharge Needs  Concerns to be addressed:  No discharge needs identified Readmission within the last 30 days:  No Current discharge risk:  None Barriers to Discharge:  No Barriers Identified   Rod MaeVaughn, Shalaina Guardiola S, LCSW 07/28/2015, 4:20 PM [phone]

## 2015-07-28 NOTE — [place] FL2 (Signed)
[place] MEDICAID FL2 LEVEL OF CARE SCREENING TOOL     IDENTIFICATION  Patient Name: Pamela [name] Birthdate: [birth year] Sex: female Admission Date (Current Location): 07/23/2015  [facility] and [place] Number:  Producer, television/film/video and Address:  [facility] [place], [address], [place], [place] [address]      Provider Number: [id number]  Attending Physician Name and Address:  Vassie Loll, MD  Relative Name and Phone Number:       Current Level of Care: Hospital Recommended Level of Care: Skilled Nursing Facility Prior Approval Number:    Date Approved/Denied:   PASRR Number: [id number]  Discharge Plan: SNF    Current Diagnoses: Patient Active Problem List   Diagnosis Date Noted  . Postoperative anemia due to acute blood loss 07/27/2015  . SVT (supraventricular tachycardia) (HCC) 07/26/2015  . Femur fracture, left (HCC) 07/24/2015  . Chronic kidney disease 07/24/2015  . Periprosthetic fracture around internal prosthetic left hip joint (HCC) 07/24/2015  . Fall at home   . DVT (deep venous thrombosis) (HCC) 01/30/2014  . Occult blood positive stool 01/30/2014  . Thrombocytopenia, unspecified (HCC) 01/06/2014  . Anemia, iron deficiency 01/06/2014  . Hip fracture, left (HCC) 01/03/2014  . QT prolongation 01/03/2014  . Atherosclerotic cardiovascular disease 08/13/2013  . Hypertensive emergency 04/08/2013  . Dizzy 04/08/2013  . PAT (paroxysmal atrial tachycardia) (HCC) 12/25/2012  . SSS (sick sinus syndrome) with PAT and marked bradycardia with 3 sec pauses  08/30/2012  . Status post placement of cardiac pacemaker. 08/29/12 St. Jude device 08/30/2012  . HTN (hypertension) 08/30/2012  . Hyperlipidemia 08/30/2012  . Osteoporosis, unspecified 04/19/2012  . Essential and other specified forms of tremor 04/19/2012  . Unspecified essential hypertension 04/19/2012  . Cardiac dysrhythmia, unspecified 04/19/2012  . Chronic ischemic heart disease  04/19/2012  . Hereditary and idiopathic peripheral neuropathy 04/19/2012  . Memory loss 04/19/2012  . Disturbance of skin sensation 04/19/2012  . Pain in joint, shoulder region 11/20/2011  . Status post complete repair of rotator cuff 11/20/2011  . Muscle weakness (generalized) 11/20/2011    Orientation RESPIRATION BLADDER Height & Weight    Self, Time, Situation, Place  Normal Continent  (165.1 cm) 152 lbs.  BEHAVIORAL SYMPTOMS/MOOD NEUROLOGICAL BOWEL NUTRITION STATUS   (n/a)  (n/a) Continent Diet (Please see discharge summary.)  AMBULATORY STATUS COMMUNICATION OF NEEDS Skin    (Has not ambulated with PT.) Verbally Surgical wounds                       Personal Care Assistance Level of Assistance  Bathing, Feeding, Dressing Bathing Assistance: Maximum assistance Feeding assistance: Independent Dressing Assistance: Maximum assistance     Functional Limitations Info   (n/a)          SPECIAL CARE FACTORS FREQUENCY  PT (By licensed PT), OT (By licensed OT)     PT Frequency: 5 OT Frequency: 5            Contractures      Additional Factors Info  Code Status, Allergies Code Status Info: FULL Allergies Info: No known allergies.           Current Medications (07/28/2015):  This is the current hospital active medication list Current Facility-Administered Medications  Medication Dose Route Frequency Provider Last Rate Last Dose  . 0.9 %  sodium chloride infusion   Intravenous Once Rolly Salter, MD      . acetaminophen (TYLENOL) tablet 650 mg  650 mg  Oral Q6H PRN Samson FredericBrian Swinteck, MD   650 mg at 07/28/15 1005   Or  . acetaminophen (TYLENOL) suppository 650 mg  650 mg Rectal Q6H PRN Samson FredericBrian Swinteck, MD      . aspirin EC tablet 325 mg  325 mg Oral BID Rolly SalterPranav M Patel, MD   325 mg at 07/28/15 0804  . atorvastatin (LIPITOR) tablet 10 mg  10 mg Oral q1800 Clydie Braunondell A Smith, MD   10 mg at 07/27/15 1722  . carvedilol (COREG) tablet 3.125 mg  3.125 mg Oral q  morning - 10a Clydie Braunondell A Smith, MD   3.125 mg at 07/28/15 0805  . ezetimibe (ZETIA) tablet 10 mg  10 mg Oral Daily Rolly SalterPranav M Patel, MD   10 mg at 07/28/15 0805  . fentaNYL (SUBLIMAZE) injection 25-50 mcg  25-50 mcg Intravenous Q2H PRN Kalman ShanMurali Ramaswamy, MD   25 mcg at 07/25/15 1149  . ferrous sulfate tablet 325 mg  325 mg Oral TID WC Rolly SalterPranav M Patel, MD   325 mg at 07/28/15 1200  . folic acid (FOLVITE) tablet 1 mg  1 mg Oral Daily Rolly SalterPranav M Patel, MD   1 mg at 07/28/15 0805  . gabapentin (NEURONTIN) capsule 100 mg  100 mg Oral QHS Rolly SalterPranav M Patel, MD   100 mg at 07/27/15 2232  . HYDROcodone-acetaminophen (NORCO/VICODIN) 5-325 MG per tablet 1-2 tablet  1-2 tablet Oral Q6H PRN Samson FredericBrian Swinteck, MD   1 tablet at 07/28/15 1514  . hydroxypropyl methylcellulose / hypromellose (ISOPTO TEARS / GONIOVISC) 2.5 % ophthalmic solution 1 drop  1 drop Both Eyes Daily PRN Clydie Braunondell A Smith, MD      . menthol-cetylpyridinium (CEPACOL) lozenge 3 mg  1 lozenge Oral PRN Samson FredericBrian Swinteck, MD       Or  . phenol (CHLORASEPTIC) mouth spray 1 spray  1 spray Mouth/Throat PRN Samson FredericBrian Swinteck, MD      . morphine 2 MG/ML injection 2 mg  2 mg Intravenous Q3H PRN Rolly SalterPranav M Patel, MD      . nitroGLYCERIN (NITROSTAT) SL tablet 0.4 mg  0.4 mg Sublingual Q5 min PRN Clydie Braunondell A Smith, MD      . ondansetron (ZOFRAN) tablet 4 mg  4 mg Oral Q6H PRN Samson FredericBrian Swinteck, MD       Or  . ondansetron (ZOFRAN) injection 4 mg  4 mg Intravenous Q6H PRN Samson FredericBrian Swinteck, MD   4 mg at 07/25/15 1431  . pantoprazole (PROTONIX) EC tablet 40 mg  40 mg Oral Daily Alyson ReedyWesam G Yacoub, MD   40 mg at 07/28/15 1000  . ramipril (ALTACE) capsule 2.5 mg  2.5 mg Oral Daily Rolly SalterPranav M Patel, MD   2.5 mg at 07/28/15 1005  . vitamin B-12 (CYANOCOBALAMIN) tablet 1,000 mcg  1,000 mcg Oral Daily Rolly SalterPranav M Patel, MD   1,000 mcg at 07/28/15 0805     Discharge Medications: Please see discharge summary for a list of discharge medications.  Relevant Imaging Results:  Relevant Lab  Results:   Additional Information SSN: [id number]  Rojelio BrennerVaughn, Alverda Nazzaro S, LCSW [phone]

## 2015-07-28 NOTE — Progress Notes (Signed)
   Subjective:  Patient reports pain as mild to moderate.  No c/o. Received 1 unit PRBCs & plts yesterday  Objective:   VITALS:   Filed Vitals:   07/27/15 1330 07/27/15 2210 07/27/15 2300 07/28/15 0402  BP: 122/48 152/50  133/49  Pulse: 90 95  81  Temp: 98.8 F (37.1 C) 100.5 F (38.1 C) 99.5 F (37.5 C) 99.5 F (37.5 C)  TempSrc: Oral Oral Oral Oral  Resp: 17 16  16   Height:      Weight:      SpO2: 95% 96%  98%    ABD soft Sensation intact distally Intact pulses distally Dorsiflexion/Plantar flexion intact Incision: dressing C/D/I Compartment soft   Lab Results  Component Value Date   WBC 5.3 07/28/2015   HGB 8.3* 07/28/2015   HCT 25.7* 07/28/2015   MCV 90.5 07/28/2015   PLT 129* 07/28/2015   BMET    Component Value Date/Time   NA 142 07/27/2015 0625   K 4.1 07/27/2015 0625   CL 112* 07/27/2015 0625   CO2 26 07/27/2015 0625   GLUCOSE 124* 07/27/2015 0625   BUN 16 07/27/2015 0625   CREATININE 0.68 07/27/2015 0625   CREATININE 0.82 08/13/2013 1201   CALCIUM 7.9* 07/27/2015 0625   GFRNONAA >60 07/27/2015 0625   GFRAA >60 07/27/2015 0625     Assessment/Plan: 4 Days Post-Op   Principal Problem:   Femur fracture, left (HCC) Active Problems:   Status post placement of cardiac pacemaker. 08/29/12 St. Jude device   HTN (hypertension)   Hyperlipidemia   Thrombocytopenia, unspecified (HCC)   Anemia, iron deficiency   Chronic kidney disease   Fall at home   Periprosthetic fracture around internal prosthetic left hip joint (HCC)   SVT (supraventricular tachycardia) (HCC)   Postoperative anemia due to acute blood loss   TDWB RLE with walker Posterior hip precautions ABLA: transfuse for hgb < 7.0 DVT ppx: ASA 325 mg PO BID (per hospitalist), SCDs, TEDs PO pain control PT/OT Dispo: SNF placement   Danuel Felicetti, Cloyde ReamsBrian James 07/28/2015, 8:27 AM   Samson FredericBrian Monque Haggar, MD Cell [phone]

## 2015-07-29 ENCOUNTER — Inpatient Hospital Stay
Admission: RE | Admit: 2015-07-29 | Discharge: 2015-10-07 | Disposition: A | Discharge status: Home / Self Care | Payer: Medicare Other | Source: Ambulatory Visit | Attending: Internal Medicine | Admitting: Internal Medicine

## 2015-07-29 DIAGNOSIS — S72002D Fracture of unspecified part of neck of left femur, subsequent encounter for closed fracture with routine healing: Secondary | ICD-10-CM

## 2015-07-29 DIAGNOSIS — D509 Iron deficiency anemia, unspecified: Secondary | ICD-10-CM

## 2015-07-29 DIAGNOSIS — E785 Hyperlipidemia, unspecified: Secondary | ICD-10-CM

## 2015-07-29 DIAGNOSIS — Z950 Presence of cardiac pacemaker: Secondary | ICD-10-CM

## 2015-07-29 LAB — BASIC METABOLIC PANEL
Anion gap: 7 (ref 5–15)
BUN: 12 mg/dL (ref 6–20)
CHLORIDE: 106 mmol/L (ref 101–111)
CO2: 27 mmol/L (ref 22–32)
Calcium: 8.3 mg/dL — ABNORMAL LOW (ref 8.9–10.3)
Creatinine, Ser: 0.6 mg/dL (ref 0.44–1.00)
GFR calc Af Amer: >60 mL/min (ref >60)
GFR calc non Af Amer: >60 mL/min (ref >60)
GLUCOSE: 103 mg/dL — AB (ref 65–99)
POTASSIUM: 4 mmol/L (ref 3.5–5.1)
Sodium: 140 mmol/L (ref 135–145)

## 2015-07-29 LAB — CBC
HCT: 28.4 % — ABNORMAL LOW (ref 36.0–46.0)
HEMOGLOBIN: 9 g/dL — AB (ref 12.0–15.0)
MCH: 29.3 pg (ref 26.0–34.0)
MCHC: 31.7 g/dL (ref 30.0–36.0)
MCV: 92.5 fL (ref 78.0–100.0)
Platelets: 161 10*3/uL (ref 150–400)
RBC: 3.07 MIL/uL — AB (ref 3.87–5.11)
RDW: 16 % — ABNORMAL HIGH (ref 11.5–15.5)
WBC: 5.8 10*3/uL (ref 4.0–10.5)

## 2015-07-29 MED ORDER — ENOXAPARIN SODIUM 40 MG/0.4ML ~~LOC~~ SOLN: 40.0000 mg | SUBCUTANEOUS | Status: DC

## 2015-07-29 MED ORDER — FERROUS SULFATE 325 (65 FE) MG PO TABS: 325.0000 mg | ORAL_TABLET | Freq: Three times a day (TID) | ORAL | Status: DC

## 2015-07-29 MED ORDER — HYDROCODONE-ACETAMINOPHEN 5-325 MG PO TABS: 1.0000 | ORAL_TABLET | Freq: Four times a day (QID) | ORAL | Status: DC | PRN

## 2015-07-29 MED ORDER — FOLIC ACID 1 MG PO TABS: 1.0000 mg | ORAL_TABLET | Freq: Every day | ORAL | Status: DC

## 2015-07-29 MED ORDER — PANTOPRAZOLE SODIUM 40 MG PO TBEC
40.0000 mg | DELAYED_RELEASE_TABLET | Freq: Every day | ORAL | Status: DC
Start: 2015-07-29 — End: 2016-03-31

## 2015-07-29 NOTE — Progress Notes (Signed)
Physical Therapy Treatment Patient Details Name: Pamela [name] MRN: [id number] DOB: 1920/11/12 Today's Date: 07/29/2015    History of Present Illness 81 y.o. female seen following a fall resulting in Lt femur periprosthetic fracutre s/p Conversion of previous surgery to total hip arthroplasty and Open reduction and internal fixation of left femur fracture.    PT Comments    Patient making slow progress toward mobility goals with max A needed for supine to sit. Pt with increased activity tolerance this session with ability to sit EOB unsupported 10 min before fatigued. Pt led through HEP and tolerated exercises well. Pt recalled 3/3 hip precautions and verbalized understanding of WB status at beginning of session. Continue to progress with anticipated d/c to SNF.  Follow Up Recommendations  SNF (requests [facility] (near her home))     Equipment Recommendations   (TBD next venue of care)    Recommendations for Other Services OT consult     Precautions / Restrictions Precautions Precautions: Posterior Hip (Not specified. need clarification) Precaution Booklet Issued: No Precaution Comments: Verbally reviewed posterior hip precautions Required Braces or Orthoses: Other Brace/Splint Restrictions Weight Bearing Restrictions: Yes LLE Weight Bearing: Partial weight bearing    Mobility  Bed Mobility Overal bed mobility: Needs Assistance Bed Mobility: Supine to Sit;Sit to Sidelying     Supine to sit: Mod assist;Max assist     General bed mobility comments: mod A to bring L LE to EOB; max A for elevating trunk into sitting position; vc for sequencing of supine to sit and hand placement when in sitting position; max A for scooting hips to EOB with pt attempting with use of bilat UE  Transfers                    Ambulation/Gait                 Stairs            Wheelchair Mobility    Modified Rankin (Stroke Patients Only)       Balance Overall  balance assessment: Needs assistance Sitting-balance support: Feet supported;Bilateral upper extremity supported Sitting balance-Leahy Scale: Fair Sitting balance - Comments: pt with ability to static sit EOB for 10 with single and bilat UE support with vc for upright posture and WS to midline                             Cognition Arousal/Alertness: Awake/alert Behavior During Therapy: WFL for tasks assessed/performed Overall Cognitive Status: Within Functional Limits for tasks assessed                      Exercises Total Joint Exercises Ankle Circles/Pumps: AROM;Both;10 reps;Supine Quad Sets: AROM;Left;10 reps;Supine Gluteal Sets: AROM;Both;10 reps;Supine Short Arc Quad: AAROM;Left;10 reps;Supine Heel Slides: AAROM;Left;10 reps;Supine Hip ABduction/ADduction: AAROM;Left;10 reps;Supine Long Arc Quad: AAROM;Left;10 reps;Seated Bridges: AROM;5 reps;Supine (use of R LE; unable to elevate hips off of bed)    General Comments        Pertinent Vitals/Pain Pain Assessment: Faces Faces Pain Scale: Hurts little more (with activity) Pain Location: L lateral thigh Pain Descriptors / Indicators: Sore Pain Intervention(s): Limited activity within patient's tolerance;Monitored during session;Premedicated before session;Repositioned    Home Living                      Prior Function            PT Goals (  current goals can now be found in the care plan section) Acute Rehab PT Goals Patient Stated Goal: Get well PT Goal Formulation: With patient Time For Goal Achievement: 08/08/15 Potential to Achieve Goals: Good Progress towards PT goals: Progressing toward goals    Frequency  Min 3X/week    PT Plan Current plan remains appropriate    Co-evaluation             End of Session Equipment Utilized During Treatment: Gait belt Activity Tolerance: Patient tolerated treatment well Patient left: with call bell/phone within reach;in bed;with SCD's  reapplied (staff present)     Time: 1610-9604 PT Time Calculation (min) (ACUTE ONLY): 36 min  Charges:  $Therapeutic Exercise: 8-22 mins $Therapeutic Activity: 8-22 mins                    G Codes:      Derek Mound, PTA Pager: [phone]   07/29/2015, 10:46 AM

## 2015-07-29 NOTE — Progress Notes (Signed)
   Subjective:  Patient reports pain as mild to moderate.  No c/o.  Objective:   VITALS:   Filed Vitals:   07/28/15 1547 07/28/15 1948 07/29/15 0459 07/29/15 0745  BP: 154/85 149/60 129/62   Pulse: 79 85 58 68  Temp: 98.8 F (37.1 C) 98.3 F (36.8 C) 99.3 F (37.4 C)   TempSrc: Oral Oral Oral   Resp: 16 16 16    Height:      Weight:      SpO2: 98% 96% 95%     ABD soft Sensation intact distally Intact pulses distally Dorsiflexion/Plantar flexion intact Incision: dressing C/D/I Compartment soft   Lab Results  Component Value Date   WBC 5.8 07/29/2015   HGB 9.0* 07/29/2015   HCT 28.4* 07/29/2015   MCV 92.5 07/29/2015   PLT 161 07/29/2015   BMET    Component Value Date/Time   NA 140 07/29/2015 0435   K 4.0 07/29/2015 0435   CL 106 07/29/2015 0435   CO2 27 07/29/2015 0435   GLUCOSE 103* 07/29/2015 0435   BUN 12 07/29/2015 0435   CREATININE 0.60 07/29/2015 0435   CREATININE 0.82 08/13/2013 1201   CALCIUM 8.3* 07/29/2015 0435   GFRNONAA >60 07/29/2015 0435   GFRAA >60 07/29/2015 0435     Assessment/Plan: 5 Days Post-Op   Principal Problem:   Femur fracture, left (HCC) Active Problems:   Status post placement of cardiac pacemaker. 08/29/12 St. Jude device   HTN (hypertension)   Hyperlipidemia   Thrombocytopenia, unspecified (HCC)   Anemia, iron deficiency   Chronic kidney disease   Fall at home   Periprosthetic fracture around internal prosthetic left hip joint (HCC)   SVT (supraventricular tachycardia) (HCC)   Postoperative anemia due to acute blood loss   TDWB RLE with walker Posterior hip precautions ABLA: transfuse for hgb < 7.0 DVT ppx: ASA 325 mg PO BID (per hospitalist), SCDs, TEDs PO pain control PT/OT Dispo: SNF placement   Khelani Kops, Cloyde ReamsBrian James 07/29/2015, 7:57 AM   Samson FredericBrian Kendry Pfarr, MD Cell [phone]

## 2015-07-29 NOTE — Discharge Summary (Signed)
Physician Discharge Summary  Pamela [name] ZOX:[id number] DOB: Oct 07, 1920 DOA: 07/23/2015  PCP: Trinna Post, MD  Admit date: 07/23/2015 Discharge date: 07/29/2015  Time spent: 35 minutes  Recommendations for Outpatient Follow-up:  Repeat CBC in 5 days to follow Hgb and platelets trend Repeat BMET in 5 days to follow electrolytes and renal function  Follow up with Dr. Linna Caprice in 2 weeks  Activity with Left posterior hip precautions   Discharge Diagnoses:  Principal Problem:   Femur fracture, left (HCC) Active Problems:   Status post placement of cardiac pacemaker. 08/29/12 [employer] device   HTN (hypertension)   Hyperlipidemia   Thrombocytopenia, unspecified (HCC)   Anemia, iron deficiency   Chronic kidney disease   Fall at home   Periprosthetic fracture around internal prosthetic left hip joint (HCC)   SVT (supraventricular tachycardia) (HCC)   Postoperative anemia due to acute blood loss   Discharge Condition: stable and improved. Will discharge to SNF for further care and rehabilitation.  Diet recommendation: heart healthy diet   Filed Weights   07/23/15 2307  Weight: 68.947 kg (152 lb)    History of present illness:  81 year old female with a past medical history significant for tachybradycardia syndrome status post pacemaker, previous left hip replacement, hypertension, hyperlipidemia, coronary artery disease; who presents after sustaining a fall at home. She notes that she had been on her way to bed and had turned around to go to the bathroom and somehow lost balance and fell backwards. She really felt pain in her left leg. She rates pain as a 8 out of 10 on the pain scale. Pain worsened by any movement. She was taken to [facility] where x-rays revealed an oblique fracture extending across the left mid femur, arising at the distal aspect of the femoral stem of the patient's left hip hemiarthroplasty.   Hospital Course:  1. Femur fracture, left  (HCC) -Presented with mechanical fall and periprosthetic fracture  -status post left total arthroplasty -pain is well controlled -will follow up in 2 weeks with orthopedic service as an outpatient  -DVT prophylaxis with lovenox now; initially on aspirin BID due to low platelets and anemia -activity: posterior hip precautions, left   2. Postoperative blood loss anemia and ThromboCytopenia. -Patient had significant blood loss post procedure and has received 7 units of PRBC  -1 unit of platelets given on 12/27 -no signs of active bleeding appreciated -lovenox is now safe to be resumed  -started on ferrous sulfate TID -will need CBC in 5 days to follow trend -patient denies CP and SOB.  3. Chronic kidney disease: stage 2-3 at baseline  -Currently stable and at baseline  -will need outpatient monitoring of her renal function trend   4 essential hypertension. -Stable and well controlled  -Continue home antihypertensive regimen. -advise to follow low sodium diet   5. SVT. -Patient had brief episode of SVT on telemetry; Most likely related to pain and anemia. -Continue home beta blocker regimen  -EKG, electrolytes and troponin WNL -no further episodes appreciated   6. GERD -will continue PPI  Procedures:  07/24/2015 underwent complex arthroplasty  Consultations:  Orthopedic service   PCCM  Discharge Exam: Filed Vitals:   07/29/15 0459 07/29/15 0745  BP: 129/62   Pulse: 58 68  Temp: 99.3 F (37.4 C)   Resp: 16    General: Appear in no distress, denies CP and SOB Cardiovascular: S1 and S2 Present, no Murmur, no JVD Respiratory: Bilateral Air entry present and Clear to  Auscultation, no Crackles, no wheezes Abdomen: Bowel Sound present, Soft and no tenderness Extremities: no cyanosis; Left hip with clean dressings    Discharge Instructions   Discharge Instructions    Diet - low sodium heart healthy    Complete by:  As directed      Discharge instructions     Complete by:  As directed   Take medications as prescribed Follow up with orthopedic service as instructed Repeat CBC in 5 days to follow Hgb and Platelets trend Please repeat BMET in 5 days to follow electrolytes and renal function Physical therapy as per SNF protocol Left posterior hip precautions Heart healthy diet          Current Discharge Medication List    START taking these medications   Details  enoxaparin (LOVENOX) 40 MG/0.4ML injection Inject 0.4 mLs (40 mg total) into the skin daily. Qty: 0 Syringe    folic acid (FOLVITE) 1 MG tablet Take 1 tablet (1 mg total) by mouth daily.    HYDROcodone-acetaminophen (NORCO/VICODIN) 5-325 MG tablet Take 1 tablet by mouth every 6 (six) hours as needed for severe pain. Qty: 30 tablet, Refills: 0    pantoprazole (PROTONIX) 40 MG tablet Take 1 tablet (40 mg total) by mouth daily.      CONTINUE these medications which have CHANGED   Details  ferrous sulfate 325 (65 FE) MG tablet Take 1 tablet (325 mg total) by mouth 3 (three) times daily with meals. Refills: 3      CONTINUE these medications which have NOT CHANGED   Details  acetaminophen (TYLENOL) 500 MG tablet Take 500 mg by mouth every 6 (six) hours as needed for mild pain or moderate pain.     aspirin EC 81 MG tablet Take 81 mg by mouth every morning.     atorvastatin (LIPITOR) 10 MG tablet TAKE ONE (1) TABLET BY MOUTH EVERY DAY Qty: 30 tablet, Refills: 11    carvedilol (COREG) 3.125 MG tablet Take 1 tablet (3.125 mg total) by mouth 2 (two) times daily. Qty: 180 tablet, Refills: 3   Associated Diagnoses: Essential hypertension    Cholecalciferol (VITAMIN D-3 PO) Take 1 capsule by mouth daily.    ezetimibe (ZETIA) 10 MG tablet Take 10 mg by mouth daily.    furosemide (LASIX) 20 MG tablet TAKE ONE TABLET BY MOUTH DAILY AS NEEDEDFOR EDEMA Qty: 30 tablet, Refills: 2    gabapentin (NEURONTIN) 100 MG capsule Take 100 mg by mouth at bedtime.    l-methylfolate-B6-B12  (METANX) 3-35-2 MG TABS TAKE ONE TABLET TWICE DAILY Qty: 60 tablet, Refills: 3    Multiple Vitamins-Minerals (OCUVITE PO) Take 1 tablet by mouth 2 (two) times daily.    nitroGLYCERIN (NITROSTAT) 0.4 MG SL tablet Place 1 tablet (0.4 mg total) under the tongue every 5 (five) minutes as needed for chest pain. Qty: 100 tablet, Refills: 3    Polyethyl Glycol-Propyl Glycol (SYSTANE OP) Apply 1 drop to eye daily as needed (Dry Eyes).     ramipril (ALTACE) 2.5 MG capsule Take 1 capsule (2.5 mg total) by mouth daily. Qty: 30 capsule, Refills: 6    vitamin B-12 (CYANOCOBALAMIN) 1000 MCG tablet Take 1,000 mcg by mouth daily.      STOP taking these medications     Ibuprofen-Diphenhydramine HCl (ADVIL PM) 200-25 MG CAPS        No Known Allergies Follow-up Information    Follow up with Swinteck, Cloyde Reams, MD. Schedule an appointment as soon as possible for a visit  in 2 weeks.   Specialty:  Orthopedic Surgery   Contact information:   [address] [place] [address] [phone]       The results of significant diagnostics from this hospitalization (including imaging, microbiology, ancillary and laboratory) are listed below for reference.    Significant Diagnostic Studies: Dg Chest 1 View  07/24/2015  CLINICAL DATA:  81 year old female with fall and left femur pain EXAM: CHEST 1 VIEW COMPARISON:  Chest radiograph dated 01/03/2014 FINDINGS: Single-view of the chest does not demonstrate a focal consolidation. There is no pleural effusion or pneumothorax. Stable cardiac silhouette. Left pectoral pacemaker device. Lower thoracic vertebroplasty changes noted. IMPRESSION: No active disease. Electronically Signed   By: Elgie CollardArash  Radparvar M.D.   On: 07/24/2015 00:14   Pelvis Portable  07/24/2015  CLINICAL DATA:  81 year old female status post left femoral fracture with internal fixation. EXAM: PORTABLE PELVIS 1-2 VIEWS COMPARISON:  Intraoperative fluoroscopic radiograph dated  07/24/2015 FINDINGS: There is a total left hip arthroplasty with partial visualization of the femoral component. No acute fracture or dislocation identified. There is degenerative changes of the lower lumbar spine. Cutaneous surgical clips noted over the left hip. IMPRESSION: Left hip arthroplasty with partial visualization of the femoral component. No acute fracture or dislocation. Electronically Signed   By: Elgie CollardArash  Radparvar M.D.   On: 07/24/2015 22:24   Dg C-arm Gt 120 Min  07/24/2015  CLINICAL DATA:  Periprosthetic proximal left femur fracture.  ORIF. EXAM: DG C-ARM GT 120 MIN; LEFT FEMUR 2 VIEWS FLUOROSCOPY TIME:  C-arm fluoroscopic images were obtained intraoperatively and submitted for post operative interpretation. Please see the performing provider's procedural report for the fluoroscopy time utilized. COMPARISON:  Radiographs 07/23/2015. FINDINGS: Seven spot fluoroscopic images are submitted. Patient has undergone revision of the left hip hemiarthroplasty with performance of a total hip arthroplasty. There is a screw fixed acetabular component which appears well positioned. There is a long segment lateral femoral plate and screws with multiple cerclage wires traversing the fracture of the proximal femoral diaphysis. The fracture demonstrates near anatomic reduction. No complications identified. IMPRESSION: Near anatomic reduction of proximal femur fracture status post revision of arthroplasty, lateral plate, screw and cerclage wire fixation. No demonstrated complication. Electronically Signed   By: Carey BullocksWilliam  Veazey M.D.   On: 07/24/2015 20:14   Dg Femur Min 2 Views Left  07/24/2015  CLINICAL DATA:  Periprosthetic proximal left femur fracture.  ORIF. EXAM: DG C-ARM GT 120 MIN; LEFT FEMUR 2 VIEWS FLUOROSCOPY TIME:  C-arm fluoroscopic images were obtained intraoperatively and submitted for post operative interpretation. Please see the performing provider's procedural report for the fluoroscopy time  utilized. COMPARISON:  Radiographs 07/23/2015. FINDINGS: Seven spot fluoroscopic images are submitted. Patient has undergone revision of the left hip hemiarthroplasty with performance of a total hip arthroplasty. There is a screw fixed acetabular component which appears well positioned. There is a long segment lateral femoral plate and screws with multiple cerclage wires traversing the fracture of the proximal femoral diaphysis. The fracture demonstrates near anatomic reduction. No complications identified. IMPRESSION: Near anatomic reduction of proximal femur fracture status post revision of arthroplasty, lateral plate, screw and cerclage wire fixation. No demonstrated complication. Electronically Signed   By: Carey BullocksWilliam  Veazey M.D.   On: 07/24/2015 20:14   Dg Femur Min 2 Views Left  07/24/2015  CLINICAL DATA:  Status post fall backwards at home, landing on left side, with left femur pain. Initial encounter. EXAM: LEFT FEMUR 2 VIEWS COMPARISON:  None. FINDINGS: There  is an oblique fracture extending across the left mid femur, arising at the distal aspect of the femoral stem of the patient's left hip hemiarthroplasty. There is up to 1 cm of anterior and distal displacement, without significant angulation. No additional fractures are seen. Diffuse sclerotic change is noted at the left knee. The soft tissues are difficult to fully assess on radiograph. IMPRESSION: Oblique fracture extending across the left mid femur, arising at the distal aspect of the femoral stem of the patient's left hip hemiarthroplasty. Up to 1 cm of anterior and distal displacement noted, without significant angulation. Electronically Signed   By: Roanna Raider M.D.   On: 07/24/2015 00:16   Dg Femur Port Min 2 Views Left  07/24/2015  CLINICAL DATA:  Postoperative radiograph for left femur fracture. Initial encounter. EXAM: LEFT FEMUR PORTABLE 2 VIEWS COMPARISON:  Left femur radiographs performed 07/23/2015 FINDINGS: There has been  interval placement of a plate and screws along the left femur, transfixing the acute fracture in grossly anatomic alignment. Associated new cerclage wires are also seen. The patient's left hip hemiarthroplasty has also been replaced with a left hip total arthroplasty. Surrounding postoperative change is seen. Overlying skin staples are noted. IMPRESSION: Interval placement of a plate and screws along much of the left femur, transfixing acute fracture in grossly anatomic alignment. Associated new cerclage wires noted. Left hip hemiarthroplasty has also been replaced with left hip total arthroplasty. Electronically Signed   By: Roanna Raider M.D.   On: 07/24/2015 22:19    Microbiology: Recent Results (from the past 240 hour(s))  MRSA PCR Screening     Status: None   Collection Time: 07/25/15  9:51 AM  Result Value Ref Range Status   MRSA by PCR NEGATIVE NEGATIVE Final    Comment:        The GeneXpert MRSA Assay (FDA approved for NASAL specimens only), is one component of a comprehensive MRSA colonization surveillance program. It is not intended to diagnose MRSA infection nor to guide or monitor treatment for MRSA infections.      Labs: Basic Metabolic Panel:  Recent Labs Lab 07/25/15 0406 07/26/15 0420 07/26/15 1213 07/27/15 0625 07/29/15 0435  NA 143 142 141 142 140  K 4.8 3.9 3.9 4.1 4.0  CL 112* 111 112* 112* 106  CO2 GLUCOSE 189* 133* 155* 124* 103*  BUN CREATININE 0.86 0.77 0.74 0.68 0.60  CALCIUM 7.8* 7.8* 7.7* 7.9* 8.3*  MG  --  1.8 1.8  --   --   PHOS  --  1.6*  --   --   --    Liver Function Tests:  Recent Labs Lab 07/24/15 0105  AST 32  ALT 22  ALKPHOS 42  BILITOT 0.6  PROT 6.3*  ALBUMIN 4.2   CBC:  Recent Labs Lab 07/24/15 0105  07/26/15 1213 07/27/15 0625 07/27/15 1530 07/28/15 0642 07/29/15 0435  WBC 5.9  < > 6.7 5.5 6.8 5.3 5.8  NEUTROABS 4.4  --   --   --   --   --   --   HGB 11.5*  < > 8.4* 7.3* 9.2*  8.3* 9.0*  HCT 36.4  < > 25.6* 22.6* 27.7* 25.7* 28.4*  MCV 95.8  < > 90.8 92.6 91.7 90.5 92.5  PLT 143*  < > 78* 81* 118* 129* 161  < > = values in this interval not displayed. Cardiac Enzymes:  Recent Labs Lab 07/26/15 1213  07/27/15 0625  TROPONINI 0.08* 0.10*   CBG:  Recent Labs Lab 07/24/15 2238 07/24/15 2349  GLUCAP 174* 165*    Signed:  Vassie Loll MD   [facility] 07/29/2015, 12:14 PM

## 2015-07-29 NOTE — Clinical Social Work Placement (Signed)
   CLINICAL SOCIAL WORK PLACEMENT  NOTE  Date:  07/29/2015  Patient Details  Name: Pamela [name] N Hansley MRN: [id number] Date of Birth: Aug 14, 1920  Clinical Social Work is seeking post-discharge placement for this patient at the Skilled  Nursing Facility level of care (*CSW will initial, date and re-position this form in  chart as items are completed):  Yes   Patient/family provided with [facility] Clinical Social Work Department's list of facilities offering this level of care within the geographic area requested by the patient (or if unable, by the patient's family).  Yes   Patient/family informed of their freedom to choose among providers that offer the needed level of care, that participate in Medicare, Medicaid or managed care program needed by the patient, have an available bed and are willing to accept the patient.  Yes   Patient/family informed of [facility] ownership interest [facility] and [facility], as well as of the fact that they are under no obligation to receive care at these facilities.  PASRR submitted to EDS on       PASRR number received on       Existing PASRR number confirmed on 07/28/15     FL2 transmitted to all facilities in geographic area requested by pt/family on 07/28/15     FL2 transmitted to all facilities within larger geographic area on       Patient informed that his/her managed care company has contracts with or will negotiate with certain facilities, including the following:        Yes   Patient/family informed of bed offers received.  [facility]     Physician recommends and patient chooses bed at      [facility] on 07/29/15.  Patient to be transferred to facility by [facility]     Patient family notified on 07/29/15 of transfer.  Name of family member notified:  Patient     PHYSICIAN Please sign FL2     Additional Comment:     _______________________________________________ Rod MaeVaughn, Stephfon Bovey S, LCSW 07/29/2015, 1:06 PM

## 2015-07-29 NOTE — Care Management Note (Signed)
Case Management Note  Patient Details  Name: Rosanne Sackreva N Poupard MRN: [id number] Date of Birth: 08-14-1920  Subjective/Objective:        Admitted with left hip fracture, s/p left hip hemiarthroplasty            Action/Plan: PT recommended SNF, referral made to CSW. CSW working on placement at [facility] for [facility].  Expected Discharge Date:                  Expected Discharge Plan:  Skilled Nursing Facility  In-House Referral:  Clinical Social Work  Discharge planning Services  CM Consult  Post Acute Care Choice:  NA Choice offered to:     DME Arranged:    DME Agency:     HH Arranged:    HH Agency:     Status of Service:  Completed, signed off  Medicare Important Message Given:  Yes Date Medicare IM Given:    Medicare IM give by:    Date Additional Medicare IM Given:    Additional Medicare Important Message give by:     If discussed at Long Length of Stay Meetings, dates discussed:    Additional Comments:  Monica BectonKrieg, Malgorzata Albert Watson, RN 07/29/2015, 12:11 PM

## 2015-07-29 NOTE — Progress Notes (Signed)
Report called to RN at [facility].  

## 2015-07-29 NOTE — Discharge Planning (Signed)
Patient to be discharged [facility]. Patient updated regarding discharge.  [facility]: [id number] Next review date: 08-02-2015 RUG level: RVB  [facility]ility[facility] RN report number: [phone] Transportation: [facility] (scheduled for 2pm when bed available)  Marcelline Deistmily Florencio Hollibaugh, LCSW [phone] Orthopedics: [phone] Surgical: [phone]

## 2015-07-30 ENCOUNTER — Encounter (HOSPITAL_COMMUNITY)
Admission: AD | Admit: 2015-07-30 | Discharge: 2015-07-30 | Disposition: A | Discharge status: Home / Self Care | Payer: Medicare Other | Source: Skilled Nursing Facility | Attending: Internal Medicine | Admitting: Internal Medicine

## 2015-07-30 ENCOUNTER — Non-Acute Institutional Stay (SKILLED_NURSING_FACILITY): Payer: Medicare Other | Admitting: Internal Medicine

## 2015-07-30 ENCOUNTER — Encounter (HOSPITAL_COMMUNITY): Payer: Self-pay | Admitting: Orthopedic Surgery

## 2015-07-30 DIAGNOSIS — I10 Essential (primary) hypertension: Secondary | ICD-10-CM | POA: Diagnosis not present

## 2015-07-30 DIAGNOSIS — D509 Iron deficiency anemia, unspecified: Secondary | ICD-10-CM | POA: Diagnosis not present

## 2015-07-30 DIAGNOSIS — N189 Chronic kidney disease, unspecified: Secondary | ICD-10-CM | POA: Diagnosis not present

## 2015-07-30 DIAGNOSIS — S72002D Fracture of unspecified part of neck of left femur, subsequent encounter for closed fracture with routine healing: Secondary | ICD-10-CM

## 2015-07-30 LAB — BASIC METABOLIC PANEL
Anion gap: 5 (ref 5–15)
BUN: 15 mg/dL (ref 6–20)
CALCIUM: 8.2 mg/dL — AB (ref 8.9–10.3)
CO2: 26 mmol/L (ref 22–32)
CREATININE: 0.64 mg/dL (ref 0.44–1.00)
Chloride: 110 mmol/L (ref 101–111)
GFR calc non Af Amer: >60 mL/min (ref >60)
Glucose, Bld: 118 mg/dL — ABNORMAL HIGH (ref 65–99)
Potassium: 3.7 mmol/L (ref 3.5–5.1)
SODIUM: 141 mmol/L (ref 135–145)

## 2015-07-30 LAB — CBC
HCT: 27.7 % — ABNORMAL LOW (ref 36.0–46.0)
Hemoglobin: 9.1 g/dL — ABNORMAL LOW (ref 12.0–15.0)
MCH: 30.4 pg (ref 26.0–34.0)
MCHC: 32.9 g/dL (ref 30.0–36.0)
MCV: 92.6 fL (ref 78.0–100.0)
PLATELETS: 194 10*3/uL (ref 150–400)
RBC: 2.99 MIL/uL — AB (ref 3.87–5.11)
RDW: 15.8 % — AB (ref 11.5–15.5)
WBC: 7.5 10*3/uL (ref 4.0–10.5)

## 2015-07-30 NOTE — Progress Notes (Signed)
Patient ID: Pamela [name], female   DOB: 1920/10/30, 81 y.o.   MRN: [id number]     Facility; [facility] This is an acute visit.    Chief complaint--acute visit secondary to hospitalization for left femur periprosthetic fracture secondary to fall  History of present illness.  Patient is a pleasant 81 year old female who actually was here back in June 2015 after repair for a previous left hip fracture.  In this instance she presented to the ER after falling out home-she stated she somehow lost her balance and fell backwards.  She was found to have a periprosthetic fracture of the left femur--this was surgically repaired.  She is on DVT prophylaxis with Lovenox initiated been on aspirin twice a day because of low platelets and anemia.  Do note when she was here previously back in June 2015 she was diagnosed with a DVT and did complete a course apparently of Xarelto in the inter.  She had postop blood loss and anemia thrombocytopenia.  She required 7 units of packed red blood cells and 1 unit of platelets.  It was no sign of active bleeding it was thought her Lovenox was safe to be resumed area  She's also been started on iron 3 times a day.  Hemoglobin appears stable at 9.1 as of today platelets of 1 94,000.  Patient also has a history of chronic kidney disease stage II-III-this appears stable her creatinine actually 0.64 BUN 15 today.  Her blood pressure apparently was well controlled in the hospital she was advised to follow a low-sodium diet-she does continue on Coreg 3.125 mg twice a day as well as Altace 2.5 mg daily.  Blood pressure today was 106/52.  Patient did have an episode of SVT on telemetry during her hospitalization this was thought secondary to the pain and anemia recommendation continue home beta blocker regimen EKG electrolytes and troponin were within normal limits.  She does have a history of pacemaker placement in January 2014 as well as an ICD apparently in  place with a history of sick sinus syndrome in the past with marked bradycardia.  Previous medical history.  Femur fracture on the left surgically repaired.        Past Medical History   Diagnosis  Date   .  Pacemaker  08/29/2012     st jude accent DR RF device, model number [id number], serial number [id number], implanted 08/29/2012 for sinus bradycardia, runs of supraventricular tachycardia last checked 10/23/2012   .  ICD (implantable cardiac defibrillator) in place    .  SSS (sick sinus syndrome) with PAT and marked bradycardia with 3 sec pauses  08/30/2012   .  Status post placement of cardiac pacemaker. 08/29/12 St. Jude device  08/30/2012   .  HTN (hypertension)  08/30/2012   .  Hyperlipidemia  08/30/2012   .  Polyneuropathy in other diseases classified elsewhere    .  Macular degeneration    .  Coronary artery disease      stent in the LAD artery in 2002   .  Venous insufficiency     Past Surgical History   Procedure  Laterality  Date   .  Cardiac catheterization   2006     3 stents placed   .  Insert / replace / remove pacemaker   08/29/2012     st jude   .  Rotator cuff repair  Left    .  Abdominal hysterectomy     .  Cesarean section     .  Kidney stone surgery     .  Rotator cuff repair  Right    .  Cardiac catheterization   06/2001     placement of BiodivYsio 2.5.10mm stent dilated to 2.3mm in proximal left anterior descenting stenotic lesion   .  Radiofrequency ablation   08/25/2010     ablation of left greater saphenous vein   .  Hip arthroplasty  Left  01/04/2014     Procedure: ARTHROPLASTY BIPOLAR HIP; Surgeon: Shelda Pal, MD; Location: [facility] OR; Service: Orthopedics; Laterality: Left;     Medications reviewed per [facility]  Lovenox 40 mg injection daily.  Folic acid 1 mg daily.  Norco 5-3 25 mg-one tablet every 6 hours when necessary severe pain.  Protonix 40 mg daily.  Ferrous sulfate 325 mg 3 times a day.  Tylenol 500 mg every 6 hours when necessary mild or  moderate pain.  Aspirin 81 mg daily.  Lipitor 10 mg daily.  Coreg 3.125 mg twice a day.  Vitamin D 1 capsule daily.  Zetia 10 mg daily.  Lasix 20 mg daily when necessary.  Neurontin 100 mg daily at bedtime.  Metanx-daily.  Multivitamin daily.  Nitroglycerin when necessary.  Systane eyedrop 1 drop to eyes when necessary daily area  Altace 2.5 mg daily.  Vitamin B12 1000 g daily.                                                            Marland Kitchen     0   .      0   .       .       .       .          Social;  Patient lives at home--apparently does have assistance at times   reports that she has never smoked. She does not have any smokeless tobacco history on file. She reports that she does not drink alcohol or use illicit drugs .  Fam HX; indicated that her mother is deceased. She indicated that her father is deceased. She indicated that only one of her two sisters are alive. She indicated that her brother is deceased .  Review of systems In general no complaints of fever or chills.  Head ears eyes nose mouth and throat does not complaining of any visual changes or nasal discharge.  Skin does not complaining of any itching or rashes surgical site left hip is covered  Respiratory no shortness of breath or cough Cardiac no chest pain --minimal low ext edema GI no abdominal pain or constipation  GU no voiding difficulties.  Musculoskeletal  At this point hip pain appears to be controlled -she is receiving Norco w Neurologic does not complaining of any headache or dizziness.  Psych is not complaining of any anxiety or depression   Physical examination  Physical exam.   Temperature 97.8 pulse 83 respirations 18 blood pressure while 106/52 Gen.  well looking 81 year old woman bright and alert  Her skin is warm and dry surgical site left hip is currently covered per surgery does not complain of pain Eyes pupils appear reactive to light her visual acuity  is grossly intact Oropharynx is clear mucous membranes moist  Respiratory clear entry bilaterally --no labored breathing Cardiac heart sounds are normal  there are no murmurs gallops or bruits-- She has minimal lower extremity edema pedal pulses are somewhat reduced bilaterally Abdomen -soft--is nontender positive bowel sounds    Musculoskeletal l  I did not note any deformities is able to move all extremities limited left leg secondary to the surgery again she has minimal lower extremity edema.  Neurologic is grossly intact her speech is clear no lateralizing findings.  Psych she is alert and oriented continues to be pleasant and appropriate  Neurologic is grossly intact her speech is clear no lateralizing findings   Psych-she is alert and oriented pleasant and appropriate.  Labs.  07/30/2015.  WBC 7.5 hemoglobin 9.1 platelets 194.  Sodium 141 potassium 3.7 BUN 15 creatinine 0.64    Impression/plan    Left hip periprostatic femur fracture-with history of previous left hip fracture-again this was surgically repaired appears to have tolerated procedure well she will need orthopedic follow-up.  She has DVT prophylaxis with Lovenox had been on aspirin twice a day because of low platelets and anemia which appears to be improving she is status post transfusion.   will need PT and OT  History left leg DVT- Again this is well over a year ago she is now on Lovenox for DVT prophylaxis status post surgery apparently this was stable as an out patient in the interim.  History of chronic kidney disease this appears stable with a creatinine of 0.64 on lab done today Will update this next week.       Thrombocytopenia; . She does appear to have a history of this has receive a platelet transfusion this appears stable with platelets of 194,000 today this will need updating on Monday, January 2   Status post pacemaker implant in January 2014 this is stable-she did have a brief episode  of SVT on telemetry during her hospitalization-EKG electrolytes and troponin were within normal limits. Likely related to pain and anemia-she is on a beta blocker   Hypertension -- At this point will monitor blood pressure today 106/52-she is on Coreg low dose as well as Altase low-dose  History of chronic ischemic heart disease  She does continue on a statin as well as anticoagulation currently with Lovenox   Blood loss anemia on iron--as well as a proton pump inhibitor- Hemoglobin stable at 9.1 and she did received 7 units of packed red blood cells in the hospital as well as platelets  [id number]-of note greater than 40 minutes spent assessing patient-reviewing her chart-and coordinating and formulating a plan of care for numerous diagnoses-of note greater than 50% of time spent coordinating plan of care

## 2015-08-02 ENCOUNTER — Non-Acute Institutional Stay (SKILLED_NURSING_FACILITY): Payer: Medicare Other | Admitting: Internal Medicine

## 2015-08-02 ENCOUNTER — Encounter (HOSPITAL_COMMUNITY)
Admission: RE | Admit: 2015-08-02 | Discharge: 2015-08-02 | Disposition: A | Discharge status: Home / Self Care | Payer: Medicare Other | Source: Skilled Nursing Facility | Attending: Internal Medicine | Admitting: Internal Medicine

## 2015-08-02 DIAGNOSIS — D509 Iron deficiency anemia, unspecified: Secondary | ICD-10-CM | POA: Diagnosis not present

## 2015-08-02 DIAGNOSIS — I2510 Atherosclerotic heart disease of native coronary artery without angina pectoris: Secondary | ICD-10-CM | POA: Diagnosis not present

## 2015-08-02 DIAGNOSIS — D696 Thrombocytopenia, unspecified: Secondary | ICD-10-CM | POA: Diagnosis not present

## 2015-08-02 DIAGNOSIS — T84041D Periprosthetic fracture around internal prosthetic left hip joint, subsequent encounter: Secondary | ICD-10-CM

## 2015-08-02 DIAGNOSIS — D62 Acute posthemorrhagic anemia: Secondary | ICD-10-CM | POA: Insufficient documentation

## 2015-08-02 LAB — BASIC METABOLIC PANEL
ANION GAP: 7 (ref 5–15)
BUN: 17 mg/dL (ref 6–20)
CALCIUM: 8.5 mg/dL — AB (ref 8.9–10.3)
CO2: 27 mmol/L (ref 22–32)
Chloride: 108 mmol/L (ref 101–111)
Creatinine, Ser: 0.68 mg/dL (ref 0.44–1.00)
Glucose, Bld: 110 mg/dL — ABNORMAL HIGH (ref 65–99)
POTASSIUM: 3.8 mmol/L (ref 3.5–5.1)
SODIUM: 142 mmol/L (ref 135–145)

## 2015-08-02 LAB — CBC WITH DIFFERENTIAL/PLATELET
BASOS ABS: 0 10*3/uL (ref 0.0–0.1)
BASOS PCT: 0 %
Eosinophils Absolute: 0.2 10*3/uL (ref 0.0–0.7)
Eosinophils Relative: 3 %
HEMATOCRIT: 27.9 % — AB (ref 36.0–46.0)
HEMOGLOBIN: 8.9 g/dL — AB (ref 12.0–15.0)
Lymphocytes Relative: 24 %
Lymphs Abs: 1.6 10*3/uL (ref 0.7–4.0)
MCH: 30.5 pg (ref 26.0–34.0)
MCHC: 31.9 g/dL (ref 30.0–36.0)
MCV: 95.5 fL (ref 78.0–100.0)
MONO ABS: 1 10*3/uL (ref 0.1–1.0)
Monocytes Relative: 15 %
NEUTROS ABS: 3.8 10*3/uL (ref 1.7–7.7)
NEUTROS PCT: 58 %
Platelets: 304 10*3/uL (ref 150–400)
RBC: 2.92 MIL/uL — AB (ref 3.87–5.11)
RDW: 15.8 % — AB (ref 11.5–15.5)
WBC: 6.5 10*3/uL (ref 4.0–10.5)

## 2015-08-03 ENCOUNTER — Encounter: Payer: Medicare Other | Admitting: *Deleted

## 2015-08-03 ENCOUNTER — Telehealth: Payer: Self-pay | Admitting: Cardiology

## 2015-08-03 NOTE — Progress Notes (Addendum)
Patient ID: Pamela [name], female   DOB: 10/21/1920, 82 y.o.   MRN: [id number]                HISTORY & PHYSICAL  DATE:  08/02/2015        FACILITY: [facility]                 LEVEL OF CARE:   SNF   CHIEF COMPLAINT:  Admission to SNF, post stay in hospital with a left periprosthetic femur fracture.    HISTORY OF PRESENT ILLNESS:  This is a patient whom we know from a stay here in the summer of 2015, at which time she had a left total hip arthroplasty.    She tells me she was getting out of her bed on her way to the bathroom, which is very close by.  She turned to go into the bathroom and ended up on the floor.  She did not have any presyncope, syncope, or other warnings.  Her Life Alert did not work.  Therefore, she had to drag herself to use the phone.  She suffered a left periprosthetic femur fracture.  She is touch-toe weightbearing and will follow up in two weeks with her orthopedic surgeon.     Other medical issues included postoperative anemia.  She received 7 U of packed red cells.  She also had thrombocytopenia and was actually given a unit of platelets on 07/27/2015.  She was started on ferrous sulfate, and it was felt at that point that her Lovenox was safe to be resumed.    PAST MEDICAL HISTORY/PROBLEM LIST:   Past Medical History  Diagnosis Date  . Pacemaker 08/29/2012    st jude accent DR RF device, model number [id number], serial number [id number], implanted 08/29/2012 for sinus bradycardia, runs of supraventricular tachycardia  last checked 10/23/2012  . ICD (implantable cardiac defibrillator) in place   . SSS (sick sinus syndrome) with PAT and marked bradycardia with 3 sec pauses  08/30/2012  . Status post placement of cardiac pacemaker. 08/29/12 St. Jude device 08/30/2012  . HTN (hypertension) 08/30/2012  . Hyperlipidemia 08/30/2012  . Polyneuropathy in other diseases classified elsewhere (HCC)   . Macular degeneration   . Coronary artery disease     stent in the  LAD artery in 2002  . Venous insufficiency      PAST SURGICAL HISTORY:   Past Surgical History  Procedure Laterality Date  . Cardiac catheterization  2006    3 stents placed  . Insert / replace / remove pacemaker  08/29/2012    st jude   . Rotator cuff repair Left   . Abdominal hysterectomy    . Cesarean section    . Kidney stone surgery    . Rotator cuff repair Right   . Cardiac catheterization  06/2001    placement of BiodivYsio 2.5.10mm stent dilated to 2.43mm in proximal left anterior descenting stenotic lesion  . Radiofrequency ablation  08/25/2010    ablation of left greater saphenous vein  . Hip arthroplasty Left 01/04/2014    Procedure: ARTHROPLASTY BIPOLAR HIP;  Surgeon: Shelda Pal, MD;  Location: [facility] OR;  Service: Orthopedics;  Laterality: Left;  . Permanent pacemaker insertion N/A 08/29/2012    Procedure: PERMANENT PACEMAKER INSERTION;  Surgeon: Thurmon Fair, MD;  Location: [facility] CATH LAB;  Service: Cardiovascular;  Laterality: N/A;  . Total hip revision Left 07/24/2015    Procedure: ORIF LEFT PERIPROSTHETIC FEMUR FRACTURE; CONVERSION TO TOTAL HIP ARTHROPLASTY POSS. FEMORAL  COMPONENT REVISION ;  Surgeon: Samson FredericBrian Swinteck, MD;  [facility];  Service: Orthopedics;  Laterality: Left;      CURRENT MEDICATIONS:   Current Outpatient Prescriptions on File Prior to Visit  Medication Sig Dispense Refill  . acetaminophen (TYLENOL) 500 MG tablet Take 500 mg by mouth every 6 (six) hours as needed for mild pain or moderate pain.     Marland Kitchen. aspirin EC 81 MG tablet Take 81 mg by mouth every morning.     Marland Kitchen. atorvastatin (LIPITOR) 10 MG tablet TAKE ONE (1) TABLET BY MOUTH EVERY DAY 30 tablet 11  . carvedilol (COREG) 3.125 MG tablet Take 1 tablet (3.125 mg total) by mouth 2 (two) times daily. (Patient taking differently: Take 3.125 mg by mouth every morning. ) 180 tablet 3  . Cholecalciferol (VITAMIN D-3 PO) Take 1 capsule by mouth daily.    Marland Kitchen. enoxaparin (LOVENOX) 40 MG/0.4ML injection Inject  0.4 mLs (40 mg total) into the skin daily. 0 Syringe   . ezetimibe (ZETIA) 10 MG tablet Take 10 mg by mouth daily.    . ferrous sulfate 325 (65 FE) MG tablet Take 1 tablet (325 mg total) by mouth 3 (three) times daily with meals.  3  . folic acid (FOLVITE) 1 MG tablet Take 1 tablet (1 mg total) by mouth daily.    . furosemide (LASIX) 20 MG tablet TAKE ONE TABLET BY MOUTH DAILY AS NEEDEDFOR EDEMA 30 tablet 2  . gabapentin (NEURONTIN) 100 MG capsule Take 100 mg by mouth at bedtime.    Marland Kitchen. l-methylfolate-B6-B12 (METANX) 3-35-2 MG TABS TAKE ONE TABLET TWICE DAILY 60 tablet 3  . Multiple Vitamins-Minerals (OCUVITE PO) Take 1 tablet by mouth 2 (two) times daily.    . nitroGLYCERIN (NITROSTAT) 0.4 MG SL tablet Place 1 tablet (0.4 mg total) under the tongue every 5 (five) minutes as needed for chest pain. 100 tablet 3  . pantoprazole (PROTONIX) 40 MG tablet Take 1 tablet (40 mg total) by mouth daily.    Bertram Gala. Polyethyl Glycol-Propyl Glycol (SYSTANE OP) Apply 1 drop to eye daily as needed (Dry Eyes).     . ramipril (ALTACE) 2.5 MG capsule Take 1 capsule (2.5 mg total) by mouth daily. 30 capsule 6  . vitamin B-12 (CYANOCOBALAMIN) 1000 MCG tablet Take 1,000 mcg by mouth daily.         LABORATORY DATA:  Discharge lab work:    Basic metabolic panel:  Normal.  BUN 17, creatinine 0.68.    Hemoglobin 8.9, platelet count 304,000.  Her platelet count appeared to get down to a low of 71,000.     SOCIAL HISTORY:                   HOUSING:  The patient tells me she lives in her own home.  She does have family nearby.   FUNCTIONAL STATUS:  She is reasonably independent with ADLs and IADLs.  Still drives.    FAMILY HISTORy Family History  Problem Relation Age of Onset  . Heart disease Mother   . Congestive Heart Failure Mother   . Heart disease Father   . Heart attack Father   . Heart disease Brother     REVIEW OF SYSTEMS:       GENERAL:  No recent weight loss.     CHEST/RESPIRATORY:  No cough.  No sputum.     CARDIAC:  No chest pain.    GI:  No abdominal pain.   No diarrhea.   GU:  She has some degree of urinary frequency, up three times to void at night.  I might look into that while she is here when she is more mobile.   CNS: No focal weakness  PHYSICAL EXAMINATION:  General: Patient appears stabloe CVS: HS normal. Appears to be euvolemic RESP: Clear air entry bilaterally  MUSCULOSKELETAL:    EXTREMITIES:   LEFT LOWER EXTREMITY:  Her left leg is swollen, but nothing looks ominous here.  I did not remove her surgical dressings.   PSYCHIATRIC:   MENTAL STATUS:  The patient is bright, alert.  Able to give her own history.     ASSESSMENT/PLAN:                 Left periprosthetic femur fracture.  She is touch-toe weightbearing.  I am not sure how much touch-toe weightbearing she is going to be able to do.  We will have to see how she does in therapy.    I had previously labeled this woman as having osteoporosis in 2015.    Postoperative anemia.  Status post 7 U of packed cells.  There is swelling in the thigh.  I am assuming that all the source of bleeding was here.    Thrombocytopenia.  I am not really sure about that, although she is on Lovenox.  Coronary artery disease: s/p stents, pacer; this appears to be stable  The patient appears to be making a good recovery.  She is leaning towards staying here, I think, until she is free to fully mobilize.

## 2015-08-03 NOTE — Telephone Encounter (Signed)
Confirmed remote transmission w/ pt nurse.   

## 2015-08-05 ENCOUNTER — Other Ambulatory Visit: Payer: Self-pay | Admitting: *Deleted

## 2015-08-05 MED ORDER — HYDROCODONE-ACETAMINOPHEN 5-325 MG PO TABS: ORAL_TABLET | ORAL | Status: DC

## 2015-08-05 NOTE — Telephone Encounter (Signed)
[facility] 

## 2015-08-06 ENCOUNTER — Encounter: Payer: Self-pay | Admitting: Cardiology

## 2015-08-08 ENCOUNTER — Other Ambulatory Visit (HOSPITAL_COMMUNITY)
Admission: AD | Admit: 2015-08-08 | Discharge: 2015-08-08 | Disposition: A | Discharge status: Home / Self Care | Payer: Medicare Other | Source: Skilled Nursing Facility | Attending: Internal Medicine | Admitting: Internal Medicine

## 2015-08-08 DIAGNOSIS — N390 Urinary tract infection, site not specified: Secondary | ICD-10-CM | POA: Diagnosis present

## 2015-08-08 LAB — URINALYSIS, ROUTINE W REFLEX MICROSCOPIC
BILIRUBIN URINE: NEGATIVE
Glucose, UA: NEGATIVE mg/dL
KETONES UR: NEGATIVE mg/dL
NITRITE: POSITIVE — AB
Protein, ur: 30 mg/dL — AB
Specific Gravity, Urine: 1.015 (ref 1.005–1.030)
pH: 6 (ref 5.0–8.0)

## 2015-08-08 LAB — URINE MICROSCOPIC-ADD ON

## 2015-08-09 ENCOUNTER — Encounter (HOSPITAL_COMMUNITY)
Admission: RE | Admit: 2015-08-09 | Discharge: 2015-08-09 | Disposition: A | Discharge status: Home / Self Care | Payer: Medicare Other | Source: Skilled Nursing Facility | Attending: Internal Medicine | Admitting: Internal Medicine

## 2015-08-09 LAB — CBC WITH DIFFERENTIAL/PLATELET
BASOS PCT: 0 %
Basophils Absolute: 0 10*3/uL (ref 0.0–0.1)
EOS ABS: 0.1 10*3/uL (ref 0.0–0.7)
Eosinophils Relative: 2 %
HEMATOCRIT: 27.5 % — AB (ref 36.0–46.0)
Hemoglobin: 8.5 g/dL — ABNORMAL LOW (ref 12.0–15.0)
LYMPHS ABS: 1 10*3/uL (ref 0.7–4.0)
Lymphocytes Relative: 22 %
MCH: 29.9 pg (ref 26.0–34.0)
MCHC: 30.9 g/dL (ref 30.0–36.0)
MCV: 96.8 fL (ref 78.0–100.0)
MONOS PCT: 18 %
Monocytes Absolute: 0.8 10*3/uL (ref 0.1–1.0)
NEUTROS ABS: 2.6 10*3/uL (ref 1.7–7.7)
NEUTROS PCT: 58 %
Platelets: 386 10*3/uL (ref 150–400)
RBC: 2.84 MIL/uL — AB (ref 3.87–5.11)
RDW: 16.3 % — ABNORMAL HIGH (ref 11.5–15.5)
WBC: 4.6 10*3/uL (ref 4.0–10.5)

## 2015-08-10 ENCOUNTER — Encounter: Payer: Self-pay | Admitting: Internal Medicine

## 2015-08-10 ENCOUNTER — Other Ambulatory Visit (HOSPITAL_COMMUNITY)
Admission: RE | Admit: 2015-08-10 | Discharge: 2015-08-10 | Disposition: A | Discharge status: Home / Self Care | Payer: Medicare Other | Source: Skilled Nursing Facility | Attending: Internal Medicine | Admitting: Internal Medicine

## 2015-08-10 ENCOUNTER — Non-Acute Institutional Stay (SKILLED_NURSING_FACILITY): Payer: Medicare Other | Admitting: Internal Medicine

## 2015-08-10 DIAGNOSIS — D696 Thrombocytopenia, unspecified: Secondary | ICD-10-CM | POA: Diagnosis not present

## 2015-08-10 DIAGNOSIS — D72819 Decreased white blood cell count, unspecified: Secondary | ICD-10-CM | POA: Diagnosis not present

## 2015-08-10 DIAGNOSIS — R350 Frequency of micturition: Secondary | ICD-10-CM | POA: Diagnosis not present

## 2015-08-10 LAB — CBC WITH DIFFERENTIAL/PLATELET
BASOS ABS: 0 10*3/uL (ref 0.0–0.1)
Basophils Relative: 0 %
EOS PCT: 2 %
Eosinophils Absolute: 0.1 10*3/uL (ref 0.0–0.7)
HCT: 28.2 % — ABNORMAL LOW (ref 36.0–46.0)
Hemoglobin: 8.9 g/dL — ABNORMAL LOW (ref 12.0–15.0)
LYMPHS ABS: 1 10*3/uL (ref 0.7–4.0)
LYMPHS PCT: 36 %
MCH: 30.5 pg (ref 26.0–34.0)
MCHC: 31.6 g/dL (ref 30.0–36.0)
MCV: 96.6 fL (ref 78.0–100.0)
MONO ABS: 0.3 10*3/uL (ref 0.1–1.0)
MONOS PCT: 10 %
Neutro Abs: 1.4 10*3/uL — ABNORMAL LOW (ref 1.7–7.7)
Neutrophils Relative %: 51 %
PLATELETS: UNDETERMINED 10*3/uL (ref 150–400)
RBC: 2.92 MIL/uL — ABNORMAL LOW (ref 3.87–5.11)
RDW: 16.3 % — AB (ref 11.5–15.5)
WBC: 2.7 10*3/uL — ABNORMAL LOW (ref 4.0–10.5)

## 2015-08-10 NOTE — Progress Notes (Signed)
Patient ID: Pamela [name], female   DOB: May 08, 1921, 82 y.o.   MRN: [id number]      Facility; [facility] This is an acute visit.    Chief complaint-acute visit secondary to complaints of urinary frequency  History of present illness.  Patient is a pleasant 82 year old female who actually was here back in June 2015 after repair for a previous left hip fracture.  In this instance she presented to the ER after falling out home-she stated she somehow lost her balance and fell backwards.  She was found to have a periprosthetic fracture of the left femur--this was surgically repaired.  .  She had postop blood loss and anemia thrombocytopenia.  She required 7 units of packed red blood cells and 1 unit of platelets.  It was no sign of active bleeding it was thought her Lovenox was safe to be resumed  She's also been started on iron 3 times a day.  She also has a history of thrombocytopenia on lab done on Jan 10 platelets were clumped previous lab on Jan ninth platelets were 386.  I do note on lab done yesterday however white count is down to 2.7.  Hemoglobin is 8.9.  Patient did complain of some increased urinary frequency recently and a urinalysis and culture were obtained-urinalysis does show a WBC too numerous to count many bacteria positive nitrite small leukocytes-actually today she says her frequency is improved she says symptoms today are not really that bad and usually are worse when she has a UTI  She has been afebrile  .     Previous medical history.  Femur fracture on the left surgically repaired.        Past Medical History   Diagnosis  Date   .  Pacemaker  08/29/2012     st jude accent DR RF device, model number [id number], serial number [id number], implanted 08/29/2012 for sinus bradycardia, runs of supraventricular tachycardia last checked 10/23/2012   .  ICD (implantable cardiac defibrillator) in place    .  SSS (sick sinus syndrome) with PAT and marked  bradycardia with 3 sec pauses  08/30/2012   .  Status post placement of cardiac pacemaker. 08/29/12 St. Jude device  08/30/2012   .  HTN (hypertension)  08/30/2012   .  Hyperlipidemia  08/30/2012   .  Polyneuropathy in other diseases classified elsewhere    .  Macular degeneration    .  Coronary artery disease      stent in the LAD artery in 2002   .  Venous insufficiency     Past Surgical History   Procedure  Laterality  Date   .  Cardiac catheterization   2006     3 stents placed   .  Insert / replace / remove pacemaker   08/29/2012     st jude   .  Rotator cuff repair  Left    .  Abdominal hysterectomy     .  Cesarean section     .  Kidney stone surgery     .  Rotator cuff repair  Right    .  Cardiac catheterization   06/2001     placement of BiodivYsio 2.5.10mm stent dilated to 2.20mm in proximal left anterior descenting stenotic lesion   .  Radiofrequency ablation   08/25/2010     ablation of left greater saphenous vein   .  Hip arthroplasty  Left  01/04/2014     Procedure: ARTHROPLASTY BIPOLAR HIP; Surgeon: Molli Hazard  Rosalia Hammers Olin, MD; Location: [facility] OR; Service: Orthopedics; Laterality: Left;     Medications reviewed per [facility]  Lovenox 40 mg injection daily.  Folic acid 1 mg daily.  Norco 5-3 25 mg-one tablet every 6 hours when necessary severe pain.  Protonix 40 mg daily.  Ferrous sulfate 325 mg 3 times a day.  Tylenol 500 mg every 6 hours when necessary mild or moderate pain.  Aspirin 81 mg daily.  Lipitor 10 mg daily.  Coreg 3.125 mg twice a day.  Vitamin D 1 capsule daily.  Zetia 10 mg daily.  Lasix 20 mg daily when necessary.  Neurontin 100 mg daily at bedtime.  Metanx-daily.  Multivitamin daily.  Nitroglycerin when necessary.  Systane eyedrop 1 drop to eyes when necessary daily area  Altace 2.5 mg daily.  Vitamin B12 1000 g daily.                                                            Marland Kitchen.     0   .      0   .       .       .       .           Social;  Patient lives at home--apparently does have assistance at times   reports that she has never smoked. She does not have any smokeless tobacco history on file. She reports that she does not drink alcohol or use illicit drugs .  Fam HX; indicated that her mother is deceased. She indicated that her father is deceased. She indicated that only one of her two sisters are alive. She indicated that her brother is deceased .  Review of systems In general no complaints of fever or chills.  Head ears eyes nose mouth and throat does not complaining of any visual changes or nasal discharge.  Skin does not complaining of any itching or rashes surgical site left hip is covered  Respiratory no shortness of breath or cough Cardiac no chest pain --minimal low ext edema GI no abdominal pain or constipation  GU has complained of increased frequency but today  indicates it somewhat better as noted above.  Musculoskeletal  At this point hip pain appears to be controlled -she is receiving Norco w Neurologic does not complaining of any headache or dizziness.  Psych is not complaining of any anxiety or depression   Physical examination  Physical exam. Temperature 97.9 pulse 61 respirations 18 blood pressure 136/56 Gen.  well looking 82 year old woman bright and alert  Her skin is warm and dry surgical site left hip is currently covered per surgery does not complain of pain Eyes pupils appear reactive to light her visual acuity is grossly intact Oropharynx is clear mucous membranes moist  Respiratory clear entry bilaterally --no labored breathing Cardiac heart sounds are normal there are no murmurs gallops or bruits-- She has minimal lower extremity edema pedal pulses are somewhat reduced bilaterally--edema on the left appear somewhat decreased from previous exam Abdomen -soft--is nontender positive bowel sounds  GU-I could not really appreciate any suprapubic tenderness or distention-or  acute CV tenderness   Musculoskeletal l  I did not note any deformities is able to move all extremities limited left leg secondary to the surgery again  she has minimal lower extremity edema.  Neurologic is grossly intact her speech is clear no lateralizing findings.  Psych she is alert and oriented continues to be pleasant and appropriate  Neurologic is grossly intact her speech is clear no lateralizing findings   Psych-she is alert and oriented pleasant and appropriate.  Labs.  08/09/2015.  WBC 4.6 hemoglobin 8.5 platelets 386.  08/10/2015.  WBC 2.7 hemoglobin 8.9  07/30/2015.  WBC 7.5 hemoglobin 9.1 platelets 194.  Sodium 141 potassium 3.7 BUN 15 creatinine 0.64    Impression/plan   History dysuria-question UTIs she says her symptoms are better today-culture is pending will await culture results before starting antibiotic she is afebrile   Left hip periprostatic femur fracture-with history of previous left hip fracture-again this was surgically repaired appears to have tolerated procedure well she will need continued therapy    History of leukopenia-lab done yesterday shows white count of 2700 which has gone lower than her baseline-will update this tomorrow previous white count was 4600         Thrombocytopenia; . She does appear to have a history of this has receive a platelet transfusion this appears stable with platelets of 286 on lab done on January 9-we will update this  [phone]

## 2015-08-11 ENCOUNTER — Encounter (HOSPITAL_COMMUNITY)
Admission: AD | Admit: 2015-08-11 | Discharge: 2015-08-11 | Disposition: A | Discharge status: Home / Self Care | Payer: Medicare Other | Source: Skilled Nursing Facility | Attending: Internal Medicine | Admitting: Internal Medicine

## 2015-08-11 LAB — CBC WITH DIFFERENTIAL/PLATELET
Basophils Absolute: 0 10*3/uL (ref 0.0–0.1)
Basophils Relative: 0 %
Eosinophils Absolute: 0.1 10*3/uL (ref 0.0–0.7)
Eosinophils Relative: 3 %
HEMATOCRIT: 27 % — AB (ref 36.0–46.0)
HEMOGLOBIN: 8.4 g/dL — AB (ref 12.0–15.0)
LYMPHS PCT: 37 %
Lymphs Abs: 1.2 10*3/uL (ref 0.7–4.0)
MCH: 30 pg (ref 26.0–34.0)
MCHC: 31.1 g/dL (ref 30.0–36.0)
MCV: 96.4 fL (ref 78.0–100.0)
MONO ABS: 0.6 10*3/uL (ref 0.1–1.0)
MONOS PCT: 18 %
NEUTROS ABS: 1.3 10*3/uL — AB (ref 1.7–7.7)
Neutrophils Relative %: 42 %
Platelets: 336 10*3/uL (ref 150–400)
RBC: 2.8 MIL/uL — ABNORMAL LOW (ref 3.87–5.11)
RDW: 16.1 % — AB (ref 11.5–15.5)
WBC: 3.1 10*3/uL — ABNORMAL LOW (ref 4.0–10.5)

## 2015-08-11 LAB — BASIC METABOLIC PANEL
ANION GAP: 6 (ref 5–15)
BUN: 13 mg/dL (ref 6–20)
CHLORIDE: 110 mmol/L (ref 101–111)
CO2: 27 mmol/L (ref 22–32)
Calcium: 8.6 mg/dL — ABNORMAL LOW (ref 8.9–10.3)
Creatinine, Ser: 0.6 mg/dL (ref 0.44–1.00)
GFR calc Af Amer: >60 mL/min (ref >60)
GFR calc non Af Amer: >60 mL/min (ref >60)
GLUCOSE: 100 mg/dL — AB (ref 65–99)
POTASSIUM: 3.9 mmol/L (ref 3.5–5.1)
Sodium: 143 mmol/L (ref 135–145)

## 2015-08-12 LAB — URINE CULTURE: Culture: >=100000

## 2015-09-10 DIAGNOSIS — Z471 Aftercare following joint replacement surgery: Secondary | ICD-10-CM | POA: Diagnosis not present

## 2015-09-10 DIAGNOSIS — M9702XD Periprosthetic fracture around internal prosthetic left hip joint, subsequent encounter: Secondary | ICD-10-CM | POA: Diagnosis not present

## 2015-09-10 DIAGNOSIS — Z96642 Presence of left artificial hip joint: Secondary | ICD-10-CM | POA: Diagnosis not present

## 2015-09-14 ENCOUNTER — Encounter (HOSPITAL_COMMUNITY)
Admission: RE | Admit: 2015-09-14 | Discharge: 2015-09-14 | Disposition: A | Discharge status: Home / Self Care | Payer: Medicare Other | Source: Skilled Nursing Facility | Attending: Internal Medicine | Admitting: Internal Medicine

## 2015-09-14 DIAGNOSIS — K252 Acute gastric ulcer with both hemorrhage and perforation: Secondary | ICD-10-CM | POA: Insufficient documentation

## 2015-09-14 LAB — C DIFFICILE QUICK SCREEN W PCR REFLEX
C DIFFICILE (CDIFF) INTERP: NEGATIVE
C Diff antigen: NEGATIVE
C Diff toxin: NEGATIVE

## 2015-09-27 DIAGNOSIS — I259 Chronic ischemic heart disease, unspecified: Secondary | ICD-10-CM | POA: Diagnosis not present

## 2015-09-27 DIAGNOSIS — N189 Chronic kidney disease, unspecified: Secondary | ICD-10-CM | POA: Diagnosis not present

## 2015-09-27 DIAGNOSIS — M810 Age-related osteoporosis without current pathological fracture: Secondary | ICD-10-CM | POA: Diagnosis not present

## 2015-09-27 DIAGNOSIS — E785 Hyperlipidemia, unspecified: Secondary | ICD-10-CM | POA: Diagnosis not present

## 2015-09-27 DIAGNOSIS — M9702XD Periprosthetic fracture around internal prosthetic left hip joint, subsequent encounter: Secondary | ICD-10-CM | POA: Diagnosis not present

## 2015-09-27 DIAGNOSIS — R262 Difficulty in walking, not elsewhere classified: Secondary | ICD-10-CM | POA: Diagnosis not present

## 2015-09-27 DIAGNOSIS — D62 Acute posthemorrhagic anemia: Secondary | ICD-10-CM | POA: Diagnosis not present

## 2015-09-27 DIAGNOSIS — I10 Essential (primary) hypertension: Secondary | ICD-10-CM | POA: Diagnosis not present

## 2015-09-27 DIAGNOSIS — R278 Other lack of coordination: Secondary | ICD-10-CM | POA: Diagnosis not present

## 2015-09-27 DIAGNOSIS — D696 Thrombocytopenia, unspecified: Secondary | ICD-10-CM | POA: Diagnosis not present

## 2015-09-27 DIAGNOSIS — G250 Essential tremor: Secondary | ICD-10-CM | POA: Diagnosis not present

## 2015-09-27 DIAGNOSIS — M6281 Muscle weakness (generalized): Secondary | ICD-10-CM | POA: Diagnosis not present

## 2015-10-06 ENCOUNTER — Non-Acute Institutional Stay (SKILLED_NURSING_FACILITY): Payer: Medicare Other | Admitting: Internal Medicine

## 2015-10-06 DIAGNOSIS — M9702XD Periprosthetic fracture around internal prosthetic left hip joint, subsequent encounter: Secondary | ICD-10-CM | POA: Diagnosis not present

## 2015-10-06 DIAGNOSIS — I10 Essential (primary) hypertension: Secondary | ICD-10-CM

## 2015-10-06 DIAGNOSIS — D696 Thrombocytopenia, unspecified: Secondary | ICD-10-CM

## 2015-10-06 DIAGNOSIS — D62 Acute posthemorrhagic anemia: Secondary | ICD-10-CM | POA: Diagnosis not present

## 2015-10-06 NOTE — Progress Notes (Signed)
Patient ID: Pamela [name], female   DOB: 06-06-1921, 82 y.o.   MRN: [id number]       Facility; [facility] This is a discharge note.    Chief complaint-discharge note  History of present illness.  Patient is a pleasant 82 year old female who actually was here back in June 2015 after repair for a previous left hip fracture.  In this instance she presented to the ER after falling out home-she stated she somehow lost her balance and fell backwards.  She was found to have a periprosthetic fracture of the left femur--this was surgically repaired.  .  She had postop blood loss and anemia thrombocytopenia.  She required 7 units of packed red blood cells and 1 unit of platelets.  It was no sign of active bleeding it was thought her Lovenox was safe to be resumed--he has completed this for DVT prophylaxis and now is on aspirin  She's also been started on iron 3 times a day.  She also has a history of thrombocytopenia On lab done on 08/11/2015 hemoglobin was 8.4 white count was 3.1 platelets 336  I do note on lab done previously however white count is down to 2.7.  Hemoglobin iwas8.9.  Her stay here has been quite unremarkable-she feels she has gotten stronger she has done well with therapy she is looking forward to going home-she is now 50% weightbearing on her left lower extremity and on March 10 will be able to increase to 75% weightbearing per orthopedics.  She ambulates with a rolling walker and supervision.  She is having a ramp installed at her home and when that's completed she will be discharged later this week  .     Previous medical history.  Femur fracture on the left surgically repaired.        Past Medical History   Diagnosis  Date   .  Pacemaker  08/29/2012     st jude accent DR RF device, model number [id number], serial number [id number], implanted 08/29/2012 for sinus bradycardia, runs of supraventricular tachycardia last checked 10/23/2012   .  ICD  (implantable cardiac defibrillator) in place    .  SSS (sick sinus syndrome) with PAT and marked bradycardia with 3 sec pauses  08/30/2012   .  Status post placement of cardiac pacemaker. 08/29/12 St. Jude device  08/30/2012   .  HTN (hypertension)  08/30/2012   .  Hyperlipidemia  08/30/2012   .  Polyneuropathy in other diseases classified elsewhere    .  Macular degeneration    .  Coronary artery disease      stent in the LAD artery in 2002   .  Venous insufficiency     Past Surgical History   Procedure  Laterality  Date   .  Cardiac catheterization   2006     3 stents placed   .  Insert / replace / remove pacemaker   08/29/2012     st jude   .  Rotator cuff repair  Left    .  Abdominal hysterectomy     .  Cesarean section     .  Kidney stone surgery     .  Rotator cuff repair  Right    .  Cardiac catheterization   06/2001     placement of BiodivYsio 2.5.10mm stent dilated to 2.60mm in proximal left anterior descenting stenotic lesion   .  Radiofrequency ablation   08/25/2010     ablation of left greater saphenous vein   .  Hip arthroplasty  Left  01/04/2014     Procedure: ARTHROPLASTY BIPOLAR HIP; Surgeon: Shelda PalMatthew D Olin, MD; [facility]; Service: Orthopedics; Laterality: Left;     Medications [facility]    Folic acid 1 mg daily.  Norco 5-3 25 mg-one tablet every 6 hours when necessary severe pain.  Protonix 40 mg daily.  Ferrous sulfate 325 mg 3 times a day.  Tylenol 500 mg every 6 hours when necessary mild or moderate pain.  Aspirin 81 mg daily.  Lipitor 10 mg daily.  Coreg 3.125 mg twice a day.  Vitamin D 1 capsule daily.  Zetia 10 mg daily.  Lasix 20 mg daily when necessary.  Neurontin 100 mg daily at bedtime.  Metanx-daily.  Multivitamin daily.  Nitroglycerin when necessary.  Systane eyedrop 1 drop to eyes when necessary daily area  Altace 2.5 mg daily.  Vitamin B12 1000 g daily.                                                             Marland Kitchen.     0   .      0   .       .       .       .          Social;  Patient lives at home--apparently does have assistance at times   reports that she has never smoked. She does not have any smokeless tobacco history on file. She reports that she does not drink alcohol or use illicit drugs .  Fam HX; indicated that her mother is deceased. She indicated that her father is deceased. She indicated that only one of her two sisters are alive. She indicated that her brother is deceased .  Review of systems In general no complaints of fever or chills.  Head ears eyes nose mouth and throat does not complaining of any visual changes or nasal discharge.  Skin does not complaining of any itching or rashes surgical site left hip appears to have healed unremarkably Respiratory no shortness of breath or cough Cardiac no chest pain --minimal low ext edema GI no abdominal pain or constipation  GU does not complain of dysuria or frequency increased today Musculoskeletal  At this point hip pain appears to be controlled -she is receiving Norco  Neurologic does not complaining of any headache or dizziness.  Psych is not complaining of any anxiety or depression   Physical examination  Physical exam. Temperature 98.1 pulse 63 respirations 18 blood pressure 154/89 weight is 145 this appears to be fairly stable Gen.  well looking 82 year old woman bright and alert  Her skin is warm and dry surgical site left hip appears to be healed unremarkably there is a well-healed scar no sign of infection Eyes pupils appear reactive to light her visual acuity is grossly intact she has prescription lenses Oropharynx is clear mucous membranes moist  Respiratory clear entry bilaterally --no labored breathing Cardiac heart sounds are normal there are no murmurs gallops or bruits-- She has minimal lower extremity edema pedal pulses are somewhat reduced bilaterally--  Abdomen -soft--is nontender positive  bowel sounds  GU-I could not really appreciate any suprapubic tenderness or distention-  Musculoskeletal this is able to stand without assistance and use her  walker she does have the well-healed surgical scar on the left-she appears to be doing well in this regards.    Neurologic is grossly intact her speech is clear no lateralizing findings.  Psych she is alert and oriented continues to be pleasant and appropriate     Labs.  Generally 11 2017.  Sodium 143 potassium 3.9 BUN 13 creatinine 0.6.  WBC 3.1 hemoglobin 8.4 platelets 336  08/09/2015.  WBC 4.6 hemoglobin 8.5 platelets 386.  08/10/2015.  WBC 2.7 hemoglobin 8.9  07/30/2015.  WBC 7.5 hemoglobin 9.1 platelets 194.  Sodium 141 potassium 3.7 BUN 15 creatinine 0.64    Impression/plan      Left hip periprostatic femur fracture-with history of previous left hip fracture-again this was surgically repaired appears to have tolerated procedure well she will need continued therapy at home --he is followed closely by orthopedics-she is having a mental wrap installed at her home and at that point will be discharge she appears to be doing well in this regards-she is on aspirin for anticoagulation is receiving Norco as needed for pain she also has an order for Robaxin as needed every 8 hours-she is also receiving Neurontin.            Thrombocytopenia; . She does appear to have a history of this has receive a platelet transfusion this appears stable with platelets of 336 on lab done in January will update this before discharge.  History of leukopenia again white count has been as low as the mid twos at 2.7 this appears to have risen recently Will update this before discharge as well.  Anemia--suspect acute blood loss with surgery-hemoglobin most recently 8.4 she is on an aggressive iron supplementation Will update this before discharge clinically she appears stable I note she is also on B12 supplementation as  well  History of hypertension-systolics appear to be somewhat elevated at times most recently in the 150s I see ranging from the 130s to 170s at times-and she is about to be discharge will further primary care provider she is on numerous medications including Coreg 3.25 mg twice a day ramipril 2.5 mg daily  Hyperlipidemia-since her stay here was short was not aggressive pursuing a lipid panel she does continue on Lipitor as well as that area  [id number]-of note greater than 30 minutes spent on this discharge summary-greater than 50% of time spent coordinating plan of care for numerous diagnoses

## 2015-10-07 ENCOUNTER — Encounter (HOSPITAL_COMMUNITY)
Admission: AD | Admit: 2015-10-07 | Discharge: 2015-10-07 | Disposition: A | Discharge status: Home / Self Care | Payer: Medicare Other | Source: Skilled Nursing Facility | Attending: Internal Medicine | Admitting: Internal Medicine

## 2015-10-07 DIAGNOSIS — Z950 Presence of cardiac pacemaker: Secondary | ICD-10-CM | POA: Insufficient documentation

## 2015-10-07 DIAGNOSIS — Z9181 History of falling: Secondary | ICD-10-CM | POA: Insufficient documentation

## 2015-10-07 DIAGNOSIS — Z4789 Encounter for other orthopedic aftercare: Secondary | ICD-10-CM | POA: Insufficient documentation

## 2015-10-07 DIAGNOSIS — M9702XD Periprosthetic fracture around internal prosthetic left hip joint, subsequent encounter: Secondary | ICD-10-CM | POA: Insufficient documentation

## 2015-10-07 DIAGNOSIS — D509 Iron deficiency anemia, unspecified: Secondary | ICD-10-CM | POA: Insufficient documentation

## 2015-10-07 LAB — COMPREHENSIVE METABOLIC PANEL
ALBUMIN: 3.9 g/dL (ref 3.5–5.0)
ALT: 13 U/L — ABNORMAL LOW (ref 14–54)
ANION GAP: 7 (ref 5–15)
AST: 20 U/L (ref 15–41)
Alkaline Phosphatase: 74 U/L (ref 38–126)
BUN: 14 mg/dL (ref 6–20)
CHLORIDE: 109 mmol/L (ref 101–111)
CO2: 29 mmol/L (ref 22–32)
Calcium: 9.6 mg/dL (ref 8.9–10.3)
Creatinine, Ser: 0.62 mg/dL (ref 0.44–1.00)
GFR calc Af Amer: >60 mL/min (ref >60)
GFR calc non Af Amer: >60 mL/min (ref >60)
GLUCOSE: 103 mg/dL — AB (ref 65–99)
POTASSIUM: 3.8 mmol/L (ref 3.5–5.1)
SODIUM: 145 mmol/L (ref 135–145)
Total Bilirubin: 0.6 mg/dL (ref 0.3–1.2)
Total Protein: 6.3 g/dL — ABNORMAL LOW (ref 6.5–8.1)

## 2015-10-07 LAB — CBC WITH DIFFERENTIAL/PLATELET
BASOS ABS: 0 10*3/uL (ref 0.0–0.1)
BASOS PCT: 0 %
EOS ABS: 0.1 10*3/uL (ref 0.0–0.7)
Eosinophils Relative: 3 %
HEMATOCRIT: 37.1 % (ref 36.0–46.0)
Hemoglobin: 11.5 g/dL — ABNORMAL LOW (ref 12.0–15.0)
Lymphocytes Relative: 54 %
Lymphs Abs: 2.1 10*3/uL (ref 0.7–4.0)
MCH: 28.5 pg (ref 26.0–34.0)
MCHC: 31 g/dL (ref 30.0–36.0)
MCV: 91.8 fL (ref 78.0–100.0)
MONO ABS: 0.4 10*3/uL (ref 0.1–1.0)
MONOS PCT: 9 %
NEUTROS ABS: 1.3 10*3/uL — AB (ref 1.7–7.7)
NEUTROS PCT: 34 %
Platelets: 168 10*3/uL (ref 150–400)
RBC: 4.04 MIL/uL (ref 3.87–5.11)
RDW: 15.4 % (ref 11.5–15.5)
WBC: 3.8 10*3/uL — ABNORMAL LOW (ref 4.0–10.5)

## 2015-10-10 ENCOUNTER — Encounter: Payer: Self-pay | Admitting: Internal Medicine

## 2015-10-19 DIAGNOSIS — K219 Gastro-esophageal reflux disease without esophagitis: Secondary | ICD-10-CM | POA: Diagnosis not present

## 2015-10-19 DIAGNOSIS — I129 Hypertensive chronic kidney disease with stage 1 through stage 4 chronic kidney disease, or unspecified chronic kidney disease: Secondary | ICD-10-CM | POA: Diagnosis not present

## 2015-10-19 DIAGNOSIS — N182 Chronic kidney disease, stage 2 (mild): Secondary | ICD-10-CM | POA: Diagnosis not present

## 2015-10-19 DIAGNOSIS — Z9181 History of falling: Secondary | ICD-10-CM | POA: Diagnosis not present

## 2015-10-19 DIAGNOSIS — I2510 Atherosclerotic heart disease of native coronary artery without angina pectoris: Secondary | ICD-10-CM | POA: Diagnosis not present

## 2015-10-19 DIAGNOSIS — E785 Hyperlipidemia, unspecified: Secondary | ICD-10-CM | POA: Diagnosis not present

## 2015-10-19 DIAGNOSIS — Z96642 Presence of left artificial hip joint: Secondary | ICD-10-CM | POA: Diagnosis not present

## 2015-10-19 DIAGNOSIS — S72002D Fracture of unspecified part of neck of left femur, subsequent encounter for closed fracture with routine healing: Secondary | ICD-10-CM | POA: Diagnosis not present

## 2015-10-19 DIAGNOSIS — D509 Iron deficiency anemia, unspecified: Secondary | ICD-10-CM | POA: Diagnosis not present

## 2015-10-19 DIAGNOSIS — Z950 Presence of cardiac pacemaker: Secondary | ICD-10-CM | POA: Diagnosis not present

## 2015-10-26 DIAGNOSIS — Z96642 Presence of left artificial hip joint: Secondary | ICD-10-CM | POA: Diagnosis not present

## 2015-10-26 DIAGNOSIS — Z471 Aftercare following joint replacement surgery: Secondary | ICD-10-CM | POA: Diagnosis not present

## 2015-10-26 DIAGNOSIS — M9702XD Periprosthetic fracture around internal prosthetic left hip joint, subsequent encounter: Secondary | ICD-10-CM | POA: Diagnosis not present

## 2015-11-18 DIAGNOSIS — Z6826 Body mass index (BMI) 26.0-26.9, adult: Secondary | ICD-10-CM | POA: Diagnosis not present

## 2015-11-18 DIAGNOSIS — Z0001 Encounter for general adult medical examination with abnormal findings: Secondary | ICD-10-CM | POA: Diagnosis not present

## 2015-11-18 DIAGNOSIS — Z1389 Encounter for screening for other disorder: Secondary | ICD-10-CM | POA: Diagnosis not present

## 2015-12-13 DIAGNOSIS — H353113 Nonexudative age-related macular degeneration, right eye, advanced atrophic without subfoveal involvement: Secondary | ICD-10-CM | POA: Diagnosis not present

## 2015-12-22 ENCOUNTER — Encounter: Payer: Self-pay | Admitting: Adult Health

## 2015-12-22 ENCOUNTER — Ambulatory Visit (INDEPENDENT_AMBULATORY_CARE_PROVIDER_SITE_OTHER): Payer: Medicare Other | Admitting: Adult Health

## 2015-12-22 ENCOUNTER — Ambulatory Visit: Payer: Medicare Other | Admitting: Adult Health

## 2015-12-22 VITALS — BP 136/82 | HR 68 | Ht 65.0 in | Wt 146.2 lb

## 2015-12-22 DIAGNOSIS — R269 Unspecified abnormalities of gait and mobility: Secondary | ICD-10-CM | POA: Diagnosis not present

## 2015-12-22 DIAGNOSIS — G609 Hereditary and idiopathic neuropathy, unspecified: Secondary | ICD-10-CM | POA: Diagnosis not present

## 2015-12-22 MED ORDER — GABAPENTIN 100 MG PO CAPS: 200.0000 mg | ORAL_CAPSULE | Freq: Every day | ORAL | Status: DC

## 2015-12-22 NOTE — Patient Instructions (Signed)
Increase Gabapentin to 200 mg at bedtime If your symptoms worsen or you develop new symptoms please let us know.

## 2015-12-22 NOTE — Progress Notes (Signed)
I have read the note, and I agree with the clinical assessment and plan.  WILLIS,CHARLES KEITH   

## 2015-12-22 NOTE — Progress Notes (Signed)
PATIENT: Pamela [name] DOB: 15-May-1921  REASON FOR VISIT: follow up-peripheral neuropathy HISTORY FROM: patient  HISTORY OF PRESENT ILLNESS: Pamela [name] is a 82 year old female with a history of peripheral neuropathy with gait disturbance. She returns today for follow-up. She does report numbness in the feet. She does take gabapentin 100 mg at bedtime. She reports that this was working well however she has been having burning and painful sensations in the feet in the morning when she is still in bed. She states that she puts a heating pad on her feet it helps the discomfort. She uses a cane or walker when ambulating. She has not had any additional falls. She returns today for an evaluation.  HISTORY 03/24/15 (WILLIS): Pamela [name] is a 82 year old right-handed white female with a history of a peripheral neuropathy associated with a gait disturbance. The patient fell in July 2016, and fractured the transverse processes on the L1 and L2 vertebra. The patient is still having some discomfort following this fall. She ambulates with a cane, but she fell in the home environment, and she was not using a cane at that time. She has had 2 falls since last seen. She has some discomfort in the left hip following a partial hip replacement, she will get injections into the joint on occasion. The patient returns to this office for further evaluation. She does have some numbness in the feet, she denies any significant discomfort in the feet, however. She is sleeping well, she takes very low-dose gabapentin, 100 mg at night  REVIEW OF SYSTEMS: Out of a complete 14 system review of symptoms, the patient complains only of the following symptoms, and all other reviewed systems are negative.   See history of present illness ALLERGIES: No Known Allergies  HOME MEDICATIONS: Outpatient Prescriptions Prior to Visit  Medication Sig Dispense Refill  . acetaminophen (TYLENOL) 500 MG tablet Take 500 mg by mouth every  6 (six) hours as needed for mild pain or moderate pain.     Marland Kitchen aspirin EC 81 MG tablet Take 81 mg by mouth every morning.     Marland Kitchen atorvastatin (LIPITOR) 10 MG tablet TAKE ONE (1) TABLET BY MOUTH EVERY DAY 30 tablet 11  . carvedilol (COREG) 3.125 MG tablet Take 1 tablet (3.125 mg total) by mouth 2 (two) times daily. (Patient taking differently: Take 3.125 mg by mouth every morning. ) 180 tablet 3  . Cholecalciferol (VITAMIN D-3 PO) Take 1 capsule by mouth daily.    Marland Kitchen ezetimibe (ZETIA) 10 MG tablet Take 10 mg by mouth daily.    . ferrous sulfate 325 (65 FE) MG tablet Take 1 tablet (325 mg total) by mouth 3 (three) times daily with meals.  3  . folic acid (FOLVITE) 1 MG tablet Take 1 tablet (1 mg total) by mouth daily.    . furosemide (LASIX) 20 MG tablet TAKE ONE TABLET BY MOUTH DAILY AS NEEDEDFOR EDEMA 30 tablet 2  . gabapentin (NEURONTIN) 100 MG capsule Take 100 mg by mouth at bedtime.    Marland Kitchen HYDROcodone-acetaminophen (NORCO/VICODIN) 5-325 MG tablet Take one tablet by mouth every 4 hours as needed for pain. Max APAP 3gm/24hrs from all sources 180 tablet 0  . l-methylfolate-B6-B12 (METANX) 3-35-2 MG TABS TAKE ONE TABLET TWICE DAILY 60 tablet 3  . Multiple Vitamins-Minerals (OCUVITE PO) Take 1 tablet by mouth 2 (two) times daily.    . nitroGLYCERIN (NITROSTAT) 0.4 MG SL tablet Place 1 tablet (0.4 mg total) under the tongue every 5 (five)  minutes as needed for chest pain. 100 tablet 3  . pantoprazole (PROTONIX) 40 MG tablet Take 1 tablet (40 mg total) by mouth daily.    Bertram Gala Glycol-Propyl Glycol (SYSTANE OP) Apply 1 drop to eye daily as needed (Dry Eyes).     . ramipril (ALTACE) 2.5 MG capsule Take 1 capsule (2.5 mg total) by mouth daily. 30 capsule 6  . vitamin B-12 (CYANOCOBALAMIN) 1000 MCG tablet Take 1,000 mcg by mouth daily.     No facility-administered medications prior to visit.    PAST MEDICAL HISTORY: Past Medical History  Diagnosis Date  . Pacemaker 08/29/2012    st jude accent DR  RF device, model number [id number], serial number [id number], implanted 08/29/2012 for sinus bradycardia, runs of supraventricular tachycardia  last checked 10/23/2012  . ICD (implantable cardiac defibrillator) in place   . SSS (sick sinus syndrome) with PAT and marked bradycardia with 3 sec pauses  08/30/2012  . Status post placement of cardiac pacemaker. 08/29/12 St. Jude device 08/30/2012  . HTN (hypertension) 08/30/2012  . Hyperlipidemia 08/30/2012  . Polyneuropathy in other diseases classified elsewhere (HCC)   . Macular degeneration   . Coronary artery disease     stent in the LAD artery in 2002  . Venous insufficiency     PAST SURGICAL HISTORY: Past Surgical History  Procedure Laterality Date  . Cardiac catheterization  2006    3 stents placed  . Insert / replace / remove pacemaker  08/29/2012    st jude   . Rotator cuff repair Left   . Abdominal hysterectomy    . Cesarean section    . Kidney stone surgery    . Rotator cuff repair Right   . Cardiac catheterization  06/2001    placement of BiodivYsio 2.5.10mm stent dilated to 2.71mm in proximal left anterior descenting stenotic lesion  . Radiofrequency ablation  08/25/2010    ablation of left greater saphenous vein  . Hip arthroplasty Left 01/04/2014    Procedure: ARTHROPLASTY BIPOLAR HIP;  Surgeon: Shelda Pal, MD;  Location: [facility] OR;  Service: Orthopedics;  Laterality: Left;  . Permanent pacemaker insertion N/A 08/29/2012    Procedure: PERMANENT PACEMAKER INSERTION;  Surgeon: Thurmon Fair, MD;  Location: [facility] CATH LAB;  Service: Cardiovascular;  Laterality: N/A;  . Total hip revision Left 07/24/2015    Procedure: ORIF LEFT PERIPROSTHETIC FEMUR FRACTURE; CONVERSION TO TOTAL HIP ARTHROPLASTY POSS. FEMORAL COMPONENT REVISION ;  Surgeon: Samson Frederic, MD;  Location: [facility] OR;  Service: Orthopedics;  Laterality: Left;    FAMILY HISTORY: Family History  Problem Relation Age of Onset  . Heart disease Mother   . Congestive Heart Failure  Mother   . Heart disease Father   . Heart attack Father   . Heart disease Brother     SOCIAL HISTORY: Social History   Social History  . Marital Status: Widowed    Spouse Name: N/A  . Number of Children: 2  . Years of Education: N/A   Occupational History  .     Social History Main Topics  . Smoking status: Never Smoker   . Smokeless tobacco: Never Used  . Alcohol Use: No  . Drug Use: No  . Sexual Activity: Not on file   Other Topics Concern  . Not on file   Social History Narrative   Patient drinks about 2 cups of caffeine daily.   Patient is right handed.      PHYSICAL EXAM  Filed Vitals:   12/22/15 1109  BP: 136/82  Pulse: 68  Height: 5\' 5"  (1.651 m)  Weight: 146 lb 3.2 oz (66.316 kg)   Body mass index is 24.33 kg/(m^2).  Generalized: Well developed, in no acute distress   Neurological examination  Mentation: Alert oriented to time, place, history taking. Follows all commands speech and language fluent Cranial nerve II-XII: Pupils were equal round reactive to light. Extraocular movements were full, visual field were full on confrontational test. Facial sensation and strength were normal. Uvula tongue midline. Head turning and shoulder shrug  were normal and symmetric. Motor: The motor testing reveals 5 over 5 strength of all 4 extremities. Good symmetric motor tone is noted throughout.  Sensory: Sensory testing is intact to soft touch on all 4 extremities. Pinprick sensation decreased in the lower extremities in a stocking-like pattern. No evidence of extinction is noted.  Coordination: Cerebellar testing reveals good finger-nose-finger and heel-to-shin bilaterally.  Gait and station: Gait is slightly unsteady and wide-based. She uses a cane when ambulating. Tandem gait not attempted. Reflexes: Deep tendon reflexes are symmetric and normal bilaterally.   DIAGNOSTIC DATA (LABS, IMAGING, TESTING) - I reviewed patient records, labs, notes, testing and imaging  myself where available.  Lab Results  Component Value Date   WBC 3.8* 10/07/2015   HGB 11.5* 10/07/2015   HCT 37.1 10/07/2015   MCV 91.8 10/07/2015   PLT 168 10/07/2015      Component Value Date/Time   NA 145 10/07/2015 0745   K 3.8 10/07/2015 0745   CL 109 10/07/2015 0745   CO2 29 10/07/2015 0745   GLUCOSE 103* 10/07/2015 0745   BUN 14 10/07/2015 0745   CREATININE 0.62 10/07/2015 0745   CREATININE 0.82 08/13/2013 1201   CALCIUM 9.6 10/07/2015 0745   PROT 6.3* 10/07/2015 0745   ALBUMIN 3.9 10/07/2015 0745   AST 20 10/07/2015 0745   ALT 13* 10/07/2015 0745   ALKPHOS 74 10/07/2015 0745   BILITOT 0.6 10/07/2015 0745   GFRNONAA >60 10/07/2015 0745   GFRAA >60 10/07/2015 0745       ASSESSMENT AND PLAN 82 y.o. year old female  has a past medical history of Pacemaker (08/29/2012); ICD (implantable cardiac defibrillator) in place; SSS (sick sinus syndrome) with PAT and marked bradycardia with 3 sec pauses  (08/30/2012); Status post placement of cardiac pacemaker. 08/29/12 St. Jude device (08/30/2012); HTN (hypertension) (08/30/2012); Hyperlipidemia (08/30/2012); Polyneuropathy in other diseases classified elsewhere [facility](HCC); Macular degeneration; Coronary artery disease; and Venous insufficiency. here with :  1. Peripheral neuropathy 2. Gait abnormality  The patient is having increased discomfort in the lower extremities in the morning hours. I will increase gabapentin to 200 mg at bedtime. I have advised the patient that gabapentin can cause drowsiness so she should exercise caution if she has to get up during the night. Patient verbalized understanding. She should also avoid taking Advil PM with the increased dose of gabapentin until she knows that she can tolerate the increase in gabapentin. Patient verbalized understanding. She will follow-up in 6 months or sooner if needed.     Butch PennyMegan Javyn Havlin, MSN, NP-C 12/22/2015, 11:14 AM [facility] [address], [address] [place], [place] [address] [phone]

## 2015-12-23 ENCOUNTER — Ambulatory Visit (HOSPITAL_COMMUNITY): Payer: Medicare Other | Attending: Orthopedic Surgery | Admitting: Physical Therapy

## 2015-12-23 DIAGNOSIS — R2681 Unsteadiness on feet: Secondary | ICD-10-CM | POA: Diagnosis not present

## 2015-12-23 DIAGNOSIS — R262 Difficulty in walking, not elsewhere classified: Secondary | ICD-10-CM | POA: Diagnosis not present

## 2015-12-23 DIAGNOSIS — M6281 Muscle weakness (generalized): Secondary | ICD-10-CM

## 2015-12-23 DIAGNOSIS — Z9181 History of falling: Secondary | ICD-10-CM

## 2015-12-23 DIAGNOSIS — M25552 Pain in left hip: Secondary | ICD-10-CM | POA: Diagnosis not present

## 2015-12-23 NOTE — Therapy (Signed)
[facility] [address] [place], [place], [address] Phone: [phone]   Fax:  [phone]  Physical Therapy Evaluation  Patient Details  Name: Pamela [name] MRN: [id number] Date of Birth: [birth year] Referring Provider: Samson Frederic   Encounter Date: 12/23/2015      PT End of Session - 12/23/15 1720    Visit Number 1   Number of Visits 16   Date for PT Re-Evaluation 01/20/16   Authorization Type BCBS Medicare    Authorization Time Period 12/23/15 to 02/22/16   Authorization - Visit Number 1   Authorization - Number of Visits 10   PT Start Time 1435   PT Stop Time 1513   PT Time Calculation (min) 38 min   Equipment Utilized During Treatment Gait belt   Activity Tolerance Patient tolerated treatment well   Behavior During Therapy [facility] for tasks assessed/performed      Past Medical History  Diagnosis Date  . Pacemaker 08/29/2012    st jude accent DR RF device, model number [id number], serial number [id number], implanted 08/29/2012 for sinus bradycardia, runs of supraventricular tachycardia  last checked 10/23/2012  . ICD (implantable cardiac defibrillator) in place   . SSS (sick sinus syndrome) with PAT and marked bradycardia with 3 sec pauses  08/30/2012  . Status post placement of cardiac pacemaker. 08/29/12 St. Jude device 08/30/2012  . HTN (hypertension) 08/30/2012  . Hyperlipidemia 08/30/2012  . Polyneuropathy in other diseases classified elsewhere (HCC)   . Macular degeneration   . Coronary artery disease     stent in the LAD artery in 2002  . Venous insufficiency     Past Surgical History  Procedure Laterality Date  . Cardiac catheterization  2006    3 stents placed  . Insert / replace / remove pacemaker  08/29/2012    st jude   . Rotator cuff repair Left   . Abdominal hysterectomy    . Cesarean section    . Kidney stone surgery    . Rotator cuff repair Right   . Cardiac catheterization  06/2001    placement of  BiodivYsio 2.5.10mm stent dilated to 2.38mm in proximal left anterior descenting stenotic lesion  . Radiofrequency ablation  08/25/2010    ablation of left greater saphenous vein  . Hip arthroplasty Left 01/04/2014    Procedure: ARTHROPLASTY BIPOLAR HIP;  Surgeon: Shelda Pal, MD;  Location: [facility] OR;  Service: Orthopedics;  Laterality: Left;  . Permanent pacemaker insertion N/A 08/29/2012    Procedure: PERMANENT PACEMAKER INSERTION;  Surgeon: Thurmon Fair, MD;  Location: [facility] CATH LAB;  Service: Cardiovascular;  Laterality: N/A;  . Total hip revision Left 07/24/2015    Procedure: ORIF LEFT PERIPROSTHETIC FEMUR FRACTURE; CONVERSION TO TOTAL HIP ARTHROPLASTY POSS. FEMORAL COMPONENT REVISION ;  Surgeon: Samson Frederic, MD;  Location: [facility] OR;  Service: Orthopedics;  Laterality: Left;    There were no vitals filed for this visit.       Subjective Assessment - 12/23/15 1441    Subjective Patient reports that she had a L total hip replacement a couple of years ago, and fell, requiring a revision that was done in December of 2016; she reports she did well with HHPT but states that her MD wants her to do outpatient as well. She lost her youngest son 2 months ago due to MI.  Patient reports that she has had to be very careful  about falling as she does not want any more fractures. She is limited  in cooking or shopping, has caregivers that do taht for her and she would like to get back to this.    Pertinent History On beta-blockers, HTN, extensive cardiac history (stents and pacemaker), history of DVT, history of falls, osteroporsis, L hip replacement in 2015/revision in 2016 posterior approach, macular degeneration/poor vision   How long can you sit comfortably? not limited by hip    How long can you stand comfortably? 15 mintues    How long can you walk comfortably? with cane, maybe 100/2109ft    Patient Stated Goals get strength back, be able to walk without assistive device    Currently in Pain? Yes    Pain Score 6    Pain Location Knee   Pain Orientation Left   Pain Descriptors / Indicators Dull   Pain Type Chronic pain   Pain Radiating Towards none    Pain Onset More than a month ago   Pain Frequency Constant   Aggravating Factors  weight bearing    Pain Relieving Factors non weight bearing, elevation of limb    Effect of Pain on Daily Activities limits her activities but does not stop her from doing anything             [facility] PT Assessment - 12/23/15 0001    Assessment   Medical Diagnosis posterior hip replacement L    Referring Provider Samson Frederic    Onset Date/Surgical Date 07/24/15   Next MD Visit May 28th with Dr. Linna Caprice    Precautions   Precaution Comments L posterior hip    Balance Screen   Has the patient fallen in the past 6 months Yes   How many times? 1 that led to hip revision    Has the patient had a decrease in activity level because of a fear of falling?  No   Is the patient reluctant to leave their home because of a fear of falling?  No   Prior Function   Level of Independence Independent;Independent with basic ADLs;Requires assistive device for independence   Vocation Retired   Leisure no hobbies    Strength   Right Hip Flexion 3-/5   Right Hip ABduction 3/5   Left Hip Flexion --  DNT due to hip precautions    Left Hip ABduction 3-/5   Right Knee Flexion 4/5   Right Knee Extension 5/5   Left Knee Flexion 3+/5   Left Knee Extension 4/5   Right Ankle Dorsiflexion 5/5   Left Ankle Dorsiflexion 5/5   Transfers   Five time sit to stand comments  23.54 with intermittent UEs, poor eccentric control    Ambulation/Gait   Gait Comments reduced step lenghts B, shuffling gait pattern, reduced stance L LE, reduced heel-toe, flexed posture    6 minute walk test results    Aerobic Endurance Distance Walked 339   Endurance additional comments , SPC    High Level Balance   High Level Balance Comments TUG 25.86 with SPC                             PT Education - 12/23/15 1719    Education provided Yes   Education Details prognosis, posterior hip precautions, POC, keep up with HHPT HEP    Person(s) Educated Patient   Methods Explanation   Comprehension Verbalized understanding          PT Short Term Goals - 12/23/15 1730    PT SHORT  TERM GOAL #1   Title Patient to experience pain no more than 3/10 with all functional mobilty in order to improve overall QOL    Time 4   Period Weeks   Status New   PT SHORT TERM GOAL #2   Title Patient will demonstrate improved gait mechanics, including improved heel-toe sequence, full foot clearance with both LEs, minimal unsteadiness and shuffling, and improve gait speed in order to improve overall mobility and reduce fall risk    Time 4   Period Weeks   Status New   PT SHORT TERM GOAL #3   Title Patient to be independently able to list all 3 posterior hip precautions in order to maintain safety and integrity of L hip replacement during mobility    Time 4   Period Weeks   Status New   PT SHORT TERM GOAL #4   Title Patient to be independent in correctly and consistently performing appropriate HEP, to be updated PRN    Time 4   Period Weeks   Status New           PT Long Term Goals - 12/23/15 1734    PT LONG TERM GOAL #1   Title Patient to demonstrate strength at least 4/5 in all tested muscle groups in order to reduce pain, improve mobiltiy, and reduce fall risk    Time 8   Period Weeks   Status New   PT LONG TERM GOAL #2   Title Patient to be able to complete 5x sit to stand in 12 seconds or less in order to demonstrate improved B LE strength/power and improved mobiltiy    Time 8   Period Weeks   Status New   PT LONG TERM GOAL #3   Title Patient to be able to ambulate at least 91600ft during 6MWT in order to demonstrate improved gait tolerance and community mobiility    Time 8   Period Weeks   Status New   PT LONG TERM GOAL #4    Title Patient to be able to perform TUG test in 13 seconds with LRAD in order to demonstrate reduced fall risk    Time 8   Period Weeks   Status New               Plan - 12/23/15 1721    Clinical Impression Statement Patient arrives status-post revision of L posterior total hip, which was performed December 24th, 2016; per EPIC medical history her original surgery was done in June of 2015 and patient reports she needed the revision secondary to a fall. She current has caregivers at home, but reports she would really like to get back to full independence without an assistive device. Upon examination, patient reveals signficant functional weakness, gait impairment, reduced functional activity tolerance, impaired safety awareness, reduced muscular strength and power, and does remain a fall risk at this time. Reminded patient of general posteior hip precautions today as well as her WBAT status, also recommended that she keep up with HHPT HEP for now as per her description it still seems appropriate. At this point recommend skilled PT servies in order to address functional limtiations, improve overall functional performance, and reduce fall risk.    Rehab Potential Good   PT Frequency 2x / week   PT Duration 8 weeks   PT Treatment/Interventions ADLs/Self Care Home Management;Cryotherapy;DME Instruction;Gait training;Stair training;Functional mobility training;Therapeutic activities;Therapeutic exercise;Balance training;Neuromuscular re-education;Patient/family education;Manual techniques;Scar mobilization;Energy conservation;Taping   PT Next Visit Plan review initial eval and  goals; functional strengthening within precautions (see below for full list of MD precautions), balance and gait training, safety awareness. NO STRAIGHT LEG RAISES EVER PER MD.     PT Home Exercise Plan keeping up with HHPT for now    Recommended Other Services Per MD protocol: WBAT; hip precautions followed for 6 weeks for  posterior approach; STRAIGHT LEG RAISE PROHIBITED; ITB stretch allowed; rectus femoris stretch allowed; active ABD allowed; golfing and biking allowed per patient tolerance       Patient will benefit from skilled therapeutic intervention in order to improve the following deficits and impairments:  Abnormal gait, Decreased endurance, Decreased scar mobility, Decreased activity tolerance, Decreased strength, Pain, Decreased balance, Decreased mobility, Difficulty walking, Decreased coordination, Impaired flexibility, Decreased safety awareness  Visit Diagnosis: Pain in left hip - Plan: PT plan of care cert/re-cert  Muscle weakness (generalized) - Plan: PT plan of care cert/re-cert  Difficulty in walking, not elsewhere classified - Plan: PT plan of care cert/re-cert  Unsteadiness on feet - Plan: PT plan of care cert/re-cert  History of falling - Plan: PT plan of care cert/re-cert      G-Codes - 12-27-15 1736    Functional Assessment Tool Used Based on skilled clinical assessment of strength, gait, balance, functional activity tolerance, safety  (FOTO 51% limited)   Functional Limitation Mobility: Walking and moving around   Mobility: Walking and Moving Around Current Status (Z6109) At least 60 percent but less than 80 percent impaired, limited or restricted   Mobility: Walking and Moving Around Goal Status (U0454) At least 40 percent but less than 60 percent impaired, limited or restricted       Problem List Patient Active Problem List   Diagnosis Date Noted  . Urinary frequency 08/10/2015  . Postoperative anemia due to acute blood loss 07/27/2015  . SVT (supraventricular tachycardia) (HCC) 07/26/2015  . Femur fracture, left (HCC) 07/24/2015  . Chronic kidney disease 07/24/2015  . Periprosthetic fracture around internal prosthetic left hip joint (HCC) 07/24/2015  . Fall at home   . DVT (deep venous thrombosis) (HCC) 01/30/2014  . Occult blood positive stool 01/30/2014  .  Thrombocytopenia, unspecified (HCC) 01/06/2014  . Anemia, iron deficiency 01/06/2014  . Hip fracture, left (HCC) 01/03/2014  . QT prolongation 01/03/2014  . Atherosclerotic cardiovascular disease 08/13/2013  . Hypertensive emergency 04/08/2013  . Dizzy 04/08/2013  . PAT (paroxysmal atrial tachycardia) (HCC) 12/25/2012  . SSS (sick sinus syndrome) with PAT and marked bradycardia with 3 sec pauses  08/30/2012  . Status post placement of cardiac pacemaker. 08/29/12 St. Jude device 08/30/2012  . HTN (hypertension) 08/30/2012  . Hyperlipidemia 08/30/2012  . Osteoporosis, unspecified 04/19/2012  . Essential and other specified forms of tremor 04/19/2012  . Essential hypertension 04/19/2012  . Cardiac dysrhythmia, unspecified 04/19/2012  . Chronic ischemic heart disease 04/19/2012  . Hereditary and idiopathic peripheral neuropathy 04/19/2012  . Memory loss 04/19/2012  . Disturbance of skin sensation 04/19/2012  . Pain in joint, shoulder region 11/20/2011  . Status post complete repair of rotator cuff 11/20/2011  . Muscle weakness (generalized) 11/20/2011    Nedra Hai PT, DPT [phone]  [facility] [address] [place], [place], [address] Phone: [phone]   Fax:  [phone]  Name: Pamela [name] MRN: [id number] Date of Birth: 08-26-1920

## 2015-12-28 ENCOUNTER — Ambulatory Visit (HOSPITAL_COMMUNITY): Payer: Medicare Other | Admitting: Physical Therapy

## 2015-12-28 DIAGNOSIS — R262 Difficulty in walking, not elsewhere classified: Secondary | ICD-10-CM

## 2015-12-28 DIAGNOSIS — M25552 Pain in left hip: Secondary | ICD-10-CM

## 2015-12-28 DIAGNOSIS — R2681 Unsteadiness on feet: Secondary | ICD-10-CM

## 2015-12-28 DIAGNOSIS — M6281 Muscle weakness (generalized): Secondary | ICD-10-CM

## 2015-12-28 NOTE — Therapy (Signed)
[facility] [address] [place], [place], [address] Phone: [phone]   Fax:  [phone]  Physical Therapy Treatment  Patient Details  Name: Pamela [name] MRN: [id number] Date of Birth: Feb 08, 1921 Referring Provider: Samson FredericBrian Swinteck   Encounter Date: 12/28/2015      PT End of Session - 12/28/15 1433    Visit Number 2   Number of Visits 16   Date for PT Re-Evaluation 01/20/16   Authorization Type BCBS Medicare    Authorization Time Period 12/23/15 to 02/22/16   Authorization - Visit Number 2   Authorization - Number of Visits 10   PT Start Time 1350   PT Stop Time 1440   PT Time Calculation (min) 50 min   Equipment Utilized During Treatment Gait belt   Activity Tolerance Patient tolerated treatment well   Behavior During Therapy [facility] for tasks assessed/performed      Past Medical History  Diagnosis Date  . Pacemaker 08/29/2012    st jude accent DR RF device, model number [id number], serial number [id number], implanted 08/29/2012 for sinus bradycardia, runs of supraventricular tachycardia  last checked 10/23/2012  . ICD (implantable cardiac defibrillator) in place   . SSS (sick sinus syndrome) with PAT and marked bradycardia with 3 sec pauses  08/30/2012  . Status post placement of cardiac pacemaker. 08/29/12 St. Jude device 08/30/2012  . HTN (hypertension) 08/30/2012  . Hyperlipidemia 08/30/2012  . Polyneuropathy in other diseases classified elsewhere (HCC)   . Macular degeneration   . Coronary artery disease     stent in the LAD artery in 2002  . Venous insufficiency     Past Surgical History  Procedure Laterality Date  . Cardiac catheterization  2006    3 stents placed  . Insert / replace / remove pacemaker  08/29/2012    st jude   . Rotator cuff repair Left   . Abdominal hysterectomy    . Cesarean section    . Kidney stone surgery    . Rotator cuff repair Right   . Cardiac catheterization  06/2001    placement of BiodivYsio  2.5.10mm stent dilated to 2.5675mm in proximal left anterior descenting stenotic lesion  . Radiofrequency ablation  08/25/2010    ablation of left greater saphenous vein  . Hip arthroplasty Left 01/04/2014    Procedure: ARTHROPLASTY BIPOLAR HIP;  Surgeon: Shelda PalMatthew D Olin, MD;  [facility];  Service: Orthopedics;  Laterality: Left;  . Permanent pacemaker insertion N/A 08/29/2012    Procedure: PERMANENT PACEMAKER INSERTION;  Surgeon: Thurmon FairMihai Croitoru, MD;  Location: [facility] CATH LAB;  Service: Cardiovascular;  Laterality: N/A;  . Total hip revision Left 07/24/2015    Procedure: ORIF LEFT PERIPROSTHETIC FEMUR FRACTURE; CONVERSION TO TOTAL HIP ARTHROPLASTY POSS. FEMORAL COMPONENT REVISION ;  Surgeon: Samson FredericBrian Swinteck, MD;  [facility];  Service: Orthopedics;  Laterality: Left;    There were no vitals filed for this visit.      Subjective Assessment - 12/28/15 1400    Subjective Pt states she is doing well today.  Reports compliance with HEP given to her by HHPT   Currently in Pain? No/denies                         [facility] Adult [facility] Treatment/Exercise - 12/28/15 1431    Knee/Hip Exercises: Aerobic   Nustep 8 minutes level 2 hills 2 LE only   Knee/Hip Exercises: Standing   Heel Raises 10 reps  Heel Raises Limitations toeraises 10 reps   Hip Abduction Both;10 reps   Hip Extension Both;10 reps             Balance Exercises - 12/28/15 1415    Balance Exercises: Standing   Tandem Stance Eyes open;3 reps;25 secs   SLS 3 reps;Eyes open   Tandem Gait 2 reps   Retro Gait 2 reps   Sidestepping 2 reps   Cone Rotation Foam/compliant surface;Upper extremity assist 1;Right turn;Left turn;R/L   Cone Rotation Limitations 8 cones 1 UE only           PT Education - 12/28/15 1432    Education provided Yes   Education Details review of goals on initial evaluation.  Given a copy   Person(s) Educated Patient   Methods Explanation;Handout   Comprehension Verbalized understanding           PT Short Term Goals - 12/28/15 1519    PT SHORT TERM GOAL #1   Title Patient to experience pain no more than 3/10 with all functional mobilty in order to improve overall QOL    Time 4   Period Weeks   Status On-going   PT SHORT TERM GOAL #2   Title Patient will demonstrate improved gait mechanics, including improved heel-toe sequence, full foot clearance with both LEs, minimal unsteadiness and shuffling, and improve gait speed in order to improve overall mobility and reduce fall risk    Time 4   Period Weeks   Status On-going   PT SHORT TERM GOAL #3   Title Patient to be independently able to list all 3 posterior hip precautions in order to maintain safety and integrity of L hip replacement during mobility    Time 4   Period Weeks   Status On-going   PT SHORT TERM GOAL #4   Title Patient to be independent in correctly and consistently performing appropriate HEP, to be updated PRN    Time 4   Period Weeks   Status On-going           PT Long Term Goals - 12/28/15 1519    PT LONG TERM GOAL #1   Title Patient to demonstrate strength at least 4/5 in all tested muscle groups in order to reduce pain, improve mobiltiy, and reduce fall risk    Time 8   Period Weeks   Status On-going   PT LONG TERM GOAL #2   Title Patient to be able to complete 5x sit to stand in 12 seconds or less in order to demonstrate improved B LE strength/power and improved mobiltiy    Time 8   Period Weeks   Status On-going   PT LONG TERM GOAL #3   Title Patient to be able to ambulate at least 95ft during in order to demonstrate improved gait tolerance and community mobiility    Time 8   Period Weeks   Status On-going   PT LONG TERM GOAL #4   Title Patient to be able to perform TUG test in 13 seconds with LRAD in order to demonstrate reduced fall risk    Time 8   Period Weeks   Status On-going               Plan - 12/28/15 1433    Clinical Impression Statement Progressed  with balance activities this session to progress towards goals.  Pt with most difficulty with tandem gait and SLS.  Pt tends to make small choppy steps and with forward  posturing.  Pt requires multimodal cues during session to complete exercises correctly.  Ended session on nustep to encourage LE activity tolerance while reviewing goals.     Rehab Potential Good   PT Frequency 2x / week   PT Duration 8 weeks   PT Treatment/Interventions ADLs/Self Care Home Management;Cryotherapy;DME Instruction;Gait training;Stair training;Functional mobility training;Therapeutic activities;Therapeutic exercise;Balance training;Neuromuscular re-education;Patient/family education;Manual techniques;Scar mobilization;Energy conservation;Taping   PT Next Visit Plan Progress functional strengthening within precautions (see below for full list of MD precautions), balance and gait training, safety awareness. NO STRAIGHT LEG RAISES EVER PER MD.        Patient will benefit from skilled therapeutic intervention in order to improve the following deficits and impairments:  Abnormal gait, Decreased endurance, Decreased scar mobility, Decreased activity tolerance, Decreased strength, Pain, Decreased balance, Decreased mobility, Difficulty walking, Decreased coordination, Impaired flexibility, Decreased safety awareness  Visit Diagnosis: Pain in left hip  Muscle weakness (generalized)  Difficulty in walking, not elsewhere classified  Unsteadiness on feet     Problem List Patient Active Problem List   Diagnosis Date Noted  . Urinary frequency 08/10/2015  . Postoperative anemia due to acute blood loss 07/27/2015  . SVT (supraventricular tachycardia) (HCC) 07/26/2015  . Femur fracture, left (HCC) 07/24/2015  . Chronic kidney disease 07/24/2015  . Periprosthetic fracture around internal prosthetic left hip joint (HCC) 07/24/2015  . Fall at home   . DVT (deep venous thrombosis) (HCC) 01/30/2014  . Occult blood positive  stool 01/30/2014  . Thrombocytopenia, unspecified (HCC) 01/06/2014  . Anemia, iron deficiency 01/06/2014  . Hip fracture, left (HCC) 01/03/2014  . QT prolongation 01/03/2014  . Atherosclerotic cardiovascular disease 08/13/2013  . Hypertensive emergency 04/08/2013  . Dizzy 04/08/2013  . PAT (paroxysmal atrial tachycardia) (HCC) 12/25/2012  . SSS (sick sinus syndrome) with PAT and marked bradycardia with 3 sec pauses  08/30/2012  . Status post placement of cardiac pacemaker. 08/29/12 St. Jude device 08/30/2012  . HTN (hypertension) 08/30/2012  . Hyperlipidemia 08/30/2012  . Osteoporosis, unspecified 04/19/2012  . Essential and other specified forms of tremor 04/19/2012  . Essential hypertension 04/19/2012  . Cardiac dysrhythmia, unspecified 04/19/2012  . Chronic ischemic heart disease 04/19/2012  . Hereditary and idiopathic peripheral neuropathy 04/19/2012  . Memory loss 04/19/2012  . Disturbance of skin sensation 04/19/2012  . Pain in joint, shoulder region 11/20/2011  . Status post complete repair of rotator cuff 11/20/2011  . Muscle weakness (generalized) 11/20/2011    Pamela [name], PTA/CLT [phone]  12/28/2015, 3:22 PM  [facility] [address] [place], [place], [address] Phone: [phone]   Fax:  [phone]  Name: Pamela [name] MRN: [id number] Date of Birth: 09-14-1920

## 2015-12-30 ENCOUNTER — Telehealth (HOSPITAL_COMMUNITY): Payer: Self-pay | Admitting: Physical Therapy

## 2015-12-30 ENCOUNTER — Ambulatory Visit (HOSPITAL_COMMUNITY): Payer: Medicare Other | Admitting: Physical Therapy

## 2015-12-30 NOTE — Telephone Encounter (Signed)
Patient a no-show for today's session. Called patient, who reported she thought she had cancelled this appointment in person after her last session. Will count today's as a cancellation rather than no-show. Confirmed date/time of next scheduled session with patient, who reports she plans to be here.  Nedra HaiKristen Unger PT, DPT [phone]

## 2016-01-11 ENCOUNTER — Ambulatory Visit (HOSPITAL_COMMUNITY): Payer: Medicare Other | Attending: Orthopedic Surgery

## 2016-01-11 DIAGNOSIS — Z9181 History of falling: Secondary | ICD-10-CM

## 2016-01-11 DIAGNOSIS — M6281 Muscle weakness (generalized): Secondary | ICD-10-CM

## 2016-01-11 DIAGNOSIS — M25552 Pain in left hip: Secondary | ICD-10-CM | POA: Insufficient documentation

## 2016-01-11 DIAGNOSIS — R262 Difficulty in walking, not elsewhere classified: Secondary | ICD-10-CM | POA: Insufficient documentation

## 2016-01-11 DIAGNOSIS — R2681 Unsteadiness on feet: Secondary | ICD-10-CM

## 2016-01-11 NOTE — Therapy (Signed)
[facility] [address] [place], [place], [address] Phone: [phone]   Fax:  [phone]  Physical Therapy Treatment  Patient Details  Name: Pamela [name] MRN: [id number] Date of Birth: 08-06-1920 Referring Provider: Samson Frederic  Encounter Date: 01/11/2016      PT End of Session - 01/11/16 1125    Visit Number 3   Number of Visits 16   Date for PT Re-Evaluation 01/20/16   Authorization Type BCBS Medicare    Authorization Time Period 12/23/15 to 02/22/16   Authorization - Visit Number 3   Authorization - Number of Visits 10   PT Start Time 1122   PT Stop Time 1211   PT Time Calculation (min) 49 min   Equipment Utilized During Treatment Gait belt   Activity Tolerance Patient tolerated treatment well   Behavior During Therapy [facility] for tasks assessed/performed      Past Medical History  Diagnosis Date  . Pacemaker 08/29/2012    st jude accent DR RF device, model number [id number], serial number [id number], implanted 08/29/2012 for sinus bradycardia, runs of supraventricular tachycardia  last checked 10/23/2012  . ICD (implantable cardiac defibrillator) in place   . SSS (sick sinus syndrome) with PAT and marked bradycardia with 3 sec pauses  08/30/2012  . Status post placement of cardiac pacemaker. 08/29/12 St. Jude device 08/30/2012  . HTN (hypertension) 08/30/2012  . Hyperlipidemia 08/30/2012  . Polyneuropathy in other diseases classified elsewhere (HCC)   . Macular degeneration   . Coronary artery disease     stent in the LAD artery in 2002  . Venous insufficiency     Past Surgical History  Procedure Laterality Date  . Cardiac catheterization  2006    3 stents placed  . Insert / replace / remove pacemaker  08/29/2012    st jude   . Rotator cuff repair Left   . Abdominal hysterectomy    . Cesarean section    . Kidney stone surgery    . Rotator cuff repair Right   . Cardiac catheterization  06/2001    placement of BiodivYsio  2.5.10mm stent dilated to 2.34mm in proximal left anterior descenting stenotic lesion  . Radiofrequency ablation  08/25/2010    ablation of left greater saphenous vein  . Hip arthroplasty Left 01/04/2014    Procedure: ARTHROPLASTY BIPOLAR HIP;  Surgeon: Shelda Pal, MD;  Location: [facility] OR;  Service: Orthopedics;  Laterality: Left;  . Permanent pacemaker insertion N/A 08/29/2012    Procedure: PERMANENT PACEMAKER INSERTION;  Surgeon: Thurmon Fair, MD;  Location: [facility] CATH LAB;  Service: Cardiovascular;  Laterality: N/A;  . Total hip revision Left 07/24/2015    Procedure: ORIF LEFT PERIPROSTHETIC FEMUR FRACTURE; CONVERSION TO TOTAL HIP ARTHROPLASTY POSS. FEMORAL COMPONENT REVISION ;  Surgeon: Samson Frederic, MD;  Location: [facility] OR;  Service: Orthopedics;  Laterality: Left;    There were no vitals filed for this visit.      Subjective Assessment - 01/11/16 1119    Subjective Pt stated she is feeling great, no reports of pain today.  Reports compliance with HEP.  Feels her balance most difficulty currently   Pertinent History On beta-blockers, HTN, extensive cardiac history (stents and pacemaker), history of DVT, history of falls, osteroporsis, L hip replacement in 2015/revision in 2016 posterior approach    Patient Stated Goals get strength back, be able to walk without assistive device    Currently in Pain? No/denies  [facility] - 01/11/16 0001    Assessment   Medical Diagnosis posterior hip replacement L    Referring Provider Samson FredericBrian Swinteck   Onset Date/Surgical Date 07/24/15   Next MD Visit 01/26/2016   Precautions   Precautions Other (comment)  No SLR per MD   Precaution Comments L posterior hip              [facility] Adult PT Treatment/Exercise - 01/11/16 0001    Knee/Hip Exercises: Standing   Heel Raises 15 reps   Heel Raises Limitations toeraises 15 reps   Hip Abduction Both;15 reps   Abduction Limitations 2 second holds; cueing for form   Hip Extension  Both;10 reps   Functional Squat 10 reps   Functional Squat Limitations cueing for form             Balance Exercises - 01/11/16 1151    Balance Exercises: Standing   Tandem Stance Eyes open;3 reps;30 secs   SLS 3 reps;Eyes open   Rockerboard Lateral;UE support  2 min R/L   Balance Beam Begin next session   Tandem Gait 2 reps   Retro Gait 2 reps   Sidestepping 2 reps;Theraband  RTB   Cone Rotation Foam/compliant surface;Upper extremity assist 1;Right turn;Left turn;R/L   Cone Rotation Limitations 10 cones no UE A             PT Short Term Goals - 12/28/15 1519    PT SHORT TERM GOAL #1   Title Patient to experience pain no more than 3/10 with all functional mobilty in order to improve overall QOL    Time 4   Period Weeks   Status On-going   PT SHORT TERM GOAL #2   Title Patient will demonstrate improved gait mechanics, including improved heel-toe sequence, full foot clearance with both LEs, minimal unsteadiness and shuffling, and improve gait speed in order to improve overall mobility and reduce fall risk    Time 4   Period Weeks   Status On-going   PT SHORT TERM GOAL #3   Title Patient to be independently able to list all 3 posterior hip precautions in order to maintain safety and integrity of L hip replacement during mobility    Time 4   Period Weeks   Status On-going   PT SHORT TERM GOAL #4   Title Patient to be independent in correctly and consistently performing appropriate HEP, to be updated PRN    Time 4   Period Weeks   Status On-going           PT Long Term Goals - 12/28/15 1519    PT LONG TERM GOAL #1   Title Patient to demonstrate strength at least 4/5 in all tested muscle groups in order to reduce pain, improve mobiltiy, and reduce fall risk    Time 8   Period Weeks   Status On-going   PT LONG TERM GOAL #2   Title Patient to be able to complete 5x sit to stand in 12 seconds or less in order to demonstrate improved B LE strength/power and  improved mobiltiy    Time 8   Period Weeks   Status On-going   PT LONG TERM GOAL #3   Title Patient to be able to ambulate at least 94700ft during 6MWT in order to demonstrate improved gait tolerance and community mobiility    Time 8   Period Weeks   Status On-going   PT LONG TERM GOAL #4   Title Patient  to be able to perform TUG test in 13 seconds with LRAD in order to demonstrate reduced fall risk    Time 8   Period Weeks   Status On-going               Plan - 01/11/16 1208    Clinical Impression Statement Session focus on improving functional strengthening for proximal hip musculature and balance training.  Began functional squats wtih cueing for form and added resistance with sidestepping for glut med strenghtening and balance training.  3 LOB episodes during tandem gait and retro requiring assistance for safety.  Pt tends to make small choppy steps with forward posture, multimodal cueing to improve posture and increase stride length through session.  Ended session with nustep for LE strengthening and to improve activity tolerance, unsupervised free of charge.     Rehab Potential Good   PT Frequency 2x / week   PT Duration 8 weeks   PT Treatment/Interventions ADLs/Self Care Home Management;Cryotherapy;DME Instruction;Gait training;Stair training;Functional mobility training;Therapeutic activities;Therapeutic exercise;Balance training;Neuromuscular re-education;Patient/family education;Manual techniques;Scar mobilization;Energy conservation;Taping   PT Next Visit Plan Progress functional strengthening within precautions (see below for full list of MD precautions), balance and gait training, safety awareness. NO STRAIGHT LEG RAISES EVER PER MD.     Recommended Other Services Per MD protocol: WBAT; hip precautions followed for 6 weeks for posterior approach; STRAIGHT LEG RAISE PROHIBITED; ITB stretch allowed; rectus femoris stretch allowed; active ABD allowed; golfing and biking allowed  per patient tolerance       Patient will benefit from skilled therapeutic intervention in order to improve the following deficits and impairments:  Abnormal gait, Decreased endurance, Decreased scar mobility, Decreased activity tolerance, Decreased strength, Pain, Decreased balance, Decreased mobility, Difficulty walking, Decreased coordination, Impaired flexibility, Decreased safety awareness  Visit Diagnosis: Pain in left hip  Muscle weakness (generalized)  Difficulty in walking, not elsewhere classified  Unsteadiness on feet  History of falling     Problem List Patient Active Problem List   Diagnosis Date Noted  . Urinary frequency 08/10/2015  . Postoperative anemia due to acute blood loss 07/27/2015  . SVT (supraventricular tachycardia) (HCC) 07/26/2015  . Femur fracture, left (HCC) 07/24/2015  . Chronic kidney disease 07/24/2015  . Periprosthetic fracture around internal prosthetic left hip joint (HCC) 07/24/2015  . Fall at home   . DVT (deep venous thrombosis) (HCC) 01/30/2014  . Occult blood positive stool 01/30/2014  . Thrombocytopenia, unspecified (HCC) 01/06/2014  . Anemia, iron deficiency 01/06/2014  . Hip fracture, left (HCC) 01/03/2014  . QT prolongation 01/03/2014  . Atherosclerotic cardiovascular disease 08/13/2013  . Hypertensive emergency 04/08/2013  . Dizzy 04/08/2013  . PAT (paroxysmal atrial tachycardia) (HCC) 12/25/2012  . SSS (sick sinus syndrome) with PAT and marked bradycardia with 3 sec pauses  08/30/2012  . Status post placement of cardiac pacemaker. 08/29/12 St. Jude device 08/30/2012  . HTN (hypertension) 08/30/2012  . Hyperlipidemia 08/30/2012  . Osteoporosis, unspecified 04/19/2012  . Essential and other specified forms of tremor 04/19/2012  . Essential hypertension 04/19/2012  . Cardiac dysrhythmia, unspecified 04/19/2012  . Chronic ischemic heart disease 04/19/2012  . Hereditary and idiopathic peripheral neuropathy 04/19/2012  . Memory  loss 04/19/2012  . Disturbance of skin sensation 04/19/2012  . Pain in joint, shoulder region 11/20/2011  . Status post complete repair of rotator cuff 11/20/2011  . Muscle weakness (generalized) 11/20/2011   Becky Sax, LPTA; CBIS [phone]  Juel Burrow 01/11/2016, 12:13 PM  [facility]  [address] [place], [place], [address] Phone: [phone]   Fax:  [phone]  Name: Pamela [name] MRN: [id number] Date of Birth: June 13, 1921

## 2016-01-13 ENCOUNTER — Ambulatory Visit (HOSPITAL_COMMUNITY): Payer: Medicare Other

## 2016-01-13 DIAGNOSIS — R262 Difficulty in walking, not elsewhere classified: Secondary | ICD-10-CM

## 2016-01-13 DIAGNOSIS — R2681 Unsteadiness on feet: Secondary | ICD-10-CM

## 2016-01-13 DIAGNOSIS — M25552 Pain in left hip: Secondary | ICD-10-CM

## 2016-01-13 DIAGNOSIS — Z9181 History of falling: Secondary | ICD-10-CM

## 2016-01-13 DIAGNOSIS — M6281 Muscle weakness (generalized): Secondary | ICD-10-CM

## 2016-01-13 NOTE — Therapy (Signed)
[facility] [address] [place], [place], [address] Phone: [phone]   Fax:  [phone]  Physical Therapy Treatment  Patient Details  Name: Pamela [name] N Legaspi MRN: [id number] Date of Birth: 1921-04-04 Referring Provider: Samson FredericBrian Swinteck  Encounter Date: 01/13/2016      PT End of Session - 01/13/16 1349    Visit Number 4   Number of Visits 16   Date for PT Re-Evaluation 01/20/16   Authorization Type BCBS Medicare    Authorization Time Period 12/23/15 to 02/22/16   Authorization - Visit Number 4   Authorization - Number of Visits 10   PT Start Time 1300   PT Stop Time 1343   PT Time Calculation (min) 43 min   Equipment Utilized During Treatment Gait belt   Activity Tolerance Patient tolerated treatment well   Behavior During Therapy [facility] for tasks assessed/performed      Past Medical History  Diagnosis Date  . Pacemaker 08/29/2012    st jude accent DR RF device, model number [id number], serial number [id number], implanted 08/29/2012 for sinus bradycardia, runs of supraventricular tachycardia  last checked 10/23/2012  . ICD (implantable cardiac defibrillator) in place   . SSS (sick sinus syndrome) with PAT and marked bradycardia with 3 sec pauses  08/30/2012  . Status post placement of cardiac pacemaker. 08/29/12 St. Jude device 08/30/2012  . HTN (hypertension) 08/30/2012  . Hyperlipidemia 08/30/2012  . Polyneuropathy in other diseases classified elsewhere (HCC)   . Macular degeneration   . Coronary artery disease     stent in the LAD artery in 2002  . Venous insufficiency     Past Surgical History  Procedure Laterality Date  . Cardiac catheterization  2006    3 stents placed  . Insert / replace / remove pacemaker  08/29/2012    st jude   . Rotator cuff repair Left   . Abdominal hysterectomy    . Cesarean section    . Kidney stone surgery    . Rotator cuff repair Right   . Cardiac catheterization  06/2001    placement of BiodivYsio  2.5.10mm stent dilated to 2.7575mm in proximal left anterior descenting stenotic lesion  . Radiofrequency ablation  08/25/2010    ablation of left greater saphenous vein  . Hip arthroplasty Left 01/04/2014    Procedure: ARTHROPLASTY BIPOLAR HIP;  Surgeon: Shelda PalMatthew D Olin, MD;  [facility];  Service: Orthopedics;  Laterality: Left;  . Permanent pacemaker insertion N/A 08/29/2012    Procedure: PERMANENT PACEMAKER INSERTION;  Surgeon: Thurmon FairMihai Croitoru, MD;  Location: [facility] CATH LAB;  Service: Cardiovascular;  Laterality: N/A;  . Total hip revision Left 07/24/2015    Procedure: ORIF LEFT PERIPROSTHETIC FEMUR FRACTURE; CONVERSION TO TOTAL HIP ARTHROPLASTY POSS. FEMORAL COMPONENT REVISION ;  Surgeon: Samson FredericBrian Swinteck, MD;  [facility];  Service: Orthopedics;  Laterality: Left;    There were no vitals filed for this visit.      Subjective Assessment - 01/13/16 1302    Subjective Pt stated she is sore today, no reports of pain Lt hip though does c/o pain Lt side lower back.  Feels her balance currently most difficult   Pertinent History On beta-blockers, HTN, extensive cardiac history (stents and pacemaker), history of DVT, history of falls, osteroporsis, L hip replacement in 2015/revision in 2016 posterior approach    Patient Stated Goals get strength back, be able to walk without assistive device    Currently in Pain? Yes   Pain Score 4  Pain Location Back   Pain Orientation Lower;Left   Pain Descriptors / Indicators Sore   Pain Type Chronic pain   Pain Radiating Towards none   Pain Onset More than a month ago   Pain Frequency Constant   Aggravating Factors  weight bearing   Pain Relieving Factors non weight bearing, elevation of limb   Effect of Pain on Daily Activities limits her activities but does not stop her from doing anything               [facility] Adult PT Treatment/Exercise - 01/13/16 0001    Knee/Hip Exercises: Standing   Heel Raises 15 reps   Heel Raises Limitations  toeraises 15 reps   Hip Abduction Both;2 sets;10 reps   Abduction Limitations 2 second holds; cueing for form   Functional Squat 10 reps   Functional Squat Limitations cueing for form   Rocker Board 2 minutes  R/L and A/P   Gait Training 452 feet with cueing for heel to toe and to increase stride length             Balance Exercises - 01/13/16 1324    Balance Exercises: Standing   Tandem Stance Eyes open;3 reps;30 secs;Foam/compliant surface   SLS 3 reps;Eyes open  Lt 5", Rt 3" max of 3   Rockerboard Anterior/posterior;Lateral   Balance Beam Tandem and sidestepping   Marching Limitations 10x 3" alternating on airex             PT Short Term Goals - 12/28/15 1519    PT SHORT TERM GOAL #1   Title Patient to experience pain no more than 3/10 with all functional mobilty in order to improve overall QOL    Time 4   Period Weeks   Status On-going   PT SHORT TERM GOAL #2   Title Patient will demonstrate improved gait mechanics, including improved heel-toe sequence, full foot clearance with both LEs, minimal unsteadiness and shuffling, and improve gait speed in order to improve overall mobility and reduce fall risk    Time 4   Period Weeks   Status On-going   PT SHORT TERM GOAL #3   Title Patient to be independently able to list all 3 posterior hip precautions in order to maintain safety and integrity of L hip replacement during mobility    Time 4   Period Weeks   Status On-going   PT SHORT TERM GOAL #4   Title Patient to be independent in correctly and consistently performing appropriate HEP, to be updated PRN    Time 4   Period Weeks   Status On-going           PT Long Term Goals - 12/28/15 1519    PT LONG TERM GOAL #1   Title Patient to demonstrate strength at least 4/5 in all tested muscle groups in order to reduce pain, improve mobiltiy, and reduce fall risk    Time 8   Period Weeks   Status On-going   PT LONG TERM GOAL #2   Title Patient to be able to  complete 5x sit to stand in 12 seconds or less in order to demonstrate improved B LE strength/power and improved mobiltiy    Time 8   Period Weeks   Status On-going   PT LONG TERM GOAL #3   Title Patient to be able to ambulate at least 945ft during in order to demonstrate improved gait tolerance and community mobiility    Time 8  Period Weeks   Status On-going   PT LONG TERM GOAL #4   Title Patient to be able to perform TUG test in 13 seconds with LRAD in order to demonstrate reduced fall risk    Time 8   Period Weeks   Status On-going               Plan - 01/13/16 1850    Clinical Impression Statement Session focus on improving gait mechanics, functional strengthening for proximal musculature and progressing balance training.  Pt continues to exhibit small choppy steps with forward posture, gait training complete with multimodal cueing to improve heel to toe pattern and increase stride length.  Pt stated she began taking smaller steps due to fear of falling following injury.  Pt educated on importance of improving gait mechanics to reduce risk of fall.  Progressed to dynamic surface with balance training with min A required and cueing to improve spatial awareness that improved balance.  No reports of increased pain at end of session.     Rehab Potential Good   PT Frequency 2x / week   PT Duration 8 weeks   PT Treatment/Interventions ADLs/Self Care Home Management;Cryotherapy;DME Instruction;Gait training;Stair training;Functional mobility training;Therapeutic activities;Therapeutic exercise;Balance training;Neuromuscular re-education;Patient/family education;Manual techniques;Scar mobilization;Energy conservation;Taping   PT Next Visit Plan Progress functional strengthening within precautions (see below for full list of MD precautions), balance and gait training, safety awareness. NO STRAIGHT LEG RAISES EVER PER MD.     Recommended Other Services Per MD protocol: WBAT; hip  precautions followed for 6 weeks for posterior approach; STRAIGHT LEG RAISE PROHIBITED; ITB stretch allowed; rectus femoris stretch allowed; active ABD allowed; golfing and biking allowed per patient tolerance      Patient will benefit from skilled therapeutic intervention in order to improve the following deficits and impairments:  Abnormal gait, Decreased endurance, Decreased scar mobility, Decreased activity tolerance, Decreased strength, Pain, Decreased balance, Decreased mobility, Difficulty walking, Decreased coordination, Impaired flexibility, Decreased safety awareness  Visit Diagnosis: Pain in left hip  Muscle weakness (generalized)  Difficulty in walking, not elsewhere classified  Unsteadiness on feet  History of falling     Problem List Patient Active Problem List   Diagnosis Date Noted  . Urinary frequency 08/10/2015  . Postoperative anemia due to acute blood loss 07/27/2015  . SVT (supraventricular tachycardia) (HCC) 07/26/2015  . Femur fracture, left (HCC) 07/24/2015  . Chronic kidney disease 07/24/2015  . Periprosthetic fracture around internal prosthetic left hip joint (HCC) 07/24/2015  . Fall at home   . DVT (deep venous thrombosis) (HCC) 01/30/2014  . Occult blood positive stool 01/30/2014  . Thrombocytopenia, unspecified (HCC) 01/06/2014  . Anemia, iron deficiency 01/06/2014  . Hip fracture, left (HCC) 01/03/2014  . QT prolongation 01/03/2014  . Atherosclerotic cardiovascular disease 08/13/2013  . Hypertensive emergency 04/08/2013  . Dizzy 04/08/2013  . PAT (paroxysmal atrial tachycardia) (HCC) 12/25/2012  . SSS (sick sinus syndrome) with PAT and marked bradycardia with 3 sec pauses  08/30/2012  . Status post placement of cardiac pacemaker. 08/29/12 St. Jude device 08/30/2012  . HTN (hypertension) 08/30/2012  . Hyperlipidemia 08/30/2012  . Osteoporosis, unspecified 04/19/2012  . Essential and other specified forms of tremor 04/19/2012  . Essential  hypertension 04/19/2012  . Cardiac dysrhythmia, unspecified 04/19/2012  . Chronic ischemic heart disease 04/19/2012  . Hereditary and idiopathic peripheral neuropathy 04/19/2012  . Memory loss 04/19/2012  . Disturbance of skin sensation 04/19/2012  . Pain in joint, shoulder region 11/20/2011  . Status post complete  repair of rotator cuff 11/20/2011  . Muscle weakness (generalized) 11/20/2011   Becky Sax, LPTA; CBIS [phone]  Juel Burrow 01/13/2016, 6:57 PM  [facility] [address] [place], [place], [address] Phone: [phone]   Fax:  [phone]  Name: RENESME KERRIGAN MRN: [id number] Date of Birth: June 08, 1921

## 2016-01-17 ENCOUNTER — Telehealth: Payer: Self-pay | Admitting: Adult Health

## 2016-01-17 NOTE — Telephone Encounter (Signed)
Patient calling regarding possible medication reaction. Drug:gabapentin (NEURONTIN) 100 MG capsule Start:  May 26th/ 27th Symptoms: blurred vision , weakness in eyes.

## 2016-01-17 NOTE — Telephone Encounter (Signed)
TC to pt. She stated that she started the gabapentin 200mg  po bedtime and noted blurred vision, both eyes, L eye worsed then R.  She took this dosing for 3-4 days.  She stopped and and has now been off the medication totally for over a week now.  Her vision is better, still with some residual blurriness.  No other sx.  She is asking for something else.  I relayed that I would ask, but she may need appt to dicuss.

## 2016-01-18 ENCOUNTER — Ambulatory Visit (HOSPITAL_COMMUNITY): Payer: Medicare Other

## 2016-01-18 DIAGNOSIS — M25552 Pain in left hip: Secondary | ICD-10-CM | POA: Diagnosis not present

## 2016-01-18 DIAGNOSIS — R262 Difficulty in walking, not elsewhere classified: Secondary | ICD-10-CM

## 2016-01-18 DIAGNOSIS — R2681 Unsteadiness on feet: Secondary | ICD-10-CM

## 2016-01-18 DIAGNOSIS — Z9181 History of falling: Secondary | ICD-10-CM

## 2016-01-18 DIAGNOSIS — M6281 Muscle weakness (generalized): Secondary | ICD-10-CM

## 2016-01-18 NOTE — Telephone Encounter (Signed)
I called the patient. No answer. Left message for her to call office.

## 2016-01-18 NOTE — Telephone Encounter (Signed)
Pt returned Megan's call. °

## 2016-01-18 NOTE — Telephone Encounter (Signed)
Pt returned call. Please call back.

## 2016-01-18 NOTE — Telephone Encounter (Signed)
I called the patient. No answer. Left message asking her to call the office.

## 2016-01-18 NOTE — Telephone Encounter (Signed)
I called the patient. She states that the gabapentin caused blurry vision. She has stopped the medication. She states that her vision is somewhat better. I advised that if she continues to have blurry vision she should follow-up with her primary care for her ophthalmologist. I will give the patient a prescription for compounded cream to help with her discomfort.

## 2016-01-18 NOTE — Therapy (Signed)
[facility] [address] [place], [place], [address] Phone: [phone]   Fax:  [phone]  Physical Therapy Treatment  Patient Details  Name: Pamela [name] MRN: [id number] Date of Birth: 04-04-1921 Referring Provider: Samson Frederic  Encounter Date: 01/18/2016      PT End of Session - 01/18/16 1311    Visit Number 5   Number of Visits 16   Date for PT Re-Evaluation 01/20/16   Authorization Type BCBS Medicare    Authorization Time Period 12/23/15 to 02/22/16   Authorization - Visit Number 5   Authorization - Number of Visits 10   PT Start Time 1300   PT Stop Time 1343   PT Time Calculation (min) 43 min   Equipment Utilized During Treatment Gait belt   Activity Tolerance Patient tolerated treatment well   Behavior During Therapy [facility] for tasks assessed/performed      Past Medical History  Diagnosis Date  . Pacemaker 08/29/2012    st jude accent DR RF device, model number [id number], serial number [id number], implanted 08/29/2012 for sinus bradycardia, runs of supraventricular tachycardia  last checked 10/23/2012  . ICD (implantable cardiac defibrillator) in place   . SSS (sick sinus syndrome) with PAT and marked bradycardia with 3 sec pauses  08/30/2012  . Status post placement of cardiac pacemaker. 08/29/12 St. Jude device 08/30/2012  . HTN (hypertension) 08/30/2012  . Hyperlipidemia 08/30/2012  . Polyneuropathy in other diseases classified elsewhere (HCC)   . Macular degeneration   . Coronary artery disease     stent in the LAD artery in 2002  . Venous insufficiency     Past Surgical History  Procedure Laterality Date  . Cardiac catheterization  2006    3 stents placed  . Insert / replace / remove pacemaker  08/29/2012    st jude   . Rotator cuff repair Left   . Abdominal hysterectomy    . Cesarean section    . Kidney stone surgery    . Rotator cuff repair Right   . Cardiac catheterization  06/2001    placement of BiodivYsio  2.5.10mm stent dilated to 2.67mm in proximal left anterior descenting stenotic lesion  . Radiofrequency ablation  08/25/2010    ablation of left greater saphenous vein  . Hip arthroplasty Left 01/04/2014    Procedure: ARTHROPLASTY BIPOLAR HIP;  Surgeon: Shelda Pal, MD;  Location: [facility] OR;  Service: Orthopedics;  Laterality: Left;  . Permanent pacemaker insertion N/A 08/29/2012    Procedure: PERMANENT PACEMAKER INSERTION;  Surgeon: Thurmon Fair, MD;  Location: [facility] CATH LAB;  Service: Cardiovascular;  Laterality: N/A;  . Total hip revision Left 07/24/2015    Procedure: ORIF LEFT PERIPROSTHETIC FEMUR FRACTURE; CONVERSION TO TOTAL HIP ARTHROPLASTY POSS. FEMORAL COMPONENT REVISION ;  Surgeon: Samson Frederic, MD;  Location: [facility] OR;  Service: Orthopedics;  Laterality: Left;    There were no vitals filed for this visit.      Subjective Assessment - 01/18/16 1309    Subjective Pt feels the weather is a factor relating to pain, pain scale 4/10 lower back.     Pertinent History On beta-blockers, HTN, extensive cardiac history (stents and pacemaker), history of DVT, history of falls, osteroporsis, L hip replacement in 2015/revision in 2016 posterior approach    Patient Stated Goals get strength back, be able to walk without assistive device    Currently in Pain? Yes   Pain Score 4    Pain Location Back   Pain Orientation  Lower;Left   Pain Descriptors / Indicators Sore   Pain Type Chronic pain   Pain Radiating Towards none   Pain Onset More than a month ago   Pain Frequency Constant   Aggravating Factors  weight bearing   Pain Relieving Factors non- weight bearing, elevated limb   Effect of Pain on Daily Activities limits her activities but does not stop her from doing anything               [facility] Adult PT Treatment/Exercise - 01/18/16 0001    Knee/Hip Exercises: Standing   Heel Raises 20 reps   Heel Raises Limitations toeraises 20 reps   Forward Step Up Left;15 reps;Hand Hold:  1;Limitations   Forward Step Up Limitations 7 in step height   Step Down Left;10 reps;Hand Hold: 1;Hand Hold: 2;Step Height: 4"   Functional Squat 15 reps   Functional Squat Limitations cueing for form   Stairs reciprocal ascend and descend 4in, ascend 7in descend 4in then 7in reciprocal up and down    Rocker Board 2 minutes  R/L with UE A   Gait Training 452 feet with cueing for heel to toe and to increase stride length             Balance Exercises - 01/18/16 1346    Balance Exercises: Standing   Tandem Stance Eyes open;3 reps;30 secs;Foam/compliant surface   SLS 3 reps;Eyes open  Lt 4", Rt 3"    Rockerboard Lateral   Balance Beam Tandem and sidestepping 2RT   Retro Gait 2 reps   Sidestepping 2 reps;Theraband  RTB   Marching Limitations 10x 3" alternating on airex             PT Short Term Goals - 12/28/15 1519    PT SHORT TERM GOAL #1   Title Patient to experience pain no more than 3/10 with all functional mobilty in order to improve overall QOL    Time 4   Period Weeks   Status On-going   PT SHORT TERM GOAL #2   Title Patient will demonstrate improved gait mechanics, including improved heel-toe sequence, full foot clearance with both LEs, minimal unsteadiness and shuffling, and improve gait speed in order to improve overall mobility and reduce fall risk    Time 4   Period Weeks   Status On-going   PT SHORT TERM GOAL #3   Title Patient to be independently able to list all 3 posterior hip precautions in order to maintain safety and integrity of L hip replacement during mobility    Time 4   Period Weeks   Status On-going   PT SHORT TERM GOAL #4   Title Patient to be independent in correctly and consistently performing appropriate HEP, to be updated PRN    Time 4   Period Weeks   Status On-going           PT Long Term Goals - 12/28/15 1519    PT LONG TERM GOAL #1   Title Patient to demonstrate strength at least 4/5 in all tested muscle groups in order  to reduce pain, improve mobiltiy, and reduce fall risk    Time 8   Period Weeks   Status On-going   PT LONG TERM GOAL #2   Title Patient to be able to complete 5x sit to stand in 12 seconds or less in order to demonstrate improved B LE strength/power and improved mobiltiy    Time 8   Period Weeks   Status On-going  PT LONG TERM GOAL #3   Title Patient to be able to ambulate at least 971ft during in order to demonstrate improved gait tolerance and community mobiility    Time 8   Period Weeks   Status On-going   PT LONG TERM GOAL #4   Title Patient to be able to perform TUG test in 13 seconds with LRAD in order to demonstrate reduced fall risk    Time 8   Period Weeks   Status On-going               Plan - 01/18/16 1347    Clinical Impression Statement Pt improving gait mechanics with less cueing to increase stride lenght.  Added stair training to improve functional strengthening.  Continued balance training with therapist facilitaiton for safety to reduce risk of fall.  No reports of increased pain through session, pt was limited by fatigue with activities.     Rehab Potential Good   PT Frequency 2x / week   PT Duration 8 weeks   PT Treatment/Interventions ADLs/Self Care Home Management;Cryotherapy;DME Instruction;Gait training;Stair training;Functional mobility training;Therapeutic activities;Therapeutic exercise;Balance training;Neuromuscular re-education;Patient/family education;Manual techniques;Scar mobilization;Energy conservation;Taping   PT Next Visit Plan Progress functional strengthening within precautions (see below for full list of MD precautions), balance and gait training, safety awareness. NO STRAIGHT LEG RAISES EVER PER MD.  Begin forward lunges next session   Recommended Other Services Per MD protocol: WBAT; hip precautions followed for 6 weeks for posterior approach; STRAIGHT LEG RAISE PROHIBITED; ITB stretch allowed; rectus femoris stretch allowed; active  ABD allowed; golfing and biking allowed per patient tolerance      Patient will benefit from skilled therapeutic intervention in order to improve the following deficits and impairments:  Abnormal gait, Decreased endurance, Decreased scar mobility, Decreased activity tolerance, Decreased strength, Pain, Decreased balance, Decreased mobility, Difficulty walking, Decreased coordination, Impaired flexibility, Decreased safety awareness  Visit Diagnosis: Pain in left hip  Muscle weakness (generalized)  Difficulty in walking, not elsewhere classified  Unsteadiness on feet  History of falling     Problem List Patient Active Problem List   Diagnosis Date Noted  . Urinary frequency 08/10/2015  . Postoperative anemia due to acute blood loss 07/27/2015  . SVT (supraventricular tachycardia) (HCC) 07/26/2015  . Femur fracture, left (HCC) 07/24/2015  . Chronic kidney disease 07/24/2015  . Periprosthetic fracture around internal prosthetic left hip joint (HCC) 07/24/2015  . Fall at home   . DVT (deep venous thrombosis) (HCC) 01/30/2014  . Occult blood positive stool 01/30/2014  . Thrombocytopenia, unspecified (HCC) 01/06/2014  . Anemia, iron deficiency 01/06/2014  . Hip fracture, left (HCC) 01/03/2014  . QT prolongation 01/03/2014  . Atherosclerotic cardiovascular disease 08/13/2013  . Hypertensive emergency 04/08/2013  . Dizzy 04/08/2013  . PAT (paroxysmal atrial tachycardia) (HCC) 12/25/2012  . SSS (sick sinus syndrome) with PAT and marked bradycardia with 3 sec pauses  08/30/2012  . Status post placement of cardiac pacemaker. 08/29/12 St. Jude device 08/30/2012  . HTN (hypertension) 08/30/2012  . Hyperlipidemia 08/30/2012  . Osteoporosis, unspecified 04/19/2012  . Essential and other specified forms of tremor 04/19/2012  . Essential hypertension 04/19/2012  . Cardiac dysrhythmia, unspecified 04/19/2012  . Chronic ischemic heart disease 04/19/2012  . Hereditary and idiopathic  peripheral neuropathy 04/19/2012  . Memory loss 04/19/2012  . Disturbance of skin sensation 04/19/2012  . Pain in joint, shoulder region 11/20/2011  . Status post complete repair of rotator cuff 11/20/2011  . Muscle weakness (generalized) 11/20/2011   Baird Lyons  Iran OuchCockerham, LPTA; CBIS [phone]  Juel BurrowCockerham, Reta Norgren Jo 01/18/2016, 1:52 PM  [facility] [address] [place], [place], [address] Phone: [phone]   Fax:  [phone]  Name: Pamela [name] MRN: [id number] Date of Birth: January 16, 1921

## 2016-01-20 ENCOUNTER — Ambulatory Visit (HOSPITAL_COMMUNITY): Payer: Medicare Other | Admitting: Physical Therapy

## 2016-01-20 DIAGNOSIS — M25552 Pain in left hip: Secondary | ICD-10-CM

## 2016-01-20 DIAGNOSIS — M6281 Muscle weakness (generalized): Secondary | ICD-10-CM

## 2016-01-20 DIAGNOSIS — R2681 Unsteadiness on feet: Secondary | ICD-10-CM

## 2016-01-20 DIAGNOSIS — R262 Difficulty in walking, not elsewhere classified: Secondary | ICD-10-CM

## 2016-01-20 NOTE — Therapy (Signed)
[facility] [address] [place], [place], [address] Phone: [phone]   Fax:  [phone]  Physical Therapy Treatment  Patient Details  Name: Pamela [name] MRN: [id number] Date of Birth: 06/05/1921 Referring Provider: Samson FredericBrian Swinteck  Encounter Date: 01/20/2016      PT End of Session - 01/20/16 1725    Visit Number 6   Number of Visits 16   Date for PT Re-Evaluation 01/20/16   Authorization Type BCBS Medicare    Authorization Time Period 12/23/15 to 02/22/16   Authorization - Visit Number 6   Authorization - Number of Visits 10   PT Start Time 1305   PT Stop Time 1348   PT Time Calculation (min) 43 min   Equipment Utilized During Treatment Gait belt   Activity Tolerance Patient tolerated treatment well   Behavior During [facility] for tasks assessed/performed      Past Medical History  Diagnosis Date  . Pacemaker 08/29/2012    st jude accent DR RF device, model number [id number], serial number [id number], implanted 08/29/2012 for sinus bradycardia, runs of supraventricular tachycardia  last checked 10/23/2012  . ICD (implantable cardiac defibrillator) in place   . SSS (sick sinus syndrome) with PAT and marked bradycardia with 3 sec pauses  08/30/2012  . Status post placement of cardiac pacemaker. 08/29/12 St. Jude device 08/30/2012  . HTN (hypertension) 08/30/2012  . Hyperlipidemia 08/30/2012  . Polyneuropathy in other diseases classified elsewhere (HCC)   . Macular degeneration   . Coronary artery disease     stent in the LAD artery in 2002  . Venous insufficiency     Past Surgical History  Procedure Laterality Date  . Cardiac catheterization  2006    3 stents placed  . Insert / replace / remove pacemaker  08/29/2012    st jude   . Rotator cuff repair Left   . Abdominal hysterectomy    . Cesarean section    . Kidney stone surgery    . Rotator cuff repair Right   . Cardiac catheterization  06/2001    placement of BiodivYsio  2.5.10mm stent dilated to 2.7575mm in proximal left anterior descenting stenotic lesion  . Radiofrequency ablation  08/25/2010    ablation of left greater saphenous vein  . Hip arthroplasty Left 01/04/2014    Procedure: ARTHROPLASTY BIPOLAR HIP;  Surgeon: Shelda PalMatthew D Olin, MD;  [facility];  Service: Orthopedics;  Laterality: Left;  . Permanent pacemaker insertion N/A 08/29/2012    Procedure: PERMANENT PACEMAKER INSERTION;  Surgeon: Thurmon FairMihai Croitoru, MD;  Location: [facility] CATH LAB;  Service: Cardiovascular;  Laterality: N/A;  . Total hip revision Left 07/24/2015    Procedure: ORIF LEFT PERIPROSTHETIC FEMUR FRACTURE; CONVERSION TO TOTAL HIP ARTHROPLASTY POSS. FEMORAL COMPONENT REVISION ;  Surgeon: Samson FredericBrian Swinteck, MD;  [facility];  Service: Orthopedics;  Laterality: Left;    There were no vitals filed for this visit.      Subjective Assessment - 01/20/16 1708    Subjective Pt sates she is doing well and feels she is getting stronger.  Still has dizzy spells and LB pain of 3-4/10   Currently in Pain? Yes   Pain Score 4    Pain Location Back   Pain Orientation Lower   Pain Descriptors / Indicators Sore                         [facility] Adult PT Treatment/Exercise - 01/20/16 1312  Knee/Hip Exercises: Standing   Heel Raises 20 reps   Heel Raises Limitations toeraises 20 reps   Lateral Step Up Both;10 reps   Lateral Step Up Limitations 7 inch step 1 HHA   Forward Step Up Left;Hand Hold: 1;Limitations;10 reps   Forward Step Up Limitations 7 in step height   Step Down Left;10 reps;Hand Hold: 1;Hand Hold: 2;Step Height: 4"   Functional Squat 15 reps   Functional Squat Limitations cueing for form   Stairs reciprocal 7" staircase with 1 HR 2RT   Rocker Board 2 minutes  then balance holds in middle 2X30" A/P only   Gait Training 440 feet no AD   Other Standing Knee Exercises balance beam 1RT forrward                  PT Short Term Goals - 12/28/15 1519    PT SHORT  TERM GOAL #1   Title Patient to experience pain no more than 3/10 with all functional mobilty in order to improve overall QOL    Time 4   Period Weeks   Status On-going   PT SHORT TERM GOAL #2   Title Patient will demonstrate improved gait mechanics, including improved heel-toe sequence, full foot clearance with both LEs, minimal unsteadiness and shuffling, and improve gait speed in order to improve overall mobility and reduce fall risk    Time 4   Period Weeks   Status On-going   PT SHORT TERM GOAL #3   Title Patient to be independently able to list all 3 posterior hip precautions in order to maintain safety and integrity of L hip replacement during mobility    Time 4   Period Weeks   Status On-going   PT SHORT TERM GOAL #4   Title Patient to be independent in correctly and consistently performing appropriate HEP, to be updated PRN    Time 4   Period Weeks   Status On-going           PT Long Term Goals - 12/28/15 1519    PT LONG TERM GOAL #1   Title Patient to demonstrate strength at least 4/5 in all tested muscle groups in order to reduce pain, improve mobiltiy, and reduce fall risk    Time 8   Period Weeks   Status On-going   PT LONG TERM GOAL #2   Title Patient to be able to complete 5x sit to stand in 12 seconds or less in order to demonstrate improved B LE strength/power and improved mobiltiy    Time 8   Period Weeks   Status On-going   PT LONG TERM GOAL #3   Title Patient to be able to ambulate at least 943ft during in order to demonstrate improved gait tolerance and community mobiility    Time 8   Period Weeks   Status On-going   PT LONG TERM GOAL #4   Title Patient to be able to perform TUG test in 13 seconds with LRAD in order to demonstrate reduced fall risk    Time 8   Period Weeks   Status On-going               Plan - 01/20/16 1725    Clinical Impression Statement Pt started out session well without rest breaks and in good form/control.   Able tot negotiate stairs reciprocally using 1 HR and without pain.  Progressed to balance beam and patient began to c/o dizziness.  Pt requested we just work on  her gait the remainder of the time.  Pt still with slight antalgia with decreased Lt stance time.  Able to improve with verbal cues.     Rehab Potential Good   PT Frequency 2x / week   PT Duration 8 weeks   PT Treatment/Interventions ADLs/Self Care Home Management;Cryotherapy;DME Instruction;Gait training;Stair training;Functional mobility training;Therapeutic activities;Therapeutic exercise;Balance training;Neuromuscular re-education;Patient/family education;Manual techniques;Scar mobilization;Energy conservation;Taping   PT Next Visit Plan Progress functional strengthening within precautions (see below for full list of MD precautions), balance and gait training, safety awareness. NO STRAIGHT LEG RAISES EVER PER MD.  Begin forward lunges next session      Patient will benefit from skilled therapeutic intervention in order to improve the following deficits and impairments:  Abnormal gait, Decreased endurance, Decreased scar mobility, Decreased activity tolerance, Decreased strength, Pain, Decreased balance, Decreased mobility, Difficulty walking, Decreased coordination, Impaired flexibility, Decreased safety awareness  Visit Diagnosis: Pain in left hip  Muscle weakness (generalized)  Difficulty in walking, not elsewhere classified  Unsteadiness on feet     Problem List Patient Active Problem List   Diagnosis Date Noted  . Urinary frequency 08/10/2015  . Postoperative anemia due to acute blood loss 07/27/2015  . SVT (supraventricular tachycardia) (HCC) 07/26/2015  . Femur fracture, left (HCC) 07/24/2015  . Chronic kidney disease 07/24/2015  . Periprosthetic fracture around internal prosthetic left hip joint (HCC) 07/24/2015  . Fall at home   . DVT (deep venous thrombosis) (HCC) 01/30/2014  . Occult blood positive stool  01/30/2014  . Thrombocytopenia, unspecified (HCC) 01/06/2014  . Anemia, iron deficiency 01/06/2014  . Hip fracture, left (HCC) 01/03/2014  . QT prolongation 01/03/2014  . Atherosclerotic cardiovascular disease 08/13/2013  . Hypertensive emergency 04/08/2013  . Dizzy 04/08/2013  . PAT (paroxysmal atrial tachycardia) (HCC) 12/25/2012  . SSS (sick sinus syndrome) with PAT and marked bradycardia with 3 sec pauses  08/30/2012  . Status post placement of cardiac pacemaker. 08/29/12 St. Jude device 08/30/2012  . HTN (hypertension) 08/30/2012  . Hyperlipidemia 08/30/2012  . Osteoporosis, unspecified 04/19/2012  . Essential and other specified forms of tremor 04/19/2012  . Essential hypertension 04/19/2012  . Cardiac dysrhythmia, unspecified 04/19/2012  . Chronic ischemic heart disease 04/19/2012  . Hereditary and idiopathic peripheral neuropathy 04/19/2012  . Memory loss 04/19/2012  . Disturbance of skin sensation 04/19/2012  . Pain in joint, shoulder region 11/20/2011  . Status post complete repair of rotator cuff 11/20/2011  . Muscle weakness (generalized) 11/20/2011    Lurena Nidamy B Zeki Bedrosian, PTA/CLT [phone]  01/20/2016, 5:28 PM  [facility] [address] [place], [place], [address] Phone: [phone]   Fax:  [phone]  Name: Pamela [name] MRN: [id number] Date of Birth: Sep 13, 1920

## 2016-01-25 ENCOUNTER — Ambulatory Visit (HOSPITAL_COMMUNITY): Payer: Medicare Other | Admitting: Physical Therapy

## 2016-01-25 DIAGNOSIS — M25552 Pain in left hip: Secondary | ICD-10-CM | POA: Diagnosis not present

## 2016-01-25 DIAGNOSIS — M6281 Muscle weakness (generalized): Secondary | ICD-10-CM

## 2016-01-25 DIAGNOSIS — R2681 Unsteadiness on feet: Secondary | ICD-10-CM

## 2016-01-25 DIAGNOSIS — R262 Difficulty in walking, not elsewhere classified: Secondary | ICD-10-CM

## 2016-01-25 DIAGNOSIS — Z9181 History of falling: Secondary | ICD-10-CM

## 2016-01-25 NOTE — Therapy (Signed)
[place], [place], [address] Phone: [phone]   Fax:  [phone]  Physical Therapy Treatment (Re-Assessment)  Patient Details  Name: Pamela [name] MRN: [id number] Date of Birth: [birth year] Referring Provider: Rod Can  Encounter Date: 01/25/2016      PT End of Session - 01/25/16 1748    Visit Number 7   Number of Visits 13   Date for PT Re-Evaluation 02/22/16   Authorization Type BCBS Medicare (G-codes done 7th session)   Authorization Time Period 12/23/15 to 02/22/16   Authorization - Visit Number 7   Authorization - Number of Visits 17   PT Start Time 1301   PT Stop Time 1342   PT Time Calculation (min) 41 min   Activity Tolerance Patient tolerated treatment well   Behavior During Therapy [facility] for tasks assessed/performed      Past Medical History  Diagnosis Date  . Pacemaker 08/29/2012    st jude accent DR RF device, model number [id number], serial number [id number], implanted 08/29/2012 for sinus bradycardia, runs of supraventricular tachycardia  last checked 10/23/2012  . ICD (implantable cardiac defibrillator) in place   . SSS (sick sinus syndrome) with PAT and marked bradycardia with 3 sec pauses  08/30/2012  . Status post placement of cardiac pacemaker. 08/29/12 St. Jude device 08/30/2012  . HTN (hypertension) 08/30/2012  . Hyperlipidemia 08/30/2012  . Polyneuropathy in other diseases classified elsewhere ([place])   . Macular degeneration   . Coronary artery disease     stent in the LAD artery in 2002  . Venous insufficiency     Past Surgical History  Procedure Laterality Date  . Cardiac catheterization  2006    3 stents placed  . Insert / replace / remove pacemaker  08/29/2012    st jude   . Rotator cuff repair Left   . Abdominal hysterectomy    . Cesarean section    . Kidney stone surgery    . Rotator cuff repair Right   . Cardiac catheterization  06/2001    placement of BiodivYsio  2.5.10mm stent dilated to 2.43m in proximal left anterior descenting stenotic lesion  . Radiofrequency ablation  08/25/2010    ablation of left greater saphenous vein  . Hip arthroplasty Left 01/04/2014    Procedure: ARTHROPLASTY BIPOLAR HIP;  Surgeon: MMauri Pole MD;  [facility]  Service: Orthopedics;  Laterality: Left;  . Permanent pacemaker insertion N/A 08/29/2012    Procedure: PERMANENT PACEMAKER INSERTION;  Surgeon: MSanda Klein MD;  [facility];  Service: Cardiovascular;  Laterality: N/A;  . Total hip revision Left 07/24/2015    Procedure: ORIF LEFT PERIPROSTHETIC FEMUR FRACTURE; CONVERSION TO TOTAL HIP ARTHROPLASTY POSS. FEMORAL COMPONENT REVISION ;  Surgeon: BRod Can MD;  [facility]  Service: Orthopedics;  Laterality: Left;    There were no vitals filed for this visit.      Subjective Assessment - 01/25/16 1304    Subjective Patient reports that she is not yet where she'd like to be but she is improving for sure; she states taht strength and balance are her main remainnig concerns. On a subjective scale, she rates herself as being 75-80/100. Nothing else major going on, no falls or close calls recently.    Pertinent History On beta-blockers, HTN, extensive cardiac history (stents and pacemaker), history of DVT, history of falls, osteroporsis, L hip replacement in 2015/revision in 2016 posterior approach    How long can you sit  comfortably? 6/27- unlimited    How long can you stand comfortably? 6/27- at least 30 minutes    How long can you walk comfortably? 6/27- has not tried walking long distances    Patient Stated Goals get strength back, be able to walk without assistive device    Currently in Pain? No/denies            [facility] PT Assessment - 01/25/16 0001    Observation/Other Assessments   Focus on Therapeutic Outcomes (FOTO)  35% limited (was 51% limited)   Strength   Right Hip Flexion 3/5   Right Hip ABduction 3+/5  hip flexor  compensation    Left Hip Flexion 3/5   Left Hip ABduction 3/5   Right Knee Flexion 4+/5   Right Knee Extension 5/5   Left Knee Flexion 4/5   Left Knee Extension 4+/5   Right Ankle Dorsiflexion 5/5   Left Ankle Dorsiflexion 5/5   Transfers   Five time sit to stand comments  16.76   Ambulation/Gait   Gait Comments step to rather than step through pattern    6 minute walk test results    Aerobic Endurance Distance Walked 565   Endurance additional comments 6MWT, SPC    High Level Balance   High Level Balance Comments TUG 19.11 with SPC                      [facility] Adult PT Treatment/Exercise - 01/25/16 0001    Knee/Hip Exercises: Standing   Lateral Step Up Both;1 set;10 reps   Lateral Step Up Limitations 7 inch step    Forward Step Up Both;1 set;15 reps   Forward Step Up Limitations 7 inch step U railing                 PT Education - 01/25/16 1748    Education provided Yes   Education Details progress with skilled PT services, POC moving forward    Person(s) Educated Patient   Methods Explanation   Comprehension Verbalized understanding          PT Short Term Goals - 01/25/16 1328    PT SHORT TERM GOAL #1   Title Patient to experience pain no more than 3/10 with all functional mobilty in order to improve overall QOL    Baseline 6/27- reports she notices more weakness than pain    Time 4   Period Weeks   Status Achieved   PT SHORT TERM GOAL #2   Title Patient will demonstrate improved gait mechanics, including improved heel-toe sequence, full foot clearance with both LEs, minimal unsteadiness and shuffling, and improve gait speed in order to improve overall mobility and reduce fall risk    Time 4   Period Weeks   Status On-going   PT SHORT TERM GOAL #3   Title Patient to be independently able to list all 3 posterior hip precautions in order to maintain safety and integrity of L hip replacement during mobility    Baseline 6/27- general awareness     Time 4   Period Weeks   Status Achieved   PT SHORT TERM GOAL #4   Title Patient to be independent in correctly and consistently performing appropriate HEP, to be updated PRN    Baseline 6/27- reports they are going well but 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    Time 4   Period Weeks   Status Partially Met  PT Long Term Goals - Feb 24, 2016 1332    PT LONG TERM GOAL #1   Title Patient to demonstrate strength at least 4/5 in all tested muscle groups in order to reduce pain, improve mobiltiy, and reduce fall risk    Time 8   Period Weeks   Status On-going   PT LONG TERM GOAL #2   Title Patient to be able to complete 5x sit to stand in 12 seconds or less in order to demonstrate improved B LE strength/power and improved mobiltiy    Baseline 02/24/23- improved but not at 12 seconds    Time 8   Period Weeks   Status On-going   PT LONG TERM GOAL #3   Title Patient to be able to ambulate at least 921f during 6MWT in order to demonstrate improved gait tolerance and community mobiility    Baseline 607/27/24 5632fwith SPC    Time 8   Period Weeks   Status On-going   PT LONG TERM GOAL #4   Title Patient to be able to perform TUG test in 13 seconds with LRAD in order to demonstrate reduced fall risk    Baseline 6/07-27-2024improving   Time 8   Period Weeks   Status On-going               Plan - [id number]    Clinical Impression Statement Re-assessment performed today. Patient appears to be making good progress with skilled PT services, and shows improvement in the majority of functional measures performed during today's testing. She does, however continue to demonstrate significant impairments in balance, functional strength, and gait speed/mechanics and will continue to benefit from skilled PT services in order to continue to address her remaining limitations before DC to independent exercise program.    Rehab Potential Good   PT Frequency 2x /  week   PT Duration 3 weeks   PT Treatment/Interventions ADLs/Self Care Home Management;Cryotherapy;DME Instruction;Gait training;Stair training;Functional mobility training;Therapeutic activities;Therapeutic exercise;Balance training;Neuromuscular re-education;Patient/family education;Manual techniques;Scar mobilization;Energy conservation;Taping   PT Next Visit Plan Progress functional strengthening within precautions (see below for full list of MD precautions), balance and gait training, safety awareness. NO STRAIGHT LEG RAISES EVER PER MD.  Begin forward lunges next session   Consulted and Agree with Plan of Care Patient      Patient will benefit from skilled therapeutic intervention in order to improve the following deficits and impairments:  Abnormal gait, Decreased endurance, Decreased scar mobility, Decreased activity tolerance, Decreased strength, Pain, Decreased balance, Decreased mobility, Difficulty walking, Decreased coordination, Impaired flexibility, Decreased safety awareness  Visit Diagnosis: Pain in left hip  Muscle weakness (generalized)  Difficulty in walking, not elsewhere classified  Unsteadiness on feet  History of falling       G-Codes - [id number]    Functional Assessment Tool Used Based on skilled clinical assessment of strength, gait, balance, functional activity tolerance, safety    Functional Limitation Mobility: Walking and moving around   Mobility: Walking and Moving Around Current Status (G([id number] least 40 percent but less than 60 percent impaired, limited or restricted   Mobility: Walking and Moving Around Goal Status ([id number] least 20 percent but less than 40 percent impaired, limited or restricted      Problem List Patient Active Problem List   Diagnosis Date Noted  . Urinary frequency 08/10/2015  . Postoperative anemia due to acute blood loss 07/27/2015  . SVT (supraventricular tachycardia) ([place]/26/2016  . Femur fracture, left ([facility] 07/24/2015  .  Chronic kidney disease 07/24/2015  . Periprosthetic fracture around internal prosthetic left hip joint ([place]) 07/24/2015  . Fall at home   . DVT (deep venous thrombosis) ([place]) 01/30/2014  . Occult blood positive stool 01/30/2014  . Thrombocytopenia, unspecified ([place]) 01/06/2014  . Anemia, iron deficiency 01/06/2014  . Hip fracture, left ([place]) 01/03/2014  . QT prolongation 01/03/2014  . Atherosclerotic cardiovascular disease 08/13/2013  . Hypertensive emergency 04/08/2013  . Dizzy 04/08/2013  . PAT (paroxysmal atrial tachycardia) ([place]) 12/25/2012  . SSS (sick sinus syndrome) with PAT and marked bradycardia with 3 sec pauses  08/30/2012  . Status post placement of cardiac pacemaker. 08/29/12 St. Jude device 08/30/2012  . HTN (hypertension) 08/30/2012  . Hyperlipidemia 08/30/2012  . Osteoporosis, unspecified 04/19/2012  . Essential and other specified forms of tremor 04/19/2012  . Essential hypertension 04/19/2012  . Cardiac dysrhythmia, unspecified 04/19/2012  . Chronic ischemic heart disease 04/19/2012  . Hereditary and idiopathic peripheral neuropathy 04/19/2012  . Memory loss 04/19/2012  . Disturbance of skin sensation 04/19/2012  . Pain in joint, shoulder region 11/20/2011  . Status post complete repair of rotator cuff 11/20/2011  . Muscle weakness (generalized) 11/20/2011    Deniece Ree PT, DPT [place] [address] [place], [place], [address] Phone: [phone]   Fax:  [phone]  Name: WALBURGA HUDMAN MRN: [id number] Date of Birth: 1920-09-22

## 2016-01-26 ENCOUNTER — Telehealth: Payer: Self-pay | Admitting: *Deleted

## 2016-01-26 DIAGNOSIS — Z471 Aftercare following joint replacement surgery: Secondary | ICD-10-CM | POA: Diagnosis not present

## 2016-01-26 DIAGNOSIS — Z96642 Presence of left artificial hip joint: Secondary | ICD-10-CM | POA: Diagnosis not present

## 2016-01-26 NOTE — Telephone Encounter (Signed)
Received fax with substitution formula : meloxicam 0.5% /Doxepin 3%/ Amantadine 3%/ Dextromethorphan 2%/ Lidocaine 2%.

## 2016-01-27 ENCOUNTER — Ambulatory Visit (HOSPITAL_COMMUNITY): Payer: Medicare Other

## 2016-01-27 DIAGNOSIS — M25552 Pain in left hip: Secondary | ICD-10-CM | POA: Diagnosis not present

## 2016-01-27 DIAGNOSIS — M6281 Muscle weakness (generalized): Secondary | ICD-10-CM

## 2016-01-27 DIAGNOSIS — R2681 Unsteadiness on feet: Secondary | ICD-10-CM

## 2016-01-27 DIAGNOSIS — R262 Difficulty in walking, not elsewhere classified: Secondary | ICD-10-CM

## 2016-01-27 DIAGNOSIS — Z9181 History of falling: Secondary | ICD-10-CM

## 2016-01-27 NOTE — Therapy (Signed)
[place], [place], [address] Phone: [phone]   Fax:  [phone]  Physical Therapy Treatment  Patient Details  Name: Pamela [name] MRN: [id number] Date of Birth: 1921/02/10 Referring Provider: Rod Can  Encounter Date: 01/27/2016      PT End of Session - 01/27/16 1315    Visit Number 8   Number of Visits 13   Date for PT Re-Evaluation 02/22/16   Authorization Type BCBS Medicare (G-codes done 7th session)   Authorization Time Period 12/23/15 to 02/22/16   Authorization - Visit Number 8   Authorization - Number of Visits 17   PT Start Time 1300   PT Stop Time 4166   PT Time Calculation (min) 47 min   Equipment Utilized During Treatment Gait belt   Activity Tolerance Patient tolerated treatment well   Behavior During Therapy [facility] for tasks assessed/performed      Past Medical History  Diagnosis Date  . Pacemaker 08/29/2012    st jude accent DR RF device, model number [id number], serial number [id number], implanted 08/29/2012 for sinus bradycardia, runs of supraventricular tachycardia  last checked 10/23/2012  . ICD (implantable cardiac defibrillator) in place   . SSS (sick sinus syndrome) with PAT and marked bradycardia with 3 sec pauses  08/30/2012  . Status post placement of cardiac pacemaker. 08/29/12 St. Jude device 08/30/2012  . HTN (hypertension) 08/30/2012  . Hyperlipidemia 08/30/2012  . Polyneuropathy in other diseases classified elsewhere ([place])   . Macular degeneration   . Coronary artery disease     stent in the LAD artery in 2002  . Venous insufficiency     Past Surgical History  Procedure Laterality Date  . Cardiac catheterization  2006    3 stents placed  . Insert / replace / remove pacemaker  08/29/2012    st jude   . Rotator cuff repair Left   . Abdominal hysterectomy    . Cesarean section    . Kidney stone surgery    . Rotator cuff repair Right   . Cardiac catheterization  06/2001    placement of BiodivYsio 2.5.10mm stent dilated to 2.55m in proximal left anterior descenting stenotic lesion  . Radiofrequency ablation  08/25/2010    ablation of left greater saphenous vein  . Hip arthroplasty Left 01/04/2014    Procedure: ARTHROPLASTY BIPOLAR HIP;  Surgeon: MMauri Pole MD;  [facility]  Service: Orthopedics;  Laterality: Left;  . Permanent pacemaker insertion N/A 08/29/2012    Procedure: PERMANENT PACEMAKER INSERTION;  Surgeon: MSanda Klein MD;  [facility];  Service: Cardiovascular;  Laterality: N/A;  . Total hip revision Left 07/24/2015    Procedure: ORIF LEFT PERIPROSTHETIC FEMUR FRACTURE; CONVERSION TO TOTAL HIP ARTHROPLASTY POSS. FEMORAL COMPONENT REVISION ;  Surgeon: BRod Can MD;  [place]  Service: Orthopedics;  Laterality: Left;    There were no vitals filed for this visit.      Subjective Assessment - 01/27/16 1259    Subjective No reports of pain today.  Went to MD yesterday and reports doctor happy with progress, wishes for pt to ride on her bike at home more.  Pt feels as though she is making minimal progress with her balance and strength,   Pertinent History On beta-blockers, HTN, extensive cardiac history (stents and pacemaker), history of DVT, history of falls, osteroporsis, L hip replacement in 2015/revision in 2016 posterior approach    Patient Stated Goals get strength back, be able  to walk without assistive device    Currently in Pain? No/denies             [facility] Adult PT Treatment/Exercise - 01/27/16 0001    Knee/Hip Exercises: Standing   Forward Lunges 15 reps;Both   Forward Lunges Limitations 6in step   Hip Abduction Both;2 sets;10 reps   Abduction Limitations 2" holds   Functional Squat 15 reps   Functional Squat Limitations cueing for form   Rocker Board 2 minutes  R/L and A/P   Gait Training 440 feet no AD cueing for hee             Balance Exercises - 01/27/16 1323    Balance Exercises:  Standing   Tandem Stance Eyes open;3 reps;30 secs;Foam/compliant surface   SLS 3 reps;Eyes open  Rt 6", Lt 5" max of 3   Rockerboard Anterior/posterior;Lateral   Sidestepping 2 reps;Theraband  RTB   Marching Limitations 10x 3" alternating on airex             PT Short Term Goals - 01/25/16 1328    PT SHORT TERM GOAL #1   Title Patient to experience pain no more than 3/10 with all functional mobilty in order to improve overall QOL    Baseline 6/27- reports she notices more weakness than pain    Time 4   Period Weeks   Status Achieved   PT SHORT TERM GOAL #2   Title Patient will demonstrate improved gait mechanics, including improved heel-toe sequence, full foot clearance with both LEs, minimal unsteadiness and shuffling, and improve gait speed in order to improve overall mobility and reduce fall risk    Time 4   Period Weeks   Status On-going   PT SHORT TERM GOAL #3   Title Patient to be independently able to list all 3 posterior hip precautions in order to maintain safety and integrity of L hip replacement during mobility    Baseline 6/27- general awareness    Time 4   Period Weeks   Status Achieved   PT SHORT TERM GOAL #4   Title Patient to be independent in correctly and consistently performing appropriate HEP, to be updated PRN    Baseline 6/27- reports they are going well but 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    Time 4   Period Weeks   Status Partially Met           PT Long Term Goals - 01/25/16 1332    PT LONG TERM GOAL #1   Title Patient to demonstrate strength at least 4/5 in all tested muscle groups in order to reduce pain, improve mobiltiy, and reduce fall risk    Time 8   Period Weeks   Status On-going   PT LONG TERM GOAL #2   Title Patient to be able to complete 5x sit to stand in 12 seconds or less in order to demonstrate improved B LE strength/power and improved mobiltiy    Baseline 6/27- improved but not at 12  seconds    Time 8   Period Weeks   Status On-going   PT LONG TERM GOAL #3   Title Patient to be able to ambulate at least 968f during 6MWT in order to demonstrate improved gait tolerance and community mobiility    Baseline 6/27- 5655fwith SPC    Time 8   Period Weeks   Status On-going   PT LONG TERM GOAL #4  Title Patient to be able to perform TUG test in 13 seconds with LRAD in order to demonstrate reduced fall risk    Baseline 6/27- improving   Time 8   Period Weeks   Status On-going               Plan - 01/27/16 1759    Clinical Impression Statement Session focus on improving functional strengthening and balance training.  Added forward lunges for functional strengthening with min cueing..  Therapist facilitatin for proper form and technique wtih exercises and for safety with balance activities.  Pt continues to require cueing to increase stride length  with gait, pt reports she has fear of falling reason she takes small steps and increase fear of reducing UE A with balance activities.     Rehab Potential Good   PT Frequency 2x / week   PT Duration 3 weeks   PT Treatment/Interventions ADLs/Self Care Home Management;Cryotherapy;DME Instruction;Gait training;Stair training;Functional mobility training;Therapeutic activities;Therapeutic exercise;Balance training;Neuromuscular re-education;Patient/family education;Manual techniques;Scar mobilization;Energy conservation;Taping   PT Next Visit Plan Progress functional strengthening within precautions (see below for full list of MD precautions), balance and gait training, safety awareness. NO STRAIGHT LEG RAISES EVER PER MD.     Recommended Other Services Per MD protocol: WBAT; hip precautions followed for 6 weeks for posterior approach; STRAIGHT LEG RAISE PROHIBITED; ITB stretch allowed; rectus femoris stretch allowed; active ABD allowed; golfing and biking allowed per patient tolerance      Patient will benefit from skilled  therapeutic intervention in order to improve the following deficits and impairments:  Abnormal gait, Decreased endurance, Decreased scar mobility, Decreased activity tolerance, Decreased strength, Pain, Decreased balance, Decreased mobility, Difficulty walking, Decreased coordination, Impaired flexibility, Decreased safety awareness  Visit Diagnosis: Pain in left hip  Muscle weakness (generalized)  Difficulty in walking, not elsewhere classified  Unsteadiness on feet  History of falling     Problem List Patient Active Problem List   Diagnosis Date Noted  . Urinary frequency 08/10/2015  . Postoperative anemia due to acute blood loss 07/27/2015  . SVT (supraventricular tachycardia) ([place]) 07/26/2015  . Femur fracture, left ([place]) 07/24/2015  . Chronic kidney disease 07/24/2015  . Periprosthetic fracture around internal prosthetic left hip joint ([place]) 07/24/2015  . Fall at home   . DVT (deep venous thrombosis) ([place]) 01/30/2014  . Occult blood positive stool 01/30/2014  . Thrombocytopenia, unspecified ([place]) 01/06/2014  . Anemia, iron deficiency 01/06/2014  . Hip fracture, left ([place]) 01/03/2014  . QT prolongation 01/03/2014  . Atherosclerotic cardiovascular disease 08/13/2013  . Hypertensive emergency 04/08/2013  . Dizzy 04/08/2013  . PAT (paroxysmal atrial tachycardia) ([place]) 12/25/2012  . SSS (sick sinus syndrome) with PAT and marked bradycardia with 3 sec pauses  08/30/2012  . Status post placement of cardiac pacemaker. 08/29/12 St. Jude device 08/30/2012  . HTN (hypertension) 08/30/2012  . Hyperlipidemia 08/30/2012  . Osteoporosis, unspecified 04/19/2012  . Essential and other specified forms of tremor 04/19/2012  . Essential hypertension 04/19/2012  . Cardiac dysrhythmia, unspecified 04/19/2012  . Chronic ischemic heart disease 04/19/2012  . Hereditary and idiopathic peripheral neuropathy 04/19/2012  . Memory loss 04/19/2012  . Disturbance of skin sensation 04/19/2012  .  Pain in joint, shoulder region 11/20/2011  . Status post complete repair of rotator cuff 11/20/2011  . Muscle weakness (generalized) 11/20/2011   Ihor Austin, [place]; [place]  Aldona Lento 01/27/2016, 6:24 PM  [facility] [address] [place], [place], [address] Phone: [phone]  Fax:  [phone]  Name: SHAILYN WEYANDT MRN: [id number] Date of Birth: 1921-07-30

## 2016-01-28 NOTE — Telephone Encounter (Signed)
LMVM for pt that returning her call.   I did call [facility] and spoke with pharmacist.  She stated the bottle should be labeled with instructions, along with card/information about the way to apply.  Please call back before 1200 and I will be glad to relay these instructions to her.

## 2016-01-28 NOTE — Telephone Encounter (Addendum)
Pt received the compound cream and she does not know how to use it. It did not have any instructions with it. Please call and advise [phone]

## 2016-01-31 NOTE — Telephone Encounter (Signed)
I spoke to pt.  She will be getting another set of instructions on how to us cream.  She said her daughter opened up the package and it  was 2 bottles of cream with instructions.  She has used it and it is working well.  She stated sorry for the confusion.

## 2016-02-03 ENCOUNTER — Ambulatory Visit (HOSPITAL_COMMUNITY): Payer: Medicare Other | Attending: Orthopedic Surgery | Admitting: Physical Therapy

## 2016-02-03 DIAGNOSIS — M25552 Pain in left hip: Secondary | ICD-10-CM | POA: Insufficient documentation

## 2016-02-03 DIAGNOSIS — R262 Difficulty in walking, not elsewhere classified: Secondary | ICD-10-CM | POA: Diagnosis not present

## 2016-02-03 DIAGNOSIS — Z9181 History of falling: Secondary | ICD-10-CM | POA: Insufficient documentation

## 2016-02-03 DIAGNOSIS — M6281 Muscle weakness (generalized): Secondary | ICD-10-CM | POA: Insufficient documentation

## 2016-02-03 DIAGNOSIS — R2681 Unsteadiness on feet: Secondary | ICD-10-CM | POA: Diagnosis not present

## 2016-02-03 NOTE — Therapy (Signed)
[place], [place], [address] Phone: [phone]   Fax:  [phone]  Physical Therapy Treatment  Patient Details  Name: Pamela [name] MRN: [id number] Date of Birth: 02/26/1921 Referring Provider: Rod Can  Encounter Date: 02/03/2016      PT End of Session - 02/03/16 1342    Visit Number 9   Number of Visits 13   Date for PT Re-Evaluation 02/22/16   Authorization Type BCBS Medicare (G-codes done 7th session)   Authorization Time Period 12/23/15 to 02/22/16   Authorization - Visit Number 9   Authorization - Number of Visits 17   PT Start Time 1300   PT Stop Time 1341   PT Time Calculation (min) 41 min   Activity Tolerance Patient tolerated treatment well   Behavior During Therapy [facility] for tasks assessed/performed      Past Medical History  Diagnosis Date  . Pacemaker 08/29/2012    st jude accent DR RF device, model number [id number], serial number [id number], implanted 08/29/2012 for sinus bradycardia, runs of supraventricular tachycardia  last checked 10/23/2012  . ICD (implantable cardiac defibrillator) in place   . SSS (sick sinus syndrome) with PAT and marked bradycardia with 3 sec pauses  08/30/2012  . Status post placement of cardiac pacemaker. 08/29/12 St. Jude device 08/30/2012  . HTN (hypertension) 08/30/2012  . Hyperlipidemia 08/30/2012  . Polyneuropathy in other diseases classified elsewhere ([place])   . Macular degeneration   . Coronary artery disease     stent in the LAD artery in 2002  . Venous insufficiency     Past Surgical History  Procedure Laterality Date  . Cardiac catheterization  2006    3 stents placed  . Insert / replace / remove pacemaker  08/29/2012    st jude   . Rotator cuff repair Left   . Abdominal hysterectomy    . Cesarean section    . Kidney stone surgery    . Rotator cuff repair Right   . Cardiac catheterization  06/2001    placement of BiodivYsio 2.5.10mm stent dilated  to 2.56m in proximal left anterior descenting stenotic lesion  . Radiofrequency ablation  08/25/2010    ablation of left greater saphenous vein  . Hip arthroplasty Left 01/04/2014    Procedure: ARTHROPLASTY BIPOLAR HIP;  Surgeon: MMauri Pole MD;  [place]  Service: Orthopedics;  Laterality: Left;  . Permanent pacemaker insertion N/A 08/29/2012    Procedure: PERMANENT PACEMAKER INSERTION;  Surgeon: MSanda Klein MD;  [facility];  Service: Cardiovascular;  Laterality: N/A;  . Total hip revision Left 07/24/2015    Procedure: ORIF LEFT PERIPROSTHETIC FEMUR FRACTURE; CONVERSION TO TOTAL HIP ARTHROPLASTY POSS. FEMORAL COMPONENT REVISION ;  Surgeon: BRod Can MD;  [place]  Service: Orthopedics;  Laterality: Left;    There were no vitals filed for this visit.      Subjective Assessment - 02/03/16 1303    Subjective Patient reports that she did not sleep a lot last night, had a rough night. She has increased pain today as she was up and down a lot last night.    Currently in Pain? Yes   Pain Score 7    Pain Location Back   Pain Orientation Lower   Pain Descriptors / Indicators Aching   Pain Type Chronic pain   Pain Radiating Towards none    Pain Onset More than a month ago   Pain Frequency Constant  Aggravating Factors  weight bearing    Pain Relieving Factors elevation of limb    Effect of Pain on Daily Activities none                          [facility] Adult PT Treatment/Exercise - 02/03/16 0001    Knee/Hip Exercises: Standing   Heel Raises 20 reps   Heel Raises Limitations toe and heel    Forward Lunges 15 reps;Both   Forward Lunges Limitations attempetd, difficulty with form due to fatigue    Hip Abduction Both;2 sets;10 reps   Abduction Limitations 2" holds   Lateral Step Up Both;1 set;15 reps   Lateral Step Up Limitations 7 inch step    Forward Step Up Both;1 set;15 reps   Forward Step Up Limitations 7 inch step U railing     Rocker Board 2 minutes   Rocker Board Limitations AP and lateral        Other Standing Knee Exercises hip ABD holds with feet barely off of floor 1x5 each side              Balance Exercises - 02/03/16 1329    Balance Exercises: Standing   Standing Eyes Closed Narrow base of support (BOS);Foam/compliant surface;3 reps;20 secs   Tandem Stance Eyes open;Foam/compliant surface;3 reps;15 secs   SLS 3 reps;Eyes open;Solid surface   Retro Gait Other (comment);2 reps  38f           PT Education - 02/03/16 1342    Education provided No          PT Short Term Goals - 01/25/16 1328    PT SHORT TERM GOAL #1   Title Patient to experience pain no more than 3/10 with all functional mobilty in order to improve overall QOL    Baseline 6/27- reports she notices more weakness than pain    Time 4   Period Weeks   Status Achieved   PT SHORT TERM GOAL #2   Title Patient will demonstrate improved gait mechanics, including improved heel-toe sequence, full foot clearance with both LEs, minimal unsteadiness and shuffling, and improve gait speed in order to improve overall mobility and reduce fall risk    Time 4   Period Weeks   Status On-going   PT SHORT TERM GOAL #3   Title Patient to be independently able to list all 3 posterior hip precautions in order to maintain safety and integrity of L hip replacement during mobility    Baseline 6/27- general awareness    Time 4   Period Weeks   Status Achieved   PT SHORT TERM GOAL #4   Title Patient to be independent in correctly and consistently performing appropriate HEP, to be updated PRN    Baseline 6/27- reports they are going well but 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    Time 4   Period Weeks   Status Partially Met           PT Long Term Goals - 01/25/16 1332    PT LONG TERM GOAL #1   Title Patient to demonstrate strength at least 4/5 in all tested muscle groups in order to reduce pain, improve  mobiltiy, and reduce fall risk    Time 8   Period Weeks   Status On-going   PT LONG TERM GOAL #2   Title Patient to be able to complete 5x sit to stand  in 12 seconds or less in order to demonstrate improved B LE strength/power and improved mobiltiy    Baseline 6/27- improved but not at 12 seconds    Time 8   Period Weeks   Status On-going   PT LONG TERM GOAL #3   Title Patient to be able to ambulate at least 943f during 6MWT in order to demonstrate improved gait tolerance and community mobiility    Baseline 6/27- 5616fwith SPC    Time 8   Period Weeks   Status On-going   PT LONG TERM GOAL #4   Title Patient to be able to perform TUG test in 13 seconds with LRAD in order to demonstrate reduced fall risk    Baseline 6/27- improving   Time 8   Period Weeks   Status On-going               Plan - 02/03/16 1343    Clinical Impression Statement Patient arrives today reporting fatigue due to being up and down a lot last night- she just did not have a good night-, as well as being off due to lack of sleep. Continued focus on functional strengthening as a whole as well as balance strategies both on and off of foam today. Occasional cues for form during exercises likely due to patient's fatigue today. Patient did however require ongoing cues for improved step lengths with and without cane, tending to take small, shuffling steps today.    Rehab Potential Good   PT Frequency 2x / week   PT Duration 3 weeks   PT Treatment/Interventions ADLs/Self Care Home Management;Cryotherapy;DME Instruction;Gait training;Stair training;Functional mobility training;Therapeutic activities;Therapeutic exercise;Balance training;Neuromuscular re-education;Patient/family education;Manual techniques;Scar mobilization;Energy conservation;Taping   PT Next Visit Plan Progress functional strengthening within precautions (see below for full list of MD precautions), balance and gait training, safety awareness. NO  STRAIGHT LEG RAISES EVER PER MD.     Consulted and Agree with Plan of Care Patient      Patient will benefit from skilled therapeutic intervention in order to improve the following deficits and impairments:  Abnormal gait, Decreased endurance, Decreased scar mobility, Decreased activity tolerance, Decreased strength, Pain, Decreased balance, Decreased mobility, Difficulty walking, Decreased coordination, Impaired flexibility, Decreased safety awareness  Visit Diagnosis: Pain in left hip  Muscle weakness (generalized)  Difficulty in walking, not elsewhere classified  Unsteadiness on feet     Problem List Patient Active Problem List   Diagnosis Date Noted  . Urinary frequency 08/10/2015  . Postoperative anemia due to acute blood loss 07/27/2015  . SVT (supraventricular tachycardia) (HCE[facility]12/26/2016  . Femur fracture, left (HC[facility]ick[facility]12/24/2016  . Chronic kidney disease 07/24/2015  . Periprosthetic fracture around internal prosthetic left hip joint ([facility]12/24/2016  . Fall at home   . DVT (deep venous thrombosis) (HCM[facility]07/09/2013  . Occult blood positive stool 01/30/2014  . Thrombocytopenia, unspecified (HCMont[facility]06/03/2014  . Anemia, iron deficiency 01/06/2014  . Hip fracture, left (HCBurkitts[facility]06/12/2013  . QT prolongation 01/03/2014  . Atherosclerotic cardiovascular disease 08/13/2013  . Hypertensive emergency 04/08/2013  . Dizzy 04/08/2013  . PAT (paroxysmal atrial tachycardia) (HC[facility]oungs[place]05/28/2014  . SSS (sick sinus syndrome) with PAT and marked bradycardia with 3 sec pauses  08/30/2012  . Status post placement of cardiac pacemaker. 08/29/12 St. Jude device 08/30/2012  . HTN (hypertension) 08/30/2012  . Hyperlipidemia 08/30/2012  . Osteoporosis, unspecified 04/19/2012  . Essential and other specified forms of tremor 04/19/2012  . Essential hypertension 04/19/2012  . Cardiac dysrhythmia, unspecified 04/19/2012  .  Chronic ischemic heart disease 04/19/2012  . Hereditary and idiopathic  peripheral neuropathy 04/19/2012  . Memory loss 04/19/2012  . Disturbance of skin sensation 04/19/2012  . Pain in joint, shoulder region 11/20/2011  . Status post complete repair of rotator cuff 11/20/2011  . Muscle weakness (generalized) 11/20/2011    Deniece Ree PT, DPT [facility] [address] [place], [place], [address] Phone: [phone]   Fax:  [phone]  Name: Pamela [name] MRN: [id number] Date of Birth: [birth year]

## 2016-02-08 ENCOUNTER — Ambulatory Visit (HOSPITAL_COMMUNITY): Payer: Medicare Other | Admitting: Physical Therapy

## 2016-02-08 DIAGNOSIS — R2681 Unsteadiness on feet: Secondary | ICD-10-CM

## 2016-02-08 DIAGNOSIS — M6281 Muscle weakness (generalized): Secondary | ICD-10-CM

## 2016-02-08 DIAGNOSIS — M25552 Pain in left hip: Secondary | ICD-10-CM

## 2016-02-08 DIAGNOSIS — R262 Difficulty in walking, not elsewhere classified: Secondary | ICD-10-CM

## 2016-02-08 NOTE — Therapy (Signed)
[place], [place], [address] Phone: [phone]   Fax:  [phone]  Physical Therapy Treatment  Patient Details  Name: Pamela [name] MRN: [id number] Date of Birth: Mar 10, 1921 Referring Provider: Rod Can  Encounter Date: 02/08/2016      PT End of Session - 02/08/16 1427    Visit Number 10   Number of Visits 13   Date for PT Re-Evaluation 02/22/16   Authorization Type BCBS Medicare (G-codes done 7th session)   Authorization Time Period 12/23/15 to 02/22/16   Authorization - Visit Number 10   Authorization - Number of Visits 17   PT Start Time 6256   PT Stop Time 1430   PT Time Calculation (min) 38 min   Activity Tolerance Patient tolerated treatment well   Behavior During Therapy [facility] for tasks assessed/performed      Past Medical History  Diagnosis Date  . Pacemaker 08/29/2012    st jude accent DR RF device, model number [id number], serial number [id number], implanted 08/29/2012 for sinus bradycardia, runs of supraventricular tachycardia  last checked 10/23/2012  . ICD (implantable cardiac defibrillator) in place   . SSS (sick sinus syndrome) with PAT and marked bradycardia with 3 sec pauses  08/30/2012  . Status post placement of cardiac pacemaker. 08/29/12 St. Jude device 08/30/2012  . HTN (hypertension) 08/30/2012  . Hyperlipidemia 08/30/2012  . Polyneuropathy in other diseases classified elsewhere ([place])   . Macular degeneration   . Coronary artery disease     stent in the LAD artery in 2002  . Venous insufficiency     Past Surgical History  Procedure Laterality Date  . Cardiac catheterization  2006    3 stents placed  . Insert / replace / remove pacemaker  08/29/2012    st jude   . Rotator cuff repair Left   . Abdominal hysterectomy    . Cesarean section    . Kidney stone surgery    . Rotator cuff repair Right   . Cardiac catheterization  06/2001    placement of BiodivYsio 2.5.10mm stent  dilated to 2.66m in proximal left anterior descenting stenotic lesion  . Radiofrequency ablation  08/25/2010    ablation of left greater saphenous vein  . Hip arthroplasty Left 01/04/2014    Procedure: ARTHROPLASTY BIPOLAR HIP;  Surgeon: MMauri Pole MD;  [place]  Service: Orthopedics;  Laterality: Left;  . Permanent pacemaker insertion N/A 08/29/2012    Procedure: PERMANENT PACEMAKER INSERTION;  Surgeon: MSanda Klein MD;  [facility];  Service: Cardiovascular;  Laterality: N/A;  . Total hip revision Left 07/24/2015    Procedure: ORIF LEFT PERIPROSTHETIC FEMUR FRACTURE; CONVERSION TO TOTAL HIP ARTHROPLASTY POSS. FEMORAL COMPONENT REVISION ;  Surgeon: BRod Can MD;  [place]  Service: Orthopedics;  Laterality: Left;    There were no vitals filed for this visit.      Subjective Assessment - 02/08/16 1355    Subjective Patient arrives today reporting she is feeling well, no major changes since last time except she did sleep better    Pertinent History On beta-blockers, HTN, extensive cardiac history (stents and pacemaker), history of DVT, history of falls, osteroporsis, L hip replacement in 2015/revision in 2016 posterior approach    Currently in Pain? Yes   Pain Score 8    Pain Location Leg   Pain Orientation Left   Pain Descriptors / Indicators Tiring   Pain Type Acute pain   Pain Radiating  Towards none    Pain Onset Today   Pain Frequency Constant   Aggravating Factors  none    Pain Relieving Factors resting it    Effect of Pain on Daily Activities none                          [facility] Adult PT Treatment/Exercise - 02/08/16 0001    Knee/Hip Exercises: Aerobic   Nustep 7 minutes level 3  supervision provided today due to patient not having been on machine for awhile/to ensure tolerance    Knee/Hip Exercises: Standing   Heel Raises 20 reps   Heel Raises Limitations toe and heel    Lateral Step Up Both;1 set;20 reps   Lateral  Step Up Limitations 7 inch step    Forward Step Up Both;1 set;15 reps   Forward Step Up Limitations 7 inch step U railing    Functional Squat 15 reps   Functional Squat Limitations cueing for form   Other Standing Knee Exercises eccentric sit to stnad 1x15             Balance Exercises - 02/08/16 1359    Balance Exercises: Standing   Standing Eyes Closed Narrow base of support (BOS);Foam/compliant surface;3 reps;20 secs   Rockerboard Lateral;Other (comment)  no UE support    Tandem Gait 4 reps;Other (comment)  31f           PT Education - 02/08/16 1427    Education provided No          PT Short Term Goals - 01/25/16 1328    PT SHORT TERM GOAL #1   Title Patient to experience pain no more than 3/10 with all functional mobilty in order to improve overall QOL    Baseline 6/27- reports she notices more weakness than pain    Time 4   Period Weeks   Status Achieved   PT SHORT TERM GOAL #2   Title Patient will demonstrate improved gait mechanics, including improved heel-toe sequence, full foot clearance with both LEs, minimal unsteadiness and shuffling, and improve gait speed in order to improve overall mobility and reduce fall risk    Time 4   Period Weeks   Status On-going   PT SHORT TERM GOAL #3   Title Patient to be independently able to list all 3 posterior hip precautions in order to maintain safety and integrity of L hip replacement during mobility    Baseline 6/27- general awareness    Time 4   Period Weeks   Status Achieved   PT SHORT TERM GOAL #4   Title Patient to be independent in correctly and consistently performing appropriate HEP, to be updated PRN    Baseline 6/27- reports they are going well but 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    Time 4   Period Weeks   Status Partially Met           PT Long Term Goals - 01/25/16 1332    PT LONG TERM GOAL #1   Title Patient to demonstrate strength at least 4/5 in all  tested muscle groups in order to reduce pain, improve mobiltiy, and reduce fall risk    Time 8   Period Weeks   Status On-going   PT LONG TERM GOAL #2   Title Patient to be able to complete 5x sit to stand in 12 seconds or less in order to demonstrate  improved B LE strength/power and improved mobiltiy    Baseline 6/27- improved but not at 12 seconds    Time 8   Period Weeks   Status On-going   PT LONG TERM GOAL #3   Title Patient to be able to ambulate at least 967f during 6MWT in order to demonstrate improved gait tolerance and community mobiility    Baseline 6/27- 5661fwith SPC    Time 8   Period Weeks   Status On-going   PT LONG TERM GOAL #4   Title Patient to be able to perform TUG test in 13 seconds with LRAD in order to demonstrate reduced fall risk    Baseline 6/27- improving   Time 8   Period Weeks   Status On-going               Plan - 02/08/16 1428    Clinical Impression Statement Continued with general functional strengthening and balance training today, with cues provided PRN throughout session. Patient seemed to tolerate today's session well overall however did express some fatigue/tiring pain in the front of her L LE, which seems to likely be related to soft tissue at this time due to positive patient reported response to heat. Patient requested to try an exercise bike today, which was performed with supervision due to patient not having been on machine for awhile.    Rehab Potential Good   PT Frequency 2x / week   PT Duration 3 weeks   PT Treatment/Interventions ADLs/Self Care Home Management;Cryotherapy;DME Instruction;Gait training;Stair training;Functional mobility training;Therapeutic activities;Therapeutic exercise;Balance training;Neuromuscular re-education;Patient/family education;Manual techniques;Scar mobilization;Energy conservation;Taping   PT Next Visit Plan Progress functional strengthening within precautions (see below for full list of MD  precautions), balance and gait training, safety awareness. NO STRAIGHT LEG RAISES EVER PER MD.     PT Home Exercise Plan keeping up with HHPT for now    Consulted and Agree with Plan of Care Patient      Patient will benefit from skilled therapeutic intervention in order to improve the following deficits and impairments:  Abnormal gait, Decreased endurance, Decreased scar mobility, Decreased activity tolerance, Decreased strength, Pain, Decreased balance, Decreased mobility, Difficulty walking, Decreased coordination, Impaired flexibility, Decreased safety awareness  Visit Diagnosis: Pain in left hip  Muscle weakness (generalized)  Difficulty in walking, not elsewhere classified  Unsteadiness on feet     Problem List Patient Active Problem List   Diagnosis Date Noted  . Urinary frequency 08/10/2015  . Postoperative anemia due to acute blood loss 07/27/2015  . SVT (supraventricular tachycardia) (HCF[facility] [place]12/26/2016  . Femur fracture, left (HCAng[place]12/24/2016  . Chronic kidney disease 07/24/2015  . Periprosthetic fracture around internal prosthetic left hip joint (HC[facility]12/24/2016  . Fall at home   . DVT (deep venous thrombosis) (HCPue[facility]07/09/2013  . Occult blood positive stool 01/30/2014  . Thrombocytopenia, unspecified (HCF[facility]06/03/2014  . Anemia, iron deficiency 01/06/2014  . Hip fracture, left (HCV[facility]06/12/2013  . QT prolongation 01/03/2014  . Atherosclerotic cardiovascular disease 08/13/2013  . Hypertensive emergency 04/08/2013  . Dizzy 04/08/2013  . PAT (paroxysmal atrial tachycardia) (HCCud[facility]05/28/2014  . SSS (sick sinus syndrome) with PAT and marked bradycardia with 3 sec pauses  08/30/2012  . Status post placement of cardiac pacemaker. 08/29/12 St. Jude device 08/30/2012  . HTN (hypertension) 08/30/2012  . Hyperlipidemia 08/30/2012  . Osteoporosis, unspecified 04/19/2012  . Essential and other specified forms of tremor 04/19/2012  . Essential hypertension 04/19/2012  . Cardiac  dysrhythmia, unspecified 04/19/2012  .  Chronic ischemic heart disease 04/19/2012  . Hereditary and idiopathic peripheral neuropathy 04/19/2012  . Memory loss 04/19/2012  . Disturbance of skin sensation 04/19/2012  . Pain in joint, shoulder region 11/20/2011  . Status post complete repair of rotator cuff 11/20/2011  . Muscle weakness (generalized) 11/20/2011    Deniece Ree PT, DPT [facility] [address] [place], [place], [address] Phone: [phone]   Fax:  [phone]  Name: SHERIECE JEFCOAT MRN: [id number] Date of Birth: 07/28/1921

## 2016-02-10 ENCOUNTER — Ambulatory Visit (HOSPITAL_COMMUNITY): Payer: Medicare Other

## 2016-02-10 DIAGNOSIS — Z9181 History of falling: Secondary | ICD-10-CM

## 2016-02-10 DIAGNOSIS — R2681 Unsteadiness on feet: Secondary | ICD-10-CM

## 2016-02-10 DIAGNOSIS — M6281 Muscle weakness (generalized): Secondary | ICD-10-CM

## 2016-02-10 DIAGNOSIS — R262 Difficulty in walking, not elsewhere classified: Secondary | ICD-10-CM

## 2016-02-10 DIAGNOSIS — M25552 Pain in left hip: Secondary | ICD-10-CM | POA: Diagnosis not present

## 2016-02-10 NOTE — Therapy (Signed)
[place], [place], [address] Phone: [phone]   Fax:  [phone]  Physical Therapy Treatment  Patient Details  Name: Pamela [name] MRN: [id number] Date of Birth: 01/05/1921 Referring Provider: Rod Can  Encounter Date: 02/10/2016      PT End of Session - 02/10/16 1306    Visit Number 11   Number of Visits 13   Date for PT Re-Evaluation 02/22/16   Authorization Type BCBS Medicare (G-codes done 7th session)   Authorization Time Period 12/23/15 to 02/22/16   Authorization - Visit Number 11   Authorization - Number of Visits 17   PT Start Time 1300   PT Stop Time 5284   PT Time Calculation (min) 52 min   Equipment Utilized During Treatment Gait belt   Activity Tolerance Patient tolerated treatment well   Behavior During Therapy [facility] for tasks assessed/performed      Past Medical History  Diagnosis Date  . Pacemaker 08/29/2012    st jude accent DR RF device, model number [id number], serial number [id number], implanted 08/29/2012 for sinus bradycardia, runs of supraventricular tachycardia  last checked 10/23/2012  . ICD (implantable cardiac defibrillator) in place   . SSS (sick sinus syndrome) with PAT and marked bradycardia with 3 sec pauses  08/30/2012  . Status post placement of cardiac pacemaker. 08/29/12 St. Jude device 08/30/2012  . HTN (hypertension) 08/30/2012  . Hyperlipidemia 08/30/2012  . Polyneuropathy in other diseases classified elsewhere ([place])   . Macular degeneration   . Coronary artery disease     stent in the LAD artery in 2002  . Venous insufficiency     Past Surgical History  Procedure Laterality Date  . Cardiac catheterization  2006    3 stents placed  . Insert / replace / remove pacemaker  08/29/2012    st jude   . Rotator cuff repair Left   . Abdominal hysterectomy    . Cesarean section    . Kidney stone surgery    . Rotator cuff repair Right   . Cardiac catheterization  06/2001     placement of BiodivYsio 2.5.10mm stent dilated to 2.83m in proximal left anterior descenting stenotic lesion  . Radiofrequency ablation  08/25/2010    ablation of left greater saphenous vein  . Hip arthroplasty Left 01/04/2014    Procedure: ARTHROPLASTY BIPOLAR HIP;  Surgeon: MMauri Pole MD;  [place]  Service: Orthopedics;  Laterality: Left;  . Permanent pacemaker insertion N/A 08/29/2012    Procedure: PERMANENT PACEMAKER INSERTION;  Surgeon: MSanda Klein MD;  [facility];  Service: Cardiovascular;  Laterality: N/A;  . Total hip revision Left 07/24/2015    Procedure: ORIF LEFT PERIPROSTHETIC FEMUR FRACTURE; CONVERSION TO TOTAL HIP ARTHROPLASTY POSS. FEMORAL COMPONENT REVISION ;  Surgeon: BRod Can MD;  [place] [facility]  Service: Orthopedics;  Laterality: Left;    There were no vitals filed for this visit.      Subjective Assessment - 02/10/16 1304    Subjective Pt stated she is feeling good today, no reports of pain.  Feels weak Lt LE and wishes to work on sPrintmakertoday.     Pertinent History On beta-blockers, HTN, extensive cardiac history (stents and pacemaker), history of DVT, history of falls, osteroporsis, L hip replacement in 2015/revision in 2016 posterior approach    Patient Stated Goals get strength back, be able to walk without assistive device    Currently in Pain? No/denies  [place] Adult PT Treatment/Exercise - 02/10/16 0001    Knee/Hip Exercises: Aerobic   Nustep 8 min L3 with UE and LE end of session unsupervised, no charge   Knee/Hip Exercises: Standing   Heel Raises 20 reps   Heel Raises Limitations toe and heel    Forward Lunges 15 reps;Both   Forward Lunges Limitations 6in step   Lateral Step Up Both;1 set;20 reps   Lateral Step Up Limitations 7 inch step    Forward Step Up Both;1 set;15 reps   Forward Step Up Limitations 7 inch step U railing    Step Down 15 reps   Step Down Limitations  7 inch step    Functional Squat 2 sets;10 reps   Functional Squat Limitations Improved form 2nd set with theraband around knees to reduce genu valgus   Rocker Board 2 minutes   Rocker Board Limitations AP and lateral    Gait Training 452 no AD cueing for increased stride length.             Balance Exercises - 02/10/16 1316    Balance Exercises: Standing   Standing Eyes Closed Narrow base of support (BOS);Foam/compliant surface;3 reps;20 secs   Rockerboard Lateral   Balance Beam Tandem 2RT   Tandem Gait 2 reps   Sidestepping 2 reps;Theraband  RTB   Sit to Stand Time 20 reps over 3 sets; able to complete 5 STS 13.67"             PT Short Term Goals - 01/25/16 1328    PT SHORT TERM GOAL #1   Title Patient to experience pain no more than 3/10 with all functional mobilty in order to improve overall QOL    Baseline 6/27- reports she notices more weakness than pain    Time 4   Period Weeks   Status Achieved   PT SHORT TERM GOAL #2   Title Patient will demonstrate improved gait mechanics, including improved heel-toe sequence, full foot clearance with both LEs, minimal unsteadiness and shuffling, and improve gait speed in order to improve overall mobility and reduce fall risk    Time 4   Period Weeks   Status On-going   PT SHORT TERM GOAL #3   Title Patient to be independently able to list all 3 posterior hip precautions in order to maintain safety and integrity of L hip replacement during mobility    Baseline 6/27- general awareness    Time 4   Period Weeks   Status Achieved   PT SHORT TERM GOAL #4   Title Patient to be independent in correctly and consistently performing appropriate HEP, to be updated PRN    Baseline 6/27- reports they are going well but 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    Time 4   Period Weeks   Status Partially Met           PT Long Term Goals - 01/25/16 1332    PT LONG TERM GOAL #1   Title Patient to  demonstrate strength at least 4/5 in all tested muscle groups in order to reduce pain, improve mobiltiy, and reduce fall risk    Time 8   Period Weeks   Status On-going   PT LONG TERM GOAL #2   Title Patient to be able to complete 5x sit to stand in 12 seconds or less in order to demonstrate improved B LE strength/power and improved mobiltiy    Baseline 6/27- improved  but not at 12 seconds    Time 8   Period Weeks   Status On-going   PT LONG TERM GOAL #3   Title Patient to be able to ambulate at least 961f during 6MWT in order to demonstrate improved gait tolerance and community mobiility    Baseline 6/27- 5634fwith SPC    Time 8   Period Weeks   Status On-going   PT LONG TERM GOAL #4   Title Patient to be able to perform TUG test in 13 seconds with LRAD in order to demonstrate reduced fall risk    Baseline 6/27- improving   Time 8   Period Weeks   Status On-going               Plan - 02/10/16 1356    Clinical Impression Statement Continued session focus with functional strengthening and balance training.  Therapist facilitaiton through session for proper form and technique with therex and for safety with balance activities.  Improved form with squats with theraband around knee to reduce genu valgus.  Ended session with Nustep for activity tolerance and strengthening.  Pt was limited by fatigue with Lt LE, no reports of pain today.     Rehab Potential Good   PT Frequency 2x / week   PT Duration 3 weeks   PT Treatment/Interventions ADLs/Self Care Home Management;Cryotherapy;DME Instruction;Gait training;Stair training;Functional mobility training;Therapeutic activities;Therapeutic exercise;Balance training;Neuromuscular re-education;Patient/family education;Manual techniques;Scar mobilization;Energy conservation;Taping   PT Next Visit Plan Progress functional strengthening within precautions (see below for full list of MD precautions), balance and gait training, safety  awareness. NO STRAIGHT LEG RAISES EVER PER MD.     PT Home Exercise Plan no updates today   Recommended Other Services Per MD protocol: WBAT; hip precautions followed for 6 weeks for posterior approach; STRAIGHT LEG RAISE PROHIBITED; ITB stretch allowed; rectus femoris stretch allowed; active ABD allowed; golfing and biking allowed per patient tolerance      Patient will benefit from skilled therapeutic intervention in order to improve the following deficits and impairments:  Abnormal gait, Decreased endurance, Decreased scar mobility, Decreased activity tolerance, Decreased strength, Pain, Decreased balance, Decreased mobility, Difficulty walking, Decreased coordination, Impaired flexibility, Decreased safety awareness  Visit Diagnosis: Pain in left hip  Muscle weakness (generalized)  Difficulty in walking, not elsewhere classified  Unsteadiness on feet  History of falling     Problem List Patient Active Problem List   Diagnosis Date Noted  . Urinary frequency 08/10/2015  . Postoperative anemia due to acute blood loss 07/27/2015  . SVT (supraventricular tachycardia) ([facility] [place]12/26/2016  . Femur fracture, left ([facility]12/24/2016  . Chronic kidney disease 07/24/2015  . Periprosthetic fracture around internal prosthetic left hip joint ([facility]12/24/2016  . Fall at home   . DVT (deep venous thrombosis) ([facility]07/09/2013  . Occult blood positive stool 01/30/2014  . Thrombocytopenia, unspecified ([facility]06/03/2014  . Anemia, iron deficiency 01/06/2014  . Hip fracture, left ([facility]06/12/2013  . QT prolongation 01/03/2014  . Atherosclerotic cardiovascular disease 08/13/2013  . Hypertensive emergency 04/08/2013  . Dizzy 04/08/2013  . PAT (paroxysmal atrial tachycardia) ([facility]05/28/2014  . SSS (sick sinus syndrome) with PAT and marked bradycardia with 3 sec pauses  08/30/2012  . Status post placement of cardiac pacemaker. 08/29/12 St. Jude device 08/30/2012  . HTN (hypertension) 08/30/2012  .  Hyperlipidemia 08/30/2012  . Osteoporosis, unspecified 04/19/2012  . Essential and other specified forms of tremor 04/19/2012  . Essential hypertension 04/19/2012  . Cardiac dysrhythmia, unspecified 04/19/2012  .  Chronic ischemic heart disease 04/19/2012  . Hereditary and idiopathic peripheral neuropathy 04/19/2012  . Memory loss 04/19/2012  . Disturbance of skin sensation 04/19/2012  . Pain in joint, shoulder region 11/20/2011  . Status post complete repair of rotator cuff 11/20/2011  . Muscle weakness (generalized) 11/20/2011   Ihor Austin, [place]; [place]  Aldona Lento 02/10/2016, 2:02 PM  [facility] [place] [address] [place], [place], [address] Phone: [phone]   Fax:  [phone]  Name: YALONDA SAMPLE MRN: [id number] Date of Birth: Nov 07, 1920

## 2016-02-15 ENCOUNTER — Ambulatory Visit (HOSPITAL_COMMUNITY): Payer: Medicare Other | Admitting: Physical Therapy

## 2016-02-15 DIAGNOSIS — R262 Difficulty in walking, not elsewhere classified: Secondary | ICD-10-CM

## 2016-02-15 DIAGNOSIS — M6281 Muscle weakness (generalized): Secondary | ICD-10-CM

## 2016-02-15 DIAGNOSIS — R2681 Unsteadiness on feet: Secondary | ICD-10-CM

## 2016-02-15 DIAGNOSIS — M25552 Pain in left hip: Secondary | ICD-10-CM

## 2016-02-15 NOTE — Therapy (Signed)
[facility] [address] [place], [place], [address] Phone: [phone]   Fax:  [phone]  Physical Therapy Treatment (discharge)  Patient Details  Name: Pamela [name] MRN: [id number] Date of Birth: 05-Jan-1921 Referring Provider: Rod Can  Encounter Date: 02/15/2016      PT End of Session - 02/15/16 1353    Visit Number 12   Number of Visits 12   Authorization Type BCBS Medicare (G-codes done 2022-10-11 session)   Authorization Time Period 12/23/15 to 02/22/16   Authorization - Visit Number 12   Authorization - Number of Visits 17   PT Start Time 5498   PT Stop Time 1342   PT Time Calculation (min) 39 min   Behavior During Therapy [facility] for tasks assessed/performed      Past Medical History  Diagnosis Date  . Pacemaker 08/29/2012    st jude accent DR RF device, model number [id number], serial number [id number], implanted 08/29/2012 for sinus bradycardia, runs of supraventricular tachycardia  last checked 10/23/2012  . ICD (implantable cardiac defibrillator) in place   . SSS (sick sinus syndrome) with PAT and marked bradycardia with 3 sec pauses  08/30/2012  . Status post placement of cardiac pacemaker. 08/29/12 St. Jude device 08/30/2012  . HTN (hypertension) 08/30/2012  . Hyperlipidemia 08/30/2012  . Polyneuropathy in other diseases classified elsewhere ([place])   . Macular degeneration   . Coronary artery disease     stent in the LAD artery in 2002  . Venous insufficiency     Past Surgical History  Procedure Laterality Date  . Cardiac catheterization  2006    3 stents placed  . Insert / replace / remove pacemaker  08/29/2012    st jude   . Rotator cuff repair Left   . Abdominal hysterectomy    . Cesarean section    . Kidney stone surgery    . Rotator cuff repair Right   . Cardiac catheterization  06/2001    placement of BiodivYsio 2.5.10mm stent dilated to 2.59m in proximal left anterior descenting stenotic lesion  .  Radiofrequency ablation  08/25/2010    ablation of left greater saphenous vein  . Hip arthroplasty Left 01/04/2014    Procedure: ARTHROPLASTY BIPOLAR HIP;  Surgeon: MMauri Pole MD;  [place]  Service: Orthopedics;  Laterality: Left;  . Permanent pacemaker insertion N/A 08/29/2012    Procedure: PERMANENT PACEMAKER INSERTION;  Surgeon: MSanda Klein MD;  [facility];  Service: Cardiovascular;  Laterality: N/A;  . Total hip revision Left 07/24/2015    Procedure: ORIF LEFT PERIPROSTHETIC FEMUR FRACTURE; CONVERSION TO TOTAL HIP ARTHROPLASTY POSS. FEMORAL COMPONENT REVISION ;  Surgeon: BRod Can MD;  [facility]  Service: Orthopedics;  Laterality: Left;    There were no vitals filed for this visit.      Subjective Assessment - 02/15/16 1307    Subjective Patient states that she feels like she is definitely improving, she feels she still has some work to do but states that on a subjetive scale she would rate herself as being 90+/100. She feels more comfortable with her balance. No falls or close calls recently, and she is OK with today potentially being her last day.    Pertinent History On beta-blockers, HTN, extensive cardiac history (stents and pacemaker), history of DVT, history of falls, osteroporsis, L hip replacement in 2015/revision in 2016 posterior approach    How long can you sit comfortably? 7/18- unlimited    How long  can you stand comfortably? 7/18- still over 30 minutes    How long can you walk comfortably? 7/18- has not tried walking long distances outside yet    Patient Stated Goals get strength back, be able to walk without assistive device    Currently in Pain? Yes   Pain Score 1    Pain Location Back   Pain Orientation Mid;Lower   Pain Descriptors / Indicators Nagging   Pain Type Acute pain   Pain Radiating Towards none    Pain Onset Today   Pain Frequency Constant   Aggravating Factors  none    Pain Relieving Factors none   Effect of Pain  on Daily Activities none             [facility] PT Assessment - 02/15/16 0001    Observation/Other Assessments   Focus on Therapeutic Outcomes (FOTO)  34% limited    Strength   Right Hip Flexion 3/5   Right Hip ABduction 4-/5   Left Hip Flexion 3+/5   Left Hip ABduction 3/5   Right Knee Flexion 4+/5   Right Knee Extension 5/5   Left Knee Flexion 4+/5   Left Knee Extension 4+/5   Right Ankle Dorsiflexion 5/5   Left Ankle Dorsiflexion 5/5   Transfers   Five time sit to stand comments  16.1   6 minute walk test results    Aerobic Endurance Distance Walked 848   Endurance additional comments 6MWT no device    High Level Balance   High Level Balance Comments TUG 16                              PT Education - 02/15/16 1352    Education provided Yes   Education Details DC today, keep up with current HEP but work on being more consistent with it, use exercise bike at home    Person(s) Educated Patient   Methods Explanation   Comprehension Verbalized understanding          PT Short Term Goals - 02/15/16 1335    PT SHORT TERM GOAL #1   Title Patient to experience pain no more than 3/10 with all functional mobilty in order to improve overall QOL    Time 4   Status Achieved   PT SHORT TERM GOAL #2   Title Patient will demonstrate improved gait mechanics, including improved heel-toe sequence, full foot clearance with both LEs, minimal unsteadiness and shuffling, and improve gait speed in order to improve overall mobility and reduce fall risk    Time 4   Period Weeks   Status Partially Met   PT SHORT TERM GOAL #3   Title Patient to be independently able to list all 3 posterior hip precautions in order to maintain safety and integrity of L hip replacement during mobility    Period Weeks   Status Achieved   PT SHORT TERM GOAL #4   Title Patient to be independent in correctly and consistently performing appropriate HEP, to be updated PRN    Time 4   Period  Weeks   Status Partially Met           PT Long Term Goals - 02/15/16 1339    PT LONG TERM GOAL #1   Title Patient to demonstrate strength at least 4/5 in all tested muscle groups in order to reduce pain, improve mobiltiy, and reduce fall risk    Time 8   Period  Weeks   Status On-going   PT LONG TERM GOAL #2   Title Patient to be able to complete 5x sit to stand in 12 seconds or less in order to demonstrate improved B LE strength/power and improved mobiltiy    Time 8   Period Weeks   Status On-going   PT LONG TERM GOAL #3   Title Patient to be able to ambulate at least 965f during 6MWT in order to demonstrate improved gait tolerance and community mobiility    Time 8   Period Weeks   Status On-going   PT LONG TERM GOAL #4   Title Patient to be able to perform TUG test in 13 seconds with LRAD in order to demonstrate reduced fall risk    Time 8   Period Weeks   Status On-going               Plan - [id number]    Clinical Impression Statement Re-assessment performed today. Patient has made excellent progress with skilled PT services and continues to demonstrate improvement in all areas except for functional strength; she reports she is happy with her progress and rates herself as being 90+/100 on a subjective scale and feels ready to progress to a home program. Did not update HEP today due to patient not fully performing her current program, encouraged her to work on consistency and habit forming for current HEP along with exercise bike. DC today due to high level of function.    Rehab Potential Good   PT Treatment/Interventions ADLs/Self Care Home Management;Cryotherapy;DME Instruction;Gait training;Stair training;Functional mobility training;Therapeutic activities;Therapeutic exercise;Balance training;Neuromuscular re-education;Patient/family education;Manual techniques;Scar mobilization;Energy conservation;Taping   PT Next Visit Plan DC today due to high level of function     PT Home Exercise Plan no updates today   Consulted and Agree with Plan of Care Patient      Patient will benefit from skilled therapeutic intervention in order to improve the following deficits and impairments:  Abnormal gait, Decreased endurance, Decreased scar mobility, Decreased activity tolerance, Decreased strength, Pain, Decreased balance, Decreased mobility, Difficulty walking, Decreased coordination, Impaired flexibility, Decreased safety awareness  Visit Diagnosis: Pain in left hip  Muscle weakness (generalized)  Difficulty in walking, not elsewhere classified  Unsteadiness on feet       G-Codes - [id number]    Functional Assessment Tool Used Based on skilled clinical assessment of strength, gait, balance, functional activity tolerance, safety    Functional Limitation Mobility: Walking and moving around   Mobility: Walking and Moving Around Current Status (([id number] At least 20 percent but less than 40 percent impaired, limited or restricted   Mobility: Walking and Moving Around Goal Status [phone] At least 20 percent but less than 40 percent impaired, limited or restricted      Problem List Patient Active Problem List   Diagnosis Date Noted  . Urinary frequency 08/10/2015  . Postoperative anemia due to acute blood loss 07/27/2015  . SVT (supraventricular tachycardia) ([facility] 07/26/2015  . Femur fracture, left ([facility] 07/24/2015  . Chronic kidney disease 07/24/2015  . Periprosthetic fracture around internal prosthetic left hip joint (HC[facility] 07/24/2015  . Fall at home   . DVT (deep venous thrombosis) ([facility] 01/30/2014  . Occult blood positive stool 01/30/2014  . Thrombocytopenia, unspecified ([facility] 01/06/2014  . Anemia, iron deficiency 01/06/2014  . Hip fracture, left ([facility] [place] 01/03/2014  . QT prolongation 01/03/2014  . Atherosclerotic cardiovascular disease 08/13/2013  . Hypertensive emergency 04/08/2013  . Dizzy 04/08/2013  . PAT (paroxysmal atrial  tachycardia) ([place])  12/25/2012  . SSS (sick sinus syndrome) with PAT and marked bradycardia with 3 sec pauses  08/30/2012  . Status post placement of cardiac pacemaker. 08/29/12 St. Jude device 08/30/2012  . HTN (hypertension) 08/30/2012  . Hyperlipidemia 08/30/2012  . Osteoporosis, unspecified 04/19/2012  . Essential and other specified forms of tremor 04/19/2012  . Essential hypertension 04/19/2012  . Cardiac dysrhythmia, unspecified 04/19/2012  . Chronic ischemic heart disease 04/19/2012  . Hereditary and idiopathic peripheral neuropathy 04/19/2012  . Memory loss 04/19/2012  . Disturbance of skin sensation 04/19/2012  . Pain in joint, shoulder region 11/20/2011  . Status post complete repair of rotator cuff 11/20/2011  . Muscle weakness (generalized) 11/20/2011    PHYSICAL THERAPY DISCHARGE SUMMARY  Visits from Start of Care: 12  Current functional level related to goals / functional outcomes: Patient doing very well and reports she feels 90+/100 on a subjective scale; she reports that she is going to work on being more compliant with full HEP and exercise bike on her own, and feels ready for DC today.    Remaining deficits: Functional weakness, unsteadiness, posture, gait impairment    Education / Equipment: HEP/bike compliance, DC today  Plan: Patient agrees to discharge.  Patient goals were partially met. Patient is being discharged due to being pleased with the current functional level.  ?????        Deniece Ree PT, DPT [facility] [address] [place], [place], [address] Phone: [phone]   Fax:  [phone]  Name: Pamela [name] MRN: [id number] Date of Birth: 04/22/1921

## 2016-02-18 ENCOUNTER — Encounter (HOSPITAL_COMMUNITY): Payer: Medicare Other

## 2016-02-25 ENCOUNTER — Ambulatory Visit (INDEPENDENT_AMBULATORY_CARE_PROVIDER_SITE_OTHER): Payer: Medicare Other | Admitting: *Deleted

## 2016-02-25 DIAGNOSIS — I495 Sick sinus syndrome: Secondary | ICD-10-CM

## 2016-02-25 NOTE — Progress Notes (Signed)
Remote pacemaker transmission.   

## 2016-02-28 ENCOUNTER — Encounter: Payer: Self-pay | Admitting: Cardiology

## 2016-03-03 LAB — CUP PACEART REMOTE DEVICE CHECK
Battery Remaining Longevity: 137 mo
Battery Remaining Percentage: 95.5 %
Battery Voltage: 2.99 V
Brady Statistic AS VP Percent: 1 %
Brady Statistic AS VS Percent: 88 %
Implantable Lead Implant Date: 20140414
Implantable Lead Location: 753859
Implantable Lead Location: 753860
Lead Channel Impedance Value: 330 Ohm
Lead Channel Pacing Threshold Amplitude: 0.5 V
Lead Channel Pacing Threshold Pulse Width: 0.4 ms
Lead Channel Pacing Threshold Pulse Width: 0.4 ms
Lead Channel Sensing Intrinsic Amplitude: 3 mV
Lead Channel Sensing Intrinsic Amplitude: 5.9 mV
Lead Channel Setting Pacing Amplitude: 1 V
Lead Channel Setting Pacing Amplitude: 1.5 V
MDC IDC LEAD IMPLANT DT: 20140414
MDC IDC MSMT LEADCHNL RV IMPEDANCE VALUE: 410 Ohm
MDC IDC MSMT LEADCHNL RV PACING THRESHOLD AMPLITUDE: 0.75 V
MDC IDC PG SERIAL: 7437302
MDC IDC SESS DTM: 20170728060752
MDC IDC SET LEADCHNL RV PACING PULSEWIDTH: 0.4 ms
MDC IDC SET LEADCHNL RV SENSING SENSITIVITY: 2 mV
MDC IDC STAT BRADY AP VP PERCENT: 1 %
MDC IDC STAT BRADY AP VS PERCENT: 12 %
MDC IDC STAT BRADY RA PERCENT PACED: 11 %
MDC IDC STAT BRADY RV PERCENT PACED: 1 %

## 2016-03-20 DIAGNOSIS — E782 Mixed hyperlipidemia: Secondary | ICD-10-CM | POA: Diagnosis not present

## 2016-03-20 DIAGNOSIS — Z6824 Body mass index (BMI) 24.0-24.9, adult: Secondary | ICD-10-CM | POA: Diagnosis not present

## 2016-03-20 DIAGNOSIS — I10 Essential (primary) hypertension: Secondary | ICD-10-CM | POA: Diagnosis not present

## 2016-03-20 DIAGNOSIS — Z1389 Encounter for screening for other disorder: Secondary | ICD-10-CM | POA: Diagnosis not present

## 2016-03-21 DIAGNOSIS — L821 Other seborrheic keratosis: Secondary | ICD-10-CM | POA: Diagnosis not present

## 2016-03-21 DIAGNOSIS — B078 Other viral warts: Secondary | ICD-10-CM | POA: Diagnosis not present

## 2016-03-29 ENCOUNTER — Ambulatory Visit (HOSPITAL_COMMUNITY)
Admission: RE | Admit: 2016-03-29 | Discharge: 2016-03-29 | Disposition: A | Discharge status: Home / Self Care | Payer: Medicare Other | Source: Ambulatory Visit | Attending: Internal Medicine | Admitting: Internal Medicine

## 2016-03-29 ENCOUNTER — Other Ambulatory Visit (HOSPITAL_COMMUNITY): Payer: Self-pay | Admitting: Internal Medicine

## 2016-03-29 DIAGNOSIS — Z6824 Body mass index (BMI) 24.0-24.9, adult: Secondary | ICD-10-CM | POA: Diagnosis not present

## 2016-03-29 DIAGNOSIS — Z1389 Encounter for screening for other disorder: Secondary | ICD-10-CM | POA: Diagnosis not present

## 2016-03-29 DIAGNOSIS — S52691A Other fracture of lower end of right ulna, initial encounter for closed fracture: Secondary | ICD-10-CM | POA: Diagnosis not present

## 2016-03-29 DIAGNOSIS — S5291XA Unspecified fracture of right forearm, initial encounter for closed fracture: Secondary | ICD-10-CM | POA: Diagnosis not present

## 2016-03-29 DIAGNOSIS — Z029 Encounter for administrative examinations, unspecified: Secondary | ICD-10-CM | POA: Insufficient documentation

## 2016-03-30 ENCOUNTER — Ambulatory Visit: Payer: Medicare Other | Admitting: Orthopaedic Surgery

## 2016-03-31 ENCOUNTER — Encounter (HOSPITAL_COMMUNITY): Payer: Self-pay | Admitting: *Deleted

## 2016-03-31 ENCOUNTER — Telehealth: Payer: Self-pay | Admitting: Orthopaedic Surgery

## 2016-03-31 DIAGNOSIS — S52571A Other intraarticular fracture of lower end of right radius, initial encounter for closed fracture: Secondary | ICD-10-CM | POA: Diagnosis not present

## 2016-03-31 NOTE — Telephone Encounter (Signed)
I spoke to someone from Dr. Sharyon MedicusFusco's office on Thursday, August 31st regarding scheduling this patient who had a fractured wrist.  They requested an appointment to see Dr. Romeo AppleHarrison but he did not have an openings.  I offered an appointment immediately with Dr. Hilda LiasKeeling.  Initially the appointment was declined but then they stated that she would take the appointment.  Patient was a no show.  I have faxed a note to Dr. Sharyon MedicusFusco's office letting them know that the patient did not show up and asked if they referred her somewhere else.  I asked them to please advise us as to whether or not to reschedule the patient with our office.

## 2016-03-31 NOTE — H&P (Signed)
Pamela [name] is an 82 y.o. female.   Chief Complaint: right wrist injury HPI: Pt seen/evaluated in office Pt with fall on outstretched right wrist Pt with injury to right wrist Pt presented with pain and deformity to office Pt here for surgery No prior surgery to right wrist  Past Medical History:  Diagnosis Date  . Coronary artery disease    stent in the LAD artery in 2002  . HTN (hypertension) 08/30/2012  . Hyperlipidemia 08/30/2012  . ICD (implantable cardiac defibrillator) in place   . Macular degeneration   . Pacemaker 08/29/2012   st jude accent DR RF device, model number [id number], serial number [id number], implanted 08/29/2012 for sinus bradycardia, runs of supraventricular tachycardia  last checked 10/23/2012  . Polyneuropathy in other diseases classified elsewhere (HCC)   . SSS (sick sinus syndrome) with PAT and marked bradycardia with 3 sec pauses  08/30/2012  . Status post placement of cardiac pacemaker. 08/29/12 St. Jude device 08/30/2012  . Venous insufficiency     Past Surgical History:  Procedure Laterality Date  . ABDOMINAL HYSTERECTOMY    . CARDIAC CATHETERIZATION  2006   3 stents placed  . CARDIAC CATHETERIZATION  06/2001   placement of BiodivYsio 2.5.10mm stent dilated to 2.1875mm in proximal left anterior descenting stenotic lesion  . CESAREAN SECTION    . HIP ARTHROPLASTY Left 01/04/2014   Procedure: ARTHROPLASTY BIPOLAR HIP;  Surgeon: Shelda PalMatthew D Olin, MD;  [facility];  Service: Orthopedics;  Laterality: Left;  . INSERT / REPLACE / REMOVE PACEMAKER  08/29/2012   st jude   . KIDNEY STONE SURGERY    . PERMANENT PACEMAKER INSERTION N/A 08/29/2012   Procedure: PERMANENT PACEMAKER INSERTION;  Surgeon: Thurmon FairMihai Croitoru, MD;  Location: [facility] CATH LAB;  Service: Cardiovascular;  Laterality: N/A;  . RADIOFREQUENCY ABLATION  08/25/2010   ablation of left greater saphenous vein  . ROTATOR CUFF REPAIR Left   . ROTATOR CUFF REPAIR Right   . TOTAL HIP REVISION Left 07/24/2015   Procedure: ORIF LEFT PERIPROSTHETIC FEMUR FRACTURE; CONVERSION TO TOTAL HIP ARTHROPLASTY POSS. FEMORAL COMPONENT REVISION ;  Surgeon: Samson FredericBrian Swinteck, MD;  [facility];  Service: Orthopedics;  Laterality: Left;    Family History  Problem Relation Age of Onset  . Heart disease Mother   . Congestive Heart Failure Mother   . Heart disease Father   . Heart attack Father   . Heart disease Brother    Social History:  reports that she has never smoked. She has never used smokeless tobacco. She reports that she does not drink alcohol or use drugs.  Allergies:  Allergies  Allergen Reactions  . Gabapentin Other (See Comments)    Blurred vision    No prescriptions prior to admission.    No results found for this or any previous visit (from the past 48 hour(s)). Dg Wrist Complete Right  Result Date: 03/29/2016 CLINICAL DATA:  Right wrist pain and swelling after fall today. Initial encounter. EXAM: RIGHT WRIST - COMPLETE 3+ VIEW COMPARISON:  None. FINDINGS: The transverse fracture of the distal radial metaphysis demonstrates approximately 30 degrees of dorsal angulation. The medial distal ulnar fracture is present as well with involvement of the ulnar styloid. Extensive soft tissue swelling is present. Carpal bones are intact. The proximal hand is unremarkable. IMPRESSION: 1. Distal radial metaphysis heel fracture with dorsal angulation. 2. Medial distal ulnar fracture with involvement of the ulnar styloid. Electronically Signed   By: Marin Robertshristopher  Mattern M.D.   On: 03/29/2016 18:02  ROS: NO RECENT ILLNESSES OR HOSPITALIZATIONS  There were no vitals taken for this visit. Physical Exam  General Appearance:  Alert, cooperative, no distress, appears stated age  Head:  Normocephalic, without obvious abnormality, atraumatic  Eyes:  Pupils equal, conjunctiva/corneas clear,         Throat: Lips, mucosa, and tongue normal; teeth and gums normal  Neck: No visible masses     Lungs:    respirations unlabored  Chest Wall:  No tenderness or deformity  Heart:  Regular rate and rhythm,  Abdomen:   Soft, non-tender,         Extremities: RUE: MODERATE ECCHYMOSIS AND SWELLING FINGERS WARM WELL PERFUSED ABLE TO EXTEND THUMB AND FINGERS FINGERS WARM WELL PERFUSED  Pulses: 2+ and symmetric  Skin: Skin color, texture, turgor normal, no rashes or lesions     Neurologic: Normal    Assessment/Plan RIGHT COMMINUTED DISTAL RADIUS FRACTURE, DISPLACED  RIGHT COMMINUTED DISTAL RADIUS FRACTURE OPEN REDUCTION AND INTERNAL FIXATION AND REPAIR AS INDICATED  R/B/A DISCUSSED WITH PT IN OFFICE.  PT VOICED UNDERSTANDING OF PLAN CONSENT SIGNED DAY OF SURGERY PT SEEN AND EXAMINED PRIOR TO OPERATIVE PROCEDURE/DAY OF SURGERY SITE MARKED. QUESTIONS ANSWERED WILL GO HOME FOLLOWING SURGERY  WE ARE PLANNING SURGERY FOR YOUR UPPER EXTREMITY. THE RISKS AND BENEFITS OF SURGERY INCLUDE BUT NOT LIMITED TO BLEEDING INFECTION, DAMAGE TO NEARBY NERVES ARTERIES TENDONS, FAILURE OF SURGERY TO ACCOMPLISH ITS INTENDED GOALS, PERSISTENT SYMPTOMS AND NEED FOR FURTHER SURGICAL INTERVENTION. WITH THIS IN MIND WE WILL PROCEED. I HAVE DISCUSSED WITH THE PATIENT THE PRE AND POSTOPERATIVE REGIMEN AND THE DOS AND DON'TS. PT VOICED UNDERSTANDING AND INFORMED CONSENT SIGNED.  [name] 03/31/2016, 4:51 PM

## 2016-03-31 NOTE — Anesthesia Preprocedure Evaluation (Addendum)
Anesthesia Evaluation  Patient identified by MRN, date of birth, ID band Patient awake    Reviewed: Allergy & Precautions, NPO status , Patient's Chart, lab work & pertinent test results  History of Anesthesia Complications Negative for: history of anesthetic complications  Airway Mallampati: I  TM Distance: >3 FB Neck ROM: Full    Dental  (+) Teeth Intact, Dental Advisory Given   Pulmonary neg pulmonary ROS,    breath sounds clear to auscultation       Cardiovascular hypertension, Pt. on home beta blockers and Pt. on medications + CAD and + Peripheral Vascular Disease  + pacemaker  Rhythm:Regular Rate:Normal     Neuro/Psych  Neuromuscular disease negative psych ROS   GI/Hepatic negative GI ROS, Neg liver ROS,   Endo/Other  negative endocrine ROS  Renal/GU      Musculoskeletal   Abdominal   Peds  Hematology  (+) anemia ,   Anesthesia Other Findings   Reproductive/Obstetrics                             Anesthesia Physical  Anesthesia Plan  ASA: III  Anesthesia Plan: MAC and Regional   Post-op Pain Management:    Induction: Intravenous  Airway Management Planned: Natural Airway and Simple Face Mask  Additional Equipment:   Intra-op Plan:   Post-operative Plan:   Informed Consent: I have reviewed the patients History and Physical, chart, labs and discussed the procedure including the risks, benefits and alternatives for the proposed anesthesia with the patient or authorized representative who has indicated his/her understanding and acceptance.   Dental advisory given  Plan Discussed with: CRNA and Anesthesiologist  Anesthesia Plan Comments:        Anesthesia Quick Evaluation

## 2016-04-01 ENCOUNTER — Encounter (HOSPITAL_COMMUNITY)
Admission: RE | Disposition: A | Discharge status: Home / Self Care | Payer: Self-pay | Source: Ambulatory Visit | Attending: Orthopedic Surgery

## 2016-04-01 ENCOUNTER — Ambulatory Visit (HOSPITAL_COMMUNITY)
Admission: RE | Admit: 2016-04-01 | Discharge: 2016-04-01 | Disposition: A | Discharge status: Home / Self Care | Payer: Medicare Other | Source: Ambulatory Visit | Attending: Orthopedic Surgery | Admitting: Orthopedic Surgery

## 2016-04-01 ENCOUNTER — Encounter (HOSPITAL_COMMUNITY): Payer: Self-pay | Admitting: *Deleted

## 2016-04-01 ENCOUNTER — Ambulatory Visit (HOSPITAL_COMMUNITY): Payer: Medicare Other | Admitting: Anesthesiology

## 2016-04-01 DIAGNOSIS — Z96642 Presence of left artificial hip joint: Secondary | ICD-10-CM | POA: Diagnosis not present

## 2016-04-01 DIAGNOSIS — Z79899 Other long term (current) drug therapy: Secondary | ICD-10-CM | POA: Diagnosis not present

## 2016-04-01 DIAGNOSIS — D649 Anemia, unspecified: Secondary | ICD-10-CM | POA: Insufficient documentation

## 2016-04-01 DIAGNOSIS — I2510 Atherosclerotic heart disease of native coronary artery without angina pectoris: Secondary | ICD-10-CM | POA: Insufficient documentation

## 2016-04-01 DIAGNOSIS — Z955 Presence of coronary angioplasty implant and graft: Secondary | ICD-10-CM | POA: Insufficient documentation

## 2016-04-01 DIAGNOSIS — Z95810 Presence of automatic (implantable) cardiac defibrillator: Secondary | ICD-10-CM | POA: Diagnosis not present

## 2016-04-01 DIAGNOSIS — G8918 Other acute postprocedural pain: Secondary | ICD-10-CM | POA: Diagnosis not present

## 2016-04-01 DIAGNOSIS — S52571A Other intraarticular fracture of lower end of right radius, initial encounter for closed fracture: Secondary | ICD-10-CM | POA: Insufficient documentation

## 2016-04-01 DIAGNOSIS — Z7982 Long term (current) use of aspirin: Secondary | ICD-10-CM | POA: Diagnosis not present

## 2016-04-01 DIAGNOSIS — I10 Essential (primary) hypertension: Secondary | ICD-10-CM | POA: Insufficient documentation

## 2016-04-01 DIAGNOSIS — W19XXXA Unspecified fall, initial encounter: Secondary | ICD-10-CM | POA: Diagnosis not present

## 2016-04-01 HISTORY — PX: OPEN REDUCTION INTERNAL FIXATION (ORIF) DISTAL RADIAL FRACTURE: SHX5989

## 2016-04-01 LAB — CBC
HEMATOCRIT: 36 % (ref 36.0–46.0)
Hemoglobin: 10.8 g/dL — ABNORMAL LOW (ref 12.0–15.0)
MCH: 29.3 pg (ref 26.0–34.0)
MCHC: 30 g/dL (ref 30.0–36.0)
MCV: 97.6 fL (ref 78.0–100.0)
PLATELETS: 119 10*3/uL — AB (ref 150–400)
RBC: 3.69 MIL/uL — AB (ref 3.87–5.11)
RDW: 14.2 % (ref 11.5–15.5)
WBC: 4.4 10*3/uL (ref 4.0–10.5)

## 2016-04-01 LAB — BASIC METABOLIC PANEL
Anion gap: 7 (ref 5–15)
BUN: 15 mg/dL (ref 6–20)
CHLORIDE: 106 mmol/L (ref 101–111)
CO2: 28 mmol/L (ref 22–32)
CREATININE: 0.78 mg/dL (ref 0.44–1.00)
Calcium: 9.8 mg/dL (ref 8.9–10.3)
GFR calc non Af Amer: >60 mL/min (ref >60)
Glucose, Bld: 110 mg/dL — ABNORMAL HIGH (ref 65–99)
POTASSIUM: 3.6 mmol/L (ref 3.5–5.1)
SODIUM: 141 mmol/L (ref 135–145)

## 2016-04-01 SURGERY — OPEN REDUCTION INTERNAL FIXATION (ORIF) DISTAL RADIUS FRACTURE
Anesthesia: General | Site: Arm Lower | Laterality: Right

## 2016-04-01 MED ORDER — HYDROCODONE-ACETAMINOPHEN 5-300 MG PO TABS: 1.0000 | ORAL_TABLET | Freq: Four times a day (QID) | ORAL | 0 refills | Status: DC | PRN

## 2016-04-01 MED ORDER — LIDOCAINE 2% (20 MG/ML) 5 ML SYRINGE
INTRAMUSCULAR | Status: DC | PRN
  Administered 2016-04-01: 40 mg via INTRAVENOUS

## 2016-04-01 MED ORDER — VITAMIN C 500 MG PO TABS: 500.0000 mg | ORAL_TABLET | Freq: Every day | ORAL | 0 refills | Status: DC

## 2016-04-01 MED ORDER — DOCUSATE SODIUM 100 MG PO CAPS: 100.0000 mg | ORAL_CAPSULE | Freq: Two times a day (BID) | ORAL | 0 refills | Status: AC

## 2016-04-01 MED ORDER — FENTANYL CITRATE (PF) 100 MCG/2ML IJ SOLN
INTRAMUSCULAR | Status: AC
  Filled 2016-04-01: qty 2

## 2016-04-01 MED ORDER — CEFAZOLIN SODIUM-DEXTROSE 2-4 GM/100ML-% IV SOLN
2.0000 g | INTRAVENOUS | Status: AC
  Administered 2016-04-01: 2 g via INTRAVENOUS
  Filled 2016-04-01: qty 100

## 2016-04-01 MED ORDER — ONDANSETRON HCL 4 MG/2ML IJ SOLN
INTRAMUSCULAR | Status: DC | PRN
  Administered 2016-04-01: 4 mg via INTRAVENOUS

## 2016-04-01 MED ORDER — PROPOFOL 10 MG/ML IV BOLUS
INTRAVENOUS | Status: AC
  Filled 2016-04-01: qty 20

## 2016-04-01 MED ORDER — 0.9 % SODIUM CHLORIDE (POUR BTL) OPTIME
TOPICAL | Status: DC | PRN
  Administered 2016-04-01: 1000 mL

## 2016-04-01 MED ORDER — CHLORHEXIDINE GLUCONATE 4 % EX LIQD: 60.0000 mL | Freq: Once | CUTANEOUS | Status: DC

## 2016-04-01 MED ORDER — LIDOCAINE 2% (20 MG/ML) 5 ML SYRINGE
INTRAMUSCULAR | Status: AC
  Filled 2016-04-01: qty 5

## 2016-04-01 MED ORDER — ROPIVACAINE HCL 7.5 MG/ML IJ SOLN
INTRAMUSCULAR | Status: DC | PRN
  Administered 2016-04-01: 20 mL via PERINEURAL

## 2016-04-01 MED ORDER — ONDANSETRON HCL 4 MG/2ML IJ SOLN
INTRAMUSCULAR | Status: AC
  Filled 2016-04-01: qty 2

## 2016-04-01 MED ORDER — LACTATED RINGERS IV SOLN
INTRAVENOUS | Status: DC
  Administered 2016-04-01: 07:00:00 via INTRAVENOUS

## 2016-04-01 MED ORDER — PROPOFOL 10 MG/ML IV BOLUS
INTRAVENOUS | Status: DC | PRN
  Administered 2016-04-01: 20 mg via INTRAVENOUS

## 2016-04-01 MED ORDER — CARVEDILOL 3.125 MG PO TABS
3.1250 mg | ORAL_TABLET | ORAL | Status: AC
  Administered 2016-04-01: 3.125 mg via ORAL
  Filled 2016-04-01 (×2): qty 1

## 2016-04-01 MED ORDER — FENTANYL CITRATE (PF) 100 MCG/2ML IJ SOLN
INTRAMUSCULAR | Status: DC | PRN
  Administered 2016-04-01: 100 ug via INTRAVENOUS

## 2016-04-01 MED ORDER — PROPOFOL 500 MG/50ML IV EMUL
INTRAVENOUS | Status: DC | PRN
  Administered 2016-04-01: 25 ug/kg/min via INTRAVENOUS

## 2016-04-01 SURGICAL SUPPLY — 64 items
BANDAGE ACE 4X5 VEL STRL LF (GAUZE/BANDAGES/DRESSINGS) ×2 IMPLANT
BANDAGE ELASTIC 3 VELCRO ST LF (GAUZE/BANDAGES/DRESSINGS) ×2 IMPLANT
BANDAGE ELASTIC 4 VELCRO ST LF (GAUZE/BANDAGES/DRESSINGS) ×1 IMPLANT
BIT DRILL 2.2 SS TIBIAL (BIT) ×1 IMPLANT
BLADE SURG ROTATE 9660 (MISCELLANEOUS) IMPLANT
BNDG CMPR 9X4 STRL LF SNTH (GAUZE/BANDAGES/DRESSINGS) ×1
BNDG ESMARK 4X9 LF (GAUZE/BANDAGES/DRESSINGS) ×2 IMPLANT
BNDG GAUZE ELAST 4 BULKY (GAUZE/BANDAGES/DRESSINGS) ×2 IMPLANT
BRUSH SCRUB EZ PLAIN DRY (MISCELLANEOUS) ×1 IMPLANT
CANISTER SUCTION 2500CC (MISCELLANEOUS) ×2 IMPLANT
CORDS BIPOLAR (ELECTRODE) ×2 IMPLANT
COVER SURGICAL LIGHT HANDLE (MISCELLANEOUS) ×2 IMPLANT
CUFF TOURNIQUET SINGLE 18IN (TOURNIQUET CUFF) ×2 IMPLANT
CUFF TOURNIQUET SINGLE 24IN (TOURNIQUET CUFF) IMPLANT
DECANTER SPIKE VIAL GLASS SM (MISCELLANEOUS) ×2 IMPLANT
DRAPE OEC MINIVIEW 54X84 (DRAPES) ×2 IMPLANT
DRAPE SURG 17X11 SM STRL (DRAPES) ×2 IMPLANT
DRSG ADAPTIC 3X8 NADH LF (GAUZE/BANDAGES/DRESSINGS) ×2 IMPLANT
GAUZE SPONGE 4X4 12PLY STRL (GAUZE/BANDAGES/DRESSINGS) ×2 IMPLANT
GAUZE SPONGE 4X4 16PLY XRAY LF (GAUZE/BANDAGES/DRESSINGS) ×2 IMPLANT
GLOVE BIOGEL PI IND STRL 8.5 (GLOVE) ×1 IMPLANT
GLOVE BIOGEL PI INDICATOR 8.5 (GLOVE) ×1
GLOVE SURG ORTHO 8.0 STRL STRW (GLOVE) ×2 IMPLANT
GOWN STRL REUS W/ TWL LRG LVL3 (GOWN DISPOSABLE) ×1 IMPLANT
GOWN STRL REUS W/ TWL XL LVL3 (GOWN DISPOSABLE) ×1 IMPLANT
GOWN STRL REUS W/TWL LRG LVL3 (GOWN DISPOSABLE) ×2
GOWN STRL REUS W/TWL XL LVL3 (GOWN DISPOSABLE) ×2
K-WIRE 1.6 (WIRE) ×2
K-WIRE FX5X1.6XNS BN SS (WIRE) ×1
KIT BASIN OR (CUSTOM PROCEDURE TRAY) ×2 IMPLANT
KIT ROOM TURNOVER OR (KITS) ×2 IMPLANT
KWIRE FX5X1.6XNS BN SS (WIRE) IMPLANT
NDL HYPO 25X1 1.5 SAFETY (NEEDLE) ×1 IMPLANT
NEEDLE HYPO 25X1 1.5 SAFETY (NEEDLE) ×2 IMPLANT
NS IRRIG 1000ML POUR BTL (IV SOLUTION) ×2 IMPLANT
PACK ORTHO EXTREMITY (CUSTOM PROCEDURE TRAY) ×2 IMPLANT
PAD ARMBOARD 7.5X6 YLW CONV (MISCELLANEOUS) ×4 IMPLANT
PAD CAST 4YDX4 CTTN HI CHSV (CAST SUPPLIES) ×1 IMPLANT
PADDING CAST COTTON 4X4 STRL (CAST SUPPLIES) ×2
PEG LOCKING SMOOTH 2.2X20 (Screw) ×6 IMPLANT
PLATE DVR CROSSLOCK STD RT (Plate) ×1 IMPLANT
SCREW LOCK 14X2.7X 3 LD TPR (Screw) IMPLANT
SCREW LOCK 16X2.7X 3 LD TPR (Screw) IMPLANT
SCREW LOCK 18X2.7X 3 LD TPR (Screw) IMPLANT
SCREW LOCKING 2.7X13MM (Screw) ×1 IMPLANT
SCREW LOCKING 2.7X14 (Screw) ×6 IMPLANT
SCREW LOCKING 2.7X16 (Screw) ×2 IMPLANT
SCREW LOCKING 2.7X18 (Screw) ×2 IMPLANT
SOAP 2 % CHG 4 OZ (WOUND CARE) ×2 IMPLANT
SPLINT PLASTER CAST XFAST 3X15 (CAST SUPPLIES) IMPLANT
SPLINT PLASTER XTRA FASTSET 3X (CAST SUPPLIES) ×1
SPONGE LAP 4X18 X RAY DECT (DISPOSABLE) ×2 IMPLANT
SUT VIC AB 2-0 CT1 27 (SUTURE) ×2
SUT VIC AB 2-0 CT1 TAPERPNT 27 (SUTURE) IMPLANT
SUT VIC AB 2-0 FS1 27 (SUTURE) ×1 IMPLANT
SUT VIC AB 3-0 FS2 27 (SUTURE) ×1 IMPLANT
SUT VICRYL 4-0 PS2 18IN ABS (SUTURE) IMPLANT
SUT VICRYL RAPIDE 4/0 PS 2 (SUTURE) ×2 IMPLANT
SYR CONTROL 10ML LL (SYRINGE) IMPLANT
TOWEL OR 17X24 6PK STRL BLUE (TOWEL DISPOSABLE) ×2 IMPLANT
TOWEL OR 17X26 10 PK STRL BLUE (TOWEL DISPOSABLE) ×2 IMPLANT
TUBE CONNECTING 12X1/4 (SUCTIONS) ×2 IMPLANT
WATER STERILE IRR 1000ML POUR (IV SOLUTION) ×2 IMPLANT
YANKAUER SUCT BULB TIP NO VENT (SUCTIONS) ×1 IMPLANT

## 2016-04-01 NOTE — Transfer of Care (Signed)
Immediate Anesthesia Transfer of Care Note  Patient: Pamela [name]  Procedure(s) Performed: Procedure(s): OPEN REDUCTION INTERNAL FIXATION (ORIF) RIGHT DISTAL RADIAL FRACTURE AND REPAIR AS INDICATED (Right)  Patient Location: PACU  Anesthesia Type:MAC and MAC combined with regional for post-op pain  Level of Consciousness: awake and alert   Airway & Oxygen Therapy: Patient Spontanous Breathing  Post-op Assessment: Report given to RN and Post -op Vital signs reviewed and stable  Post vital signs: Reviewed and stable  Last Vitals:  Vitals:   04/01/16 0640  BP: (!) 185/62  Pulse: 76  Resp: 20  Temp: 36.8 C    Last Pain:  Vitals:   04/01/16 0640  TempSrc: Oral  PainSc: 8       Patients Stated Pain Goal: 4 (04/01/16 0640)  Complications: No apparent anesthesia complications

## 2016-04-01 NOTE — Discharge Instructions (Signed)
KEEP BANDAGE CLEAN AND DRY °CALL OFFICE FOR F/U APPT [phone] in 14 days °DR Ernest Popowski CELL [phone] °KEEP HAND ELEVATED ABOVE HEART °OK TO APPLY ICE TO OPERATIVE AREA °CONTACT OFFICE IF ANY WORSENING PAIN OR CONCERNS. °

## 2016-04-01 NOTE — Anesthesia Procedure Notes (Signed)
Anesthesia Regional Block:  Supraclavicular block  Pre-Anesthetic Checklist: ,, timeout performed, Correct Patient, Correct Site, Correct Laterality, Correct Procedure,, site marked, risks and benefits discussed, Surgical consent,  Pre-op evaluation,  At surgeon's request and post-op pain management  Laterality: Right  Prep: chloraprep       Needles:   Needle Type: Echogenic Stimulator Needle     Needle Length:cm 9 cm Needle Gauge: 21 G    Additional Needles:  Procedures: ultrasound guided (picture in chart) and nerve stimulator Supraclavicular block  Nerve Stimulator or Paresthesia:  Response: Bicep contraction, 0.45 mA,  Response: wrist extension, 0.45 mA,   Additional Responses:   Narrative:  Start time: 04/01/2016 7:11 AM End time: 04/01/2016 7:21 AM Injection made incrementally with aspirations every 5 mL.  Performed by: Personally   Additional Notes: Functioning IV was confirmed and monitors were applied. A 90mm 21ga Arrow stimulator needle was used.  Sterile prep and drape,hand hygiene and sterile gloves were used.  Negative aspiration of air or blood and negative test dose prior to incremental administration of local anesthetic. The patient tolerated the procedure well.

## 2016-04-01 NOTE — Anesthesia Postprocedure Evaluation (Signed)
Anesthesia Post Note  Patient: Pamela [name]  Procedure(s) Performed: Procedure(s) (LRB): OPEN REDUCTION INTERNAL FIXATION (ORIF) RIGHT DISTAL RADIAL FRACTURE AND REPAIR AS INDICATED (Right)  Patient location during evaluation: PACU Anesthesia Type: MAC Level of consciousness: awake and alert Pain management: pain level controlled Vital Signs Assessment: post-procedure vital signs reviewed and stable Respiratory status: spontaneous breathing and respiratory function stable Cardiovascular status: stable Anesthetic complications: no    Last Vitals:  Vitals:   04/01/16 0830 04/01/16 0900  BP: (!) 159/82 (!) 165/80  Pulse:  73  Resp: (!) 27 (!) 26  Temp: 36.7 C     Last Pain:  Vitals:   04/01/16 0640  TempSrc: Oral  PainSc: 8                  Vedanth Sirico DANIEL

## 2016-04-01 NOTE — Brief Op Note (Signed)
Op Note dictated.

## 2016-04-01 NOTE — Anesthesia Procedure Notes (Signed)
Procedure Name: MAC Date/Time: 04/01/2016 7:37 AM Performed by: Alanda AmassFRIEDMAN, Arella Blinder A Pre-anesthesia Checklist: Patient identified, Emergency Drugs available, Suction available, Patient being monitored and Timeout performed Oxygen Delivery Method: Non-rebreather mask

## 2016-04-02 NOTE — Op Note (Signed)
[facility]:  Sanderson, Lynnetta              ACCOUNT NO.:  [id number]  MEDICAL RECORD NO.:  [id number]  LOCATION:  [facility]                         FACILITY:  [facility]  PHYSICIAN:  Sharma CovertFred W. Jovonda Selner IV, M.D.DATE OF BIRTH:  1920/12/27  DATE OF PROCEDURE:  04/01/2016 DATE OF DISCHARGE:  04/01/2016                              OPERATIVE REPORT   PREOPERATIVE DIAGNOSIS:  Right wrist intra-articular distal radius fracture, tumor fragments.  POSTOPERATIVE DIAGNOSIS:  Right wrist intra-articular distal radius fracture, tumor fragments.  ATTENDING PHYSICIAN:  Sharma CovertFred W. Lesieli Bresee, M.D., who scrubbed and present for the entire procedure.  ASSISTANT SURGEON:  None.  ANESTHESIA:  Supraclavicular block with IV sedation.  SURGICAL PROCEDURE: 1. Open treatment of right wrist intra-articular distal radius     fracture, tumor fragments. 2. Right wrist brachioradialis tendon release.  RADIOGRAPH:  Three views, right wrist.  SURGICAL IMPLANTS:  Standard DVR cross lock.  SURGICAL INDICATIONS:  Ms. Rinaldo RatelGarrison is a right-hand-dominant female who sustained a closed distal radius fracture.  The patient seen and evaluated in the office and recommended to undergo the above procedure. Risks, benefits, and alternatives were discussed in detail with the patient.  Signed informed consent was obtained.  Risks include, but not limited to bleeding, infection, damage to nearby nerves, arteries, or tendons; loss of motion of wrist and digits, incomplete relief of symptoms, nonunion, malunion, hardware failure, need for further surgical intervention.  DESCRIPTION OF PROCEDURE:  The patient was properly identified in the preoperative holding area, marked with a permanent marker made on the right wrist to indicate correct operative site.  The patient was then brought back to the operating room, placed supine on the anesthesia table.  The block was then performed.  IV sedation was administered.  A well-padded tourniquet was placed  on the right brachium and sealed with 1000 drape.  Right upper extremity was then prepped and draped in a normal sterile fashion.  Time-out was called, correct side was identified, and procedure was begun.  Attention was then turned to the right wrist.  A longitudinal incision was made directly over the FCR sheath.  Dissection was carried down through the skin and subcutaneous tissue.  The FCR sheath was then opened proximally and distally.  Going through the floor of the FCR, the FPL was then carefully swept out of the way and pronator quadratus was then elevated.  An  elevation of pronator quadratus was then carried out and the fracture site was then exposed.  The brachioradialis was then carefully released off the radial styloid and tendon release and lengthening, tendon release was then carried out.  Tendon tenotomy.  The wound was then irrigated.  Open reduction was then carried out of the intra-articular fracture.  Once this was carried out, standard volar plate was then applied and held this over the K-wire and the oblong hole was then used and the screw was then placed and the plate height was then adjusted.  Following this, distal fixation was carried out from an ulnar to radial direction with the distal locking pegs.  Final screw fixation was then carried out in the shaft.  The wound was irrigated.  The pronator quadratus was closed with 2-0 Vicryl, subcutaneous  tissues were closed with 3-0 Vicryl. Tourniquet deflated.  Hemostasis obtained.  Skin was then closed using 4- 0 Vicryl deep sutures.  Adaptic dressing, sterile compressive bandage then applied.  The patient was placed in a well-padded sugar-tong splint.  Taken to recovery room in good condition.  POSTPROCEDURE PLAN:  The patient discharged to home ,seen back in the office in approximately 2 weeks for suture check, x-rays, application of short-arm cast for total of 4 weeks immobilization and then begin a therapy regimen  at 4-week mark.  Radiographs at each visit.     Madelynn Done, M.D.     FWO/MEDQ  D:  04/01/2016  T:  04/02/2016  Job:  [id number]

## 2016-04-04 ENCOUNTER — Encounter (HOSPITAL_COMMUNITY): Payer: Self-pay | Admitting: Orthopedic Surgery

## 2016-04-14 DIAGNOSIS — S52571D Other intraarticular fracture of lower end of right radius, subsequent encounter for closed fracture with routine healing: Secondary | ICD-10-CM | POA: Diagnosis not present

## 2016-04-27 ENCOUNTER — Ambulatory Visit (INDEPENDENT_AMBULATORY_CARE_PROVIDER_SITE_OTHER): Payer: Medicare Other | Admitting: Internal Medicine

## 2016-04-27 ENCOUNTER — Encounter (INDEPENDENT_AMBULATORY_CARE_PROVIDER_SITE_OTHER): Payer: Self-pay | Admitting: Internal Medicine

## 2016-04-27 VITALS — BP 146/70 | HR 64 | Temp 97.6°F | Ht 65.5 in | Wt 136.7 lb

## 2016-04-27 DIAGNOSIS — R197 Diarrhea, unspecified: Secondary | ICD-10-CM | POA: Diagnosis not present

## 2016-04-27 DIAGNOSIS — B349 Viral infection, unspecified: Secondary | ICD-10-CM | POA: Diagnosis not present

## 2016-04-27 MED ORDER — ONDANSETRON HCL 4 MG PO TABS: 4.0000 mg | ORAL_TABLET | Freq: Three times a day (TID) | ORAL | 0 refills | Status: DC | PRN

## 2016-04-27 NOTE — Patient Instructions (Signed)
Clear liquid diet. Bland diet.

## 2016-04-27 NOTE — Progress Notes (Signed)
Subjective:    Patient ID: Pamela [name], female    DOB: 02-18-1921, 82 y.o.   MRN: [id number]  HPI Referred by Alcide CleverJuinell Koberlein [facility]) for constipation/diarrhea. She tells me she had an upset stomach yesterday. No vomiting. She did have some vomiting. She tells me the diarrhea started 2 days ago. She was having 8-9 stools. She tells me today her stools are better. She has had one stool today.  She tells me usually her stools are normal. No rectal bleeding. There has been no fever.  She wonders if if she had a stomach virus. She says she feels 70% better. She has taken Pepto as needed.    Review of Systems Past Medical History:  Diagnosis Date  . Coronary artery disease    stent in the LAD artery in 2002  . HTN (hypertension) 08/30/2012  . Hyperlipidemia 08/30/2012  . ICD (implantable cardiac defibrillator) in place   . Macular degeneration   . Pacemaker 08/29/2012   st jude accent DR RF device, model number [id number], serial number [id number], implanted 08/29/2012 for sinus bradycardia, runs of supraventricular tachycardia  last checked 10/23/2012  . Polyneuropathy in other diseases classified elsewhere (HCC)   . SSS (sick sinus syndrome) with PAT and marked bradycardia with 3 sec pauses  08/30/2012  . Status post placement of cardiac pacemaker. 08/29/12 St. Jude device 08/30/2012  . Venous insufficiency     Past Surgical History:  Procedure Laterality Date  . ABDOMINAL HYSTERECTOMY    . CARDIAC CATHETERIZATION  2006   3 stents placed  . CARDIAC CATHETERIZATION  06/2001   placement of BiodivYsio 2.5.10mm stent dilated to 2.6275mm in proximal left anterior descenting stenotic lesion  . CESAREAN SECTION    . COLONOSCOPY W/ POLYPECTOMY    . HIP ARTHROPLASTY Left 01/04/2014   Procedure: ARTHROPLASTY BIPOLAR HIP;  Surgeon: Shelda PalMatthew D Olin, MD;  [facility];  Service: Orthopedics;  Laterality: Left;  . INSERT / REPLACE / REMOVE PACEMAKER  08/29/2012   st jude   . KIDNEY STONE  SURGERY    . OPEN REDUCTION INTERNAL FIXATION (ORIF) DISTAL RADIAL FRACTURE Right 04/01/2016   Procedure: OPEN REDUCTION INTERNAL FIXATION (ORIF) RIGHT DISTAL RADIAL FRACTURE AND REPAIR;  Surgeon: Bradly BienenstockFred Ortmann, MD;  Location: [facility] OR;  Service: Orthopedics;  Laterality: Right;  . PERMANENT PACEMAKER INSERTION N/A 08/29/2012   Procedure: PERMANENT PACEMAKER INSERTION;  Surgeon: Thurmon FairMihai Croitoru, MD;  Location: [facility] CATH LAB;  Service: Cardiovascular;  Laterality: N/A;  . RADIOFREQUENCY ABLATION  08/25/2010   ablation of left greater saphenous vein  . ROTATOR CUFF REPAIR Left   . ROTATOR CUFF REPAIR Right   . TOTAL HIP REVISION Left 07/24/2015   Procedure: ORIF LEFT PERIPROSTHETIC FEMUR FRACTURE; CONVERSION TO TOTAL HIP ARTHROPLASTY POSS. FEMORAL COMPONENT REVISION ;  Surgeon: Samson FredericBrian Swinteck, MD;  [facility];  Service: Orthopedics;  Laterality: Left;    Allergies  Allergen Reactions  . Gabapentin Other (See Comments)    Blurred vision, dizziness     Current Outpatient Prescriptions on File Prior to Visit  Medication Sig Dispense Refill  . acetaminophen (TYLENOL) 500 MG tablet Take 500 mg by mouth every 6 (six) hours as needed for mild pain or moderate pain.     Marland Kitchen. aspirin EC 81 MG tablet Take 81 mg by mouth every morning.     . carvedilol (COREG) 3.125 MG tablet Take 1 tablet (3.125 mg total) by mouth 2 (two) times daily. 180 tablet 3  . Cholecalciferol (VITAMIN D-3  PO) Take 1 capsule by mouth daily.    Marland Kitchen docusate sodium (COLACE) 100 MG capsule Take 1 capsule (100 mg total) by mouth 2 (two) times daily. 10 capsule 0  . folic acid (FOLVITE) 1 MG tablet Take 1 tablet (1 mg total) by mouth daily.    . furosemide (LASIX) 20 MG tablet TAKE ONE TABLET BY MOUTH DAILY AS NEEDEDFOR EDEMA 30 tablet 2  . HYDROcodone-acetaminophen (NORCO/VICODIN) 5-325 MG tablet Take 0.5 tablets by mouth every 4 (four) hours as needed for moderate pain.    Marland Kitchen l-methylfolate-B6-B12 (METANX) 3-35-2 MG TABS TAKE ONE TABLET  TWICE DAILY 60 tablet 3  . Multiple Vitamins-Minerals (OCUVITE PO) Take 1 tablet by mouth 2 (two) times daily.    . nitroGLYCERIN (NITROSTAT) 0.4 MG SL tablet Place 1 tablet (0.4 mg total) under the tongue every 5 (five) minutes as needed for chest pain. 100 tablet 3  . Polyethyl Glycol-Propyl Glycol (SYSTANE OP) Apply 1 drop to eye daily as needed (Dry Eyes).     . ramipril (ALTACE) 2.5 MG capsule Take 1 capsule (2.5 mg total) by mouth daily. 30 capsule 6  . vitamin B-12 (CYANOCOBALAMIN) 1000 MCG tablet Take 1,000 mcg by mouth daily.    . vitamin C (ASCORBIC ACID) 500 MG tablet Take 1 tablet (500 mg total) by mouth daily. 50 tablet 0   No current facility-administered medications on file prior to visit.        Objective:   Physical Exam Blood pressure (!) 146/70, pulse 64, temperature 97.6 F (36.4 C), height 5' 5.5" (1.664 m), weight 136 lb 11.2 oz (62 kg).  Alert and oriented. Skin warm and dry. Oral mucosa is moist.   . Sclera anicteric, conjunctivae is pink. Thyroid not enlarged. No cervical lymphadenopathy. Lungs clear. Heart regular rate and rhythm.  Abdomen is soft. Bowel sounds are positive. No hepatomegaly. No abdominal masses felt. No tenderness.  No edema to lower extremities.         Assessment & Plan:  Diarrhea, resolved. Feels 70% better.  CBC, CMET today. Clear liquid diet today and then bland diet and may advance

## 2016-04-28 DIAGNOSIS — S52571D Other intraarticular fracture of lower end of right radius, subsequent encounter for closed fracture with routine healing: Secondary | ICD-10-CM | POA: Diagnosis not present

## 2016-04-28 LAB — COMPREHENSIVE METABOLIC PANEL
ALBUMIN: 4.1 g/dL (ref 3.6–5.1)
ALT: 12 U/L (ref 6–29)
AST: 20 U/L (ref 10–35)
Alkaline Phosphatase: 57 U/L (ref 33–130)
BUN: 13 mg/dL (ref 7–25)
CALCIUM: 10.2 mg/dL (ref 8.6–10.4)
CHLORIDE: 102 mmol/L (ref 98–110)
CO2: 27 mmol/L (ref 20–31)
CREATININE: 0.82 mg/dL (ref 0.60–0.88)
Glucose, Bld: 96 mg/dL (ref 65–99)
POTASSIUM: 3.7 mmol/L (ref 3.5–5.3)
SODIUM: 141 mmol/L (ref 135–146)
Total Bilirubin: 0.5 mg/dL (ref 0.2–1.2)
Total Protein: 6.5 g/dL (ref 6.1–8.1)

## 2016-04-28 LAB — CBC WITH DIFFERENTIAL/PLATELET
BASOS ABS: 0 {cells}/uL (ref 0–200)
Basophils Relative: 0 %
EOS PCT: 3 %
Eosinophils Absolute: 141 cells/uL (ref 15–500)
HCT: 37.8 % (ref 35.0–45.0)
HEMOGLOBIN: 12 g/dL (ref 11.7–15.5)
LYMPHS ABS: 1457 {cells}/uL (ref 850–3900)
Lymphocytes Relative: 31 %
MCH: 29.5 pg (ref 27.0–33.0)
MCHC: 31.7 g/dL — AB (ref 32.0–36.0)
MCV: 92.9 fL (ref 80.0–100.0)
MPV: 10.3 fL (ref 7.5–12.5)
Monocytes Absolute: 282 cells/uL (ref 200–950)
Monocytes Relative: 6 %
NEUTROS PCT: 60 %
Neutro Abs: 2820 cells/uL (ref 1500–7800)
Platelets: 160 10*3/uL (ref 140–400)
RBC: 4.07 MIL/uL (ref 3.80–5.10)
RDW: 14.6 % (ref 11.0–15.0)
WBC: 4.7 10*3/uL (ref 3.8–10.8)

## 2016-05-09 DIAGNOSIS — B029 Zoster without complications: Secondary | ICD-10-CM | POA: Diagnosis not present

## 2016-05-12 ENCOUNTER — Ambulatory Visit (HOSPITAL_COMMUNITY): Payer: Medicare Other | Admitting: Specialist

## 2016-05-12 ENCOUNTER — Encounter (HOSPITAL_COMMUNITY): Payer: Self-pay

## 2016-05-16 ENCOUNTER — Telehealth (HOSPITAL_COMMUNITY): Payer: Self-pay

## 2016-05-16 NOTE — Telephone Encounter (Signed)
Patient reports she have the shingles and can not come this week and will call back on next week to make an appt.

## 2016-05-17 ENCOUNTER — Ambulatory Visit (HOSPITAL_COMMUNITY): Payer: Medicare Other

## 2016-05-22 ENCOUNTER — Encounter: Payer: Medicare Other | Admitting: Internal Medicine

## 2016-05-25 DIAGNOSIS — H353123 Nonexudative age-related macular degeneration, left eye, advanced atrophic without subfoveal involvement: Secondary | ICD-10-CM | POA: Diagnosis not present

## 2016-05-25 DIAGNOSIS — H353111 Nonexudative age-related macular degeneration, right eye, early dry stage: Secondary | ICD-10-CM | POA: Diagnosis not present

## 2016-05-26 DIAGNOSIS — H353113 Nonexudative age-related macular degeneration, right eye, advanced atrophic without subfoveal involvement: Secondary | ICD-10-CM | POA: Diagnosis not present

## 2016-05-30 DIAGNOSIS — S52571D Other intraarticular fracture of lower end of right radius, subsequent encounter for closed fracture with routine healing: Secondary | ICD-10-CM | POA: Diagnosis not present

## 2016-06-27 ENCOUNTER — Ambulatory Visit (HOSPITAL_COMMUNITY): Payer: Medicare Other | Admitting: Physical Therapy

## 2016-06-28 DIAGNOSIS — H348392 Tributary (branch) retinal vein occlusion, unspecified eye, stable: Secondary | ICD-10-CM | POA: Diagnosis not present

## 2016-06-30 ENCOUNTER — Other Ambulatory Visit: Payer: Self-pay

## 2016-06-30 MED ORDER — RAMIPRIL 2.5 MG PO CAPS: 2.5000 mg | ORAL_CAPSULE | Freq: Every day | ORAL | 6 refills | Status: DC

## 2016-06-30 NOTE — Telephone Encounter (Signed)
Ramipril refilled. 

## 2016-07-03 ENCOUNTER — Encounter (HOSPITAL_COMMUNITY): Payer: Self-pay

## 2016-07-03 ENCOUNTER — Ambulatory Visit (HOSPITAL_COMMUNITY): Payer: Medicare Other | Attending: Orthopedic Surgery

## 2016-07-03 DIAGNOSIS — R29898 Other symptoms and signs involving the musculoskeletal system: Secondary | ICD-10-CM | POA: Insufficient documentation

## 2016-07-03 NOTE — Therapy (Signed)
[facility] [address] [place], [place], [address] Phone: [phone]   Fax:  [phone]  Occupational Therapy Evaluation  Patient Details  Name: Pamela [name] MRN: [id number] Date of Birth: [birth year] Referring Provider: Bradly Bienenstock, MD  Encounter Date: 07/03/2016      OT End of Session - 07/03/16 1832    Visit Number 1   Number of Visits 1   Authorization Type BCBS Medicare   Authorization Time Period Before 10th visit.   Authorization - Visit Number 1   Authorization - Number of Visits 10   OT Start Time 1615   OT Stop Time 1653   OT Time Calculation (min) 38 min   Activity Tolerance Patient tolerated treatment well   Behavior During Therapy WFL for tasks assessed/performed      Past Medical History:  Diagnosis Date  . Coronary artery disease    stent in the LAD artery in 2002  . HTN (hypertension) 08/30/2012  . Hyperlipidemia 08/30/2012  . ICD (implantable cardiac defibrillator) in place   . Macular degeneration   . Pacemaker 08/29/2012   st jude accent DR RF device, model number [id number], serial number [id number], implanted 08/29/2012 for sinus bradycardia, runs of supraventricular tachycardia  last checked 10/23/2012  . Polyneuropathy in other diseases classified elsewhere (HCC)   . SSS (sick sinus syndrome) with PAT and marked bradycardia with 3 sec pauses  08/30/2012  . Status post placement of cardiac pacemaker. 08/29/12 St. Jude device 08/30/2012  . Venous insufficiency     Past Surgical History:  Procedure Laterality Date  . ABDOMINAL HYSTERECTOMY    . CARDIAC CATHETERIZATION  2006   3 stents placed  . CARDIAC CATHETERIZATION  06/2001   placement of BiodivYsio 2.5.10mm stent dilated to 2.76mm in proximal left anterior descenting stenotic lesion  . CESAREAN SECTION    . COLONOSCOPY W/ POLYPECTOMY    . HIP ARTHROPLASTY Left 01/04/2014   Procedure: ARTHROPLASTY BIPOLAR HIP;  Surgeon: Shelda Pal, MD;  Location: [facility]  OR;  Service: Orthopedics;  Laterality: Left;  . INSERT / REPLACE / REMOVE PACEMAKER  08/29/2012   st jude   . KIDNEY STONE SURGERY    . OPEN REDUCTION INTERNAL FIXATION (ORIF) DISTAL RADIAL FRACTURE Right 04/01/2016   Procedure: OPEN REDUCTION INTERNAL FIXATION (ORIF) RIGHT DISTAL RADIAL FRACTURE AND REPAIR;  Surgeon: Bradly Bienenstock, MD;  Location: [facility] OR;  Service: Orthopedics;  Laterality: Right;  . PERMANENT PACEMAKER INSERTION N/A 08/29/2012   Procedure: PERMANENT PACEMAKER INSERTION;  Surgeon: Thurmon Fair, MD;  Location: [facility] CATH LAB;  Service: Cardiovascular;  Laterality: N/A;  . RADIOFREQUENCY ABLATION  08/25/2010   ablation of left greater saphenous vein  . ROTATOR CUFF REPAIR Left   . ROTATOR CUFF REPAIR Right   . TOTAL HIP REVISION Left 07/24/2015   Procedure: ORIF LEFT PERIPROSTHETIC FEMUR FRACTURE; CONVERSION TO TOTAL HIP ARTHROPLASTY POSS. FEMORAL COMPONENT REVISION ;  Surgeon: Samson Frederic, MD;  Location: [facility] OR;  Service: Orthopedics;  Laterality: Left;    There were no vitals filed for this visit.      Subjective Assessment - 07/03/16 1828    Subjective  S: I don't have to do much for myself because I have a helper.   Patient is accompained by: Family member  Daughter in law   Pertinent History Patient is a 82 y/o female S/P right distal radius fracture which occurred after a mechanical fall. Patient underwent surgery on 04/01/16. Patient reports no pain during daily tasks.  Dr. Melvyn Novasrtmann has referred patient to occupational therapy for evaluation and treatment.   Special Tests FOTO score: 51/100    Patient Stated Goals To increase strength.   Currently in Pain? No/denies           [facility] OT Assessment - 07/03/16 1620      Assessment   Diagnosis right distal radius fracture   Referring Provider Bradly BienenstockFred Ortmann, MD   Onset Date 04/01/16  surgery   Assessment 07/04/16 - follow up   Prior Therapy None     Precautions   Precautions Fall   Precaution Comments Pacemaker,  AMD more left than right     Restrictions   Weight Bearing Restrictions No     Balance Screen   Has the patient fallen in the past 6 months No     Home  Environment   Family/patient expects to be discharged to: Private residence   Living Arrangements Alone   Additional Comments Pt has a helper that assists with bathing, dressing, and meal prep.     Prior Function   Level of Independence Requires assistive device for independence;Needs assistance with homemaking   Vocation Retired     ADL   ADL comments Difficulty with strength related to grip and pinch.     Mobility   Mobility Status History of falls     Written Expression   Dominant Hand Right     Vision - History   Baseline Vision Wears contact   Visual History Macular degeneration     Cognition   Overall Cognitive Status Within Functional Limits for tasks assessed     Coordination   9 Hole Peg Test Left;Right   Right 9 Hole Peg Test 30.1"   Left 9 Hole Peg Test 41.1"     Edema   Edema None noted     ROM / Strength   AROM / PROM / Strength AROM;PROM;Strength     AROM   Overall AROM Comments Assessed seated.    AROM Assessment Site Wrist   Right/Left Wrist Right   Right Wrist Extension 56 Degrees   Right Wrist Flexion 70 Degrees   Right Wrist Radial Deviation 20 Degrees   Right Wrist Ulnar Deviation 30 Degrees     Strength   Overall Strength Comments Assessed seated.   Strength Assessment Site Hand;Wrist   Right/Left Wrist Right   Right Wrist Flexion 4+/5   Right Wrist Extension 5/5   Right Wrist Radial Deviation 5/5   Right Wrist Ulnar Deviation 5/5   Right/Left hand Right;Left   Right Hand Grip (lbs) 30   Right Hand Lateral Pinch 10 lbs   Right Hand 3 Point Pinch 6 lbs   Left Hand Grip (lbs) 30   Left Hand Lateral Pinch 10 lbs   Left Hand 3 Point Pinch 10 lbs                         OT Education - 07/03/16 1832    Education provided Yes   Education Details wrist  strengthening with 2# hand weight and red theraputty exercises   Person(s) Educated Patient;Other (comment)  Daughter-in-law   Methods Explanation;Demonstration;Verbal cues;Handout   Comprehension Verbalized understanding          OT Short Term Goals - 07/03/16 1836      OT SHORT TERM GOAL #1   Title Patient will be educated and independent with HEP to increase functional strength of Right wrist needed to complete daily tasks.  Time 1   Period Days   Status Achieved                  Plan - 07/03/16 1834    Clinical Impression Statement A: Pt is a 82 y/o female S/P right distal radius fracture post surgical causing decreased strength and minor difficulty completing daily tasks in which grip and pinch is required. patient reports that she feels comfortable completing and HEP independently at home. HEP was reviewed and patient returned demo with verbal cueing.    Rehab Potential Excellent   OT Frequency One time visit   OT Treatment/Interventions Patient/family education   Plan P: 1 time eval with HEP.   OT Home Exercise Plan 12/4: Red theraputty and wrist strengthening exercises   Consulted and Agree with Plan of Care Patient;Family member/caregiver   Family Member Consulted Daughter-in-law      Patient will benefit from skilled therapeutic intervention in order to improve the following deficits and impairments:  Decreased strength  Visit Diagnosis: Other symptoms and signs involving the musculoskeletal system - Plan: Ot plan of care cert/re-cert      G-Codes - 07/03/16 1837    Functional Assessment Tool Used FOTO score: 51/100 (49% impaired)   Functional Limitation Carrying, moving and handling objects   Carrying, Moving and Handling Objects Current Status (J8119(G8984) At least 40 percent but less than 60 percent impaired, limited or restricted   Carrying, Moving and Handling Objects Goal Status (J4782(G8985) At least 40 percent but less than 60 percent impaired, limited or  restricted   Carrying, Moving and Handling Objects Discharge Status [phone](G8986) At least 40 percent but less than 60 percent impaired, limited or restricted      Problem List Patient Active Problem List   Diagnosis Date Noted  . Urinary frequency 08/10/2015  . Postoperative anemia due to acute blood loss 07/27/2015  . SVT (supraventricular tachycardia) (HCC) 07/26/2015  . Femur fracture, left (HCC) 07/24/2015  . Chronic kidney disease 07/24/2015  . Periprosthetic fracture around internal prosthetic left hip joint (HCC) 07/24/2015  . Fall at home   . DVT (deep venous thrombosis) (HCC) 01/30/2014  . Occult blood positive stool 01/30/2014  . Thrombocytopenia, unspecified (HCC) 01/06/2014  . Anemia, iron deficiency 01/06/2014  . Hip fracture, left (HCC) 01/03/2014  . QT prolongation 01/03/2014  . Atherosclerotic cardiovascular disease 08/13/2013  . Hypertensive emergency 04/08/2013  . Dizzy 04/08/2013  . PAT (paroxysmal atrial tachycardia) (HCC) 12/25/2012  . SSS (sick sinus syndrome) with PAT and marked bradycardia with 3 sec pauses  08/30/2012  . Status post placement of cardiac pacemaker. 08/29/12 St. Jude device 08/30/2012  . HTN (hypertension) 08/30/2012  . Hyperlipidemia 08/30/2012  . Osteoporosis, unspecified 04/19/2012  . Essential and other specified forms of tremor 04/19/2012  . Essential hypertension 04/19/2012  . Cardiac dysrhythmia, unspecified 04/19/2012  . Chronic ischemic heart disease 04/19/2012  . Hereditary and idiopathic peripheral neuropathy 04/19/2012  . Memory loss 04/19/2012  . Disturbance of skin sensation 04/19/2012  . Pain in joint, shoulder region 11/20/2011  . Status post complete repair of rotator cuff 11/20/2011  . Muscle weakness (generalized) 11/20/2011   Limmie PatriciaLaura Elek Holderness, OTR/L,CBIS  [phone]  07/03/2016, 6:39 PM  [facility] [address] [place], [place], [address] Phone: [phone]   Fax:   [phone]  Name: Rosanne Sackreva N Guardiola MRN: [id number] Date of Birth: August 24, 1920

## 2016-07-03 NOTE — Patient Instructions (Addendum)
   Complete 10-12 reps of the following exercises. Hold a 1-2 lb hand weight. Complete 1-2 times a day.  WRIST FLEXION CURLS - TABLE  Hold a small free weight, rest your forearm on a table and bend your wrist up and down with your palm face up as shown.      WRIST EXTENSION CURLS - TABLE  Hold a small free weight, rest your forearm on a table and bend your wrist up and down with your palm face down as shown.      FREE WEIGHT RADIAL DEVIATION - TABLE  Hold a small free weight, rest your forearm on a table and bend your wrist up and down with your palm facing towards the side as shown.     Home Exercises Program Theraputty Exercises  Do the following exercises 1-2 times a day using your affected hand.  1. Roll putty into a ball.  2. Make into a pancake.  3. Roll putty into a roll.  4. Pinch along log with first finger and thumb.   5. Make into a ball.  6. Roll it back into a log.   7. Pinch using thumb and side of first finger.  8. Roll into a ball, then flatten into a pancake.  9. Roll back into a log and grip putty. Start at one end and work your way down.  10. Using your fingers, make putty into a mountain.

## 2016-07-04 ENCOUNTER — Ambulatory Visit (INDEPENDENT_AMBULATORY_CARE_PROVIDER_SITE_OTHER): Payer: Medicare Other | Admitting: Adult Health

## 2016-07-04 ENCOUNTER — Encounter: Payer: Self-pay | Admitting: Adult Health

## 2016-07-04 VITALS — BP 150/72 | HR 68 | Ht 65.5 in | Wt 139.6 lb

## 2016-07-04 DIAGNOSIS — R269 Unspecified abnormalities of gait and mobility: Secondary | ICD-10-CM

## 2016-07-04 DIAGNOSIS — G609 Hereditary and idiopathic neuropathy, unspecified: Secondary | ICD-10-CM

## 2016-07-04 DIAGNOSIS — M9702XD Periprosthetic fracture around internal prosthetic left hip joint, subsequent encounter: Secondary | ICD-10-CM | POA: Diagnosis not present

## 2016-07-04 DIAGNOSIS — S52571D Other intraarticular fracture of lower end of right radius, subsequent encounter for closed fracture with routine healing: Secondary | ICD-10-CM | POA: Diagnosis not present

## 2016-07-04 DIAGNOSIS — Z4789 Encounter for other orthopedic aftercare: Secondary | ICD-10-CM | POA: Diagnosis not present

## 2016-07-04 NOTE — Patient Instructions (Signed)
If your symptoms worsen or you develop new symptoms please let us know.   

## 2016-07-04 NOTE — Progress Notes (Addendum)
PATIENT: Pamela [name] DOB: [birth year]  REASON FOR VISIT: follow up- peripheral neuropathy HISTORY FROM: patient  HISTORY OF PRESENT ILLNESS: Pamela [name] is a 82 year old female with a history of peripheral neuropathy with gait disturbance. She returns today for follow-up. She states that she has numbness and tingling primarily in the feet. In the past she has been on gabapentin but it caused blurry vision so she stopped this medication. She also tried compounded cream but reports that it was expensive and did not work very well. She states right now her pain is tolerable. If she begins have sharp shooting pain she can typically use a heating pad for relief. She states that she did have a fall back in September and broke her right wrist. She has since had surgery and is doing well. She uses a cane when ambulating. Denies any additional falls. She does have sitters that help her with ADLs. She does not operate a motor vehicle. She returns today for an evaluation.  HISTORY 12/22/15:Pamela [name] is a 82 year old female with a history of peripheral neuropathy with gait disturbance. She returns today for follow-up. She does report numbness in the feet. She does take gabapentin 100 mg at bedtime. She reports that this was working well however she has been having burning and painful sensations in the feet in the morning when she is still in bed. She states that she puts a heating pad on her feet it helps the discomfort. She uses a cane or walker when ambulating. She has not had any additional falls. She returns today for an evaluation.  HISTORY 03/24/15 (WILLIS): Pamela [name] is a 82 year old right-handed white female with a history of a peripheral neuropathy associated with a gait disturbance. The patient fell in July 2016, and fractured the transverse processes on the L1 and L2 vertebra. The patient is still having some discomfort following this fall. She ambulates with a cane, but she fell in the home  environment, and she was not using a cane at that time. She has had 2 falls since last seen. She has some discomfort in the left hip following a partial hip replacement, she will get injections into the joint on occasion. The patient returns to this office for further evaluation. She does have some numbness in the feet, she denies any significant discomfort in the feet, however. She is sleeping well, she takes very low-dose gabapentin, 100 mg at night  REVIEW OF SYSTEMS: Out of a complete 14 system review of symptoms, the patient complains only of the following symptoms, and all other reviewed systems are negative.  See history of present illness  ALLERGIES: Allergies  Allergen Reactions  . Gabapentin Other (See Comments)    Blurred vision, dizziness     HOME MEDICATIONS: Outpatient Medications Prior to Visit  Medication Sig Dispense Refill  . acetaminophen (TYLENOL) 500 MG tablet Take 500 mg by mouth every 6 (six) hours as needed for mild pain or moderate pain.     Marland Kitchen aspirin EC 81 MG tablet Take 81 mg by mouth every morning.     . carvedilol (COREG) 3.125 MG tablet Take 1 tablet (3.125 mg total) by mouth 2 (two) times daily. 180 tablet 3  . Cholecalciferol (VITAMIN D-3 PO) Take 1 capsule by mouth daily.    Marland Kitchen docusate sodium (COLACE) 100 MG capsule Take 1 capsule (100 mg total) by mouth 2 (two) times daily. 10 capsule 0  . folic acid (FOLVITE) 1 MG tablet Take 1 tablet (1 mg  total) by mouth daily.    . furosemide (LASIX) 20 MG tablet TAKE ONE TABLET BY MOUTH DAILY AS NEEDEDFOR EDEMA 30 tablet 2  . l-methylfolate-B6-B12 (METANX) 3-35-2 MG TABS TAKE ONE TABLET TWICE DAILY 60 tablet 3  . Multiple Vitamins-Minerals (OCUVITE PO) Take 1 tablet by mouth 2 (two) times daily.    . nitroGLYCERIN (NITROSTAT) 0.4 MG SL tablet Place 1 tablet (0.4 mg total) under the tongue every 5 (five) minutes as needed for chest pain. 100 tablet 3  . Polyethyl Glycol-Propyl Glycol (SYSTANE OP) Apply 1 drop to eye  daily as needed (Dry Eyes).     . ramipril (ALTACE) 2.5 MG capsule Take 1 capsule (2.5 mg total) by mouth daily. 30 capsule 6  . vitamin B-12 (CYANOCOBALAMIN) 1000 MCG tablet Take 1,000 mcg by mouth daily.    . vitamin C (ASCORBIC ACID) 500 MG tablet Take 1 tablet (500 mg total) by mouth daily. 50 tablet 0  . HYDROcodone-acetaminophen (NORCO/VICODIN) 5-325 MG tablet Take 0.5 tablets by mouth every 4 (four) hours as needed for moderate pain.    Marland Kitchen. ondansetron (ZOFRAN) 4 MG tablet Take 1 tablet (4 mg total) by mouth every 8 (eight) hours as needed for nausea or vomiting. (Patient not taking: Reported on 07/04/2016) 30 tablet 0   No facility-administered medications prior to visit.     PAST MEDICAL HISTORY: Past Medical History:  Diagnosis Date  . Coronary artery disease    stent in the LAD artery in 2002  . HTN (hypertension) 08/30/2012  . Hyperlipidemia 08/30/2012  . ICD (implantable cardiac defibrillator) in place   . Macular degeneration   . Pacemaker 08/29/2012   st jude accent DR RF device, model number [id number], serial number [id number], implanted 08/29/2012 for sinus bradycardia, runs of supraventricular tachycardia  last checked 10/23/2012  . Polyneuropathy in other diseases classified elsewhere (HCC)   . SSS (sick sinus syndrome) with PAT and marked bradycardia with 3 sec pauses  08/30/2012  . Status post placement of cardiac pacemaker. 08/29/12 St. Jude device 08/30/2012  . Venous insufficiency     PAST SURGICAL HISTORY: Past Surgical History:  Procedure Laterality Date  . ABDOMINAL HYSTERECTOMY    . CARDIAC CATHETERIZATION  2006   3 stents placed  . CARDIAC CATHETERIZATION  06/2001   placement of BiodivYsio 2.5.10mm stent dilated to 2.4075mm in proximal left anterior descenting stenotic lesion  . CESAREAN SECTION    . COLONOSCOPY W/ POLYPECTOMY    . HIP ARTHROPLASTY Left 01/04/2014   Procedure: ARTHROPLASTY BIPOLAR HIP;  Surgeon: Shelda PalMatthew D Olin, MD;  [facility];  Service:  Orthopedics;  Laterality: Left;  . INSERT / REPLACE / REMOVE PACEMAKER  08/29/2012   st jude   . KIDNEY STONE SURGERY    . OPEN REDUCTION INTERNAL FIXATION (ORIF) DISTAL RADIAL FRACTURE Right 04/01/2016   Procedure: OPEN REDUCTION INTERNAL FIXATION (ORIF) RIGHT DISTAL RADIAL FRACTURE AND REPAIR;  Surgeon: Bradly BienenstockFred Ortmann, MD;  Location: [facility] OR;  Service: Orthopedics;  Laterality: Right;  . PERMANENT PACEMAKER INSERTION N/A 08/29/2012   Procedure: PERMANENT PACEMAKER INSERTION;  Surgeon: Thurmon FairMihai Croitoru, MD;  Location: [facility] CATH LAB;  Service: Cardiovascular;  Laterality: N/A;  . RADIOFREQUENCY ABLATION  08/25/2010   ablation of left greater saphenous vein  . ROTATOR CUFF REPAIR Left   . ROTATOR CUFF REPAIR Right   . TOTAL HIP REVISION Left 07/24/2015   Procedure: ORIF LEFT PERIPROSTHETIC FEMUR FRACTURE; CONVERSION TO TOTAL HIP ARTHROPLASTY POSS. FEMORAL COMPONENT REVISION ;  Surgeon: Samson FredericBrian Swinteck, MD;  Location: [facility] OR;  Service: Orthopedics;  Laterality: Left;    FAMILY HISTORY: Family History  Problem Relation Age of Onset  . Heart disease Mother   . Congestive Heart Failure Mother   . Heart disease Father   . Heart attack Father   . Heart disease Brother     SOCIAL HISTORY: Social History   Social History  . Marital status: Widowed    Spouse name: N/A  . Number of children: 2  . Years of education: N/A   Occupational History  .  Retired   Social History Main Topics  . Smoking status: Never Smoker  . Smokeless tobacco: Never Used  . Alcohol use No  . Drug use: No  . Sexual activity: Not on file   Other Topics Concern  . Not on file   Social History Narrative   Patient drinks about 2 cups of caffeine daily.   Patient is right handed.      PHYSICAL EXAM  Vitals:   07/04/16 1334  BP: (!) 150/72  Pulse: 68  Weight: 139 lb 9.6 oz (63.3 kg)  Height: 5' 5.5" (1.664 m)   Body mass index is 22.88 kg/m.  Generalized: Well developed, in no acute distress    Neurological examination  Mentation: Alert oriented to time, place, history taking. Follows all commands speech and language fluent Cranial nerve II-XII: Pupils were equal round reactive to light. Extraocular movements were full, visual field were full on confrontational test. Facial sensation and strength were normal. Uvula tongue midline. Head turning and shoulder shrug  were normal and symmetric. Motor: The motor testing reveals 5 over 5 strength of all 4 extremities. Good symmetric motor tone is noted throughout.  Sensory: Sensory testing is intact to soft touch on all 4 extremities. No evidence of extinction is noted.  Coordination: Cerebellar testing reveals good finger-nose-finger and heel-to-shin bilaterally.  Gait and station: Gait is normal. Tandem gait is normal. Romberg is negative. No drift is seen.  Reflexes: Deep tendon reflexes are symmetric and normal bilaterally.   DIAGNOSTIC DATA (LABS, IMAGING, TESTING) - I reviewed patient records, labs, notes, testing and imaging myself where available.  Lab Results  Component Value Date   WBC 4.7 04/27/2016   HGB 12.0 04/27/2016   HCT 37.8 04/27/2016   MCV 92.9 04/27/2016   PLT 160 04/27/2016      Component Value Date/Time   NA 141 04/27/2016 1452   K 3.7 04/27/2016 1452   CL 102 04/27/2016 1452   CO2 27 04/27/2016 1452   GLUCOSE 96 04/27/2016 1452   BUN 13 04/27/2016 1452   CREATININE 0.82 04/27/2016 1452   CALCIUM 10.2 04/27/2016 1452   PROT 6.5 04/27/2016 1452   ALBUMIN 4.1 04/27/2016 1452   AST 20 04/27/2016 1452   ALT 12 04/27/2016 1452   ALKPHOS 57 04/27/2016 1452   BILITOT 0.5 04/27/2016 1452   GFRNONAA >60 04/01/2016 0706   GFRAA >60 04/01/2016 0706   Lab Results  Component Value Date   CHOL 158 04/09/2013   HDL 63 04/09/2013   LDLCALC 75 04/09/2013   TRIG 98 04/09/2013   CHOLHDL 2.5 04/09/2013   No results found for: HGBA1C No results found for: VITAMINB12 Lab Results  Component Value Date   TSH  1.501 08/13/2013      ASSESSMENT AND PLAN 82 y.o. year old female  has a past medical history of Coronary artery disease; HTN (hypertension) (08/30/2012); Hyperlipidemia (08/30/2012); ICD (implantable cardiac defibrillator) in place; Macular degeneration; Pacemaker (08/29/2012);  Polyneuropathy in other diseases classified elsewhere [facility]); SSS (sick sinus syndrome) with PAT and marked bradycardia with 3 sec pauses  (08/30/2012); Status post placement of cardiac pacemaker. 08/29/12 St. Jude device (08/30/2012); and Venous insufficiency. here with:  1. Peripheral neuropathy 2. Abnormality of gait  The patient is currently not using any medication for her neuropathy discomfort. She reports that her pain is tolerable she is able to use a heating pad with good relief. For now we will not initiate any new medication. Patient advised that if her pain becomes intolerable she should let us know. Will refer to PT for gait and balance training. She will follow-up in 7-8 months with Dr. Anne Hahn.    Butch Penny, MSN, NP-C 07/04/2016, 1:50 PM [facility] [address] [place], [place] [address] [phone]

## 2016-07-04 NOTE — Progress Notes (Signed)
I have read the note, and I agree with the clinical assessment and plan.  Pamela [name]   

## 2016-07-04 NOTE — Addendum Note (Signed)
Addended by: Enedina FinnerMILLIKAN, Stan Cantave P on: 07/04/2016 03:27 PM   Modules accepted: Orders

## 2016-07-06 ENCOUNTER — Ambulatory Visit (INDEPENDENT_AMBULATORY_CARE_PROVIDER_SITE_OTHER): Payer: Medicare Other | Admitting: Internal Medicine

## 2016-07-06 ENCOUNTER — Encounter: Payer: Self-pay | Admitting: Internal Medicine

## 2016-07-06 VITALS — BP 174/80 | HR 81 | Ht 66.0 in | Wt 136.0 lb

## 2016-07-06 DIAGNOSIS — Z950 Presence of cardiac pacemaker: Secondary | ICD-10-CM | POA: Diagnosis not present

## 2016-07-06 DIAGNOSIS — I495 Sick sinus syndrome: Secondary | ICD-10-CM | POA: Diagnosis not present

## 2016-07-06 LAB — CUP PACEART INCLINIC DEVICE CHECK
Battery Voltage: 2.99 V
Brady Statistic RA Percent Paced: 9.5 %
Date Time Interrogation Session: 20171207091639
Implantable Lead Implant Date: 20140414
Implantable Lead Location: 753859
Lead Channel Impedance Value: 375 Ohm
Lead Channel Pacing Threshold Amplitude: 0.375 V
Lead Channel Pacing Threshold Pulse Width: 0.4 ms
Lead Channel Setting Pacing Amplitude: 0.875
MDC IDC LEAD IMPLANT DT: 20140414
MDC IDC LEAD LOCATION: 753860
MDC IDC MSMT LEADCHNL RA SENSING INTR AMPL: 3.1 mV
MDC IDC MSMT LEADCHNL RV IMPEDANCE VALUE: 437.5 Ohm
MDC IDC MSMT LEADCHNL RV PACING THRESHOLD AMPLITUDE: 0.625 V
MDC IDC MSMT LEADCHNL RV PACING THRESHOLD PULSEWIDTH: 0.4 ms
MDC IDC MSMT LEADCHNL RV SENSING INTR AMPL: 12 mV
MDC IDC PG IMPLANT DT: 20140414
MDC IDC SET LEADCHNL RA PACING AMPLITUDE: 1.375
MDC IDC SET LEADCHNL RV PACING PULSEWIDTH: 0.4 ms
MDC IDC SET LEADCHNL RV SENSING SENSITIVITY: 2 mV
MDC IDC STAT BRADY RV PERCENT PACED: 0.1 %
Pulse Gen Model: 2210
Pulse Gen Serial Number: 7437302

## 2016-07-06 MED ORDER — CARVEDILOL 6.25 MG PO TABS: 6.2500 mg | ORAL_TABLET | Freq: Two times a day (BID) | ORAL | 3 refills | Status: DC

## 2016-07-06 NOTE — Progress Notes (Signed)
HPI [name] returns today for followup. She is a pleasant 82 yo woman with symptomatic tachybrady syndrome and pauses s/p PPM insertion. She has fallen twice since I last saw her and had her hip replaced with a rod down the femor. She has also fallen and fractured her wrist and had to have surgery. Overall she feels well however. Her blood pressure has been running on the high side. Allergies  Allergen Reactions  . Gabapentin Other (See Comments)    Blurred vision, dizziness      Current Outpatient Prescriptions  Medication Sig Dispense Refill  . acetaminophen (TYLENOL) 500 MG tablet Take 500 mg by mouth every 6 (six) hours as needed for mild pain or moderate pain.     Marland Kitchen. aspirin EC 81 MG tablet Take 81 mg by mouth every morning.     . carvedilol (COREG) 3.125 MG tablet Take 1 tablet (3.125 mg total) by mouth 2 (two) times daily. 180 tablet 3  . Cholecalciferol (VITAMIN D-3 PO) Take 1 capsule by mouth daily.    Marland Kitchen. docusate sodium (COLACE) 100 MG capsule Take 1 capsule (100 mg total) by mouth 2 (two) times daily. 10 capsule 0  . furosemide (LASIX) 20 MG tablet TAKE ONE TABLET BY MOUTH DAILY AS NEEDEDFOR EDEMA 30 tablet 2  . Multiple Vitamins-Minerals (OCUVITE PO) Take 1 tablet by mouth 2 (two) times daily.    . nitroGLYCERIN (NITROSTAT) 0.4 MG SL tablet Place 1 tablet (0.4 mg total) under the tongue every 5 (five) minutes as needed for chest pain. 100 tablet 3  . Polyethyl Glycol-Propyl Glycol (SYSTANE OP) Apply 1 drop to eye daily as needed (Dry Eyes).     . ramipril (ALTACE) 2.5 MG capsule Take 1 capsule (2.5 mg total) by mouth daily. 30 capsule 6  . vitamin B-12 (CYANOCOBALAMIN) 1000 MCG tablet Take 1,000 mcg by mouth daily.    . vitamin C (ASCORBIC ACID) 500 MG tablet Take 1 tablet (500 mg total) by mouth daily. 50 tablet 0   No current facility-administered medications for this visit.      Past Medical History:  Diagnosis Date  . Coronary artery disease    stent in  the LAD artery in 2002  . HTN (hypertension) 08/30/2012  . Hyperlipidemia 08/30/2012  . ICD (implantable cardiac defibrillator) in place   . Macular degeneration   . Pacemaker 08/29/2012   st jude accent DR RF device, model number [id number], serial number [id number], implanted 08/29/2012 for sinus bradycardia, runs of supraventricular tachycardia  last checked 10/23/2012  . Polyneuropathy in other diseases classified elsewhere (HCC)   . SSS (sick sinus syndrome) with PAT and marked bradycardia with 3 sec pauses  08/30/2012  . Status post placement of cardiac pacemaker. 08/29/12 St. Jude device 08/30/2012  . Venous insufficiency     ROS:   All systems reviewed and negative except as noted in the HPI.   Past Surgical History:  Procedure Laterality Date  . ABDOMINAL HYSTERECTOMY    . CARDIAC CATHETERIZATION  2006   3 stents placed  . CARDIAC CATHETERIZATION  06/2001   placement of BiodivYsio 2.5.10mm stent dilated to 2.5775mm in proximal left anterior descenting stenotic lesion  . CESAREAN SECTION    . COLONOSCOPY W/ POLYPECTOMY    . HIP ARTHROPLASTY Left 01/04/2014   Procedure: ARTHROPLASTY BIPOLAR HIP;  Surgeon: Pamela PalMatthew D Olin, MD;  [facility];  Service: Orthopedics;  Laterality: Left;  . INSERT / REPLACE / REMOVE PACEMAKER  08/29/2012  st jude   . KIDNEY STONE SURGERY    . OPEN REDUCTION INTERNAL FIXATION (ORIF) DISTAL RADIAL FRACTURE Right 04/01/2016   Procedure: OPEN REDUCTION INTERNAL FIXATION (ORIF) RIGHT DISTAL RADIAL FRACTURE AND REPAIR;  Surgeon: Pamela BienenstockFred Ortmann, MD;  Location: [facility] OR;  Service: Orthopedics;  Laterality: Right;  . PERMANENT PACEMAKER INSERTION N/A 08/29/2012   Procedure: PERMANENT PACEMAKER INSERTION;  Surgeon: Pamela FairMihai Croitoru, MD;  Location: [facility] CATH LAB;  Service: Cardiovascular;  Laterality: N/A;  . RADIOFREQUENCY ABLATION  08/25/2010   ablation of left greater saphenous vein  . ROTATOR CUFF REPAIR Left   . ROTATOR CUFF REPAIR Right   . TOTAL HIP REVISION Left  07/24/2015   Procedure: ORIF LEFT PERIPROSTHETIC FEMUR FRACTURE; CONVERSION TO TOTAL HIP ARTHROPLASTY POSS. FEMORAL COMPONENT REVISION ;  Surgeon: Pamela FredericBrian Swinteck, MD;  [facility];  Service: Orthopedics;  Laterality: Left;     Family History  Problem Relation Age of Onset  . Heart disease Mother   . Congestive Heart Failure Mother   . Heart disease Father   . Heart attack Father   . Heart disease Brother      Social History   Social History  . Marital status: Widowed    Spouse name: N/A  . Number of children: 2  . Years of education: N/A   Occupational History  .  Retired   Social History Main Topics  . Smoking status: Never Smoker  . Smokeless tobacco: Never Used  . Alcohol use No  . Drug use: No  . Sexual activity: Not on file   Other Topics Concern  . Not on file   Social History Narrative   Patient drinks about 2 cups of caffeine daily.   Patient is right handed.     BP (!) 174/80   Pulse 81   Ht 5\' 6"  (1.676 m)   Wt 136 lb (61.7 kg)   SpO2 97%   BMI 21.95 kg/m   Physical Exam:  Well appearing elderly woman, NAD HEENT: Unremarkable Neck:  No JVD, no thyromegally Back:  No CVA tenderness Lungs:  Clear with no wheezes HEART:  Regular rate rhythm, no murmurs, no rubs, no clicks Abd:  soft, positive bowel sounds, no organomegally, no rebound, no guarding Ext:  2 plus pulses, trace peripheral edema in the left leg, no cyanosis, no clubbing, right wrist with a well healed incision and some residual swelling. Skin:  No rashes no nodules Neuro:  CN II through XII intact, motor grossly intact  DEVICE  Normal device function.  See PaceArt for details.   Assess/Plan: 1. HTN heart disease - her systolic pressure is up and I have asked her to increase the coreg to 6.25 mg twice daily. 2. Sinus node dysfunction - she is stable s/p PM 3. PPM - her St. Jude DDD PM is working normally.  4. PAF - she is occaisionally out of rhythm. With her falls, her risk  of bleeding is increased and she would not be a good candidate for systemic anti-coag. She will continue her low dose ASA.  Leonia ReevesGregg Savannha Welle,M.D.

## 2016-07-06 NOTE — Patient Instructions (Addendum)
Your physician wants you to follow-up in: 1 year Dr Court Joyaylor You will receive a reminder letter in the mail two months in advance. If you don't receive a letter, please call our office to schedule the follow-up appointment.     INCREASE Coreg to 6.25g twice a day    Remote monitoring is used to monitor your Pacemaker of ICD from home. This monitoring reduces the number of office visits required to check your device to one time per year. It allows us to keep an eye on the functioning of your device to ensure it is working properly. You are scheduled for a device check from home on 10/05/16. You may send your transmission at any time that day. If you have a wireless device, the transmission will be sent automatically. After your physician reviews your transmission, you will receive a postcard with your next transmission date.       Thank you for choosing [facility] !

## 2016-08-11 DIAGNOSIS — H353113 Nonexudative age-related macular degeneration, right eye, advanced atrophic without subfoveal involvement: Secondary | ICD-10-CM | POA: Diagnosis not present

## 2016-08-31 ENCOUNTER — Ambulatory Visit (HOSPITAL_COMMUNITY): Payer: Medicare Other | Attending: Adult Health

## 2016-08-31 DIAGNOSIS — R296 Repeated falls: Secondary | ICD-10-CM | POA: Insufficient documentation

## 2016-08-31 DIAGNOSIS — R262 Difficulty in walking, not elsewhere classified: Secondary | ICD-10-CM

## 2016-08-31 DIAGNOSIS — B029 Zoster without complications: Secondary | ICD-10-CM

## 2016-08-31 DIAGNOSIS — R2681 Unsteadiness on feet: Secondary | ICD-10-CM | POA: Diagnosis not present

## 2016-08-31 DIAGNOSIS — Z9181 History of falling: Secondary | ICD-10-CM | POA: Insufficient documentation

## 2016-08-31 HISTORY — DX: Zoster without complications: B02.9

## 2016-08-31 NOTE — Therapy (Signed)
[facility] [address] [place], [place], [address] Phone: [phone]   Fax:  [phone]  Physical Therapy Evaluation  Patient Details  Name: ZYAH GOMM MRN: [id number] Date of Birth: 08-Jul-1921 Referring Provider: Esmeralda Links   Encounter Date: 08/31/2016      PT End of Session - 08/31/16 1608    Visit Number 1   Number of Visits 16   Date for PT Re-Evaluation 09/28/16   Authorization Type BCBS Medicare   Authorization Time Period 08/31/16-10/29/16   Authorization - Visit Number 1   Authorization - Number of Visits 10   PT Start Time 1350   PT Stop Time 1431   PT Time Calculation (min) 41 min   Equipment Utilized During Treatment Gait belt   Activity Tolerance Patient tolerated treatment well;No increased pain      Past Medical History:  Diagnosis Date  . Coronary artery disease    stent in the LAD artery in 2002  . HTN (hypertension) 08/30/2012  . Hyperlipidemia 08/30/2012  . ICD (implantable cardiac defibrillator) in place   . Macular degeneration   . Pacemaker 08/29/2012   st jude accent DR RF device, model number [id number], serial number [id number], implanted 08/29/2012 for sinus bradycardia, runs of supraventricular tachycardia  last checked 10/23/2012  . Polyneuropathy in other diseases classified elsewhere (HCC)   . SSS (sick sinus syndrome) with PAT and marked bradycardia with 3 sec pauses  08/30/2012  . Status post placement of cardiac pacemaker. 08/29/12 St. Jude device 08/30/2012  . Venous insufficiency     Past Surgical History:  Procedure Laterality Date  . ABDOMINAL HYSTERECTOMY    . CARDIAC CATHETERIZATION  2006   3 stents placed  . CARDIAC CATHETERIZATION  06/2001   placement of BiodivYsio 2.5.10mm stent dilated to 2.91mm in proximal left anterior descenting stenotic lesion  . CESAREAN SECTION    . COLONOSCOPY W/ POLYPECTOMY    . HIP ARTHROPLASTY Left 01/04/2014   Procedure: ARTHROPLASTY BIPOLAR HIP;   Surgeon: Shelda Pal, MD;  Location: [facility] OR;  Service: Orthopedics;  Laterality: Left;  . INSERT / REPLACE / REMOVE PACEMAKER  08/29/2012   st jude   . KIDNEY STONE SURGERY    . OPEN REDUCTION INTERNAL FIXATION (ORIF) DISTAL RADIAL FRACTURE Right 04/01/2016   Procedure: OPEN REDUCTION INTERNAL FIXATION (ORIF) RIGHT DISTAL RADIAL FRACTURE AND REPAIR;  Surgeon: Bradly Bienenstock, MD;  [facility] OR;  Service: Orthopedics;  Laterality: Right;  . PERMANENT PACEMAKER INSERTION N/A 08/29/2012   Procedure: PERMANENT PACEMAKER INSERTION;  Surgeon: Thurmon Fair, MD;  [facility]: [facility] CATH LAB;  Service: Cardiovascular;  Laterality: N/A;  . RADIOFREQUENCY ABLATION  08/25/2010   ablation of left greater saphenous vein  . ROTATOR CUFF REPAIR Left   . ROTATOR CUFF REPAIR Right   . TOTAL HIP REVISION Left 07/24/2015   Procedure: ORIF LEFT PERIPROSTHETIC FEMUR FRACTURE; CONVERSION TO TOTAL HIP ARTHROPLASTY POSS. FEMORAL COMPONENT REVISION ;  Surgeon: Samson Frederic, MD;  [facility] OR;  Service: Orthopedics;  Laterality: Left;    There were no vitals filed for this visit.       Subjective Assessment - 08/31/16 1353    Subjective Pt reports she has felt vry unsteady on her feet as of late since September and now with increaseing occurence of falls. Reports she has had no falls in the past 3 months.    Currently in Pain? No/denies            [facility] PT  Assessment - 08/31/16 0001      Assessment   Medical Diagnosis Unsteadiness on Feet   Referring Provider Megan Millican    Onset Date/Surgical Date 03/31/16   Hand Dominance Right   Prior Therapy about 1 Year ago after Lt hip fracture     Precautions   Precaution Comments Pacemaker, AMD more left than right     Balance Screen   Has the patient fallen in the past 6 months No   Has the patient had a decrease in activity level because of a fear of falling?  Yes   Is the patient reluctant to leave their home because of a fear of falling?  No      Prior Function   Level of Independence Requires assistive device for independence;Needs assistance with homemaking   Vocation Retired     Observation/Other Assessments   Focus on Therapeutic Outcomes (FOTO)  59 (41% impaired)   DGI: 10/24 = 58% impaired     Ambulation/Gait   Ambulation Distance (Feet) 225 Feet  125s   Assistive device Straight cane   Gait velocity 0.3462m/s sustained  0.7627m/s maximal (10MWT)   Gait velocity - backwards 0.697m/s   Gait Comments drifting left and right  decreased gait speed c cognittive challenges     Balance   Balance Assessed Yes     Standardized Balance Assessment   Standardized Balance Assessment Dynamic Gait Index     Dynamic Gait Index   Level Surface Mild Impairment   Change in Gait Speed Severe Impairment   Gait with Horizontal Head Turns Mild Impairment   Gait with Vertical Head Turns Mild Impairment   Gait and Pivot Turn Moderate Impairment   Step Over Obstacle Moderate Impairment   Step Around Obstacles Moderate Impairment   Steps Moderate Impairment   Total Score 10   DGI comment: 58% impaired     High Level Balance   High Level Balance Comments SLS: Rt=3 sec; Lt=1sec  narrow stance firm: 30+ seconds                   [facility] Adult PT Treatment/Exercise - 08/31/16 0001      Exercises   Exercises Knee/Hip     Knee/Hip Exercises: Seated   Sit to Sand 10 reps;without UE support  setup for safety (HEP ed)      Knee/Hip Exercises: Supine   Bridges Other (comment)  demonstrated and discussed for HEP             Balance Exercises - 08/31/16 1600      Balance Exercises: Standing   Tandem Stance Eyes open;Intermittent upper extremity support;10 secs;5 reps  safety set up for HEP ed           PT Education - 08/31/16 1607    Education provided Yes   Education Details explained that balance defciits are minimal to moderate, but power deficits are more concerning for the ability to recover losses of  stability.    Person(s) Educated Patient   Methods Explanation   Comprehension Verbalized understanding          PT Short Term Goals - 08/31/16 1620      PT SHORT TERM GOAL #1   Title After 4 weeks patient will demonstrate improved balance AEB DGI score 15/24.    Baseline 10/24    Status New     PT SHORT TERM GOAL #2   Title After 4 weeks patient will demonstrate improved gait speed, both self selected (>0.4151m/s)  and maximal (0.27m/s)    Status New     PT SHORT TERM GOAL #3   Title After 4 weeks patient will demosntrate improved power output AEB 5xSTS in less than 20 seconds.    Status New           PT Long Term Goals - 09-22-16 1625      PT LONG TERM GOAL #1   Title After 8 weeks patient will demosntrate improved power output AEB 5xSTS in less than 16 seconds.    Status New     PT LONG TERM GOAL #2   Title After 8 weeks patient will demonstrate improved gait speed, both self selected (>0.44m/s) and maximal (1.21m/s)      PT LONG TERM GOAL #3   Title After 8 weeks patient will demonstrate improved balance AEB DGI score 19/24.    Status New               Plan - 09/22/16 1612    Clinical Impression Statement Pt presenting today for PT evalaution to address concerns over gradual decline in mobility, activity tolerance, stability, balance, and now with recurrent falls. The patient demosntrates increased falls risk AEB sustained, self-selected gait speed of <0.6 m/s and a maximal gait speed of near identiical value. This lack of differential between self  selected gait and maximal gait demonstrates a decline in power output which would limit the patients ability to recover losses of balance. Balance is also noted to be impaired AEB DGI 10/24. Funcitonal weakness noted during transfers and perfo0rmance of stairs with need to UE assistance and excessive dynamic valgus. The patient will benefit from skille dPT intervention to address these deficits in order to improve  safety, decreae falls risk, improve falls recovery, a=nd restore quality of life.    Rehab Potential Good   PT Frequency 2x / week   PT Duration 8 weeks   PT Treatment/Interventions ADLs/Self Care Home Management;Electrical Stimulation;Gait training;Stair training;Functional mobility training;Therapeutic activities;Therapeutic exercise;Balance training;Patient/family education;Passive range of motion;Manual techniques;Dry needling   PT Next Visit Plan Review HEP in full, treatment goals, and begin power training.    PT Home Exercise Plan At eval: bridging, semi tandem stance, sit to stand transfers   Consulted and Agree with Plan of Care Patient      Patient will benefit from skilled therapeutic intervention in order to improve the following deficits and impairments:  Abnormal gait, Decreased coordination, Decreased range of motion, Difficulty walking, Decreased endurance, Increased muscle spasms, Decreased activity tolerance, Decreased balance, Decreased mobility, Decreased strength, Hypomobility, Impaired flexibility, Improper body mechanics, Postural dysfunction  Visit Diagnosis: Repeated falls - Plan: PT plan of care cert/re-cert  Unsteadiness on feet - Plan: PT plan of care cert/re-cert  Difficulty in walking, not elsewhere classified - Plan: PT plan of care cert/re-cert      G-Codes - 09/22/16 1626    Functional Assessment Tool Used FOTO/ DGI    Functional Limitation Mobility: Walking and moving around   Mobility: Walking and Moving Around Current Status [phone]) At least 40 percent but less than 60 percent impaired, limited or restricted   Mobility: Walking and Moving Around Goal Status [phone]) At least 40 percent but less than 60 percent impaired, limited or restricted       Problem List Patient Active Problem List   Diagnosis Date Noted  . Urinary frequency 08/10/2015  . Postoperative anemia due to acute blood loss 07/27/2015  . SVT (supraventricular tachycardia) (HCC)  07/26/2015  . Femur fracture, left (  HCC) 07/24/2015  . Chronic kidney disease 07/24/2015  . Periprosthetic fracture around internal prosthetic left hip joint (HCC) 07/24/2015  . Fall at home   . DVT (deep venous thrombosis) (HCC) 01/30/2014  . Occult blood positive stool 01/30/2014  . Thrombocytopenia, unspecified (HCC) 01/06/2014  . Anemia, iron deficiency 01/06/2014  . Hip fracture, left (HCC) 01/03/2014  . QT prolongation 01/03/2014  . Atherosclerotic cardiovascular disease 08/13/2013  . Hypertensive emergency 04/08/2013  . Dizzy 04/08/2013  . PAT (paroxysmal atrial tachycardia) (HCC) 12/25/2012  . SSS (sick sinus syndrome) with PAT and marked bradycardia with 3 sec pauses  08/30/2012  . Status post placement of cardiac pacemaker. 08/29/12 St. Jude device 08/30/2012  . HTN (hypertension) 08/30/2012  . Hyperlipidemia 08/30/2012  . Osteoporosis, unspecified 04/19/2012  . Essential and other specified forms of tremor 04/19/2012  . Essential hypertension 04/19/2012  . Cardiac dysrhythmia, unspecified 04/19/2012  . Chronic ischemic heart disease 04/19/2012  . Hereditary and idiopathic peripheral neuropathy 04/19/2012  . Memory loss 04/19/2012  . Disturbance of skin sensation 04/19/2012  . Pain in joint, shoulder region 11/20/2011  . Status post complete repair of rotator cuff 11/20/2011  . Muscle weakness (generalized) 11/20/2011    4:34 PM, 08/31/16 Rosamaria Lints, PT, DPT Physical Therapist at [facility] [phone] (office)      [facility] [address] [place], [place], [address] Phone: [phone]   Fax:  [phone]  Name: FANTASHA DANIELE MRN: [id number] Date of Birth: July 03, 1921

## 2016-09-05 ENCOUNTER — Ambulatory Visit (HOSPITAL_COMMUNITY): Payer: Medicare Other | Admitting: Physical Therapy

## 2016-09-05 DIAGNOSIS — Z9181 History of falling: Secondary | ICD-10-CM

## 2016-09-05 DIAGNOSIS — R262 Difficulty in walking, not elsewhere classified: Secondary | ICD-10-CM | POA: Diagnosis not present

## 2016-09-05 DIAGNOSIS — R2681 Unsteadiness on feet: Secondary | ICD-10-CM | POA: Diagnosis not present

## 2016-09-05 DIAGNOSIS — R296 Repeated falls: Secondary | ICD-10-CM | POA: Diagnosis not present

## 2016-09-05 NOTE — Therapy (Signed)
[facility] [address] [place], [place], [address] Phone: [phone]   Fax:  [phone]  Physical Therapy Treatment  Patient Details  Name: Pamela [name] MRN: [id number] Date of Birth: 15-Jul-1921 Referring Provider: Esmeralda Links   Encounter Date: 09/05/2016      PT End of Session - 09/05/16 1114    Visit Number 2   Number of Visits 16   Date for PT Re-Evaluation 09/28/16   Authorization Type BCBS Medicare   Authorization Time Period 08/31/16-10/29/16   Authorization - Visit Number 2   Authorization - Number of Visits 10   PT Start Time 1030   PT Stop Time 1110   PT Time Calculation (min) 40 min   Equipment Utilized During Treatment Gait belt   Activity Tolerance Patient tolerated treatment well;No increased pain      Past Medical History:  Diagnosis Date  . Coronary artery disease    stent in the LAD artery in 2002  . HTN (hypertension) 08/30/2012  . Hyperlipidemia 08/30/2012  . ICD (implantable cardiac defibrillator) in place   . Macular degeneration   . Pacemaker 08/29/2012   st jude accent DR RF device, model number [id number], serial number [id number], implanted 08/29/2012 for sinus bradycardia, runs of supraventricular tachycardia  last checked 10/23/2012  . Polyneuropathy in other diseases classified elsewhere (HCC)   . SSS (sick sinus syndrome) with PAT and marked bradycardia with 3 sec pauses  08/30/2012  . Status post placement of cardiac pacemaker. 08/29/12 St. Jude device 08/30/2012  . Venous insufficiency     Past Surgical History:  Procedure Laterality Date  . ABDOMINAL HYSTERECTOMY    . CARDIAC CATHETERIZATION  2006   3 stents placed  . CARDIAC CATHETERIZATION  06/2001   placement of BiodivYsio 2.5.10mm stent dilated to 2.5mm in proximal left anterior descenting stenotic lesion  . CESAREAN SECTION    . COLONOSCOPY W/ POLYPECTOMY    . HIP ARTHROPLASTY Left 01/04/2014   Procedure: ARTHROPLASTY BIPOLAR HIP;  Surgeon:  Shelda Pal, MD;  Location: [facility] OR;  Service: Orthopedics;  Laterality: Left;  . INSERT / REPLACE / REMOVE PACEMAKER  08/29/2012   st jude   . KIDNEY STONE SURGERY    . OPEN REDUCTION INTERNAL FIXATION (ORIF) DISTAL RADIAL FRACTURE Right 04/01/2016   Procedure: OPEN REDUCTION INTERNAL FIXATION (ORIF) RIGHT DISTAL RADIAL FRACTURE AND REPAIR;  Surgeon: Bradly Bienenstock, MD;  Location: [facility] OR;  Service: Orthopedics;  Laterality: Right;  . PERMANENT PACEMAKER INSERTION N/A 08/29/2012   Procedure: PERMANENT PACEMAKER INSERTION;  Surgeon: Thurmon Fair, MD;  Location: [facility] CATH LAB;  Service: Cardiovascular;  Laterality: N/A;  . RADIOFREQUENCY ABLATION  08/25/2010   ablation of left greater saphenous vein  . ROTATOR CUFF REPAIR Left   . ROTATOR CUFF REPAIR Right   . TOTAL HIP REVISION Left 07/24/2015   Procedure: ORIF LEFT PERIPROSTHETIC FEMUR FRACTURE; CONVERSION TO TOTAL HIP ARTHROPLASTY POSS. FEMORAL COMPONENT REVISION ;  Surgeon: Samson Frederic, MD;  Location: [facility] OR;  Service: Orthopedics;  Laterality: Left;    There were no vitals filed for this visit.      Subjective Assessment - 09/05/16 1040    Subjective Pt has not had a chance to work on the exercise given to her at the initial evaluation.    Currently in Pain? No/denies                         [facility] Adult PT Treatment/Exercise -  09/05/16 0001      Ambulation/Gait   Pre-Gait Activities working on getting one foot ahead of the other.      Exercises   Exercises Knee/Hip     Knee/Hip Exercises: Standing   Heel Raises Both;10 reps   Other Standing Knee Exercises side stepping down back hall x 1 RT    Other Standing Knee Exercises Attempt down back hall in 29 or less steps x 5.      Knee/Hip Exercises: Seated   Sit to Sand 10 reps  in slow motion              Balance Exercises - 09/05/16 1113      Balance Exercises: Standing   Tandem Stance Eyes open;2 reps  with head turns    Marching Limitations x10              PT Short Term Goals - 09/05/16 1117      PT SHORT TERM GOAL #1   Title After 4 weeks patient will demonstrate improved balance AEB DGI score 15/24.    Baseline 10/24    Status On-going     PT SHORT TERM GOAL #2   Title After 4 weeks patient will demonstrate improved gait speed, both self selected (>0.5957m/s) and maximal (0.3551m/s)    Status On-going     PT SHORT TERM GOAL #3   Title After 4 weeks patient will demosntrate improved power output AEB 5xSTS in less than 20 seconds.    Status On-going           PT Long Term Goals - 09/05/16 1117      PT LONG TERM GOAL #1   Title After 8 weeks patient will demosntrate improved power output AEB 5xSTS in less than 16 seconds.    Status On-going     PT LONG TERM GOAL #2   Title After 8 weeks patient will demonstrate improved gait speed, both self selected (>0.6930m/s) and maximal (1.3773m/s)    Status On-going     PT LONG TERM GOAL #3   Title After 8 weeks patient will demonstrate improved balance AEB DGI score 19/24.    Status On-going               Plan - 09/05/16 1115    Clinical Impression Statement Evaluation and goals reviewed with patient.  Pt given a copy of pt evaluation.  Pt encouraged to complete her HEP at home.  Pt treatment focused on gait, banlance and strengthening with pt needing two small rest breaks.    Rehab Potential Good   PT Frequency 2x / week   PT Duration 8 weeks   PT Treatment/Interventions ADLs/Self Care Home Management;Electrical Stimulation;Gait training;Stair training;Functional mobility training;Therapeutic activities;Therapeutic exercise;Balance training;Patient/family education;Passive range of motion;Manual techniques;Dry needling   PT Next Visit Plan Review HEP in full, treatment goals, and begin power training.    PT Home Exercise Plan At eval: bridging, semi tandem stance, sit to stand transfers; Pt to work on gait concentrating on one foot completely in front of the other .    Consulted and Agree with Plan of Care Patient      Patient will benefit from skilled therapeutic intervention in order to improve the following deficits and impairments:  Abnormal gait, Decreased coordination, Decreased range of motion, Difficulty walking, Decreased endurance, Increased muscle spasms, Decreased activity tolerance, Decreased balance, Decreased mobility, Decreased strength, Hypomobility, Impaired flexibility, Improper body mechanics, Postural dysfunction  Visit Diagnosis: Repeated falls  Unsteadiness on feet  Difficulty in walking, not elsewhere classified  History of falling     Problem List Patient Active Problem List   Diagnosis Date Noted  . Urinary frequency 08/10/2015  . Postoperative anemia due to acute blood loss 07/27/2015  . SVT (supraventricular tachycardia) (HCC) 07/26/2015  . Femur fracture, left (HCC) 07/24/2015  . Chronic kidney disease 07/24/2015  . Periprosthetic fracture around internal prosthetic left hip joint (HCC) 07/24/2015  . Fall at home   . DVT (deep venous thrombosis) (HCC) 01/30/2014  . Occult blood positive stool 01/30/2014  . Thrombocytopenia, unspecified (HCC) 01/06/2014  . Anemia, iron deficiency 01/06/2014  . Hip fracture, left (HCC) 01/03/2014  . QT prolongation 01/03/2014  . Atherosclerotic cardiovascular disease 08/13/2013  . Hypertensive emergency 04/08/2013  . Dizzy 04/08/2013  . PAT (paroxysmal atrial tachycardia) (HCC) 12/25/2012  . SSS (sick sinus syndrome) with PAT and marked bradycardia with 3 sec pauses  08/30/2012  . Status post placement of cardiac pacemaker. 08/29/12 St. Jude device 08/30/2012  . HTN (hypertension) 08/30/2012  . Hyperlipidemia 08/30/2012  . Osteoporosis, unspecified 04/19/2012  . Essential and other specified forms of tremor 04/19/2012  . Essential hypertension 04/19/2012  . Cardiac dysrhythmia, unspecified 04/19/2012  . Chronic ischemic heart disease 04/19/2012  . Hereditary and idiopathic  peripheral neuropathy 04/19/2012  . Memory loss 04/19/2012  . Disturbance of skin sensation 04/19/2012  . Pain in joint, shoulder region 11/20/2011  . Status post complete repair of rotator cuff 11/20/2011  . Muscle weakness (generalized) 11/20/2011    Virgina Organ, PT CLT [phone] 09/05/2016, 11:18 AM  [facility] [address] [place], [place], [address] Phone: [phone]   Fax:  [phone]  Name: Pamela [name] MRN: [id number] Date of Birth: Oct 31, 1920

## 2016-09-07 ENCOUNTER — Ambulatory Visit (HOSPITAL_COMMUNITY): Payer: Medicare Other

## 2016-09-07 DIAGNOSIS — Z9181 History of falling: Secondary | ICD-10-CM | POA: Diagnosis not present

## 2016-09-07 DIAGNOSIS — R2681 Unsteadiness on feet: Secondary | ICD-10-CM | POA: Diagnosis not present

## 2016-09-07 DIAGNOSIS — R262 Difficulty in walking, not elsewhere classified: Secondary | ICD-10-CM | POA: Diagnosis not present

## 2016-09-07 DIAGNOSIS — R296 Repeated falls: Secondary | ICD-10-CM | POA: Diagnosis not present

## 2016-09-07 NOTE — Therapy (Signed)
[facility] [address] [place], [place], [address] Phone: [phone]   Fax:  [phone]  Physical Therapy Treatment  Patient Details  Name: Pamela [name] MRN: [id number] Date of Birth: [birth year] Referring Provider: Esmeralda Links   Encounter Date: 09/07/2016      PT End of Session - 09/07/16 1049    Visit Number 3   Number of Visits 16   Date for PT Re-Evaluation 09/28/16   Authorization Type BCBS Medicare   Authorization Time Period 08/31/16-10/29/16   Authorization - Visit Number 3   Authorization - Number of Visits 10   PT Start Time 1039   PT Stop Time 1117   PT Time Calculation (min) 38 min   Equipment Utilized During Treatment Gait belt   Activity Tolerance Patient tolerated treatment well;No increased pain;Patient limited by fatigue   Behavior During Therapy [facility] for tasks assessed/performed      Past Medical History:  Diagnosis Date  . Coronary artery disease    stent in the LAD artery in 2002  . HTN (hypertension) 08/30/2012  . Hyperlipidemia 08/30/2012  . ICD (implantable cardiac defibrillator) in place   . Macular degeneration   . Pacemaker 08/29/2012   st jude accent DR RF device, model number [id number], serial number [id number], implanted 08/29/2012 for sinus bradycardia, runs of supraventricular tachycardia  last checked 10/23/2012  . Polyneuropathy in other diseases classified elsewhere (HCC)   . SSS (sick sinus syndrome) with PAT and marked bradycardia with 3 sec pauses  08/30/2012  . Status post placement of cardiac pacemaker. 08/29/12 St. Jude device 08/30/2012  . Venous insufficiency     Past Surgical History:  Procedure Laterality Date  . ABDOMINAL HYSTERECTOMY    . CARDIAC CATHETERIZATION  2006   3 stents placed  . CARDIAC CATHETERIZATION  06/2001   placement of BiodivYsio 2.5.10mm stent dilated to 2.59mm in proximal left anterior descenting stenotic lesion  . CESAREAN SECTION    . COLONOSCOPY W/ POLYPECTOMY     . HIP ARTHROPLASTY Left 01/04/2014   Procedure: ARTHROPLASTY BIPOLAR HIP;  Surgeon: Shelda Pal, MD;  Location: [facility] OR;  Service: Orthopedics;  Laterality: Left;  . INSERT / REPLACE / REMOVE PACEMAKER  08/29/2012   st jude   . KIDNEY STONE SURGERY    . OPEN REDUCTION INTERNAL FIXATION (ORIF) DISTAL RADIAL FRACTURE Right 04/01/2016   Procedure: OPEN REDUCTION INTERNAL FIXATION (ORIF) RIGHT DISTAL RADIAL FRACTURE AND REPAIR;  Surgeon: Bradly Bienenstock, MD;  Location: [facility] OR;  Service: Orthopedics;  Laterality: Right;  . PERMANENT PACEMAKER INSERTION N/A 08/29/2012   Procedure: PERMANENT PACEMAKER INSERTION;  Surgeon: Thurmon Fair, MD;  Location: [facility] CATH LAB;  Service: Cardiovascular;  Laterality: N/A;  . RADIOFREQUENCY ABLATION  08/25/2010   ablation of left greater saphenous vein  . ROTATOR CUFF REPAIR Left   . ROTATOR CUFF REPAIR Right   . TOTAL HIP REVISION Left 07/24/2015   Procedure: ORIF LEFT PERIPROSTHETIC FEMUR FRACTURE; CONVERSION TO TOTAL HIP ARTHROPLASTY POSS. FEMORAL COMPONENT REVISION ;  Surgeon: Samson Frederic, MD;  Location: [facility] OR;  Service: Orthopedics;  Laterality: Left;    There were no vitals filed for this visit.      Subjective Assessment - 09/07/16 1047    Subjective Pt reports she is doing well. She says HEP is going ok.    Currently in Pain? No/denies  [facility] Adult PT Treatment/Exercise - 09/07/16 0001      Knee/Hip Exercises: Standing   Heel Raises Both;2 sets;20 reps     Knee/Hip Exercises: Seated   Sit to Sand 10 reps;without UE support;2 sets  noted valgus due to quads dominance over hip extensors.      Knee/Hip Exercises: Supine   Bridges Both;10 reps  3x10   Other Supine Knee/Hip Exercises Supine bent knee raise: 3x10 bilat             Balance Exercises - 09/07/16 1055      Balance Exercises: Standing   Partial Tandem Stance Eyes open;5 reps;10 secs;Intermittent upper extremity support     OTAGO  PROGRAM   Backwards Walking Support   Sideways Walking Assistive device             PT Short Term Goals - 09/05/16 1117      PT SHORT TERM GOAL #1   Title After 4 weeks patient will demonstrate improved balance AEB DGI score 15/24.    Baseline 10/24    Status On-going     PT SHORT TERM GOAL #2   Title After 4 weeks patient will demonstrate improved gait speed, both self selected (>0.41m/s) and maximal (0.80m/s)    Status On-going     PT SHORT TERM GOAL #3   Title After 4 weeks patient will demosntrate improved power output AEB 5xSTS in less than 20 seconds.    Status On-going           PT Long Term Goals - 09/05/16 1117      PT LONG TERM GOAL #1   Title After 8 weeks patient will demosntrate improved power output AEB 5xSTS in less than 16 seconds.    Status On-going     PT LONG TERM GOAL #2   Title After 8 weeks patient will demonstrate improved gait speed, both self selected (>0.56m/s) and maximal (1.86m/s)    Status On-going     PT LONG TERM GOAL #3   Title After 8 weeks patient will demonstrate improved balance AEB DGI score 19/24.    Status On-going               Plan - 09/07/16 1050    Clinical Impression Statement Session tolerated well today, requiring moderate verbal cues to perform exercises with correct form and movement, even after performed multiple sets prior (some short term memory deficit). Pt notably weak with bridging, with very limited excursion from mat. Pt reports mild cramping at beginning of bridging, which abates. Good progress toward goals overall. No changes to POC at this tiime. Extensive VC required to perform semitandem stance exactly as indicated for HEP, which is concerned for HEP consistency and compliance. Pt struggles with TKE in semitandem stance. Focus is brought to strengthening hip extensors and improving hip extension A/ROM, as well as ankle PF strrength.    Rehab Potential Good   PT Frequency 2x / week   PT Duration 8  weeks   PT Treatment/Interventions ADLs/Self Care Home Management;Electrical Stimulation;Gait training;Stair training;Functional mobility training;Therapeutic activities;Therapeutic exercise;Balance training;Patient/family education;Passive range of motion;Manual techniques;Dry needling   PT Next Visit Plan Review HEP in full, treatment goals, and begin power training.    PT Home Exercise Plan At eval: bridging, semi tandem stance, sit to stand transfers; Pt to work on gait concentrating on achieving a step-through gait.    Consulted and Agree with Plan of Care Patient      Patient will benefit from skilled  therapeutic intervention in order to improve the following deficits and impairments:  Abnormal gait, Decreased coordination, Decreased range of motion, Difficulty walking, Decreased endurance, Increased muscle spasms, Decreased activity tolerance, Decreased balance, Decreased mobility, Decreased strength, Hypomobility, Impaired flexibility, Improper body mechanics, Postural dysfunction  Visit Diagnosis: Repeated falls  Unsteadiness on feet  Difficulty in walking, not elsewhere classified     Problem List Patient Active Problem List   Diagnosis Date Noted  . Urinary frequency 08/10/2015  . Postoperative anemia due to acute blood loss 07/27/2015  . SVT (supraventricular tachycardia) (HCC) 07/26/2015  . Femur fracture, left (HCC) 07/24/2015  . Chronic kidney disease 07/24/2015  . Periprosthetic fracture around internal prosthetic left hip joint (HCC) 07/24/2015  . Fall at home   . DVT (deep venous thrombosis) (HCC) 01/30/2014  . Occult blood positive stool 01/30/2014  . Thrombocytopenia, unspecified (HCC) 01/06/2014  . Anemia, iron deficiency 01/06/2014  . Hip fracture, left (HCC) 01/03/2014  . QT prolongation 01/03/2014  . Atherosclerotic cardiovascular disease 08/13/2013  . Hypertensive emergency 04/08/2013  . Dizzy 04/08/2013  . PAT (paroxysmal atrial tachycardia) (HCC)  12/25/2012  . SSS (sick sinus syndrome) with PAT and marked bradycardia with 3 sec pauses  08/30/2012  . Status post placement of cardiac pacemaker. 08/29/12 St. Jude device 08/30/2012  . HTN (hypertension) 08/30/2012  . Hyperlipidemia 08/30/2012  . Osteoporosis, unspecified 04/19/2012  . Essential and other specified forms of tremor 04/19/2012  . Essential hypertension 04/19/2012  . Cardiac dysrhythmia, unspecified 04/19/2012  . Chronic ischemic heart disease 04/19/2012  . Hereditary and idiopathic peripheral neuropathy 04/19/2012  . Memory loss 04/19/2012  . Disturbance of skin sensation 04/19/2012  . Pain in joint, shoulder region 11/20/2011  . Status post complete repair of rotator cuff 11/20/2011  . Muscle weakness (generalized) 11/20/2011    11:07 AM, 09/07/16 Rosamaria LintsAllan C Navon Kotowski, PT, DPT Physical Therapist at [facility] [phone] (office)     [facility] [address] [place], [place], [address] Phone: [phone]   Fax:  [phone]  Name: Rosanne Sackreva N Mcculloh MRN: [id number] Date of Birth: [birth year]

## 2016-09-12 ENCOUNTER — Ambulatory Visit (HOSPITAL_COMMUNITY): Payer: Medicare Other | Admitting: Physical Therapy

## 2016-09-12 ENCOUNTER — Telehealth (HOSPITAL_COMMUNITY): Payer: Self-pay | Admitting: Physical Therapy

## 2016-09-12 DIAGNOSIS — R2681 Unsteadiness on feet: Secondary | ICD-10-CM | POA: Diagnosis not present

## 2016-09-12 DIAGNOSIS — R262 Difficulty in walking, not elsewhere classified: Secondary | ICD-10-CM | POA: Diagnosis not present

## 2016-09-12 DIAGNOSIS — R296 Repeated falls: Secondary | ICD-10-CM | POA: Diagnosis not present

## 2016-09-12 DIAGNOSIS — Z9181 History of falling: Secondary | ICD-10-CM | POA: Diagnosis not present

## 2016-09-12 NOTE — Therapy (Signed)
[facility] [address] [place], [place], [address] Phone: [phone]   Fax:  [phone]  Physical Therapy Treatment  Patient Details  Name: Pamela [name] MRN: [id number] Date of Birth: 08/26/1920 Referring Provider: Esmeralda Links   Encounter Date: 09/12/2016      PT End of Session - 09/12/16 1132    Visit Number 4   Number of Visits 16   Date for PT Re-Evaluation 09/28/16   Authorization Type BCBS Medicare   Authorization Time Period 08/31/16-10/29/16   Authorization - Visit Number 4   Authorization - Number of Visits 10   PT Start Time 1005   PT Stop Time 1032   PT Time Calculation (min) 27 min   Equipment Utilized During Treatment Gait belt   Activity Tolerance Patient tolerated treatment well;No increased pain;Patient limited by fatigue   Behavior During Therapy [facility] for tasks assessed/performed      Past Medical History:  Diagnosis Date  . Coronary artery disease    stent in the LAD artery in 2002  . HTN (hypertension) 08/30/2012  . Hyperlipidemia 08/30/2012  . ICD (implantable cardiac defibrillator) in place   . Macular degeneration   . Pacemaker 08/29/2012   st jude accent DR RF device, model number [id number], serial number [id number], implanted 08/29/2012 for sinus bradycardia, runs of supraventricular tachycardia  last checked 10/23/2012  . Polyneuropathy in other diseases classified elsewhere (HCC)   . SSS (sick sinus syndrome) with PAT and marked bradycardia with 3 sec pauses  08/30/2012  . Status post placement of cardiac pacemaker. 08/29/12 St. Jude device 08/30/2012  . Venous insufficiency     Past Surgical History:  Procedure Laterality Date  . ABDOMINAL HYSTERECTOMY    . CARDIAC CATHETERIZATION  2006   3 stents placed  . CARDIAC CATHETERIZATION  06/2001   placement of BiodivYsio 2.5.10mm stent dilated to 2.70mm in proximal left anterior descenting stenotic lesion  . CESAREAN SECTION    . COLONOSCOPY W/  POLYPECTOMY    . HIP ARTHROPLASTY Left 01/04/2014   Procedure: ARTHROPLASTY BIPOLAR HIP;  Surgeon: Shelda Pal, MD;  Location: [facility] OR;  Service: Orthopedics;  Laterality: Left;  . INSERT / REPLACE / REMOVE PACEMAKER  08/29/2012   st jude   . KIDNEY STONE SURGERY    . OPEN REDUCTION INTERNAL FIXATION (ORIF) DISTAL RADIAL FRACTURE Right 04/01/2016   Procedure: OPEN REDUCTION INTERNAL FIXATION (ORIF) RIGHT DISTAL RADIAL FRACTURE AND REPAIR;  Surgeon: Bradly Bienenstock, MD;  Location: [facility] OR;  Service: Orthopedics;  Laterality: Right;  . PERMANENT PACEMAKER INSERTION N/A 08/29/2012   Procedure: PERMANENT PACEMAKER INSERTION;  Surgeon: Thurmon Fair, MD;  Location: [facility] CATH LAB;  Service: Cardiovascular;  Laterality: N/A;  . RADIOFREQUENCY ABLATION  08/25/2010   ablation of left greater saphenous vein  . ROTATOR CUFF REPAIR Left   . ROTATOR CUFF REPAIR Right   . TOTAL HIP REVISION Left 07/24/2015   Procedure: ORIF LEFT PERIPROSTHETIC FEMUR FRACTURE; CONVERSION TO TOTAL HIP ARTHROPLASTY POSS. FEMORAL COMPONENT REVISION ;  Surgeon: Samson Frederic, MD;  Location: [facility] OR;  Service: Orthopedics;  Laterality: Left;    There were no vitals filed for this visit.      Subjective Assessment - 09/12/16 1034    Subjective Pt 20 minutes late this morning due to unable to get out of a phone call at home.  Pt reports complaince with exercises and having no pain this session.   Currently in Pain? No/denies  [facility] Adult PT Treatment/Exercise - 09/12/16 0001      Knee/Hip Exercises: Standing   Heel Raises Both;2 sets;20 reps   Other Standing Knee Exercises marching with toe tapping 4" box, alternating 10 reps no HHA quick as possible     Knee/Hip Exercises: Seated   Sit to Sand 10 reps;without UE support;2 sets             Balance Exercises - 09/12/16 1035      Balance Exercises: Standing   Partial Tandem Stance Eyes open;5 reps;10 secs;Intermittent upper  extremity support   Retro Gait 2 reps   Sidestepping 2 reps   Turning Right;Left  stop/go/fast/slow/turn/look up/down 2X around gym w/rest b/w             PT Short Term Goals - 09/05/16 1117      PT SHORT TERM GOAL #1   Title After 4 weeks patient will demonstrate improved balance AEB DGI score 15/24.    Baseline 10/24    Status On-going     PT SHORT TERM GOAL #2   Title After 4 weeks patient will demonstrate improved gait speed, both self selected (>0.41m/s) and maximal (0.27m/s)    Status On-going     PT SHORT TERM GOAL #3   Title After 4 weeks patient will demosntrate improved power output AEB 5xSTS in less than 20 seconds.    Status On-going           PT Long Term Goals - 09/05/16 1117      PT LONG TERM GOAL #1   Title After 8 weeks patient will demosntrate improved power output AEB 5xSTS in less than 16 seconds.    Status On-going     PT LONG TERM GOAL #2   Title After 8 weeks patient will demonstrate improved gait speed, both self selected (>0.22m/s) and maximal (1.5m/s)    Status On-going     PT LONG TERM GOAL #3   Title After 8 weeks patient will demonstrate improved balance AEB DGI score 19/24.    Status On-going               Plan - 09/12/16 1134    Clinical Impression Statement Pt late so unable to complete full session.  Focused mainly on balance/stability with emphasis on increasing speed/power and control.  Began commands, i.e. stop/go/fast/slow/head turns all with continuous motion.  Pt requires constant cues to take larger steps as with tendency to take short, shuffling steps.  Pt required 2 short (1 minute) rest breaks during session today.     Rehab Potential Good   PT Frequency 2x / week   PT Duration 8 weeks   PT Treatment/Interventions ADLs/Self Care Home Management;Electrical Stimulation;Gait training;Stair training;Functional mobility training;Therapeutic activities;Therapeutic exercise;Balance training;Patient/family  education;Passive range of motion;Manual techniques;Dry needling   PT Next Visit Plan continue with balance and stability while progressing power training (quick controlled movements).    PT Home Exercise Plan At eval: bridging, semi tandem stance, sit to stand transfers; Pt to work on gait concentrating on achieving a step-through gait.    Consulted and Agree with Plan of Care Patient      Patient will benefit from skilled therapeutic intervention in order to improve the following deficits and impairments:  Abnormal gait, Decreased coordination, Decreased range of motion, Difficulty walking, Decreased endurance, Increased muscle spasms, Decreased activity tolerance, Decreased balance, Decreased mobility, Decreased strength, Hypomobility, Impaired flexibility, Improper body mechanics, Postural dysfunction  Visit Diagnosis: Repeated falls  Unsteadiness on feet  Difficulty in walking, not elsewhere classified     Problem List Patient Active Problem List   Diagnosis Date Noted  . Urinary frequency 08/10/2015  . Postoperative anemia due to acute blood loss 07/27/2015  . SVT (supraventricular tachycardia) (HCC) 07/26/2015  . Femur fracture, left (HCC) 07/24/2015  . Chronic kidney disease 07/24/2015  . Periprosthetic fracture around internal prosthetic left hip joint (HCC) 07/24/2015  . Fall at home   . DVT (deep venous thrombosis) (HCC) 01/30/2014  . Occult blood positive stool 01/30/2014  . Thrombocytopenia, unspecified (HCC) 01/06/2014  . Anemia, iron deficiency 01/06/2014  . Hip fracture, left (HCC) 01/03/2014  . QT prolongation 01/03/2014  . Atherosclerotic cardiovascular disease 08/13/2013  . Hypertensive emergency 04/08/2013  . Dizzy 04/08/2013  . PAT (paroxysmal atrial tachycardia) (HCC) 12/25/2012  . SSS (sick sinus syndrome) with PAT and marked bradycardia with 3 sec pauses  08/30/2012  . Status post placement of cardiac pacemaker. 08/29/12 St. Jude device 08/30/2012  .  HTN (hypertension) 08/30/2012  . Hyperlipidemia 08/30/2012  . Osteoporosis, unspecified 04/19/2012  . Essential and other specified forms of tremor 04/19/2012  . Essential hypertension 04/19/2012  . Cardiac dysrhythmia, unspecified 04/19/2012  . Chronic ischemic heart disease 04/19/2012  . Hereditary and idiopathic peripheral neuropathy 04/19/2012  . Memory loss 04/19/2012  . Disturbance of skin sensation 04/19/2012  . Pain in joint, shoulder region 11/20/2011  . Status post complete repair of rotator cuff 11/20/2011  . Muscle weakness (generalized) 11/20/2011    Lurena Nidamy B Frazier, PTA/CLT [phone]  09/12/2016, 11:39 AM  [facility] [address] [place], [place], [address] Phone: [phone]   Fax:  [phone]  Name: Pamela [name] MRN: [id number] Date of Birth: March 19, 1921

## 2016-09-12 NOTE — Telephone Encounter (Signed)
l/m to check on no show. Nf 09/12/16

## 2016-09-14 ENCOUNTER — Ambulatory Visit (HOSPITAL_COMMUNITY): Payer: Medicare Other

## 2016-09-19 ENCOUNTER — Ambulatory Visit (HOSPITAL_COMMUNITY): Payer: Medicare Other | Admitting: Physical Therapy

## 2016-09-19 DIAGNOSIS — R296 Repeated falls: Secondary | ICD-10-CM

## 2016-09-19 DIAGNOSIS — Z9181 History of falling: Secondary | ICD-10-CM | POA: Diagnosis not present

## 2016-09-19 DIAGNOSIS — R2681 Unsteadiness on feet: Secondary | ICD-10-CM

## 2016-09-19 DIAGNOSIS — R262 Difficulty in walking, not elsewhere classified: Secondary | ICD-10-CM | POA: Diagnosis not present

## 2016-09-19 NOTE — Therapy (Signed)
[facility] [address] [place], [place], [address] Phone: [phone]   Fax:  [phone]  Physical Therapy Treatment  Patient Details  Name: Pamela [name] MRN: [id number] Date of Birth: 1921/01/09 Referring Provider: Esmeralda Links   Encounter Date: 09/19/2016      PT End of Session - 09/19/16 1134    Visit Number 5   Number of Visits 16   Date for PT Re-Evaluation 09/28/16   Authorization Type BCBS Medicare   Authorization Time Period 08/31/16-10/29/16   Authorization - Visit Number 5   Authorization - Number of Visits 10   PT Start Time 1035   PT Stop Time 1120   PT Time Calculation (min) 45 min   Equipment Utilized During Treatment Gait belt   Activity Tolerance Patient tolerated treatment well;No increased pain;Patient limited by fatigue   Behavior During Therapy [facility] for tasks assessed/performed      Past Medical History:  Diagnosis Date  . Coronary artery disease    stent in the LAD artery in 2002  . HTN (hypertension) 08/30/2012  . Hyperlipidemia 08/30/2012  . ICD (implantable cardiac defibrillator) in place   . Macular degeneration   . Pacemaker 08/29/2012   st jude accent DR RF device, model number [id number], serial number [id number], implanted 08/29/2012 for sinus bradycardia, runs of supraventricular tachycardia  last checked 10/23/2012  . Polyneuropathy in other diseases classified elsewhere (HCC)   . SSS (sick sinus syndrome) with PAT and marked bradycardia with 3 sec pauses  08/30/2012  . Status post placement of cardiac pacemaker. 08/29/12 St. Jude device 08/30/2012  . Venous insufficiency     Past Surgical History:  Procedure Laterality Date  . ABDOMINAL HYSTERECTOMY    . CARDIAC CATHETERIZATION  2006   3 stents placed  . CARDIAC CATHETERIZATION  06/2001   placement of BiodivYsio 2.5.10mm stent dilated to 2.8mm in proximal left anterior descenting stenotic lesion  . CESAREAN SECTION    . COLONOSCOPY W/  POLYPECTOMY    . HIP ARTHROPLASTY Left 01/04/2014   Procedure: ARTHROPLASTY BIPOLAR HIP;  Surgeon: Shelda Pal, MD;  Location: [facility] OR;  Service: Orthopedics;  Laterality: Left;  . INSERT / REPLACE / REMOVE PACEMAKER  08/29/2012   st jude   . KIDNEY STONE SURGERY    . OPEN REDUCTION INTERNAL FIXATION (ORIF) DISTAL RADIAL FRACTURE Right 04/01/2016   Procedure: OPEN REDUCTION INTERNAL FIXATION (ORIF) RIGHT DISTAL RADIAL FRACTURE AND REPAIR;  Surgeon: Bradly Bienenstock, MD;  Location: [facility] OR;  Service: Orthopedics;  Laterality: Right;  . PERMANENT PACEMAKER INSERTION N/A 08/29/2012   Procedure: PERMANENT PACEMAKER INSERTION;  Surgeon: Thurmon Fair, MD;  Location: [facility] CATH LAB;  Service: Cardiovascular;  Laterality: N/A;  . RADIOFREQUENCY ABLATION  08/25/2010   ablation of left greater saphenous vein  . ROTATOR CUFF REPAIR Left   . ROTATOR CUFF REPAIR Right   . TOTAL HIP REVISION Left 07/24/2015   Procedure: ORIF LEFT PERIPROSTHETIC FEMUR FRACTURE; CONVERSION TO TOTAL HIP ARTHROPLASTY POSS. FEMORAL COMPONENT REVISION ;  Surgeon: Samson Frederic, MD;  Location: [facility] OR;  Service: Orthopedics;  Laterality: Left;    There were no vitals filed for this visit.      Subjective Assessment - 09/19/16 1138    Subjective Pt states she is doing well today.  Reports no pain, however admits to not completeing HEP as she should due to dealing with her taxes.     Currently in Pain? No/denies  [facility] Adult PT Treatment/Exercise - 09/19/16 0001      Knee/Hip Exercises: Standing   Heel Raises Both;2 sets;20 reps   Forward Lunges Both;10 reps   Forward Lunges Limitations onto 4" box with no UE's for balance/stability   Gait Training with SPC working on larger steps, arm swings proper use of SPC   Other Standing Knee Exercises marching with toe tapping 4" box, alternating 10 reps no HHA quick as possible     Knee/Hip Exercises: Seated   Sit to Sand 10 reps;without UE support;2  sets             Balance Exercises - 09/19/16 1108      Balance Exercises: Standing   Retro Gait 2 reps   Sidestepping 2 reps             PT Short Term Goals - 09/05/16 1117      PT SHORT TERM GOAL #1   Title After 4 weeks patient will demonstrate improved balance AEB DGI score 15/24.    Baseline 10/24    Status On-going     PT SHORT TERM GOAL #2   Title After 4 weeks patient will demonstrate improved gait speed, both self selected (>0.531m/s) and maximal (0.3987m/s)    Status On-going     PT SHORT TERM GOAL #3   Title After 4 weeks patient will demosntrate improved power output AEB 5xSTS in less than 20 seconds.    Status On-going           PT Long Term Goals - 09/05/16 1117      PT LONG TERM GOAL #1   Title After 8 weeks patient will demosntrate improved power output AEB 5xSTS in less than 16 seconds.    Status On-going     PT LONG TERM GOAL #2   Title After 8 weeks patient will demonstrate improved gait speed, both self selected (>0.8441m/s) and maximal (1.2569m/s)    Status On-going     PT LONG TERM GOAL #3   Title After 8 weeks patient will demonstrate improved balance AEB DGI score 19/24.    Status On-going               Plan - 09/19/16 1135    Clinical Impression Statement pt with 5 minute non-activity (bathroom and rest breaks).  Pt able to complete all other activites without pain and only minimal LOB.  Worked on gait using SPC, increasing stride and arm swing.  Pt requires cues not to reach out of BOS and take larger steps with all actvities.     Rehab Potential Good   PT Frequency 2x / week   PT Duration 8 weeks   PT Treatment/Interventions ADLs/Self Care Home Management;Electrical Stimulation;Gait training;Stair training;Functional mobility training;Therapeutic activities;Therapeutic exercise;Balance training;Patient/family education;Passive range of motion;Manual techniques;Dry needling   PT Next Visit Plan continue with balance and  stability while progressing power training (quick controlled movements).    PT Home Exercise Plan At eval: bridging, semi tandem stance, sit to stand transfers; Pt to work on gait concentrating on achieving a step-through gait.    Consulted and Agree with Plan of Care Patient      Patient will benefit from skilled therapeutic intervention in order to improve the following deficits and impairments:  Abnormal gait, Decreased coordination, Decreased range of motion, Difficulty walking, Decreased endurance, Increased muscle spasms, Decreased activity tolerance, Decreased balance, Decreased mobility, Decreased strength, Hypomobility, Impaired flexibility, Improper body mechanics, Postural dysfunction  Visit Diagnosis: Repeated falls  Unsteadiness  on feet  Difficulty in walking, not elsewhere classified     Problem List Patient Active Problem List   Diagnosis Date Noted  . Urinary frequency 08/10/2015  . Postoperative anemia due to acute blood loss 07/27/2015  . SVT (supraventricular tachycardia) (HCC) 07/26/2015  . Femur fracture, left (HCC) 07/24/2015  . Chronic kidney disease 07/24/2015  . Periprosthetic fracture around internal prosthetic left hip joint (HCC) 07/24/2015  . Fall at home   . DVT (deep venous thrombosis) (HCC) 01/30/2014  . Occult blood positive stool 01/30/2014  . Thrombocytopenia, unspecified (HCC) 01/06/2014  . Anemia, iron deficiency 01/06/2014  . Hip fracture, left (HCC) 01/03/2014  . QT prolongation 01/03/2014  . Atherosclerotic cardiovascular disease 08/13/2013  . Hypertensive emergency 04/08/2013  . Dizzy 04/08/2013  . PAT (paroxysmal atrial tachycardia) (HCC) 12/25/2012  . SSS (sick sinus syndrome) with PAT and marked bradycardia with 3 sec pauses  08/30/2012  . Status post placement of cardiac pacemaker. 08/29/12 St. Jude device 08/30/2012  . HTN (hypertension) 08/30/2012  . Hyperlipidemia 08/30/2012  . Osteoporosis, unspecified 04/19/2012  . Essential  and other specified forms of tremor 04/19/2012  . Essential hypertension 04/19/2012  . Cardiac dysrhythmia, unspecified 04/19/2012  . Chronic ischemic heart disease 04/19/2012  . Hereditary and idiopathic peripheral neuropathy 04/19/2012  . Memory loss 04/19/2012  . Disturbance of skin sensation 04/19/2012  . Pain in joint, shoulder region 11/20/2011  . Status post complete repair of rotator cuff 11/20/2011  . Muscle weakness (generalized) 11/20/2011    Lurena Nida, PTA/CLT [phone]  09/19/2016, 11:38 AM  [facility] [address] [place], [place], [address] Phone: [phone]   Fax:  [phone]  Name: Pamela [name] MRN: [id number] Date of Birth: May 21, 1921

## 2016-09-21 ENCOUNTER — Other Ambulatory Visit: Payer: Self-pay

## 2016-09-21 ENCOUNTER — Ambulatory Visit (HOSPITAL_COMMUNITY): Payer: Medicare Other

## 2016-09-21 ENCOUNTER — Encounter (HOSPITAL_COMMUNITY): Payer: Self-pay

## 2016-09-21 ENCOUNTER — Emergency Department (HOSPITAL_COMMUNITY)
Admission: EM | Admit: 2016-09-21 | Discharge: 2016-09-21 | Disposition: A | Discharge status: Home / Self Care | Payer: Medicare Other | Attending: Emergency Medicine | Admitting: Emergency Medicine

## 2016-09-21 ENCOUNTER — Emergency Department (HOSPITAL_COMMUNITY): Payer: Medicare Other

## 2016-09-21 VITALS — BP 220/99 | HR 76

## 2016-09-21 DIAGNOSIS — Z79899 Other long term (current) drug therapy: Secondary | ICD-10-CM | POA: Diagnosis not present

## 2016-09-21 DIAGNOSIS — N189 Chronic kidney disease, unspecified: Secondary | ICD-10-CM | POA: Insufficient documentation

## 2016-09-21 DIAGNOSIS — Z9181 History of falling: Secondary | ICD-10-CM | POA: Diagnosis not present

## 2016-09-21 DIAGNOSIS — Z950 Presence of cardiac pacemaker: Secondary | ICD-10-CM | POA: Diagnosis not present

## 2016-09-21 DIAGNOSIS — R2681 Unsteadiness on feet: Secondary | ICD-10-CM

## 2016-09-21 DIAGNOSIS — R296 Repeated falls: Secondary | ICD-10-CM | POA: Diagnosis not present

## 2016-09-21 DIAGNOSIS — R42 Dizziness and giddiness: Secondary | ICD-10-CM

## 2016-09-21 DIAGNOSIS — I160 Hypertensive urgency: Secondary | ICD-10-CM | POA: Insufficient documentation

## 2016-09-21 DIAGNOSIS — I2510 Atherosclerotic heart disease of native coronary artery without angina pectoris: Secondary | ICD-10-CM | POA: Diagnosis not present

## 2016-09-21 DIAGNOSIS — I129 Hypertensive chronic kidney disease with stage 1 through stage 4 chronic kidney disease, or unspecified chronic kidney disease: Secondary | ICD-10-CM | POA: Diagnosis not present

## 2016-09-21 DIAGNOSIS — R262 Difficulty in walking, not elsewhere classified: Secondary | ICD-10-CM | POA: Diagnosis not present

## 2016-09-21 DIAGNOSIS — Z7982 Long term (current) use of aspirin: Secondary | ICD-10-CM | POA: Diagnosis not present

## 2016-09-21 LAB — BASIC METABOLIC PANEL
ANION GAP: 7 (ref 5–15)
BUN: 24 mg/dL — AB (ref 6–20)
CALCIUM: 9.7 mg/dL (ref 8.9–10.3)
CO2: 29 mmol/L (ref 22–32)
Chloride: 104 mmol/L (ref 101–111)
Creatinine, Ser: 0.75 mg/dL (ref 0.44–1.00)
GFR calc Af Amer: >60 mL/min (ref >60)
GFR calc non Af Amer: >60 mL/min (ref >60)
GLUCOSE: 99 mg/dL (ref 65–99)
Potassium: 3.5 mmol/L (ref 3.5–5.1)
Sodium: 140 mmol/L (ref 135–145)

## 2016-09-21 LAB — CBG MONITORING, ED: Glucose-Capillary: 88 mg/dL (ref 65–99)

## 2016-09-21 LAB — CBC
HCT: 35.8 % — ABNORMAL LOW (ref 36.0–46.0)
HEMOGLOBIN: 11.5 g/dL — AB (ref 12.0–15.0)
MCH: 30.4 pg (ref 26.0–34.0)
MCHC: 32.1 g/dL (ref 30.0–36.0)
MCV: 94.7 fL (ref 78.0–100.0)
Platelets: 143 10*3/uL — ABNORMAL LOW (ref 150–400)
RBC: 3.78 MIL/uL — ABNORMAL LOW (ref 3.87–5.11)
RDW: 14.2 % (ref 11.5–15.5)
WBC: 4.1 10*3/uL (ref 4.0–10.5)

## 2016-09-21 LAB — URINALYSIS, ROUTINE W REFLEX MICROSCOPIC
Bilirubin Urine: NEGATIVE
Glucose, UA: NEGATIVE mg/dL
HGB URINE DIPSTICK: NEGATIVE
Ketones, ur: NEGATIVE mg/dL
Leukocytes, UA: NEGATIVE
Nitrite: NEGATIVE
PH: 8 (ref 5.0–8.0)
Protein, ur: NEGATIVE mg/dL
SPECIFIC GRAVITY, URINE: 1.004 — AB (ref 1.005–1.030)

## 2016-09-21 LAB — TROPONIN I

## 2016-09-21 MED ORDER — ACETAMINOPHEN 500 MG PO TABS
1000.0000 mg | ORAL_TABLET | Freq: Once | ORAL | Status: AC
  Administered 2016-09-21: 1000 mg via ORAL
  Filled 2016-09-21: qty 2

## 2016-09-21 MED ORDER — CLONIDINE HCL 0.1 MG PO TABS
0.1000 mg | ORAL_TABLET | Freq: Once | ORAL | Status: AC
  Administered 2016-09-21: 0.1 mg via ORAL
  Filled 2016-09-21: qty 1

## 2016-09-21 MED ORDER — CLONIDINE HCL 0.1 MG PO TABS: ORAL_TABLET | ORAL | 0 refills | Status: DC

## 2016-09-21 NOTE — ED Provider Notes (Signed)
[facility]-EMERGENCY DEPT Provider Note   CSN: [id number] Arrival date & time: 09/21/16  1136     History   Chief Complaint Chief Complaint  Patient presents with  . Dizziness    HPI Pamela [name] is a 83 y.o. female.  Patient is a 83 year old female with past medical history of coronary artery disease, hypertension, cardiac defibrillator, and osteoarthritis. She is status post bilateral hip replacements. She was at physical therapy today when she became dizzy. She describes this as a lightheadedness and feeling as if she is going to pass out. There are no aggravating or alleviating factors. She denies any chest pain or shortness of breath.  The physical therapist checked her blood pressure and it was over 200 systolic. She was sent here for evaluation of this.   The history is provided by the patient.  Dizziness  Quality:  Lightheadedness Severity:  Moderate Onset quality:  Sudden Duration:  2 hours Timing:  Constant Progression:  Partially resolved Chronicity:  New Relieved by:  Nothing Worsened by:  Nothing Ineffective treatments:  None tried   Past Medical History:  Diagnosis Date  . Coronary artery disease    stent in the LAD artery in 2002  . HTN (hypertension) 08/30/2012  . Hyperlipidemia 08/30/2012  . ICD (implantable cardiac defibrillator) in place   . Macular degeneration   . Pacemaker 08/29/2012   st jude accent DR RF device, model number [id number], serial number [id number], implanted 08/29/2012 for sinus bradycardia, runs of supraventricular tachycardia  last checked 10/23/2012  . Polyneuropathy in other diseases classified elsewhere (HCC)   . SSS (sick sinus syndrome) with PAT and marked bradycardia with 3 sec pauses  08/30/2012  . Status post placement of cardiac pacemaker. 08/29/12 St. Jude device 08/30/2012  . Venous insufficiency     Patient Active Problem List   Diagnosis Date Noted  . Urinary frequency 08/10/2015  . Postoperative anemia due to acute blood  loss 07/27/2015  . SVT (supraventricular tachycardia) (HCC) 07/26/2015  . Femur fracture, left (HCC) 07/24/2015  . Chronic kidney disease 07/24/2015  . Periprosthetic fracture around internal prosthetic left hip joint (HCC) 07/24/2015  . Fall at home   . DVT (deep venous thrombosis) (HCC) 01/30/2014  . Occult blood positive stool 01/30/2014  . Thrombocytopenia, unspecified (HCC) 01/06/2014  . Anemia, iron deficiency 01/06/2014  . Hip fracture, left (HCC) 01/03/2014  . QT prolongation 01/03/2014  . Atherosclerotic cardiovascular disease 08/13/2013  . Hypertensive emergency 04/08/2013  . Dizzy 04/08/2013  . PAT (paroxysmal atrial tachycardia) (HCC) 12/25/2012  . SSS (sick sinus syndrome) with PAT and marked bradycardia with 3 sec pauses  08/30/2012  . Status post placement of cardiac pacemaker. 08/29/12 St. Jude device 08/30/2012  . HTN (hypertension) 08/30/2012  . Hyperlipidemia 08/30/2012  . Osteoporosis, unspecified 04/19/2012  . Essential and other specified forms of tremor 04/19/2012  . Essential hypertension 04/19/2012  . Cardiac dysrhythmia, unspecified 04/19/2012  . Chronic ischemic heart disease 04/19/2012  . Hereditary and idiopathic peripheral neuropathy 04/19/2012  . Memory loss 04/19/2012  . Disturbance of skin sensation 04/19/2012  . Pain in joint, shoulder region 11/20/2011  . Status post complete repair of rotator cuff 11/20/2011  . Muscle weakness (generalized) 11/20/2011    Past Surgical History:  Procedure Laterality Date  . ABDOMINAL HYSTERECTOMY    . CARDIAC CATHETERIZATION  2006   3 stents placed  . CARDIAC CATHETERIZATION  06/2001   placement of BiodivYsio 2.5.10mm stent dilated to 2.7575mm in proximal left anterior descenting stenotic lesion  .  CESAREAN SECTION    . COLONOSCOPY W/ POLYPECTOMY    . HIP ARTHROPLASTY Left 01/04/2014   Procedure: ARTHROPLASTY BIPOLAR HIP;  Surgeon: Shelda Pal, MD;  Location: [facility];  Service: Orthopedics;  Laterality: Left;    . INSERT / REPLACE / REMOVE PACEMAKER  08/29/2012   st jude   . KIDNEY STONE SURGERY    . OPEN REDUCTION INTERNAL FIXATION (ORIF) DISTAL RADIAL FRACTURE Right 04/01/2016   Procedure: OPEN REDUCTION INTERNAL FIXATION (ORIF) RIGHT DISTAL RADIAL FRACTURE AND REPAIR;  Surgeon: Bradly Bienenstock, MD;  Location: [facility] OR;  Service: Orthopedics;  Laterality: Right;  . PERMANENT PACEMAKER INSERTION N/A 08/29/2012   Procedure: PERMANENT PACEMAKER INSERTION;  Surgeon: Thurmon Fair, MD;  Location: [facility] CATH LAB;  Service: Cardiovascular;  Laterality: N/A;  . RADIOFREQUENCY ABLATION  08/25/2010   ablation of left greater saphenous vein  . ROTATOR CUFF REPAIR Left   . ROTATOR CUFF REPAIR Right   . TOTAL HIP REVISION Left 07/24/2015   Procedure: ORIF LEFT PERIPROSTHETIC FEMUR FRACTURE; CONVERSION TO TOTAL HIP ARTHROPLASTY POSS. FEMORAL COMPONENT REVISION ;  Surgeon: Samson Frederic, MD;  Location: [facility] OR;  Service: Orthopedics;  Laterality: Left;    OB History    No data available       Home Medications    Prior to Admission medications   Medication Sig Start Date End Date Taking? Authorizing Provider  acetaminophen (TYLENOL) 500 MG tablet Take 500 mg by mouth every 6 (six) hours as needed for mild pain or moderate pain.     Historical Provider, MD  aspirin EC 81 MG tablet Take 81 mg by mouth every morning.     Historical Provider, MD  carvedilol (COREG) 6.25 MG tablet Take 1 tablet (6.25 mg total) by mouth 2 (two) times daily. 07/06/16   Marinus Maw, MD  Cholecalciferol (VITAMIN D-3 PO) Take 1 capsule by mouth daily.    Historical Provider, MD  docusate sodium (COLACE) 100 MG capsule Take 1 capsule (100 mg total) by mouth 2 (two) times daily. 04/01/16   Bradly Bienenstock, MD  furosemide (LASIX) 20 MG tablet TAKE ONE TABLET BY MOUTH DAILY AS NEEDEDFOR EDEMA 01/22/15   Marinus Maw, MD  Multiple Vitamins-Minerals (OCUVITE PO) Take 1 tablet by mouth 2 (two) times daily.    Historical Provider, MD  nitroGLYCERIN  (NITROSTAT) 0.4 MG SL tablet Place 1 tablet (0.4 mg total) under the tongue every 5 (five) minutes as needed for chest pain. 06/01/15   Marinus Maw, MD  Polyethyl Glycol-Propyl Glycol (SYSTANE OP) Apply 1 drop to eye daily as needed (Dry Eyes).     Historical Provider, MD  ramipril (ALTACE) 2.5 MG capsule Take 1 capsule (2.5 mg total) by mouth daily. 06/30/16   Marinus Maw, MD  vitamin B-12 (CYANOCOBALAMIN) 1000 MCG tablet Take 1,000 mcg by mouth daily.    Historical Provider, MD  vitamin C (ASCORBIC ACID) 500 MG tablet Take 1 tablet (500 mg total) by mouth daily. 04/01/16   Bradly Bienenstock, MD    Family History Family History  Problem Relation Age of Onset  . Heart disease Mother   . Congestive Heart Failure Mother   . Heart disease Father   . Heart attack Father   . Heart disease Brother     Social History Social History  Substance Use Topics  . Smoking status: Never Smoker  . Smokeless tobacco: Never Used  . Alcohol use No     Allergies   Gabapentin   Review  of Systems Review of Systems  Neurological: Positive for dizziness.  All other systems reviewed and are negative.    Physical Exam Updated Vital Signs BP (S) (!) 198/121 (BP Location: Left Arm)   Pulse 89   Temp 98.3 F (36.8 C) (Oral)   Resp 15   Ht 5\' 7"  (1.702 m)   Wt 138 lb (62.6 kg)   SpO2 96%   BMI 21.61 kg/m   Physical Exam  Constitutional: She is oriented to person, place, and time. She appears well-developed and well-nourished. No distress.  HENT:  Head: Normocephalic and atraumatic.  Eyes: EOM are normal. Pupils are equal, round, and reactive to light.  Neck: Normal range of motion. Neck supple.  Cardiovascular: Normal rate and regular rhythm.  Exam reveals no gallop and no friction rub.   No murmur heard. Pulmonary/Chest: Effort normal and breath sounds normal. No respiratory distress. She has no wheezes.  Abdominal: Soft. Bowel sounds are normal. She exhibits no distension. There is no  tenderness.  Musculoskeletal: Normal range of motion.  Neurological: She is alert and oriented to person, place, and time. No cranial nerve deficit. She exhibits normal muscle tone. Coordination normal.  Skin: Skin is warm and dry. She is not diaphoretic.  Nursing note and vitals reviewed.    ED Treatments / Results  Labs (all labs ordered are listed, but only abnormal results are displayed) Labs Reviewed  BASIC METABOLIC PANEL  CBC  URINALYSIS, ROUTINE W REFLEX MICROSCOPIC  TROPONIN I  CBG MONITORING, ED    EKG  EKG Interpretation None       Radiology No results found.  Procedures Procedures (including critical care time)  Medications Ordered in ED Medications - No data to display   Initial Impression / Assessment and Plan / ED Course  I have reviewed the triage vital signs and the nursing notes.  Pertinent labs & imaging results that were available during my care of the patient were reviewed by me and considered in my medical decision making (see chart for details).  Patient presents for evaluation of dizziness. This began while she was in physical therapy. She was found have a blood pressure of 200 systolic and was brought here for evaluation of this.  Her neurologic exam is nonfocal. Workup reveals no obvious abnormality. She does have what appears to be a lacunar infarct which is new compared with prior studies from 4 years ago, however no other evidence for stroke.  She was given clonidine with good results. Her blood pressure is now 170 systolic and I believe this is acceptable for discharge. She is to keep a record of her blood pressures and take this with her to her next doctor's appointment to discuss.  Final Clinical Impressions(s) / ED Diagnoses   Final diagnoses:  None    New Prescriptions New Prescriptions   No medications on file     Geoffery Lyons, MD 09/21/16 1418

## 2016-09-21 NOTE — ED Triage Notes (Addendum)
Patient sent from Ambulatory rehab -there for balance therapy due to hip replacements. Patient states approx. 1030 she felt "dizzy" and still feels dizzy. Patient was hypertensive at therapy per family. Reports of generalized fatigue.  A&Ox4. No deficits noted. Denies pain. PAtient reports of eating a good breakfast this morning.

## 2016-09-21 NOTE — Therapy (Addendum)
PHYSICAL THERAPY DISCHARGE SUMMARY  Visits from Start of Care: 6  Current functional level related to goals / functional outcomes: *see below   Remaining deficits: *see below   Education / Equipment: **see below**  Plan: Patient agrees to discharge.  Patient goals were not met. Patient is being discharged due to not returning since the last visit.  ?????           7:34 AM, 03/05/17 Etta Grandchild, PT, DPT Physical Therapist - [place] [phone] (Office)        [place], [place], [address] Phone: [phone]   Fax:  [phone]  Physical Therapy Treatment  Patient Details  Name: Pamela [name] MRN: [id number] Date of Birth: [birth year] Referring Provider: Meta Hatchet   Encounter Date: 09/21/2016      PT End of Session - 09/21/16 1052    Visit Number 6   Number of Visits 16   Date for PT Re-Evaluation 09/28/16   Authorization Type BCBS Medicare   Authorization Time Period 08/31/16-10/29/16   Authorization - Visit Number 6   Authorization - Number of Visits 10   PT Start Time 4287   PT Stop Time 1125   PT Time Calculation (min) 45 min   Activity Tolerance Patient tolerated treatment well;No increased pain   Behavior During Therapy WFL for tasks assessed/performed      Past Medical History:  Diagnosis Date  . Coronary artery disease    stent in the LAD artery in 2002  . HTN (hypertension) 08/30/2012  . Hyperlipidemia 08/30/2012  . ICD (implantable cardiac defibrillator) in place   . Macular degeneration   . Pacemaker 08/29/2012   st jude accent DR RF device, model number [id number], serial number [id number], implanted 08/29/2012 for sinus bradycardia, runs of supraventricular tachycardia  last checked 10/23/2012  . Polyneuropathy in other diseases classified elsewhere ([place])   . SSS (sick sinus syndrome) with PAT and marked bradycardia with 3 sec pauses   08/30/2012  . Status post placement of cardiac pacemaker. 08/29/12 St. Jude device 08/30/2012  . Venous insufficiency     Past Surgical History:  Procedure Laterality Date  . ABDOMINAL HYSTERECTOMY    . CARDIAC CATHETERIZATION  2006   3 stents placed  . CARDIAC CATHETERIZATION  06/2001   placement of BiodivYsio 2.5.10mm stent dilated to 2.82m in proximal left anterior descenting stenotic lesion  . CESAREAN SECTION    . COLONOSCOPY W/ POLYPECTOMY    . HIP ARTHROPLASTY Left 01/04/2014   Procedure: ARTHROPLASTY BIPOLAR HIP;  Surgeon: MMauri Pole MD;  [facility]  Service: Orthopedics;  Laterality: Left;  . INSERT / REPLACE / REMOVE PACEMAKER  08/29/2012   st jude   . KIDNEY STONE SURGERY    . OPEN REDUCTION INTERNAL FIXATION (ORIF) DISTAL RADIAL FRACTURE Right 04/01/2016   Procedure: OPEN REDUCTION INTERNAL FIXATION (ORIF) RIGHT DISTAL RADIAL FRACTURE AND REPAIR;  Surgeon: FIran Planas MD;  [facility]  Service: Orthopedics;  Laterality: Right;  . PERMANENT PACEMAKER INSERTION N/A 08/29/2012   Procedure: PERMANENT PACEMAKER INSERTION;  Surgeon: MSanda Klein MD;  [facility];  Service: Cardiovascular;  Laterality: N/A;  . RADIOFREQUENCY ABLATION  08/25/2010   ablation of left greater saphenous vein  . ROTATOR CUFF REPAIR Left   . ROTATOR CUFF REPAIR Right   . TOTAL HIP REVISION Left 07/24/2015   Procedure: ORIF LEFT PERIPROSTHETIC FEMUR FRACTURE; CONVERSION TO TOTAL HIP ARTHROPLASTY POSS.  FEMORAL COMPONENT REVISION ;  Surgeon: Rod Can, MD;  Location: [facility];  Service: Orthopedics;  Laterality: Left;    Vitals:   09/21/16 1234  BP: (!) 220/99  Pulse: 76        Subjective Assessment - 09/21/16 1045    Subjective Pt doing well. She continues to work on HEP at home, even though she feel a bit sore from time to time. She thinks she is making improvements overall.    Currently in Pain? No/denies                         [facility] Adult PT  Treatment/Exercise - 09/21/16 0001      Knee/Hip Exercises: Standing   Heel Raises Both;2 sets;20 reps   Other Standing Knee Exercises Knee/hip flexion in stance with 1sec pause: 3x5 bilat      Knee/Hip Exercises: Seated   Sit to Sand 10 reps;without UE support;2 sets  airex foam on chair; VC for reduction of knee valgus bilat     Knee/Hip Exercises: Supine   Bridges Both;10 reps  3x10   Other Supine Knee/Hip Exercises Supine bent knee raise: 3x10 bilat                  PT Short Term Goals - 09/05/16 1117      PT SHORT TERM GOAL #1   Title After 4 weeks patient will demonstrate improved balance AEB DGI score 15/24.    Baseline 10/24    Status On-going     PT SHORT TERM GOAL #2   Title After 4 weeks patient will demonstrate improved gait speed, both self selected (>0.61ms) and maximal (0.99m)    Status On-going     PT SHORT TERM GOAL #3   Title After 4 weeks patient will demosntrate improved power output AEB 5xSTS in less than 20 seconds.    Status On-going           PT Long Term Goals - 09/05/16 1117      PT LONG TERM GOAL #1   Title After 8 weeks patient will demosntrate improved power output AEB 5xSTS in less than 16 seconds.    Status On-going     PT LONG TERM GOAL #2   Title After 8 weeks patient will demonstrate improved gait speed, both self selected (>0.8068m and maximal (1.27m67m   Status On-going     PT LONG TERM GOAL #3   Title After 8 weeks patient will demonstrate improved balance AEB DGI score 19/24.    Status On-going               Plan - 09/21/16 1054    Clinical Impression Statement Pt tolerating session well today, with noted increased tolerance to therex, however continued weakness in hip extensors. BridConstance Haw performed starting with pelvis on a airex pad to assist with end range strength, however knee flexion angle is decreased to prevent additional cramping in the R hamstrings. Patient does endorse soem lightheadedness  coming from supine to sitting, and takes several minutes to allow resolution, demonstrating some good safety awareness. She reports she does not typically have problems with postural dizziness. Unable to continue with advanced gait and balance training after strengthening as patient continued to feel lighteaded and fatigued. BP assessed after minutes rest/sitting: 220/99mm7m PT contacted PCP who recommended the patient go straight to the ED. PT communicated with daughter-in-law in waiting area who was agreeable to provide transportation to the  ED.    Rehab Potential Good   PT Frequency 2x / week   PT Duration 8 weeks   PT Treatment/Interventions ADLs/Self Care Home Management;Electrical Stimulation;Gait training;Stair training;Functional mobility training;Therapeutic activities;Therapeutic exercise;Balance training;Patient/family education;Passive range of motion;Manual techniques;Dry needling   PT Next Visit Plan continue with advanced balance and stability while progressing power training (quick controlled movements).    PT Home Exercise Plan At eval: bridging, semi tandem stance, sit to stand transfers; Pt to work on gait concentrating on achieving a step-through gait.       Patient will benefit from skilled therapeutic intervention in order to improve the following deficits and impairments:  Abnormal gait, Decreased coordination, Decreased range of motion, Difficulty walking, Decreased endurance, Increased muscle spasms, Decreased activity tolerance, Decreased balance, Decreased mobility, Decreased strength, Hypomobility, Impaired flexibility, Improper body mechanics, Postural dysfunction  Visit Diagnosis: Repeated falls  Unsteadiness on feet  Difficulty in walking, not elsewhere classified     Problem List Patient Active Problem List   Diagnosis Date Noted  . Urinary frequency 08/10/2015  . Postoperative anemia due to acute blood loss 07/27/2015  . SVT (supraventricular tachycardia)  ([place]) 07/26/2015  . Femur fracture, left ([place]) 07/24/2015  . Chronic kidney disease 07/24/2015  . Periprosthetic fracture around internal prosthetic left hip joint ([place]) 07/24/2015  . Fall at home   . DVT (deep venous thrombosis) ([place]) 01/30/2014  . Occult blood positive stool 01/30/2014  . Thrombocytopenia, unspecified ([place]) 01/06/2014  . Anemia, iron deficiency 01/06/2014  . Hip fracture, left ([place]) 01/03/2014  . QT prolongation 01/03/2014  . Atherosclerotic cardiovascular disease 08/13/2013  . Hypertensive emergency 04/08/2013  . Dizzy 04/08/2013  . PAT (paroxysmal atrial tachycardia) ([place]) 12/25/2012  . SSS (sick sinus syndrome) with PAT and marked bradycardia with 3 sec pauses  08/30/2012  . Status post placement of cardiac pacemaker. 08/29/12 St. Jude device 08/30/2012  . HTN (hypertension) 08/30/2012  . Hyperlipidemia 08/30/2012  . Osteoporosis, unspecified 04/19/2012  . Essential and other specified forms of tremor 04/19/2012  . Essential hypertension 04/19/2012  . Cardiac dysrhythmia, unspecified 04/19/2012  . Chronic ischemic heart disease 04/19/2012  . Hereditary and idiopathic peripheral neuropathy 04/19/2012  . Memory loss 04/19/2012  . Disturbance of skin sensation 04/19/2012  . Pain in joint, shoulder region 11/20/2011  . Status post complete repair of rotator cuff 11/20/2011  . Muscle weakness (generalized) 11/20/2011    12:39 PM, 09/21/16 Etta Grandchild, PT, DPT Physical Therapist at [place] [phone] (office)     [place] [address] [place], [place], [address] Phone: [phone]   Fax:  [phone]  Name: HERO MCCATHERN MRN: [id number] Date of Birth: 1920/09/09

## 2016-09-21 NOTE — ED Notes (Signed)
Dr. Hyacinth MeekerMiller made aware of patient.

## 2016-09-21 NOTE — Discharge Instructions (Signed)
Clonidine as prescribed as needed for elevated blood pressure.  Keep a record of your blood pressures and take this with you to your next doctor's appointment so you can discuss medication adjustments.  Return to the emergency department if you develop severe headache, persistent blood pressures greater than 200, high fevers, or other new and concerning symptoms.

## 2016-09-22 DIAGNOSIS — I495 Sick sinus syndrome: Secondary | ICD-10-CM | POA: Diagnosis not present

## 2016-09-22 DIAGNOSIS — Z6824 Body mass index (BMI) 24.0-24.9, adult: Secondary | ICD-10-CM | POA: Diagnosis not present

## 2016-09-22 DIAGNOSIS — E782 Mixed hyperlipidemia: Secondary | ICD-10-CM | POA: Diagnosis not present

## 2016-09-22 DIAGNOSIS — Z950 Presence of cardiac pacemaker: Secondary | ICD-10-CM | POA: Diagnosis not present

## 2016-09-25 ENCOUNTER — Telehealth (HOSPITAL_COMMUNITY): Payer: Self-pay | Admitting: Physical Therapy

## 2016-09-25 ENCOUNTER — Telehealth (HOSPITAL_COMMUNITY): Payer: Self-pay | Admitting: Internal Medicine

## 2016-09-25 NOTE — Telephone Encounter (Signed)
Patient is still not feeling well.

## 2016-09-25 NOTE — Telephone Encounter (Signed)
09/25/16  Caller said that patient's blood pressure is still a little high and she will be taking it easy this week.

## 2016-09-26 ENCOUNTER — Encounter (HOSPITAL_COMMUNITY): Payer: Self-pay | Admitting: Emergency Medicine

## 2016-09-26 ENCOUNTER — Emergency Department (HOSPITAL_COMMUNITY): Payer: Medicare Other

## 2016-09-26 ENCOUNTER — Ambulatory Visit (HOSPITAL_COMMUNITY): Payer: Medicare Other | Admitting: Physical Therapy

## 2016-09-26 ENCOUNTER — Emergency Department (HOSPITAL_COMMUNITY)
Admission: EM | Admit: 2016-09-26 | Discharge: 2016-09-26 | Disposition: A | Discharge status: Home / Self Care | Payer: Medicare Other | Attending: Emergency Medicine | Admitting: Emergency Medicine

## 2016-09-26 DIAGNOSIS — R51 Headache: Secondary | ICD-10-CM | POA: Insufficient documentation

## 2016-09-26 DIAGNOSIS — I10 Essential (primary) hypertension: Secondary | ICD-10-CM | POA: Diagnosis not present

## 2016-09-26 DIAGNOSIS — Z950 Presence of cardiac pacemaker: Secondary | ICD-10-CM | POA: Insufficient documentation

## 2016-09-26 DIAGNOSIS — Z79899 Other long term (current) drug therapy: Secondary | ICD-10-CM | POA: Insufficient documentation

## 2016-09-26 DIAGNOSIS — N189 Chronic kidney disease, unspecified: Secondary | ICD-10-CM | POA: Diagnosis not present

## 2016-09-26 DIAGNOSIS — Z7982 Long term (current) use of aspirin: Secondary | ICD-10-CM | POA: Insufficient documentation

## 2016-09-26 DIAGNOSIS — I129 Hypertensive chronic kidney disease with stage 1 through stage 4 chronic kidney disease, or unspecified chronic kidney disease: Secondary | ICD-10-CM | POA: Diagnosis not present

## 2016-09-26 DIAGNOSIS — R519 Headache, unspecified: Secondary | ICD-10-CM

## 2016-09-26 DIAGNOSIS — R42 Dizziness and giddiness: Secondary | ICD-10-CM | POA: Diagnosis not present

## 2016-09-26 DIAGNOSIS — I2510 Atherosclerotic heart disease of native coronary artery without angina pectoris: Secondary | ICD-10-CM | POA: Diagnosis not present

## 2016-09-26 LAB — I-STAT CHEM 8, ED
BUN: 17 mg/dL (ref 6–20)
CALCIUM ION: 1.27 mmol/L (ref 1.15–1.40)
CREATININE: 0.7 mg/dL (ref 0.44–1.00)
Chloride: 104 mmol/L (ref 101–111)
Glucose, Bld: 98 mg/dL (ref 65–99)
HCT: 36 % (ref 36.0–46.0)
Hemoglobin: 12.2 g/dL (ref 12.0–15.0)
Potassium: 4.1 mmol/L (ref 3.5–5.1)
SODIUM: 142 mmol/L (ref 135–145)
TCO2: 30 mmol/L (ref 0–100)

## 2016-09-26 LAB — I-STAT TROPONIN, ED: Troponin i, poc: 0 ng/mL (ref 0.00–0.08)

## 2016-09-26 LAB — D-DIMER, QUANTITATIVE: D-Dimer, Quant: 1.18 ug{FEU}/mL — ABNORMAL HIGH (ref 0.00–0.50)

## 2016-09-26 MED ORDER — IOPAMIDOL (ISOVUE-370) INJECTION 76%
INTRAVENOUS | Status: AC
  Administered 2016-09-26: 100 mL
  Filled 2016-09-26: qty 100

## 2016-09-26 NOTE — ED Notes (Signed)
Both ears irrigated per Dr. Olin PiaPlunketts request. Large amounts of wax irrigated out of each ear.

## 2016-09-26 NOTE — ED Provider Notes (Signed)
[facility]-EMERGENCY DEPT Provider Note   CSN: [id number] Arrival date & time: 09/26/16  1409     History   Chief Complaint Chief Complaint  Patient presents with  . Hypertension    HPI Pamela [name] is a 83 y.o. female.  Patient is a 83 year old female presenting today with persistent hypertension and dizziness. Patient states 7 days ago when she was at therapy she felt dizzy and at that time he checked her blood pressure and it was elevated at 200 systolic over 90s. Patient states at that time she felt generally weak and tired. She denied any unilateral symptoms, vision changes, chest pain or shortness of breath. Patient was seen in the emergency room where she had normal blood work was given medication for her blood pressure and had a CT that showed a lacunar infarct which was presumed to be old. Patient has followed up with her PCP and has started taking the increased dose of blood pressure medication however her blood pressure has not significantly improved. She is still complaining of dizziness which she describes as feeling fuzzy. She denies any feelings of near syncope and symptoms are present at rest and with activity. She denies any unilateral weakness but feels generally tired and weak. She has had no speech difficulty and has been eating and drinking normally. She has been ambulating with a walker and has had no difficulty getting around her home.  She saw her PCP today who recommended, a back for reevaluation as her blood pressure continues to be elevated.  She denies any new excessive salt intake and has been compliant with medications. At home her blood pressure ranges from 140 systolic to 200 and that's with the addition of increased blood pressure control.   The history is provided by the patient and a relative.  Hypertension  This is a new problem. The problem occurs constantly. The problem has not changed since onset.Associated symptoms include headaches. Pertinent negatives  include no chest pain and no shortness of breath. Associated symptoms comments: Dizziness which she describes as feeling fuzzy. Nothing aggravates the symptoms. Nothing relieves the symptoms. Treatments tried: increasing BP meds without improvement.    Past Medical History:  Diagnosis Date  . Coronary artery disease    stent in the LAD artery in 2002  . HTN (hypertension) 08/30/2012  . Hyperlipidemia 08/30/2012  . ICD (implantable cardiac defibrillator) in place   . Macular degeneration   . Pacemaker 08/29/2012   st jude accent DR RF device, model number [id number], serial number [id number], implanted 08/29/2012 for sinus bradycardia, runs of supraventricular tachycardia  last checked 10/23/2012  . Polyneuropathy in other diseases classified elsewhere (HCC)   . SSS (sick sinus syndrome) with PAT and marked bradycardia with 3 sec pauses  08/30/2012  . Status post placement of cardiac pacemaker. 08/29/12 St. Jude device 08/30/2012  . Venous insufficiency     Patient Active Problem List   Diagnosis Date Noted  . Urinary frequency 08/10/2015  . Postoperative anemia due to acute blood loss 07/27/2015  . SVT (supraventricular tachycardia) (HCC) 07/26/2015  . Femur fracture, left (HCC) 07/24/2015  . Chronic kidney disease 07/24/2015  . Periprosthetic fracture around internal prosthetic left hip joint (HCC) 07/24/2015  . Fall at home   . DVT (deep venous thrombosis) (HCC) 01/30/2014  . Occult blood positive stool 01/30/2014  . Thrombocytopenia, unspecified (HCC) 01/06/2014  . Anemia, iron deficiency 01/06/2014  . Hip fracture, left (HCC) 01/03/2014  . QT prolongation 01/03/2014  . Atherosclerotic cardiovascular disease  08/13/2013  . Hypertensive emergency 04/08/2013  . Dizzy 04/08/2013  . PAT (paroxysmal atrial tachycardia) (HCC) 12/25/2012  . SSS (sick sinus syndrome) with PAT and marked bradycardia with 3 sec pauses  08/30/2012  . Status post placement of cardiac pacemaker. 08/29/12 St. Jude  device 08/30/2012  . HTN (hypertension) 08/30/2012  . Hyperlipidemia 08/30/2012  . Osteoporosis, unspecified 04/19/2012  . Essential and other specified forms of tremor 04/19/2012  . Essential hypertension 04/19/2012  . Cardiac dysrhythmia, unspecified 04/19/2012  . Chronic ischemic heart disease 04/19/2012  . Hereditary and idiopathic peripheral neuropathy 04/19/2012  . Memory loss 04/19/2012  . Disturbance of skin sensation 04/19/2012  . Pain in joint, shoulder region 11/20/2011  . Status post complete repair of rotator cuff 11/20/2011  . Muscle weakness (generalized) 11/20/2011    Past Surgical History:  Procedure Laterality Date  . ABDOMINAL HYSTERECTOMY    . CARDIAC CATHETERIZATION  2006   3 stents placed  . CARDIAC CATHETERIZATION  06/2001   placement of BiodivYsio 2.5.10mm stent dilated to 2.89mm in proximal left anterior descenting stenotic lesion  . CESAREAN SECTION    . COLONOSCOPY W/ POLYPECTOMY    . HIP ARTHROPLASTY Left 01/04/2014   Procedure: ARTHROPLASTY BIPOLAR HIP;  Surgeon: Shelda Pal, MD;  Location: [facility] OR;  Service: Orthopedics;  Laterality: Left;  . INSERT / REPLACE / REMOVE PACEMAKER  08/29/2012   st jude   . KIDNEY STONE SURGERY    . OPEN REDUCTION INTERNAL FIXATION (ORIF) DISTAL RADIAL FRACTURE Right 04/01/2016   Procedure: OPEN REDUCTION INTERNAL FIXATION (ORIF) RIGHT DISTAL RADIAL FRACTURE AND REPAIR;  Surgeon: Bradly Bienenstock, MD;  Location: [facility] OR;  Service: Orthopedics;  Laterality: Right;  . PERMANENT PACEMAKER INSERTION N/A 08/29/2012   Procedure: PERMANENT PACEMAKER INSERTION;  Surgeon: Thurmon Fair, MD;  Location: [facility] CATH LAB;  Service: Cardiovascular;  Laterality: N/A;  . RADIOFREQUENCY ABLATION  08/25/2010   ablation of left greater saphenous vein  . ROTATOR CUFF REPAIR Left   . ROTATOR CUFF REPAIR Right   . TOTAL HIP REVISION Left 07/24/2015   Procedure: ORIF LEFT PERIPROSTHETIC FEMUR FRACTURE; CONVERSION TO TOTAL HIP ARTHROPLASTY POSS. FEMORAL  COMPONENT REVISION ;  Surgeon: Samson Frederic, MD;  Location: [facility] OR;  Service: Orthopedics;  Laterality: Left;    OB History    No data available       Home Medications    Prior to Admission medications   Medication Sig Start Date End Date Taking? Authorizing Provider  acetaminophen (TYLENOL) 500 MG tablet Take 500 mg by mouth every 6 (six) hours as needed for mild pain or moderate pain.     Historical Provider, MD  aspirin EC 81 MG tablet Take 81 mg by mouth every morning.     Historical Provider, MD  carvedilol (COREG) 6.25 MG tablet Take 1 tablet (6.25 mg total) by mouth 2 (two) times daily. 07/06/16   Marinus Maw, MD  Cholecalciferol (VITAMIN D-3 PO) Take 1 capsule by mouth daily.    Historical Provider, MD  cloNIDine (CATAPRES) 0.1 MG tablet Take 1 tablet every 12 hours as needed for blood pressure over 180 systolic. 09/21/16   Geoffery Lyons, MD  docusate sodium (COLACE) 100 MG capsule Take 1 capsule (100 mg total) by mouth 2 (two) times daily. 04/01/16   Bradly Bienenstock, MD  furosemide (LASIX) 20 MG tablet TAKE ONE TABLET BY MOUTH DAILY AS NEEDEDFOR EDEMA 01/22/15   Marinus Maw, MD  lidocaine (LMX) 4 % cream Apply 1 application topically 2 (  two) times daily as needed.    Historical Provider, MD  Multiple Vitamins-Minerals (OCUVITE PO) Take 1 tablet by mouth 2 (two) times daily.    Historical Provider, MD  nitroGLYCERIN (NITROSTAT) 0.4 MG SL tablet Place 1 tablet (0.4 mg total) under the tongue every 5 (five) minutes as needed for chest pain. 06/01/15   Marinus Maw, MD  Polyethyl Glycol-Propyl Glycol (SYSTANE OP) Apply 1 drop to eye daily as needed (Dry Eyes).     Historical Provider, MD  ramipril (ALTACE) 2.5 MG capsule Take 1 capsule (2.5 mg total) by mouth daily. 06/30/16   Marinus Maw, MD  vitamin B-12 (CYANOCOBALAMIN) 1000 MCG tablet Take 1,000 mcg by mouth daily.    Historical Provider, MD    Family History Family History  Problem Relation Age of Onset  . Heart disease  Mother   . Congestive Heart Failure Mother   . Heart disease Father   . Heart attack Father   . Heart disease Brother     Social History Social History  Substance Use Topics  . Smoking status: Never Smoker  . Smokeless tobacco: Never Used  . Alcohol use No     Allergies   Gabapentin   Review of Systems Review of Systems  Respiratory: Negative for shortness of breath.   Cardiovascular: Negative for chest pain.  Neurological: Positive for headaches.  All other systems reviewed and are negative.    Physical Exam Updated Vital Signs BP 180/76 (BP Location: Right Arm)   Pulse 60   Temp 97.5 F (36.4 C) (Oral)   Resp 16   SpO2 100%   Physical Exam  Constitutional: She is oriented to person, place, and time. She appears well-developed and well-nourished. No distress.  HENT:  Head: Normocephalic and atraumatic.  Mouth/Throat: Oropharynx is clear and moist.  Bilateral TM's occluded by wax  Eyes: Conjunctivae and EOM are normal. Pupils are equal, round, and reactive to light.  Neck: Normal range of motion. Neck supple.  Cardiovascular: Normal rate, regular rhythm and intact distal pulses.   No murmur heard. Pace maker present in the left upper chest  Pulmonary/Chest: Effort normal and breath sounds normal. No respiratory distress. She has no wheezes. She has no rales.  Abdominal: Soft. She exhibits no distension. There is no tenderness. There is no rebound and no guarding.  Musculoskeletal: Normal range of motion. She exhibits no edema or tenderness.  No calf tenderness bilaterally  Neurological: She is alert and oriented to person, place, and time. She has normal strength. No sensory deficit. Coordination and gait normal.  No vertigo.  No visual field cuts  Skin: Skin is warm and dry. No rash noted. No erythema.  Psychiatric: She has a normal mood and affect. Her behavior is normal.  Nursing note and vitals reviewed.    ED Treatments / Results  Labs (all labs  ordered are listed, but only abnormal results are displayed) Labs Reviewed  D-DIMER, QUANTITATIVE (NOT AT [facility]) - Abnormal; Notable for the following:       Result Value   D-Dimer, Quant 1.18 (*)    All other components within normal limits  I-STAT CHEM 8, ED  I-STAT TROPOININ, ED    EKG ED ECG REPORT   Date: 09/26/2016  Rate: 60  Rhythm: atrial paced  QRS Axis: indeterminate  Intervals: normal  ST/T Wave abnormalities: indeterminate  Conduction Disutrbances:atrial paced  Narrative Interpretation:   Old EKG Reviewed: unchanged  I have personally reviewed the EKG tracing and agree  with the computerized printout as noted.   Radiology Ct Angio Head W Or Wo Contrast  Result Date: 09/26/2016 CLINICAL DATA:  Dizziness, headache and hypertension EXAM: CT ANGIOGRAPHY HEAD AND NECK TECHNIQUE: Multidetector CT imaging of the head and neck was performed using the standard protocol during bolus administration of intravenous contrast. Multiplanar CT image reconstructions and MIPs were obtained to evaluate the vascular anatomy. Carotid stenosis measurements (when applicable) are obtained utilizing NASCET criteria, using the distal internal carotid diameter as the denominator. CONTRAST:  100 mL Isovue 370 IV COMPARISON:  Head CT 09/21/2016 FINDINGS: CT HEAD FINDINGS Brain: No mass lesion, intraparenchymal hemorrhage or extra-axial collection. No evidence of acute cortical infarct. There is periventricular hypoattenuation compatible with chronic microvascular disease. Vascular: Atherosclerotic calcification of the internal carotid arteries at the skull base. Skull: Normal visualized skull base, calvarium and extracranial soft tissues. Sinuses/Orbits: No sinus fluid levels or advanced mucosal thickening. No mastoid effusion. Normal orbits. CTA NECK FINDINGS Aortic arch: There is no aneurysm or dissection of the visualized ascending aorta or aortic arch. There is a normal 3 vessel branching pattern. The  visualized proximal subclavian arteries are normal. Right carotid system: The right common carotid origin is widely patent. There is no common carotid or internal carotid artery dissection or aneurysm. No hemodynamically significant stenosis. Left carotid system: The left common carotid origin is widely patent. There is no common carotid or internal carotid artery dissection or aneurysm. No hemodynamically significant stenosis. Vertebral arteries: The vertebral system is left dominant. Both vertebral artery origins are normal. Both vertebral arteries are normal to their confluence with the basilar artery. Skeleton: There is no bony spinal canal stenosis. No lytic or blastic lesions. Other neck: The nasopharynx is clear. The oropharynx and hypopharynx are normal. The epiglottis is normal. The supraglottic larynx, glottis and subglottic larynx are normal. No retropharyngeal collection. The parapharyngeal spaces are preserved. The parotid and submandibular glands are normal. No sialolithiasis or salivary ductal dilatation. The thyroid gland is normal. There is no cervical lymphadenopathy. Upper chest: No pneumothorax or pleural effusion. No nodules or masses. Review of the MIP images confirms the above findings CTA HEAD FINDINGS Anterior circulation: --Intracranial internal carotid arteries: Bilateral atherosclerotic calcification without stenosis. Otherwise normal. --Anterior cerebral arteries: Normal. --Middle cerebral arteries: Normal. --Posterior communicating arteries: Absent bilaterally. Posterior circulation: --Posterior cerebral arteries: Normal. --Superior cerebellar arteries: Normal. 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 --Anterior inferior cerebellar arteries: Normal. --Posterior inferior cerebellar arteries: Normal. Venous sinuses: As permitted by contrast timing, patent. Anatomic variants: Fenestration of the basilar artery. Delayed phase: No parenchymal contrast  enhancement. Review of the MIP images confirms the above findings IMPRESSION: 1. No intracranial arterial occlusion or high grade stenosis. 2. No aneurysm, dissection or stenosis of the cervical carotid or vertebral arteries. Electronically Signed   By: Deatra Robinson M.D.   On: 09/26/2016 21:45   Dg Chest 2 View  Result Date: 09/26/2016 CLINICAL DATA:  Dizziness and weakness.  Hypertension. EXAM: CHEST  2 VIEW COMPARISON:  Radiograph 07/23/2015 FINDINGS: Dual lead left-sided pacemaker in place. Unchanged heart size and mediastinal contours with borderline mild cardiomegaly and aortic tortuosity. There is aortic atherosclerosis. Mild biapical pleuroparenchymal scarring. No pulmonary edema, focal airspace disease, pleural fluid or pneumothorax. Vertebroplasty within thoracic spine compression fractures with scoliosis. IMPRESSION: 1. No acute abnormality. 2. Borderline mild cardiomegaly and aortic tortuosity, stable from prior. Mild aortic atherosclerosis. Electronically Signed   By: Rubye Oaks M.D.   On: 09/26/2016 18:17  Ct Angio Neck W And/or Wo Contrast  Result Date: 09/26/2016 CLINICAL DATA:  Dizziness, headache and hypertension EXAM: CT ANGIOGRAPHY HEAD AND NECK TECHNIQUE: Multidetector CT imaging of the head and neck was performed using the standard protocol during bolus administration of intravenous contrast. Multiplanar CT image reconstructions and MIPs were obtained to evaluate the vascular anatomy. Carotid stenosis measurements (when applicable) are obtained utilizing NASCET criteria, using the distal internal carotid diameter as the denominator. CONTRAST:  100 mL Isovue 370 IV COMPARISON:  Head CT 09/21/2016 FINDINGS: CT HEAD FINDINGS Brain: No mass lesion, intraparenchymal hemorrhage or extra-axial collection. No evidence of acute cortical infarct. There is periventricular hypoattenuation compatible with chronic microvascular disease. Vascular: Atherosclerotic calcification of the internal  carotid arteries at the skull base. Skull: Normal visualized skull base, calvarium and extracranial soft tissues. Sinuses/Orbits: No sinus fluid levels or advanced mucosal thickening. No mastoid effusion. Normal orbits. CTA NECK FINDINGS Aortic arch: There is no aneurysm or dissection of the visualized ascending aorta or aortic arch. There is a normal 3 vessel branching pattern. The visualized proximal subclavian arteries are normal. Right carotid system: The right common carotid origin is widely patent. There is no common carotid or internal carotid artery dissection or aneurysm. No hemodynamically significant stenosis. Left carotid system: The left common carotid origin is widely patent. There is no common carotid or internal carotid artery dissection or aneurysm. No hemodynamically significant stenosis. Vertebral arteries: The vertebral system is left dominant. Both vertebral artery origins are normal. Both vertebral arteries are normal to their confluence with the basilar artery. Skeleton: There is no bony spinal canal stenosis. No lytic or blastic lesions. Other neck: The nasopharynx is clear. The oropharynx and hypopharynx are normal. The epiglottis is normal. The supraglottic larynx, glottis and subglottic larynx are normal. No retropharyngeal collection. The parapharyngeal spaces are preserved. The parotid and submandibular glands are normal. No sialolithiasis or salivary ductal dilatation. The thyroid gland is normal. There is no cervical lymphadenopathy. Upper chest: No pneumothorax or pleural effusion. No nodules or masses. Review of the MIP images confirms the above findings CTA HEAD FINDINGS Anterior circulation: --Intracranial internal carotid arteries: Bilateral atherosclerotic calcification without stenosis. Otherwise normal. --Anterior cerebral arteries: Normal. --Middle cerebral arteries: Normal. --Posterior communicating arteries: Absent bilaterally. Posterior circulation: --Posterior cerebral  arteries: Normal. --Superior cerebellar arteries: Normal. 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 --Anterior inferior cerebellar arteries: Normal. --Posterior inferior cerebellar arteries: Normal. Venous sinuses: As permitted by contrast timing, patent. Anatomic variants: Fenestration of the basilar artery. Delayed phase: No parenchymal contrast enhancement. Review of the MIP images confirms the above findings IMPRESSION: 1. No intracranial arterial occlusion or high grade stenosis. 2. No aneurysm, dissection or stenosis of the cervical carotid or vertebral arteries. Electronically Signed   By: Deatra Robinson M.D.   On: 09/26/2016 21:45   Ct Angio Chest Pe W Or Wo Contrast  Result Date: 09/26/2016 CLINICAL DATA:  Dizziness and hypertension. EXAM: CT ANGIOGRAPHY CHEST WITH CONTRAST TECHNIQUE: Multidetector CT imaging of the chest was performed using the standard protocol during bolus administration of intravenous contrast. Multiplanar CT image reconstructions and MIPs were obtained to evaluate the vascular anatomy. CONTRAST:  100 cc Isovue 370 IV COMPARISON:  Radiographs earlier this day. FINDINGS: Cardiovascular: There are no filling defects within the pulmonary arteries to suggest pulmonary embolus. Coronary artery calcifications. Atherosclerosis of the thoracic aorta which is tortuous. Mild aneurysmal dilatation of the ascending aorta maximal dimension 4.0 cm. No periaortic stranding. Cannot assess for dissection  given phase of contrast. Mediastinum/Nodes: No mediastinal or hilar adenopathy. No pericardial fluid. Esophagus is patulous. Left-sided pacemaker. Lungs/Pleura: Biapical pleuroparenchymal scarring, left greater than right. Mild dependent atelectasis. No pulmonary edema. No focal airspace disease. Minimal perifissural thickening in both lungs. No pleural fluid. Upper Abdomen: No acute abnormality. Musculoskeletal: Scoliosis and degenerative change in the spine.  Vertebroplasty with Ing compression fractures of tentatively identified T8 and T9. No acute osseous abnormality. Review of the MIP images confirms the above findings. IMPRESSION: 1. No pulmonary embolus or acute abnormality. 2. Aortic atherosclerosis and tortuosity. Mild aneurysmal dilatation of the ascending aorta, maximal dimension 4.0 cm. Recommend annual imaging followup by CTA or MRA. This recommendation follows 2010 ACCF/AHA/AATS/ACR/ASA/SCA/SCAI/SIR/STS/SVM Guidelines for the Diagnosis and Management of Patients with Thoracic Aortic Disease. Circulation. 2010; 121: [id number] 3. Coronary artery calcifications. 4. Biapical pleuroparenchymal scarring. Electronically Signed   By: Rubye OaksMelanie  Ehinger M.D.   On: 09/26/2016 21:33    Procedures Procedures (including critical care time)  Medications Ordered in ED Medications - No data to display   Initial Impression / Assessment and Plan / ED Course  I have reviewed the triage vital signs and the nursing notes.  Pertinent labs & imaging results that were available during my care of the patient were reviewed by me and considered in my medical decision making (see chart for details).     Patient is an elderly female with persistent hypertension and complaint dizziness. When asked to describe her dizziness she is very vague. She denies near syncopal type symptoms and it seems to be present at rest and with activity. She denies any recent infections and denies unilateral leg pain or swelling. She has a nonfocal neurologic exam. She is able to ambulate with a walker. She has had increased doses of her blood pressure medication over the last 7 days the blood pressure continues to fluctuate. She's had her PCP today who sent her here. Patient states she's had no new symptoms today than she has in the last 7 days. She had blood work done 7 days ago when symptoms started which was within normal limits and had a CT with a presumed old lacunar infarct. Patient  cannot get an MRI due to having a pacemaker and defibrillator.  Patient's hypertension may be related to the potential for a recent stroke versus her hypertension causing her vague dizziness. She was not anemic based on lab work done 7 days ago. EKG today shows a paced rhythm. Troponin pending. Spoke with neurology recommended a CTA of the head and neck for further evaluation given patient cannot have an MRI.  Also patient could have the possibility for a PE as she recently underwent hip and femur surgery approximately 8 weeks ago but she denies chest pain or shortness of breath. She has no unilateral leg swelling. D-dimer pending  7:31 PM Dimer elevated CTA of chest also pending.  Pt given po doses of her home carvedilol.  10:43 PM Repeat pressure is 175/80.  Pt in NAD.  Imaging without acute findings.  Will need annual imaging for aortic aneurysm.  Will d/c home with ongoing management of BP.  She has been on new doses of meds for only 5 days.  Will have f/u with PCP for further management.  Final Clinical Impressions(s) / ED Diagnoses   Final diagnoses:  Hypertension, unspecified type  Acute intractable headache, unspecified headache type    New Prescriptions New Prescriptions   No medications on file     Gwyneth SproutWhitney Tyechia Allmendinger, MD 09/26/16  [id number]  

## 2016-09-26 NOTE — ED Notes (Signed)
Patient transported to X-ray 

## 2016-09-26 NOTE — ED Notes (Signed)
Patient transported to CT 

## 2016-09-26 NOTE — ED Triage Notes (Signed)
Pt from home with c/o on going HTN.  Pt was seen in ED approx 5 days ago and told to increase a BP by EDP.  Was seen at PCP and [facility]. Pt in NAD, A&O.

## 2016-09-28 ENCOUNTER — Ambulatory Visit (HOSPITAL_COMMUNITY): Payer: Medicare Other

## 2016-09-28 DIAGNOSIS — I10 Essential (primary) hypertension: Secondary | ICD-10-CM | POA: Diagnosis not present

## 2016-09-28 DIAGNOSIS — E782 Mixed hyperlipidemia: Secondary | ICD-10-CM | POA: Diagnosis not present

## 2016-09-28 DIAGNOSIS — Z6824 Body mass index (BMI) 24.0-24.9, adult: Secondary | ICD-10-CM | POA: Diagnosis not present

## 2016-09-29 ENCOUNTER — Ambulatory Visit: Payer: Medicare Other | Admitting: Adult Health

## 2016-09-29 NOTE — Progress Notes (Deleted)
Cardiology Office Note   Date:  09/29/2016   ID:  Pamela Sackreva N Willy, DOB February 12, 1921, MRN [id number]  PCP:  Cassell SmilesFUSCO,Bryley Chrisman J., MD  Cardiologist: Vonda Antiguaaylor/  Shabana Armentrout, NP   No chief complaint on file.     History of Present Illness: Pamela [name] is a 83 y.o. female who presents for ongoing assessment and management of symptomatic tachybrady syndrome with pauses. She is s/p PPM insertion. She has a history of frequent falls with hip fracture. On last visit with Dr. Ladona Ridgelaylor on 12\01/2016, her blood pressure was elevated at 174/80, therefore Dr. Ladona Ridgelaylor increase Coreg to 6.25 mg twice a day. Pacemaker was interrogated and functioning normally. The patient was not found to be a good candidate for systemic anticoagulation therapy due to frequent falls. She was continued on low-dose aspirin.    Past Medical History:  Diagnosis Date  . Coronary artery disease    stent in the LAD artery in 2002  . HTN (hypertension) 08/30/2012  . Hyperlipidemia 08/30/2012  . ICD (implantable cardiac defibrillator) in place   . Macular degeneration   . Pacemaker 08/29/2012   st jude accent DR RF device, model number [id number], serial number [id number], implanted 08/29/2012 for sinus bradycardia, runs of supraventricular tachycardia  last checked 10/23/2012  . Polyneuropathy in other diseases classified elsewhere (HCC)   . SSS (sick sinus syndrome) with PAT and marked bradycardia with 3 sec pauses  08/30/2012  . Status post placement of cardiac pacemaker. 08/29/12 St. Jude device 08/30/2012  . Venous insufficiency     Past Surgical History:  Procedure Laterality Date  . ABDOMINAL HYSTERECTOMY    . CARDIAC CATHETERIZATION  2006   3 stents placed  . CARDIAC CATHETERIZATION  06/2001   placement of BiodivYsio 2.5.10mm stent dilated to 2.8375mm in proximal left anterior descenting stenotic lesion  . CESAREAN SECTION    . COLONOSCOPY W/ POLYPECTOMY    . HIP ARTHROPLASTY Left 01/04/2014   Procedure: ARTHROPLASTY BIPOLAR  HIP;  Surgeon: Shelda PalMatthew D Olin, MD;  [facility];  Service: Orthopedics;  Laterality: Left;  . INSERT / REPLACE / REMOVE PACEMAKER  08/29/2012   st jude   . KIDNEY STONE SURGERY    . OPEN REDUCTION INTERNAL FIXATION (ORIF) DISTAL RADIAL FRACTURE Right 04/01/2016   Procedure: OPEN REDUCTION INTERNAL FIXATION (ORIF) RIGHT DISTAL RADIAL FRACTURE AND REPAIR;  Surgeon: Bradly BienenstockFred Ortmann, MD;  Location: [facility] OR;  Service: Orthopedics;  Laterality: Right;  . PERMANENT PACEMAKER INSERTION N/A 08/29/2012   Procedure: PERMANENT PACEMAKER INSERTION;  Surgeon: Thurmon FairMihai Croitoru, MD;  Location: [facility] CATH LAB;  Service: Cardiovascular;  Laterality: N/A;  . RADIOFREQUENCY ABLATION  08/25/2010   ablation of left greater saphenous vein  . ROTATOR CUFF REPAIR Left   . ROTATOR CUFF REPAIR Right   . TOTAL HIP REVISION Left 07/24/2015   Procedure: ORIF LEFT PERIPROSTHETIC FEMUR FRACTURE; CONVERSION TO TOTAL HIP ARTHROPLASTY POSS. FEMORAL COMPONENT REVISION ;  Surgeon: Samson FredericBrian Swinteck, MD;  [facility];  Service: Orthopedics;  Laterality: Left;     Current Outpatient Prescriptions  Medication Sig Dispense Refill  . acetaminophen (TYLENOL) 500 MG tablet Take 500 mg by mouth every 6 (six) hours as needed for mild pain or moderate pain.     Marland Kitchen. aspirin EC 81 MG tablet Take 81 mg by mouth every morning.     . carvedilol (COREG) 6.25 MG tablet Take 1 tablet (6.25 mg total) by mouth 2 (two) times daily. 180 tablet 3  . Cholecalciferol (VITAMIN D-3 PO) Take 1  capsule by mouth daily.    . cloNIDine (CATAPRES) 0.1 MG tablet Take 1 tablet every 12 hours as needed for blood pressure over 180 systolic. 12 tablet 0  . docusate sodium (COLACE) 100 MG capsule Take 1 capsule (100 mg total) by mouth 2 (two) times daily. 10 capsule 0  . furosemide (LASIX) 20 MG tablet TAKE ONE TABLET BY MOUTH DAILY AS NEEDEDFOR EDEMA 30 tablet 2  . lidocaine (LMX) 4 % cream Apply 1 application topically 2 (two) times daily as needed.    . Multiple  Vitamins-Minerals (OCUVITE PO) Take 1 tablet by mouth 2 (two) times daily.    . nitroGLYCERIN (NITROSTAT) 0.4 MG SL tablet Place 1 tablet (0.4 mg total) under the tongue every 5 (five) minutes as needed for chest pain. 100 tablet 3  . Polyethyl Glycol-Propyl Glycol (SYSTANE OP) Apply 1 drop to eye daily as needed (Dry Eyes).     . ramipril (ALTACE) 2.5 MG capsule Take 1 capsule (2.5 mg total) by mouth daily. 30 capsule 6  . vitamin B-12 (CYANOCOBALAMIN) 1000 MCG tablet Take 1,000 mcg by mouth daily.     No current facility-administered medications for this visit.     Allergies:   Gabapentin    Social History:  The patient  reports that she has never smoked. She has never used smokeless tobacco. She reports that she does not drink alcohol or use drugs.   Family History:  The patient's family history includes Congestive Heart Failure in her mother; Heart attack in her father; Heart disease in her brother, father, and mother.    ROS: All other systems are reviewed and negative. Unless otherwise mentioned in H&P    PHYSICAL EXAM: VS:  There were no vitals taken for this visit. , BMI There is no height or weight on file to calculate BMI. GEN: Well nourished, well developed, in no acute distress  HEENT: normal  Neck: no JVD, carotid bruits, or masses Cardiac: ***RRR; no murmurs, rubs, or gallops,no edema  Respiratory:  clear to auscultation bilaterally, normal work of breathing GI: soft, nontender, nondistended, + BS MS: no deformity or atrophy  Skin: warm and dry, no rash Neuro:  Strength and sensation are intact Psych: euthymic mood, full affect   EKG:  EKG {ACTION; IS/IS [id number]} ordered today. The ekg ordered today demonstrates ***   Recent Labs: 04/27/2016: ALT 12 09/21/2016: Platelets 143 09/26/2016: BUN 17; Creatinine, Ser 0.70; Hemoglobin 12.2; Potassium 4.1; Sodium 142    Lipid Panel    Component Value Date/Time   CHOL 158 04/09/2013 0618   TRIG 98 04/09/2013 0618    HDL 63 04/09/2013 0618   CHOLHDL 2.5 04/09/2013 0618   VLDL 20 04/09/2013 0618   LDLCALC 75 04/09/2013 0618      Wt Readings from Last 3 Encounters:  09/21/16 138 lb (62.6 kg)  07/06/16 136 lb (61.7 kg)  07/04/16 139 lb 9.6 oz (63.3 kg)      Other studies Reviewed: Additional studies/ records that were reviewed today include: ***. Review of the above records demonstrates: ***   ASSESSMENT AND PLAN:  1.  ***   Current medicines are reviewed at length with the patient today.    Labs/ tests ordered today include: *** No orders of the defined types were placed in this encounter.    Disposition:   FU with *** in {gen number [id number]} {TIME; UNITS DAY/WEEK/MONTH:19136}   Signed, Joni Reining, NP  09/29/2016 6:54 AM    [facility]  [address] [place], [place] [address] Phone: [phone]; Fax: [phone]

## 2016-10-03 ENCOUNTER — Telehealth: Payer: Self-pay | Admitting: Internal Medicine

## 2016-10-03 ENCOUNTER — Ambulatory Visit (HOSPITAL_COMMUNITY): Payer: Medicare Other | Attending: Adult Health

## 2016-10-03 VITALS — BP 188/93 | HR 61

## 2016-10-03 DIAGNOSIS — R296 Repeated falls: Secondary | ICD-10-CM | POA: Insufficient documentation

## 2016-10-03 DIAGNOSIS — R262 Difficulty in walking, not elsewhere classified: Secondary | ICD-10-CM | POA: Insufficient documentation

## 2016-10-03 DIAGNOSIS — R2681 Unsteadiness on feet: Secondary | ICD-10-CM | POA: Insufficient documentation

## 2016-10-03 MED ORDER — CARVEDILOL 12.5 MG PO TABS
12.5000 mg | ORAL_TABLET | Freq: Two times a day (BID) | ORAL | 3 refills | Status: DC
Start: 2016-10-03 — End: 2016-10-12

## 2016-10-03 NOTE — Telephone Encounter (Signed)
Pt made aware,escribed [facility]g 12[facility]5 [facility]

## 2016-10-03 NOTE — Therapy (Signed)
[facility] [address] [place], [place], [address] Phone: [phone]   Fax:  [phone]  Physical Therapy Treatment  Patient Details  Name: Pamela [name] MRN: [id number] Date of Birth: February 14, 1921 Referring Provider: Esmeralda Links   Encounter Date: 10/03/2016      PT End of Session - 10/03/16 1055    PT Start Time 1035   PT Stop Time 1051   PT Time Calculation (min) 16 min      Past Medical History:  Diagnosis Date  . Coronary artery disease    stent in the LAD artery in 2002  . HTN (hypertension) 08/30/2012  . Hyperlipidemia 08/30/2012  . ICD (implantable cardiac defibrillator) in place   . Macular degeneration   . Pacemaker 08/29/2012   st jude accent DR RF device, model number [id number], serial number [id number], implanted 08/29/2012 for sinus bradycardia, runs of supraventricular tachycardia  last checked 10/23/2012  . Polyneuropathy in other diseases classified elsewhere (HCC)   . SSS (sick sinus syndrome) with PAT and marked bradycardia with 3 sec pauses  08/30/2012  . Status post placement of cardiac pacemaker. 08/29/12 St. Jude device 08/30/2012  . Venous insufficiency     Past Surgical History:  Procedure Laterality Date  . ABDOMINAL HYSTERECTOMY    . CARDIAC CATHETERIZATION  2006   3 stents placed  . CARDIAC CATHETERIZATION  06/2001   placement of BiodivYsio 2.5.10mm stent dilated to 2.87mm in proximal left anterior descenting stenotic lesion  . CESAREAN SECTION    . COLONOSCOPY W/ POLYPECTOMY    . HIP ARTHROPLASTY Left 01/04/2014   Procedure: ARTHROPLASTY BIPOLAR HIP;  Surgeon: Shelda Pal, MD;  Location: [facility] OR;  Service: Orthopedics;  Laterality: Left;  . INSERT / REPLACE / REMOVE PACEMAKER  08/29/2012   st jude   . KIDNEY STONE SURGERY    . OPEN REDUCTION INTERNAL FIXATION (ORIF) DISTAL RADIAL FRACTURE Right 04/01/2016   Procedure: OPEN REDUCTION INTERNAL FIXATION (ORIF) RIGHT DISTAL RADIAL FRACTURE AND REPAIR;   Surgeon: Bradly Bienenstock, MD;  [facility] OR;  Service: Orthopedics;  Laterality: Right;  . PERMANENT PACEMAKER INSERTION N/A 08/29/2012   Procedure: PERMANENT PACEMAKER INSERTION;  Surgeon: Thurmon Fair, MD;  [facility]: [facility] CATH LAB;  Service: Cardiovascular;  Laterality: N/A;  . RADIOFREQUENCY ABLATION  08/25/2010   ablation of left greater saphenous vein  . ROTATOR CUFF REPAIR Left   . ROTATOR CUFF REPAIR Right   . TOTAL HIP REVISION Left 07/24/2015   Procedure: ORIF LEFT PERIPROSTHETIC FEMUR FRACTURE; CONVERSION TO TOTAL HIP ARTHROPLASTY POSS. FEMORAL COMPONENT REVISION ;  Surgeon: Samson Frederic, MD;  Location: [facility] OR;  Service: Orthopedics;  Laterality: Left;    Vitals:   10/03/16 1042 10/03/16 1052  BP: (!) 200/84 (!) 188/93  Pulse: 61 61        Subjective Assessment - 10/03/16 1053    Subjective Pt arrived for visit today with daughter-in-law, who helped give narrative of last 3 medical visits (2 at the ED) pertaining to new HTN issue. Pt has been compliant with updated Rx changes, and has been monitoring BP at home regularly.    Patient is accompained by: Family member   Currently in Pain? No/denies                                   PT Short Term Goals - 09/05/16 1117      PT SHORT TERM GOAL #  1   Title After 4 weeks patient will demonstrate improved balance AEB DGI score 15/24.    Baseline 10/24    Status On-going     PT SHORT TERM GOAL #2   Title After 4 weeks patient will demonstrate improved gait speed, both self selected (>0.2329m/s) and maximal (0.6146m/s)    Status On-going     PT SHORT TERM GOAL #3   Title After 4 weeks patient will demosntrate improved power output AEB 5xSTS in less than 20 seconds.    Status On-going           PT Long Term Goals - 09/05/16 1117      PT LONG TERM GOAL #1   Title After 8 weeks patient will demosntrate improved power output AEB 5xSTS in less than 16 seconds.    Status On-going     PT LONG TERM  GOAL #2   Title After 8 weeks patient will demonstrate improved gait speed, both self selected (>0.2323m/s) and maximal (1.863m/s)    Status On-going     PT LONG TERM GOAL #3   Title After 8 weeks patient will demonstrate improved balance AEB DGI score 19/24.    Status On-going               Plan - 10/03/16 1056    Clinical Impression Statement Pt noted to have HTN upon arrival, checked twice after resting and remaining higher than what MD has recommended. Deferred session today, as it is a reassessment visit which will require maximal effort testing to assess progress since evaluation. Encouraged patient to contact PCP with details on HTN and seek recommendation/clearance for PT.       Patient will benefit from skilled therapeutic intervention in order to improve the following deficits and impairments:     Visit Diagnosis: Repeated falls  Unsteadiness on feet  Difficulty in walking, not elsewhere classified     Problem List Patient Active Problem List   Diagnosis Date Noted  . Urinary frequency 08/10/2015  . Postoperative anemia due to acute blood loss 07/27/2015  . SVT (supraventricular tachycardia) (HCC) 07/26/2015  . Femur fracture, left (HCC) 07/24/2015  . Chronic kidney disease 07/24/2015  . Periprosthetic fracture around internal prosthetic left hip joint (HCC) 07/24/2015  . Fall at home   . DVT (deep venous thrombosis) (HCC) 01/30/2014  . Occult blood positive stool 01/30/2014  . Thrombocytopenia, unspecified (HCC) 01/06/2014  . Anemia, iron deficiency 01/06/2014  . Hip fracture, left (HCC) 01/03/2014  . QT prolongation 01/03/2014  . Atherosclerotic cardiovascular disease 08/13/2013  . Hypertensive emergency 04/08/2013  . Dizzy 04/08/2013  . PAT (paroxysmal atrial tachycardia) (HCC) 12/25/2012  . SSS (sick sinus syndrome) with PAT and marked bradycardia with 3 sec pauses  08/30/2012  . Status post placement of cardiac pacemaker. 08/29/12 St. Jude device  08/30/2012  . HTN (hypertension) 08/30/2012  . Hyperlipidemia 08/30/2012  . Osteoporosis, unspecified 04/19/2012  . Essential and other specified forms of tremor 04/19/2012  . Essential hypertension 04/19/2012  . Cardiac dysrhythmia, unspecified 04/19/2012  . Chronic ischemic heart disease 04/19/2012  . Hereditary and idiopathic peripheral neuropathy 04/19/2012  . Memory loss 04/19/2012  . Disturbance of skin sensation 04/19/2012  . Pain in joint, shoulder region 11/20/2011  . Status post complete repair of rotator cuff 11/20/2011  . Muscle weakness (generalized) 11/20/2011   11:00 AM, 10/03/16 Rosamaria LintsAllan C Kelsy Polack, PT, DPT Physical Therapist [facility] [phone] (office)     [facility] [address] [place], [place], [address] Phone: [phone]   Fax:  [phone]  Name: Pamela [name] MRN: [id number] Date of Birth: 1921/02/01

## 2016-10-03 NOTE — Telephone Encounter (Signed)
Called, spoke with Hessie DienerAlan, PT. Hessie Dienerlan explained pt's BP was 200/84 today. After about 5 mins, pt's BP was 188/96. He did not proceed with PT due to elevated BP. He would like cut off parameter from Dr. Ladona Ridgelaylor. Read ED d/c note from 09/26/16 - stated pt should f/u with PCP for further management. Will forward to Dr. Ladona Ridgelaylor to advise.

## 2016-10-03 NOTE — Telephone Encounter (Signed)
Called, spoke with pt. Pt stated BP today at 4:00 PM was 175/82. Pt stated Carvedilol dose was increased today to 12.5 mg twice daily. Informed pt to have a schedule for taking medication and the importance of not missing doses and taking mediation on time. Informed pt to call our office if BP remains elevated or get worse, or if pt develops symptoms. Pt verbalized understanding.

## 2016-10-03 NOTE — Telephone Encounter (Signed)
Increase coreg to 12.5 mg twice daily. GT 

## 2016-10-03 NOTE — Telephone Encounter (Signed)
New message    Pt c/o BP issue: STAT if pt c/o blurred vision, one-sided weakness or slurred speech  1. What are your last 5 BP readings? 200/84 today when she arrived for PT.  2. Are you having any other symptoms (ex. Dizziness, headache, blurred vision, passed out)? No.   3. What is your BP issue? Hessie DienerAlan from [facility] is calling to ask what is the highest BP permitted prior to exertion? He states he did not work with Pt today due to her BP being high.

## 2016-10-03 NOTE — Telephone Encounter (Signed)
Patient reports her BP has been elevated for past week, intermittently.Was seen in ED twice in February for HTN, was given clonidine at first visit x 1 dose and discharged. States she was turned away from PT today, which helps her with balance because her BP was too high, reports home BP 176/84. Has no weight gain.      Please advise

## 2016-10-03 NOTE — Addendum Note (Signed)
Addended by: Marlyn CorporalARLTON, Kaitlen Redford A on: 10/03/2016 04:29 PM   Modules accepted: Orders

## 2016-10-03 NOTE — Telephone Encounter (Signed)
Pt is having problems w/ her BP, she went to therapy this morning and it was reading up around 200

## 2016-10-04 ENCOUNTER — Telehealth (HOSPITAL_COMMUNITY): Payer: Self-pay

## 2016-10-04 ENCOUNTER — Telehealth: Payer: Self-pay | Admitting: Internal Medicine

## 2016-10-04 NOTE — Telephone Encounter (Signed)
Patient states her BP is still up and she plans to return on 10/10/16 if able. NF 10/04/16

## 2016-10-04 NOTE — Telephone Encounter (Signed)
Called, spoke with daughter-in-law, Kriste BasqueBecky. Kriste BasqueBecky stated she was informed Dr. Ladona Ridgelaylor recommended to increase Carvedilol to 12.5 mg twice daily yesterday, but pt had already started increased dose by PCP last Thursday.  BP today is much better - BP systolic 140s today. Informed for pt to continue with the current meds and call our office if pt experiences elevated BP or symptoms. Informed Dr. Ladona Ridgelaylor may have pt to continue med, as long as BP is stable and improving. Will forward to Dr. Ladona Ridgelaylor to advise.

## 2016-10-04 NOTE — Telephone Encounter (Signed)
Pamela ItoBecky [name] is calling on behalf of patient and is requesting a call back in regards to patient carvedilol medication. [name] would like to clarify the dosage for patient. Please call to discuss, thanks.

## 2016-10-05 ENCOUNTER — Ambulatory Visit (INDEPENDENT_AMBULATORY_CARE_PROVIDER_SITE_OTHER): Payer: Medicare Other | Admitting: *Deleted

## 2016-10-05 ENCOUNTER — Ambulatory Visit (HOSPITAL_COMMUNITY): Payer: Medicare Other

## 2016-10-05 DIAGNOSIS — I495 Sick sinus syndrome: Secondary | ICD-10-CM | POA: Diagnosis not present

## 2016-10-05 NOTE — Progress Notes (Signed)
Remote pacemaker transmission.   

## 2016-10-06 ENCOUNTER — Encounter: Payer: Self-pay | Admitting: Cardiology

## 2016-10-06 LAB — CUP PACEART REMOTE DEVICE CHECK
Brady Statistic AP VP Percent: 1 %
Brady Statistic AP VS Percent: 14 %
Brady Statistic AS VP Percent: 1 %
Brady Statistic RV Percent Paced: 1 %
Date Time Interrogation Session: 20180308073026
Implantable Lead Location: 753859
Lead Channel Impedance Value: 360 Ohm
Lead Channel Impedance Value: 400 Ohm
Lead Channel Pacing Threshold Amplitude: 0.625 V
Lead Channel Sensing Intrinsic Amplitude: 12 mV
Lead Channel Setting Pacing Amplitude: 1.375
Lead Channel Setting Pacing Pulse Width: 0.4 ms
MDC IDC LEAD IMPLANT DT: 20140414
MDC IDC LEAD IMPLANT DT: 20140414
MDC IDC LEAD LOCATION: 753860
MDC IDC MSMT BATTERY REMAINING LONGEVITY: 140 mo
MDC IDC MSMT BATTERY REMAINING PERCENTAGE: 95.5 %
MDC IDC MSMT BATTERY VOLTAGE: 2.99 V
MDC IDC MSMT LEADCHNL RA PACING THRESHOLD AMPLITUDE: 0.375 V
MDC IDC MSMT LEADCHNL RA PACING THRESHOLD PULSEWIDTH: 0.4 ms
MDC IDC MSMT LEADCHNL RA SENSING INTR AMPL: 3.1 mV
MDC IDC MSMT LEADCHNL RV PACING THRESHOLD PULSEWIDTH: 0.4 ms
MDC IDC PG IMPLANT DT: 20140414
MDC IDC SET LEADCHNL RV PACING AMPLITUDE: 0.875
MDC IDC SET LEADCHNL RV SENSING SENSITIVITY: 2 mV
MDC IDC STAT BRADY AS VS PERCENT: 86 %
MDC IDC STAT BRADY RA PERCENT PACED: 13 %
Pulse Gen Model: 2210
Pulse Gen Serial Number: 7437302

## 2016-10-06 NOTE — Telephone Encounter (Signed)
I wanted her to take 18.375 bid. GT

## 2016-10-09 ENCOUNTER — Telehealth (HOSPITAL_COMMUNITY): Payer: Self-pay

## 2016-10-09 NOTE — Telephone Encounter (Signed)
She is sick on the stomach and the weather is bad.

## 2016-10-10 ENCOUNTER — Telehealth: Payer: Self-pay | Admitting: Internal Medicine

## 2016-10-10 ENCOUNTER — Ambulatory Visit (HOSPITAL_COMMUNITY): Payer: Medicare Other

## 2016-10-10 NOTE — Telephone Encounter (Signed)
Pt c/o BP issue: STAT if pt c/o blurred vision, one-sided weakness or slurred speech  1. What are your last 5 BP readings? 209/108  2. Are you having any other symptoms (ex. Dizziness, headache, blurred vision, passed out)? No other symptoms reported  3. What is your BP issue? Elevated BP

## 2016-10-11 ENCOUNTER — Telehealth (HOSPITAL_COMMUNITY): Payer: Self-pay

## 2016-10-11 NOTE — Telephone Encounter (Signed)
Pamela [name] will call MD and call [name] ( Daughter in [place]) back concerning pt's BP 192/102 with instruction to come to this apptment or not. NF 10/11/16

## 2016-10-12 ENCOUNTER — Ambulatory Visit (HOSPITAL_COMMUNITY): Payer: Medicare Other

## 2016-10-12 ENCOUNTER — Telehealth (HOSPITAL_COMMUNITY): Payer: Self-pay

## 2016-10-12 ENCOUNTER — Telehealth: Payer: Self-pay | Admitting: Internal Medicine

## 2016-10-12 MED ORDER — CARVEDILOL 12.5 MG PO TABS: 18.7500 mg | ORAL_TABLET | Freq: Two times a day (BID) | ORAL | 3 refills | Status: DC

## 2016-10-12 NOTE — Telephone Encounter (Signed)
Spoke with Kriste BasqueBecky and advised her to have patient increase Carvedilol to 18.75 mg bid per last phone note. They will continue to monitor

## 2016-10-12 NOTE — Telephone Encounter (Signed)
10/12/16 14:25 Returned call to [facility] at [facility] to confirm Coreg dosage of 18.75 bid as noted in MD note from 10/06/2016.  Jim Likeeri Suits [facility] RN CCM

## 2016-10-12 NOTE — Telephone Encounter (Signed)
No one called her back about her BP and she has an upset stomach today.

## 2016-10-12 NOTE — Telephone Encounter (Signed)
New message   Pt c/o medication issue:  1. Name of Medication: coreg 12.5 mg  2. How are you currently taking this medication (dosage and times per day)? Twice daily  3. Are you having a reaction (difficulty breathing--STAT)?   4. What is your medication issue? Pharmacy is calling for clarification on medication dose. She needs to know if pt should be on 12.5mg  1 tab twice daily or 12.5mg  1 and a half tabs twice daily?

## 2016-10-12 NOTE — Telephone Encounter (Signed)
Spoke with SpringerBecky and they wil increase Carvedilol to 18.75 mg bid.  I have sent in new RX.  They will continue to monitor BP's

## 2016-10-16 ENCOUNTER — Telehealth (HOSPITAL_COMMUNITY): Payer: Self-pay

## 2016-10-16 NOTE — Telephone Encounter (Signed)
Pt states her BP is still up 181/92 and she will need to cx this apptment. °

## 2016-10-16 NOTE — Telephone Encounter (Signed)
Pt states her BP is still up 181/92 and she will need to cx this apptment.

## 2016-10-17 ENCOUNTER — Ambulatory Visit (HOSPITAL_COMMUNITY): Payer: Medicare Other

## 2016-10-18 ENCOUNTER — Telehealth (HOSPITAL_COMMUNITY): Payer: Self-pay

## 2016-10-18 NOTE — Telephone Encounter (Signed)
Patient requsted to be put on hold until BP is undercontrol, we will call to reschedule the second week in April if pt is able to come at that time

## 2016-10-19 ENCOUNTER — Ambulatory Visit (HOSPITAL_COMMUNITY): Payer: Medicare Other | Admitting: Physical Therapy

## 2016-10-20 ENCOUNTER — Encounter: Payer: Self-pay | Admitting: Cardiology

## 2016-10-24 ENCOUNTER — Ambulatory Visit (HOSPITAL_COMMUNITY): Payer: Medicare Other

## 2016-10-26 ENCOUNTER — Encounter (HOSPITAL_COMMUNITY): Payer: Medicare Other

## 2016-11-10 DIAGNOSIS — H353113 Nonexudative age-related macular degeneration, right eye, advanced atrophic without subfoveal involvement: Secondary | ICD-10-CM | POA: Diagnosis not present

## 2016-11-16 DIAGNOSIS — Z6824 Body mass index (BMI) 24.0-24.9, adult: Secondary | ICD-10-CM | POA: Diagnosis not present

## 2016-11-16 DIAGNOSIS — J209 Acute bronchitis, unspecified: Secondary | ICD-10-CM | POA: Diagnosis not present

## 2016-11-16 DIAGNOSIS — J069 Acute upper respiratory infection, unspecified: Secondary | ICD-10-CM | POA: Diagnosis not present

## 2016-11-20 NOTE — Telephone Encounter (Signed)
This encounter was created in error - please disregard.

## 2016-11-21 DIAGNOSIS — H6120 Impacted cerumen, unspecified ear: Secondary | ICD-10-CM | POA: Diagnosis not present

## 2017-01-04 ENCOUNTER — Ambulatory Visit (INDEPENDENT_AMBULATORY_CARE_PROVIDER_SITE_OTHER): Payer: Medicare Other | Admitting: *Deleted

## 2017-01-04 DIAGNOSIS — I495 Sick sinus syndrome: Secondary | ICD-10-CM

## 2017-01-05 NOTE — Progress Notes (Signed)
Remote pacemaker transmission.   

## 2017-01-10 ENCOUNTER — Encounter: Payer: Self-pay | Admitting: Cardiology

## 2017-01-25 LAB — CUP PACEART REMOTE DEVICE CHECK
Brady Statistic AP VP Percent: 1 %
Brady Statistic AS VP Percent: 1 %
Brady Statistic AS VS Percent: 83 %
Brady Statistic RV Percent Paced: 1 %
Implantable Lead Implant Date: 20140414
Implantable Lead Location: 753859
Lead Channel Impedance Value: 430 Ohm
Lead Channel Pacing Threshold Amplitude: 0.625 V
Lead Channel Pacing Threshold Pulse Width: 0.4 ms
Lead Channel Sensing Intrinsic Amplitude: 12 mV
Lead Channel Setting Pacing Amplitude: 0.875
Lead Channel Setting Pacing Amplitude: 1.5 V
Lead Channel Setting Pacing Pulse Width: 0.4 ms
MDC IDC LEAD IMPLANT DT: 20140414
MDC IDC LEAD LOCATION: 753860
MDC IDC MSMT BATTERY REMAINING LONGEVITY: 142 mo
MDC IDC MSMT BATTERY REMAINING PERCENTAGE: 95.5 %
MDC IDC MSMT BATTERY VOLTAGE: 2.99 V
MDC IDC MSMT LEADCHNL RA IMPEDANCE VALUE: 380 Ohm
MDC IDC MSMT LEADCHNL RA PACING THRESHOLD AMPLITUDE: 0.5 V
MDC IDC MSMT LEADCHNL RA PACING THRESHOLD PULSEWIDTH: 0.4 ms
MDC IDC MSMT LEADCHNL RA SENSING INTR AMPL: 3 mV
MDC IDC PG IMPLANT DT: 20140414
MDC IDC SESS DTM: 20180607060424
MDC IDC SET LEADCHNL RV SENSING SENSITIVITY: 2 mV
MDC IDC STAT BRADY AP VS PERCENT: 17 %
MDC IDC STAT BRADY RA PERCENT PACED: 17 %
Pulse Gen Serial Number: 7437302

## 2017-01-26 ENCOUNTER — Other Ambulatory Visit: Payer: Self-pay | Admitting: Internal Medicine

## 2017-02-23 ENCOUNTER — Other Ambulatory Visit: Payer: Self-pay | Admitting: Internal Medicine

## 2017-03-05 ENCOUNTER — Encounter: Payer: Self-pay | Admitting: Neurology

## 2017-03-05 ENCOUNTER — Ambulatory Visit (INDEPENDENT_AMBULATORY_CARE_PROVIDER_SITE_OTHER): Payer: Medicare Other | Admitting: Neurology

## 2017-03-05 VITALS — BP 178/80 | HR 66 | Ht 67.0 in | Wt 137.5 lb

## 2017-03-05 DIAGNOSIS — G609 Hereditary and idiopathic neuropathy, unspecified: Secondary | ICD-10-CM | POA: Diagnosis not present

## 2017-03-05 DIAGNOSIS — R269 Unspecified abnormalities of gait and mobility: Secondary | ICD-10-CM

## 2017-03-05 HISTORY — DX: Unspecified abnormalities of gait and mobility: R26.9

## 2017-03-05 MED ORDER — DONEPEZIL HCL 5 MG PO TABS
5.0000 mg | ORAL_TABLET | Freq: Every day | ORAL | 1 refills | Status: DC
Start: 2017-03-05 — End: 2017-03-28

## 2017-03-05 NOTE — Patient Instructions (Signed)
   We will start aricept at night 5 mg.  Begin Aricept (donepezil) at 5 mg at night for one month. If this medication is well-tolerated, please call our office and we will call in a prescription for the 10 mg tablets. Look out for side effects that may include nausea, diarrhea, weight loss, or stomach cramps. This medication will also cause a runny nose, therefore there is no need for allergy medications for this purpose.

## 2017-03-05 NOTE — Progress Notes (Signed)
Reason for visit: Peripheral neuropathy  Pamela [name] is an 83 y.o. female  History of present illness:  Pamela [name] is a 83 year old right-handed white female with a history of a peripheral neuropathy associated with a gait disorder. The patient has mild burning sensation sometimes at nighttime, she uses topical lidocaine, oral gabapentin resulted in dizziness and gait instability. The patient feels better off of the medication. The patient claims that she usually sleeps fairly well. The discomfort in the feet during the daytime is minimal. She also has reported some troubles with memory that dates back more than 6 months, her daughter has been helping her with her medications and appointments, she does not operate a motor vehicle. The patient has macular degeneration and she has visual problems, she uses a walker to ambulate in the house and a cane to walk outside the house. She is still living in her own home, she has someone coming in for several hours each day to help her with bathing, cooking for her, and to do housework. The patient reports no falls since last seen.  Past Medical History:  Diagnosis Date  . Coronary artery disease    stent in the LAD artery in 2002  . HTN (hypertension) 08/30/2012  . Hyperlipidemia 08/30/2012  . ICD (implantable cardiac defibrillator) in place   . Macular degeneration   . Pacemaker 08/29/2012   st jude accent DR RF device, model number [id number], serial number [id number], implanted 08/29/2012 for sinus bradycardia, runs of supraventricular tachycardia  last checked 10/23/2012  . Polyneuropathy in other diseases classified elsewhere (HCC)   . SSS (sick sinus syndrome) with PAT and marked bradycardia with 3 sec pauses  08/30/2012  . Status post placement of cardiac pacemaker. 08/29/12 St. Jude device 08/30/2012  . Venous insufficiency     Past Surgical History:  Procedure Laterality Date  . ABDOMINAL HYSTERECTOMY    . CARDIAC CATHETERIZATION  2006   3  stents placed  . CARDIAC CATHETERIZATION  06/2001   placement of BiodivYsio 2.5.10mm stent dilated to 2.77mm in proximal left anterior descenting stenotic lesion  . CESAREAN SECTION    . COLONOSCOPY W/ POLYPECTOMY    . HIP ARTHROPLASTY Left 01/04/2014   Procedure: ARTHROPLASTY BIPOLAR HIP;  Surgeon: Shelda Pal, MD;  Location: [facility] OR;  Service: Orthopedics;  Laterality: Left;  . INSERT / REPLACE / REMOVE PACEMAKER  08/29/2012   st jude   . KIDNEY STONE SURGERY    . OPEN REDUCTION INTERNAL FIXATION (ORIF) DISTAL RADIAL FRACTURE Right 04/01/2016   Procedure: OPEN REDUCTION INTERNAL FIXATION (ORIF) RIGHT DISTAL RADIAL FRACTURE AND REPAIR;  Surgeon: Bradly Bienenstock, MD;  Location: [facility] OR;  Service: Orthopedics;  Laterality: Right;  . PERMANENT PACEMAKER INSERTION N/A 08/29/2012   Procedure: PERMANENT PACEMAKER INSERTION;  Surgeon: Thurmon Fair, MD;  Location: [facility] CATH LAB;  Service: Cardiovascular;  Laterality: N/A;  . RADIOFREQUENCY ABLATION  08/25/2010   ablation of left greater saphenous vein  . ROTATOR CUFF REPAIR Left   . ROTATOR CUFF REPAIR Right   . TOTAL HIP REVISION Left 07/24/2015   Procedure: ORIF LEFT PERIPROSTHETIC FEMUR FRACTURE; CONVERSION TO TOTAL HIP ARTHROPLASTY POSS. FEMORAL COMPONENT REVISION ;  Surgeon: Samson Frederic, MD;  Location: [facility] OR;  Service: Orthopedics;  Laterality: Left;    Family History  Problem Relation Age of Onset  . Heart disease Mother   . Congestive Heart Failure Mother   . Heart disease Father   . Heart attack Father   . Heart  disease Brother     Social history:  reports that she has never smoked. She has never used smokeless tobacco. She reports that she does not drink alcohol or use drugs.    Allergies  Allergen Reactions  . Gabapentin Other (See Comments)    Blurred vision, dizziness     Medications:  Prior to Admission medications   Medication Sig Start Date End Date Taking? Authorizing Provider  acetaminophen (TYLENOL) 500 MG tablet Take  500 mg by mouth every 6 (six) hours as needed for mild pain or moderate pain.    Yes [provider]  aspirin EC 81 MG tablet Take 81 mg by mouth every morning.    Yes [provider]  carvedilol (COREG) 12.5 MG tablet TAKE ONE AND ONE-HALF TABLETS BY MOUTH TWO TIMES DAILY 02/23/17  Yes Marinus Maw, MD  Cholecalciferol (VITAMIN D-3 PO) Take 1 capsule by mouth daily.   Yes [provider]  docusate sodium (COLACE) 100 MG capsule Take 1 capsule (100 mg total) by mouth 2 (two) times daily. 04/01/16  Yes Bradly Bienenstock, MD  furosemide (LASIX) 20 MG tablet TAKE ONE TABLET BY MOUTH DAILY AS NEEDEDFOR EDEMA 01/22/15  Yes Marinus Maw, MD  lidocaine (LMX) 4 % cream Apply 1 application topically 2 (two) times daily as needed.   Yes [provider]  Multiple Vitamins-Minerals (OCUVITE PO) Take 1 tablet by mouth 2 (two) times daily.   Yes [provider]  nitroGLYCERIN (NITROSTAT) 0.4 MG SL tablet Place 1 tablet (0.4 mg total) under the tongue every 5 (five) minutes as needed for chest pain. 06/01/15  Yes Marinus Maw, MD  Polyethyl Glycol-Propyl Glycol (SYSTANE OP) Apply 1 drop to eye daily as needed (Dry Eyes).    Yes [provider]  ramipril (ALTACE) 2.5 MG capsule TAKE ONE (1) TABLET BY MOUTH EVERY DAY 01/26/17  Yes Marinus Maw, MD  ramipril (ALTACE) 2.5 MG capsule TAKE ONE (1) TABLET BY MOUTH EVERY DAY 01/29/17  Yes Marinus Maw, MD  vitamin B-12 (CYANOCOBALAMIN) 1000 MCG tablet Take 1,000 mcg by mouth daily.   Yes [provider]    ROS:  Out of a complete 14 system review of symptoms, the patient complains only of the following symptoms, and all other reviewed systems are negative.  Light sensitivity, loss of vision Memory disturbance  Blood pressure (!) 178/80, pulse 66, height 5\' 7"  (1.702 m), weight 137 lb 8 oz (62.4 kg), SpO2 98 %.  Physical Exam  General: The patient is alert and cooperative at the time of the  examination.  Skin: No significant peripheral edema is noted.   Neurologic Exam  Mental status: The patient is alert and oriented x 2 at the time of the examination (not oriented to date). The Mini-Mental Status Examination done today shows a total score of 18/30.  Cranial nerves: Facial symmetry is present. Speech is normal, no aphasia or dysarthria is noted. Extraocular movements are full. Visual fields are full.  Motor: The patient has good strength in all 4 extremities.  Sensory examination: Soft touch sensation is symmetric on the face, arms, and legs.  Coordination: The patient has good finger-nose-finger and heel-to-shin bilaterally.  Gait and station: The patient has a slightly wide-based, unsteady gait. The patient walks with a cane. Tandem gait was not attempted. Romberg is negative. No drift is seen.  Reflexes: Deep tendon reflexes are symmetric.   Assessment/Plan:  1. Peripheral neuropathy  2. Gait disturbance  3. Memory  disturbance  The patient will be started on Aricept today, she will follow-up in 6 months, she will contact our office if she has any side effects on the medication. The patient is remaining in her own home with assistance.  Marlan Palau. Keith Purnell Daigle MD 03/05/2017 10:30 AM  [facility] [address] [place], [place] [id number]  Phone [phone] Fax [phone]

## 2017-03-28 ENCOUNTER — Telehealth: Payer: Self-pay | Admitting: *Deleted

## 2017-03-28 MED ORDER — DONEPEZIL HCL 5 MG PO TABS: 5.0000 mg | ORAL_TABLET | Freq: Every day | ORAL | 1 refills | Status: DC

## 2017-03-28 NOTE — Telephone Encounter (Signed)
90 day rx. Donepezil escribed to [facility] per faxed request/fim

## 2017-04-10 ENCOUNTER — Ambulatory Visit (INDEPENDENT_AMBULATORY_CARE_PROVIDER_SITE_OTHER): Payer: Medicare Other | Admitting: *Deleted

## 2017-04-10 DIAGNOSIS — I495 Sick sinus syndrome: Secondary | ICD-10-CM | POA: Diagnosis not present

## 2017-04-10 NOTE — Progress Notes (Signed)
Remote pacemaker transmission.   

## 2017-04-11 ENCOUNTER — Encounter: Payer: Self-pay | Admitting: Cardiology

## 2017-04-12 LAB — CUP PACEART REMOTE DEVICE CHECK
Implantable Lead Implant Date: 20140414
Implantable Pulse Generator Implant Date: 20140414
Lead Channel Setting Pacing Amplitude: 0.875
MDC IDC LEAD IMPLANT DT: 20140414
MDC IDC LEAD LOCATION: 753859
MDC IDC LEAD LOCATION: 753860
MDC IDC SESS DTM: 20180913151819
MDC IDC SET LEADCHNL RA PACING AMPLITUDE: 1.5 V
MDC IDC SET LEADCHNL RV PACING PULSEWIDTH: 0.4 ms
MDC IDC SET LEADCHNL RV SENSING SENSITIVITY: 2 mV
Pulse Gen Model: 2210
Pulse Gen Serial Number: 7437302

## 2017-04-16 ENCOUNTER — Telehealth: Payer: Self-pay | Admitting: Neurology

## 2017-04-16 MED ORDER — DONEPEZIL HCL 10 MG PO TABS: 10.0000 mg | ORAL_TABLET | Freq: Every day | ORAL | 5 refills | Status: DC

## 2017-04-16 NOTE — Telephone Encounter (Signed)
Patient called office in reference to donepezil (ARICEPT) 5 MG tablet.  Patient states she has tolerated the medication well with no side effects.  She would like to continue the medication, but would like to know if she will remain at  or increase to .  Pharmacy- [facility].  Please call

## 2017-04-16 NOTE — Addendum Note (Signed)
Addended by: Eilene Ghazi L on: 04/16/2017 01:06 PM   Modules accepted: Orders

## 2017-04-16 NOTE — Telephone Encounter (Signed)
Called pt. Verified she is tolerating Aricept  tablet with no SE. Tolerating well. She is agreeable to increase to  tablet. Verified pharmacy. Advised we will send in new rx with increase in dose. She verbalized understanding.

## 2017-04-16 NOTE — Telephone Encounter (Signed)
Pamela [name]/[facility] called to confirm increase in doseage was correct. Msg relayed below. She was appreciative  [name]

## 2017-04-16 NOTE — Telephone Encounter (Signed)
Noted, thank you

## 2017-04-20 DIAGNOSIS — L8991 Pressure ulcer of unspecified site, stage 1: Secondary | ICD-10-CM | POA: Diagnosis not present

## 2017-04-20 DIAGNOSIS — C44212 Basal cell carcinoma of skin of right ear and external auricular canal: Secondary | ICD-10-CM | POA: Diagnosis not present

## 2017-04-20 DIAGNOSIS — H6122 Impacted cerumen, left ear: Secondary | ICD-10-CM | POA: Diagnosis not present

## 2017-04-21 ENCOUNTER — Emergency Department (HOSPITAL_COMMUNITY)
Admission: EM | Admit: 2017-04-21 | Discharge: 2017-04-21 | Disposition: A | Discharge status: Home / Self Care | Payer: Medicare Other | Attending: Emergency Medicine | Admitting: Emergency Medicine

## 2017-04-21 ENCOUNTER — Emergency Department (HOSPITAL_COMMUNITY): Payer: Medicare Other

## 2017-04-21 ENCOUNTER — Encounter (HOSPITAL_COMMUNITY): Payer: Self-pay | Admitting: Emergency Medicine

## 2017-04-21 DIAGNOSIS — I10 Essential (primary) hypertension: Secondary | ICD-10-CM | POA: Diagnosis not present

## 2017-04-21 DIAGNOSIS — R4020 Unspecified coma: Secondary | ICD-10-CM | POA: Diagnosis not present

## 2017-04-21 DIAGNOSIS — I2510 Atherosclerotic heart disease of native coronary artery without angina pectoris: Secondary | ICD-10-CM | POA: Diagnosis not present

## 2017-04-21 DIAGNOSIS — R51 Headache: Secondary | ICD-10-CM | POA: Insufficient documentation

## 2017-04-21 DIAGNOSIS — N189 Chronic kidney disease, unspecified: Secondary | ICD-10-CM | POA: Insufficient documentation

## 2017-04-21 DIAGNOSIS — Z950 Presence of cardiac pacemaker: Secondary | ICD-10-CM | POA: Insufficient documentation

## 2017-04-21 DIAGNOSIS — I129 Hypertensive chronic kidney disease with stage 1 through stage 4 chronic kidney disease, or unspecified chronic kidney disease: Secondary | ICD-10-CM | POA: Diagnosis not present

## 2017-04-21 DIAGNOSIS — Z7982 Long term (current) use of aspirin: Secondary | ICD-10-CM | POA: Insufficient documentation

## 2017-04-21 LAB — I-STAT CHEM 8, ED
BUN: 15 mg/dL (ref 6–20)
CREATININE: 0.8 mg/dL (ref 0.44–1.00)
Calcium, Ion: 1.27 mmol/L (ref 1.15–1.40)
Chloride: 103 mmol/L (ref 101–111)
Glucose, Bld: 100 mg/dL — ABNORMAL HIGH (ref 65–99)
HEMATOCRIT: 33 % — AB (ref 36.0–46.0)
Hemoglobin: 11.2 g/dL — ABNORMAL LOW (ref 12.0–15.0)
POTASSIUM: 3.3 mmol/L — AB (ref 3.5–5.1)
Sodium: 142 mmol/L (ref 135–145)
TCO2: 28 mmol/L (ref 22–32)

## 2017-04-21 MED ORDER — RAMIPRIL 1.25 MG PO CAPS
ORAL_CAPSULE | ORAL | Status: AC
  Filled 2017-04-21: qty 2

## 2017-04-21 MED ORDER — HYDRALAZINE HCL 20 MG/ML IJ SOLN
2.0000 mg | Freq: Once | INTRAMUSCULAR | Status: AC
  Administered 2017-04-21: 2 mg via INTRAVENOUS
  Filled 2017-04-21: qty 1

## 2017-04-21 MED ORDER — RAMIPRIL 2.5 MG PO CAPS
2.5000 mg | ORAL_CAPSULE | Freq: Every day | ORAL | Status: DC
  Administered 2017-04-21: 2.5 mg via ORAL
  Filled 2017-04-21 (×2): qty 1

## 2017-04-21 MED ORDER — IBUPROFEN 800 MG PO TABS
800.0000 mg | ORAL_TABLET | Freq: Once | ORAL | Status: AC
  Administered 2017-04-21: 800 mg via ORAL
  Filled 2017-04-21: qty 1

## 2017-04-21 MED ORDER — HYDROCODONE-ACETAMINOPHEN 5-325 MG PO TABS
1.0000 | ORAL_TABLET | Freq: Once | ORAL | Status: AC
  Administered 2017-04-21: 1 via ORAL
  Filled 2017-04-21: qty 1

## 2017-04-21 NOTE — ED Notes (Signed)
Pt placed on bedpan x 3 to void

## 2017-04-21 NOTE — ED Provider Notes (Signed)
[facility]-EM[facility]Y DEPT Provider Note   CSN: [id number] Arrival date & time: 04/21/17  1556     History   Chief Complaint Chief Complaint  Patient presents with  . Hypertension    HPI Pamela [name] is a 83 y.o. female.  Patient complains of a headache and her blood pressure being high   The history is provided by the patient. No language interpreter was used.  Hypertension  This is a recurrent problem. The current episode started 2 days ago. The problem occurs constantly. The problem has not changed since onset.Associated symptoms include headaches. Pertinent negatives include no chest pain and no abdominal pain. Nothing aggravates the symptoms. Nothing relieves the symptoms.    Past Medical History:  Diagnosis Date  . Coronary artery disease    stent in the LAD artery in 2002  . Gait disorder 03/05/2017  . HTN (hypertension) 08/30/2012  . Hyperlipidemia 08/30/2012  . ICD (implantable cardiac defibrillator) in place   . Macular degeneration   . Pacemaker 08/29/2012   st jude accent DR RF device, model number [id number], serial number [id number], implanted 08/29/2012 for sinus bradycardia, runs of supraventricular tachycardia  last checked 10/23/2012  . Polyneuropathy in other diseases classified elsewhere (HCC)   . Shingles 08/2016  . SSS (sick sinus syndrome) with PAT and marked bradycardia with 3 sec pauses  08/30/2012  . Status post placement of cardiac pacemaker. 08/29/12 St. Jude device 08/30/2012  . Venous insufficiency     Patient Active Problem List   Diagnosis Date Noted  . Gait disorder 03/05/2017  . Urinary frequency 08/10/2015  . Postoperative anemia due to acute blood loss 07/27/2015  . SVT (supraventricular tachycardia) (HCC) 07/26/2015  . Femur fracture, left (HCC) 07/24/2015  . Chronic kidney disease 07/24/2015  . Periprosthetic fracture around internal prosthetic left hip joint (HCC) 07/24/2015  . Fall at home   . DVT (deep venous thrombosis) (HCC) 01/30/2014    . Occult blood positive stool 01/30/2014  . Thrombocytopenia, unspecified (HCC) 01/06/2014  . Anemia, iron deficiency 01/06/2014  . Hip fracture, left (HCC) 01/03/2014  . QT prolongation 01/03/2014  . Atherosclerotic cardiovascular disease 08/13/2013  . Hypertensive emergency 04/08/2013  . Dizzy 04/08/2013  . PAT (paroxysmal atrial tachycardia) (HCC) 12/25/2012  . SSS (sick sinus syndrome) with PAT and marked bradycardia with 3 sec pauses  08/30/2012  . Status post placement of cardiac pacemaker. 08/29/12 St. Jude device 08/30/2012  . HTN (hypertension) 08/30/2012  . Hyperlipidemia 08/30/2012  . Osteoporosis, unspecified 04/19/2012  . Essential and other specified forms of tremor 04/19/2012  . Essential hypertension 04/19/2012  . Cardiac dysrhythmia, unspecified 04/19/2012  . Chronic ischemic heart disease 04/19/2012  . Hereditary and idiopathic peripheral neuropathy 04/19/2012  . Memory loss 04/19/2012  . Disturbance of skin sensation 04/19/2012  . Pain in joint, shoulder region 11/20/2011  . Status post complete repair of rotator cuff 11/20/2011  . Muscle weakness (generalized) 11/20/2011    Past Surgical History:  Procedure Laterality Date  . ABDOMINAL HYSTERECTOMY    . CARDIAC CATHETERIZATION  2006   3 stents placed  . CARDIAC CATHETERIZATION  06/2001   placement of BiodivYsio 2.5.10mm stent dilated to 2.62mm in proximal left anterior descenting stenotic lesion  . CESAREAN SECTION    . COLONOSCOPY W/ POLYPECTOMY    . HIP ARTHROPLASTY Left 01/04/2014   Procedure: ARTHROPLASTY BIPOLAR HIP;  Surgeon: Shelda Pal, MD;  Location: [facility] OR;  Service: Orthopedics;  Laterality: Left;  . INSERT / REPLACE / REMOVE PACEMAKER  08/29/2012   st jude   . KIDNEY STONE SURGERY    . OPEN REDUCTION INTERNAL FIXATION (ORIF) DISTAL RADIAL FRACTURE Right 04/01/2016   Procedure: OPEN REDUCTION INTERNAL FIXATION (ORIF) RIGHT DISTAL RADIAL FRACTURE AND REPAIR;  Surgeon: Bradly Bienenstock, MD;  Location:  [facility] OR;  Service: Orthopedics;  Laterality: Right;  . PERMANENT PACEMAKER INSERTION N/A 08/29/2012   Procedure: PERMANENT PACEMAKER INSERTION;  Surgeon: Thurmon Fair, MD;  Location: [facility] CATH LAB;  Service: Cardiovascular;  Laterality: N/A;  . RADIOFREQUENCY ABLATION  08/25/2010   ablation of left greater saphenous vein  . ROTATOR CUFF REPAIR Left   . ROTATOR CUFF REPAIR Right   . TOTAL HIP REVISION Left 07/24/2015   Procedure: ORIF LEFT PERIPROSTHETIC FEMUR FRACTURE; CONVERSION TO TOTAL HIP ARTHROPLASTY POSS. FEMORAL COMPONENT REVISION ;  Surgeon: Samson Frederic, MD;  Location: [facility] OR;  Service: Orthopedics;  Laterality: Left;    OB History    No data available       Home Medications    Prior to Admission medications   Medication Sig Start Date End Date Taking? Authorizing Provider  acetaminophen (TYLENOL) 500 MG tablet Take 500 mg by mouth every 6 (six) hours as needed for mild pain or moderate pain.    Yes [provider]  aspirin EC 81 MG tablet Take 81 mg by mouth every morning.    Yes [provider]  carvedilol (COREG) 12.5 MG tablet TAKE ONE AND ONE-HALF TABLETS BY MOUTH TWO TIMES DAILY 02/23/17  Yes Marinus Maw, MD  Cholecalciferol (VITAMIN D-3 PO) Take 1 capsule by mouth daily.   Yes [provider]  docusate sodium (COLACE) 100 MG capsule Take 1 capsule (100 mg total) by mouth 2 (two) times daily. 04/01/16  Yes Bradly Bienenstock, MD  donepezil (ARICEPT) 10 MG tablet Take 1 tablet (10 mg total) by mouth at bedtime. Patient taking differently: Take 5 mg by mouth at bedtime.  04/16/17  Yes York Spaniel, MD  furosemide (LASIX) 20 MG tablet TAKE ONE TABLET BY MOUTH DAILY AS NEEDEDFOR EDEMA 01/22/15  Yes Marinus Maw, MD  lidocaine (LMX) 4 % cream Apply 1 application topically 2 (two) times daily as needed.   Yes [provider]  Multiple Vitamins-Minerals (OCUVITE PO) Take 1 tablet by mouth 2 (two) times daily.   Yes [provider]    nitroGLYCERIN (NITROSTAT) 0.4 MG SL tablet Place 1 tablet (0.4 mg total) under the tongue every 5 (five) minutes as needed for chest pain. 06/01/15  Yes Marinus Maw, MD  Polyethyl Glycol-Propyl Glycol (SYSTANE OP) Apply 1 drop to eye daily as needed (Dry Eyes).    Yes [provider]  ramipril (ALTACE) 2.5 MG capsule TAKE ONE (1) TABLET BY MOUTH EVERY DAY 01/26/17  Yes Marinus Maw, MD  vitamin B-12 (CYANOCOBALAMIN) 1000 MCG tablet Take 1,000 mcg by mouth daily.   Yes [provider]    Family History Family History  Problem Relation Age of Onset  . Heart disease Mother   . Congestive Heart Failure Mother   . Heart disease Father   . Heart attack Father   . Heart disease Brother     Social History Social History  Substance Use Topics  . Smoking status: Never Smoker  . Smokeless tobacco: Never Used  . Alcohol use No     Allergies   Gabapentin   Review of Systems Review of Systems  Constitutional: Negative for appetite change and fatigue.  HENT: Negative for congestion,  ear discharge and sinus pressure.   Eyes: Negative for discharge.  Respiratory: Negative for cough.   Cardiovascular: Negative for chest pain.  Gastrointestinal: Negative for abdominal pain and diarrhea.  Genitourinary: Negative for frequency and hematuria.  Musculoskeletal: Negative for back pain.  Skin: Negative for rash.  Neurological: Positive for headaches. Negative for seizures.  Psychiatric/Behavioral: Negative for hallucinations.     Physical Exam Updated Vital Signs BP (!) 185/81   Pulse 61   Temp (!) 97.4 F (36.3 C) (Temporal)   Resp (!) 22   Ht 5' 6.5" (1.689 m)   Wt 68.9 kg (152 lb)   SpO2 100%   BMI 24.17 kg/m   Physical Exam  Constitutional: She is oriented to person, place, and time. She appears well-developed.  HENT:  Head: Normocephalic.  Eyes: Conjunctivae and EOM are normal. No scleral icterus.  Neck: Neck supple. No thyromegaly present.   Cardiovascular: Normal rate and regular rhythm.  Exam reveals no gallop and no friction rub.   No murmur heard. Pulmonary/Chest: No stridor. She has no wheezes. She has no rales. She exhibits no tenderness.  Abdominal: She exhibits no distension. There is no tenderness. There is no rebound.  Musculoskeletal: Normal range of motion. She exhibits no edema.  Lymphadenopathy:    She has no cervical adenopathy.  Neurological: She is oriented to person, place, and time. She exhibits normal muscle tone. Coordination normal.  Skin: No rash noted. No erythema.  Psychiatric: She has a normal mood and affect. Her behavior is normal.     ED Treatments / Results  Labs (all labs ordered are listed, but only abnormal results are displayed) Labs Reviewed  I-STAT CHEM 8, ED - Abnormal; Notable for the following:       Result Value   Potassium 3.3 (*)    Glucose, Bld 100 (*)    Hemoglobin 11.2 (*)    HCT 33.0 (*)    All other components within normal limits    EKG  EKG Interpretation None       Radiology Ct Head Wo Contrast  Result Date: 04/21/2017 CLINICAL DATA:  Hypertension.  Altered level of consciousness. EXAM: CT HEAD WITHOUT CONTRAST TECHNIQUE: Contiguous axial images were obtained from the base of the skull through the vertex without intravenous contrast. COMPARISON:  09/26/2016 FINDINGS: Brain: Diffuse cerebral atrophy. Mild ventricular dilatation consistent with central atrophy. Low-attenuation changes throughout the deep white matter consistent with small vessel ischemia. No evidence of acute infarction, hemorrhage, hydrocephalus, extra-axial collection or mass lesion/mass effect. Vascular: Vascular calcifications in the internal carotid and vertebrobasilar arteries. Skull: Calvarium appears intact. Sinuses/Orbits: Paranasal sinuses and mastoid air cells are clear. Other: No significant changes since previous study. IMPRESSION: No acute intracranial abnormalities. Chronic atrophy and  small vessel ischemic changes. Electronically Signed   By: Burman Nieves M.D.   On: 04/21/2017 21:16    Procedures Procedures (including critical care time)  Medications Ordered in ED Medications  ramipril (ALTACE) capsule 2.5 mg (2.5 mg Oral Given 04/21/17 1817)  ibuprofen (ADVIL,MOTRIN) tablet 800 mg (800 mg Oral Given 04/21/17 1723)  HYDROcodone-acetaminophen (NORCO/VICODIN) 5-325 MG per tablet 1 tablet (1 tablet Oral Given 04/21/17 2023)  hydrALAZINE (APRESOLINE) injection 2 mg (2 mg Intravenous Given 04/21/17 2139)     Initial Impression / Assessment and Plan / ED Course  I have reviewed the triage vital signs and the nursing notes.  Pertinent labs & imaging results that were available during my care of the patient were reviewed by me and  considered in my medical decision making (see chart for details).     Labs and CT scan unremarkable. Patient's blood pressure improved with giving her an extra Altace and some hydralazine. We will increase her Altace to 5 mg a day and have her follow-up with her PCP this week  Final Clinical Impressions(s) / ED Diagnoses   Final diagnoses:  Essential hypertension    New Prescriptions New Prescriptions   No medications on file     Bethann Berkshire, MD 04/21/17 2145

## 2017-04-21 NOTE — Discharge Instructions (Signed)
Increase altace to take two of the  2.5mg  tablets each morning

## 2017-04-21 NOTE — ED Triage Notes (Signed)
Pt brought in by her home aid for hypertension.  BP been up all day and systolic above 200.

## 2017-04-24 ENCOUNTER — Telehealth: Payer: Self-pay | Admitting: Internal Medicine

## 2017-04-24 NOTE — Telephone Encounter (Signed)
Daughter wanted just wanted to let us know ED changed her meds

## 2017-04-24 NOTE — Telephone Encounter (Signed)
Pt's daughter in law is Richelle Ito [phone]

## 2017-04-24 NOTE — Telephone Encounter (Signed)
Pt was seen in the ER and they changed the dose on her ramipril (ALTACE) 2.5 MG capsule [[id number]] per phone call from pt's daughter in law

## 2017-04-25 DIAGNOSIS — C44319 Basal cell carcinoma of skin of other parts of face: Secondary | ICD-10-CM | POA: Diagnosis not present

## 2017-05-09 DIAGNOSIS — H353113 Nonexudative age-related macular degeneration, right eye, advanced atrophic without subfoveal involvement: Secondary | ICD-10-CM | POA: Diagnosis not present

## 2017-05-21 ENCOUNTER — Other Ambulatory Visit: Payer: Self-pay | Admitting: Internal Medicine

## 2017-05-29 DIAGNOSIS — Z6824 Body mass index (BMI) 24.0-24.9, adult: Secondary | ICD-10-CM | POA: Diagnosis not present

## 2017-05-29 DIAGNOSIS — I2510 Atherosclerotic heart disease of native coronary artery without angina pectoris: Secondary | ICD-10-CM | POA: Diagnosis not present

## 2017-05-29 DIAGNOSIS — Z0001 Encounter for general adult medical examination with abnormal findings: Secondary | ICD-10-CM | POA: Diagnosis not present

## 2017-05-29 DIAGNOSIS — E782 Mixed hyperlipidemia: Secondary | ICD-10-CM | POA: Diagnosis not present

## 2017-05-31 DIAGNOSIS — Z23 Encounter for immunization: Secondary | ICD-10-CM | POA: Diagnosis not present

## 2017-05-31 DIAGNOSIS — Z6824 Body mass index (BMI) 24.0-24.9, adult: Secondary | ICD-10-CM | POA: Diagnosis not present

## 2017-05-31 DIAGNOSIS — Z1389 Encounter for screening for other disorder: Secondary | ICD-10-CM | POA: Diagnosis not present

## 2017-06-12 DIAGNOSIS — Z08 Encounter for follow-up examination after completed treatment for malignant neoplasm: Secondary | ICD-10-CM | POA: Diagnosis not present

## 2017-06-12 DIAGNOSIS — L570 Actinic keratosis: Secondary | ICD-10-CM | POA: Diagnosis not present

## 2017-06-12 DIAGNOSIS — Z85828 Personal history of other malignant neoplasm of skin: Secondary | ICD-10-CM | POA: Diagnosis not present

## 2017-06-12 DIAGNOSIS — X32XXXD Exposure to sunlight, subsequent encounter: Secondary | ICD-10-CM | POA: Diagnosis not present

## 2017-06-26 ENCOUNTER — Encounter (HOSPITAL_COMMUNITY): Payer: Medicare Other | Attending: Oncology | Admitting: Oncology

## 2017-06-26 ENCOUNTER — Encounter (HOSPITAL_COMMUNITY): Payer: Medicare Other

## 2017-06-26 ENCOUNTER — Encounter (HOSPITAL_COMMUNITY): Payer: Self-pay | Admitting: Oncology

## 2017-06-26 ENCOUNTER — Other Ambulatory Visit: Payer: Self-pay

## 2017-06-26 DIAGNOSIS — D61818 Other pancytopenia: Secondary | ICD-10-CM | POA: Insufficient documentation

## 2017-06-26 LAB — COMPREHENSIVE METABOLIC PANEL
ALBUMIN: 4.2 g/dL (ref 3.5–5.0)
ALT: 15 U/L (ref 14–54)
ANION GAP: 7 (ref 5–15)
AST: 23 U/L (ref 15–41)
Alkaline Phosphatase: 48 U/L (ref 38–126)
BILIRUBIN TOTAL: 0.8 mg/dL (ref 0.3–1.2)
BUN: 21 mg/dL — ABNORMAL HIGH (ref 6–20)
CHLORIDE: 104 mmol/L (ref 101–111)
CO2: 29 mmol/L (ref 22–32)
Calcium: 9.9 mg/dL (ref 8.9–10.3)
Creatinine, Ser: 0.89 mg/dL (ref 0.44–1.00)
GFR calc Af Amer: >60 mL/min (ref >60)
GFR, EST NON AFRICAN AMERICAN: 54 mL/min — AB (ref >60)
Glucose, Bld: 115 mg/dL — ABNORMAL HIGH (ref 65–99)
POTASSIUM: 3.5 mmol/L (ref 3.5–5.1)
Sodium: 140 mmol/L (ref 135–145)
TOTAL PROTEIN: 6.5 g/dL (ref 6.5–8.1)

## 2017-06-26 LAB — CBC WITH DIFFERENTIAL/PLATELET
BASOS PCT: 1 %
Basophils Absolute: 0 10*3/uL (ref 0.0–0.1)
EOS PCT: 3 %
Eosinophils Absolute: 0.1 10*3/uL (ref 0.0–0.7)
HEMATOCRIT: 38.3 % (ref 36.0–46.0)
Hemoglobin: 11.6 g/dL — ABNORMAL LOW (ref 12.0–15.0)
Lymphocytes Relative: 33 %
Lymphs Abs: 1.2 10*3/uL (ref 0.7–4.0)
MCH: 29.6 pg (ref 26.0–34.0)
MCHC: 30.3 g/dL (ref 30.0–36.0)
MCV: 97.7 fL (ref 78.0–100.0)
MONO ABS: 0.4 10*3/uL (ref 0.1–1.0)
MONOS PCT: 11 %
NEUTROS ABS: 1.9 10*3/uL (ref 1.7–7.7)
Neutrophils Relative %: 52 %
PLATELETS: 153 10*3/uL (ref 150–400)
RBC: 3.92 MIL/uL (ref 3.87–5.11)
RDW: 14.9 % (ref 11.5–15.5)
WBC: 3.6 10*3/uL — ABNORMAL LOW (ref 4.0–10.5)

## 2017-06-26 LAB — IRON AND TIBC
IRON: 70 ug/dL (ref 28–170)
SATURATION RATIOS: 20 % (ref 10.4–31.8)
TIBC: 353 ug/dL (ref 250–450)
UIBC: 283 ug/dL

## 2017-06-26 LAB — FERRITIN: FERRITIN: 71 ng/mL (ref 11–307)

## 2017-06-26 LAB — RETICULOCYTES
RBC.: 3.92 MIL/uL (ref 3.87–5.11)
Retic Count, Absolute: 39.2 10*3/uL (ref 19.0–186.0)
Retic Ct Pct: 1 % (ref 0.4–3.1)

## 2017-06-26 LAB — FOLATE: Folate: 87 ng/mL (ref >5.9)

## 2017-06-26 LAB — VITAMIN B12: Vitamin B-12: 818 pg/mL (ref 180–914)

## 2017-06-26 NOTE — Patient Instructions (Signed)
[facility] Discharge Instructions  RECOMMENDATIONS MADE BY THE CONSULTANT AND ANY TEST RESULTS WILL BE SENT TO YOUR REFERRING PHYSICIAN.  You were seen today by Dr. Ralene CorkLouise Zhou You will have lab work done today Follow up in 4 weeks   Thank you for choosing [facility] at [facility] to provide your oncology and hematology care.  To afford each patient quality time with our provider, please arrive at least 15 minutes before your scheduled appointment time.    If you have a lab appointment with the Cancer Center please come in thru the  Main Entrance and check in at the main information desk  You need to re-schedule your appointment should you arrive 10 or more minutes late.  We strive to give you quality time with our providers, and arriving late affects you and other patients whose appointments are after yours.  Also, if you no show three or more times for appointments you may be dismissed from the clinic at the providers discretion.     Again, thank you for choosing [facility].  Our hope is that these requests will decrease the amount of time that you wait before being seen by our physicians.       _____________________________________________________________  Should you have questions after your visit to [facility], please contact our office at [phone] between the hours of 8:30 a.m. and 4:30 p.m.  Voicemails left after 4:30 p.m. will not be returned until the following business day.  For prescription refill requests, have your pharmacy contact our office.       Resources For Cancer Patients and their Caregivers ? American Cancer Society: Can assist with transportation, wigs, general needs, runs Look Good Feel Better.        [phone] ? Cancer Care: Provides financial assistance, online support groups, medication/co-pay assistance.  1-800-813-HOPE (513) 682-5132(4673) ? [facility] Assists [place] cancer patients and their families through emotional , educational and financial support.  [phone] ? [place] DSS Where to apply for food stamps, Medicaid and utility assistance. 864-632-3103442-775-9460 ? [facility]: Transportation to medical appointments. 2524022978517-668-9699 ? Social Security Administration: May apply for disability if have a Stage IV cancer. 4370570494754 [phone] ? [place] [facility], Dis[facility] and Transit Services: Assists with nutrition, care and transit needs. ([phone]  Cancer Center Support Programs: @10RELATIVEDAYS @ > Cancer Support Group  2nd Tuesday of the month 1pm-2pm, Journey Room  > Creative Journey  3rd Tuesday of the month 1130am-1pm, Journey Room  > Look Good Feel Better  1st Wednesday of the month 10am-12 noon, Journey Room (Call American Cancer Society to register [phone])

## 2017-06-26 NOTE — Progress Notes (Signed)
[facility] Initial Visit:  Patient Care Team: Redmond School, MD as PCP - General (Internal Medicine) Evans Lance, MD as Consulting Physician (Cardiology)  CHIEF COMPLAINTS/PURPOSE OF CONSULTATION:  Panyctopenia  HISTORY OF PRESENTING ILLNESS: Pamela [name] 83 y.o. female is here with her daughter in law because of pancytopenia.  Patient had routine blood work performed by her PCP on 05/31/2017 which demonstrated a WBC 2.2K, hemoglobin 11 g/dL, hematocrit 34.4%, platelet count 135K. Previous CBC from 09/21/2016 demonstrated WBC 4.1K, hemoglobin 11.5 g/dL, hematocrit 35.8%, platelet count 143K.  Patient states she feels tired.  She is has vision problems related to her macular degeneration.  She also has been having problems with uncontrolled hypertension.  She recently had hydralazine added to her medication list, however causes her dizziness.  She has chronic sleep problems.  She has neuropathy in her feet.  She denies any chest pain, shortness of breath, abdominal pain, nausea, vomiting, diarrhea.  She denies any recent infections.  Review of Systems - Oncology ROS as per HPI otherwise 12 point ROS is negative.  MEDICAL HISTORY: Past Medical History:  Diagnosis Date  . Coronary artery disease    stent in the LAD artery in 2002  . Gait disorder 03/05/2017  . HTN (hypertension) 08/30/2012  . Hyperlipidemia 08/30/2012  . ICD (implantable cardiac defibrillator) in place   . Macular degeneration   . Pacemaker 08/29/2012   st jude accent DR RF device, model number [id number], serial number [id number], implanted 08/29/2012 for sinus bradycardia, runs of supraventricular tachycardia  last checked 10/23/2012  . Polyneuropathy in other diseases classified elsewhere ([place])   . Shingles 08/2016  . SSS (sick sinus syndrome) with PAT and marked bradycardia with 3 sec pauses  08/30/2012  . Status post placement of cardiac pacemaker. 08/29/12 St. Jude device 08/30/2012  . Venous  insufficiency     SURGICAL HISTORY: Past Surgical History:  Procedure Laterality Date  . ABDOMINAL HYSTERECTOMY    . CARDIAC CATHETERIZATION  2006   3 stents placed  . CARDIAC CATHETERIZATION  06/2001   placement of BiodivYsio 2.5.10mm stent dilated to 2.15m in proximal left anterior descenting stenotic lesion  . CESAREAN SECTION    . COLONOSCOPY W/ POLYPECTOMY    . HIP ARTHROPLASTY Left 01/04/2014   Procedure: ARTHROPLASTY BIPOLAR HIP;  Surgeon: MMauri Pole MD;  [facility]  Service: Orthopedics;  Laterality: Left;  . INSERT / REPLACE / REMOVE PACEMAKER  08/29/2012   st jude   . KIDNEY STONE SURGERY    . OPEN REDUCTION INTERNAL FIXATION (ORIF) DISTAL RADIAL FRACTURE Right 04/01/2016   Procedure: OPEN REDUCTION INTERNAL FIXATION (ORIF) RIGHT DISTAL RADIAL FRACTURE AND REPAIR;  Surgeon: FIran Planas MD;  [facility]  Service: Orthopedics;  Laterality: Right;  . PERMANENT PACEMAKER INSERTION N/A 08/29/2012   Procedure: PERMANENT PACEMAKER INSERTION;  Surgeon: MSanda Klein MD;  [facility];  Service: Cardiovascular;  Laterality: N/A;  . RADIOFREQUENCY ABLATION  08/25/2010   ablation of left greater saphenous vein  . ROTATOR CUFF REPAIR Left   . ROTATOR CUFF REPAIR Right   . TOTAL HIP REVISION Left 07/24/2015   Procedure: ORIF LEFT PERIPROSTHETIC FEMUR FRACTURE; CONVERSION TO TOTAL HIP ARTHROPLASTY POSS. FEMORAL COMPONENT REVISION ;  Surgeon: BRod Can MD;  [facility]  Service: Orthopedics;  Laterality: Left;    SOCIAL HISTORY: Social History   Socioeconomic History  . Marital status: Widowed    Spouse name: Not on file  . Number of  children: 2  . Years of education: Not on file  . Highest education level: Not on file  Social Needs  . Financial resource strain: Not on file  . Food insecurity - worry: Not on file  . Food insecurity - inability: Not on file  . Transportation needs - medical: Not on file  . Transportation needs - non-medical:  Not on file  Occupational History    Employer: RETIRED  Tobacco Use  . Smoking status: Never Smoker  . Smokeless tobacco: Never Used  Substance and Sexual Activity  . Alcohol use: No    Alcohol/week: 0.0 oz  . Drug use: No  . Sexual activity: Not on file  Other Topics Concern  . Not on file  Social History Narrative   Patient drinks about 2 cups of caffeine daily.   Patient is right handed.    FAMILY HISTORY Family History  Problem Relation Age of Onset  . Heart disease Mother   . Congestive Heart Failure Mother   . Heart disease Father   . Heart attack Father   . Heart disease Brother     ALLERGIES:  is allergic to gabapentin.  MEDICATIONS:  Current Outpatient Medications  Medication Sig Dispense Refill  . acetaminophen (TYLENOL) 500 MG tablet Take 500 mg by mouth every 6 (six) hours as needed for mild pain or moderate pain.     Marland Kitchen aspirin EC 81 MG tablet Take 81 mg by mouth every morning.     . calcium-vitamin D (OSCAL WITH D) 500-200 MG-UNIT tablet Take 1 tablet by mouth daily.    . carvedilol (COREG) 12.5 MG tablet TAKE ONE AND ONE-HALF TABLETS TWICE DAILY 270 tablet 1  . Cholecalciferol (VITAMIN D-3 PO) Take 1 capsule by mouth daily.    Marland Kitchen docusate sodium (COLACE) 100 MG capsule Take 1 capsule (100 mg total) by mouth 2 (two) times daily. 10 capsule 0  . donepezil (ARICEPT) 10 MG tablet Take 1 tablet (10 mg total) by mouth at bedtime. (Patient taking differently: Take 5 mg by mouth at bedtime. ) 30 tablet 5  . folic acid (FOLVITE) 1 MG tablet Take 1 mg by mouth daily.    . furosemide (LASIX) 20 MG tablet TAKE ONE TABLET BY MOUTH DAILY AS NEEDEDFOR EDEMA 30 tablet 2  . hydrALAZINE (APRESOLINE) 25 MG tablet Take 1 tablet by mouth 3 (three) times daily.  10  . lidocaine (LMX) 4 % cream Apply 1 application topically 2 (two) times daily as needed.    . Multiple Vitamins-Minerals (OCUVITE PO) Take 1 tablet by mouth 2 (two) times daily.    . nitroGLYCERIN (NITROSTAT) 0.4 MG SL  tablet Place 1 tablet (0.4 mg total) under the tongue every 5 (five) minutes as needed for chest pain. 100 tablet 3  . Polyethyl Glycol-Propyl Glycol (SYSTANE OP) Apply 1 drop to eye daily as needed (Dry Eyes).     . ramipril (ALTACE) 5 MG capsule Take 1 capsule by mouth daily.  0   No current facility-administered medications for this visit.     PHYSICAL EXAMINATION:   Vitals:   06/26/17 0856  BP: (!) 174/77  Pulse: 80  Resp: 16  Temp: 98.5 F (36.9 C)  SpO2: 97%    Filed Weights   06/26/17 0856  Weight: 134 lb 8 oz (61 kg)     Physical Exam   LABORATORY DATA: I have personally reviewed the data as listed:  No visits with results within 1 Month(s) from this visit.  Latest  known visit with results is:  Admission on 04/21/2017, Discharged on 04/21/2017  Component Date Value Ref Range Status  . Sodium 04/21/2017 142  135 - 145 mmol/L Final  . Potassium 04/21/2017 3.3* 3.5 - 5.1 mmol/L Final  . Chloride 04/21/2017 103  101 - 111 mmol/L Final  . BUN 04/21/2017 15  6 - 20 mg/dL Final  . Creatinine, Ser 04/21/2017 0.80  0.44 - 1.00 mg/dL Final  . Glucose, Bld 04/21/2017 100* 65 - 99 mg/dL Final  . Calcium, Ion 04/21/2017 1.27  1.15 - 1.40 mmol/L Final  . TCO2 04/21/2017 28  22 - 32 mmol/L Final  . Hemoglobin 04/21/2017 11.2* 12.0 - 15.0 g/dL Final  . HCT 04/21/2017 33.0* 36.0 - 46.0 % Final    RADIOGRAPHIC STUDIES: I have personally reviewed the radiological images as listed and agree with the findings in the report  No results found.  ASSESSMENT: Mild pancytopenia  PLAN: -Will recheck a CBC, CMP. -Rule out nutritional deficiencies, such as iron, B12, folate. -Check an SPEP/IFE to rule out multiple myeloma. -Check a JAK2 to rule out myelofibrosis as an underlying cause. -She may just have mild decrease in production secondary to her age. I would not be very aggressive at this time, in terms of pursuing a bone marrow biopsy since her blood counts are mildly low.   -RTC in 4 weeks to review lab results and discuss next plan of care.   Orders Placed This Encounter  Procedures  . CBC with Differential    Standing Status:   Future    Number of Occurrences:   1    Standing Expiration Date:   06/26/2018  . Comprehensive metabolic panel    Standing Status:   Future    Number of Occurrences:   1    Standing Expiration Date:   06/26/2018  . Iron and TIBC    Standing Status:   Future    Number of Occurrences:   1    Standing Expiration Date:   06/26/2018  . Ferritin    Standing Status:   Future    Number of Occurrences:   1    Standing Expiration Date:   06/26/2018  . Vitamin B12    Standing Status:   Future    Number of Occurrences:   1    Standing Expiration Date:   06/26/2018  . Folate    Standing Status:   Future    Number of Occurrences:   1    Standing Expiration Date:   06/26/2018  . Erythropoietin    Standing Status:   Future    Number of Occurrences:   1    Standing Expiration Date:   06/26/2018  . Reticulocytes    Standing Status:   Future    Number of Occurrences:   1    Standing Expiration Date:   06/26/2018  . Protein electrophoresis, serum    Standing Status:   Future    Number of Occurrences:   1    Standing Expiration Date:   06/26/2018  . Immunofixation electrophoresis    Standing Status:   Future    Number of Occurrences:   1    Standing Expiration Date:   06/26/2018  . JAK2 V617F, w Reflex to CALR/E12/MPL    Standing Status:   Future    Number of Occurrences:   1    Standing Expiration Date:   06/26/2018    All questions were answered. The patient knows to call the  clinic with any problems, questions or concerns.  This note was electronically signed.    Twana First, MD  06/26/2017 9:27 AM

## 2017-06-27 LAB — ERYTHROPOIETIN: Erythropoietin: 12.8 m[IU]/mL (ref 2.6–18.5)

## 2017-06-27 LAB — PROTEIN ELECTROPHORESIS, SERUM
A/G RATIO SPE: 1.5 (ref 0.7–1.7)
Albumin ELP: 3.8 g/dL (ref 2.9–4.4)
Alpha-1-Globulin: 0.3 g/dL (ref 0.0–0.4)
Alpha-2-Globulin: 0.7 g/dL (ref 0.4–1.0)
BETA GLOBULIN: 1 g/dL (ref 0.7–1.3)
GAMMA GLOBULIN: 0.5 g/dL (ref 0.4–1.8)
GLOBULIN, TOTAL: 2.5 g/dL (ref 2.2–3.9)
TOTAL PROTEIN ELP: 6.3 g/dL (ref 6.0–8.5)

## 2017-06-27 LAB — IMMUNOFIXATION ELECTROPHORESIS
IGA: 138 mg/dL (ref 64–422)
IgG (Immunoglobin G), Serum: 536 mg/dL — ABNORMAL LOW (ref 700–1600)
IgM (Immunoglobulin M), Srm: 40 mg/dL (ref 26–217)
Total Protein ELP: 6.6 g/dL (ref 6.0–8.5)

## 2017-06-28 IMAGING — CT CT ABDOMEN WO/W CM
2 of 9 series · 10 of 46 positions shown, 16 images · IV contrast (Omnipaque 300)
Comparison: CT scan 02/10/2015

CLINICAL DATA: Followup pancreatic cyst noted on previous lumbar CT
scan.

EXAM:
CT ABDOMEN WITHOUT AND WITH CONTRAST
TECHNIQUE: Multidetector CT imaging of the abdomen was performed following the
standard protocol before and following the bolus administration of
intravenous contrast.
CONTRAST:  100 cc Omnipaque 300

[Series 3: venous 60 sec 3.0 b40f · axial · portal-venous · 0.70mm/px · z∈[-208,-46]mm · 7 of 72 slices shown, 12 images]
[im 9/72  soft-tissue]
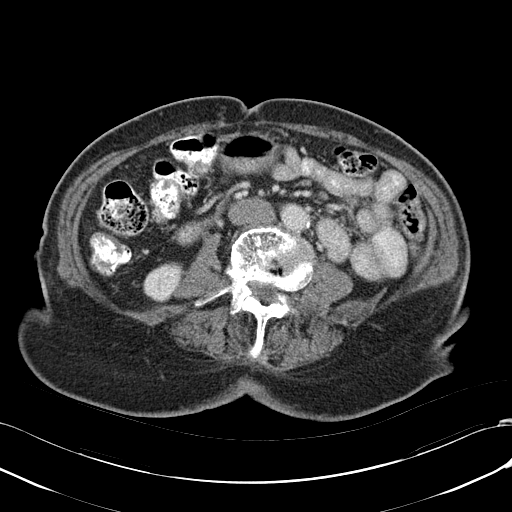
[im 9/72  bone]
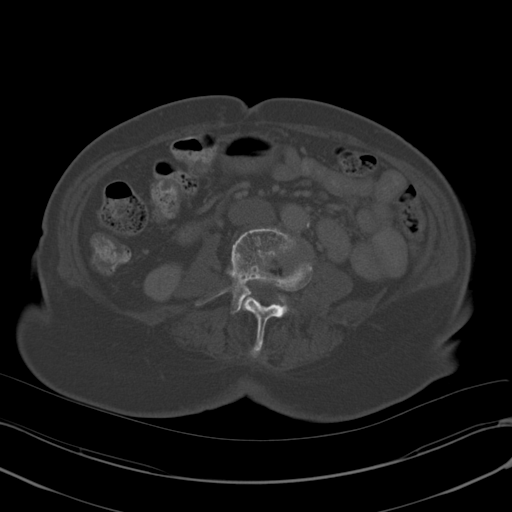
[im 18/72  soft-tissue]
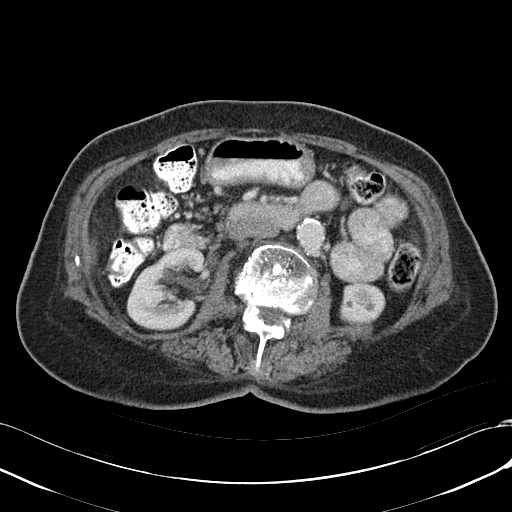
[im 27/72  soft-tissue]
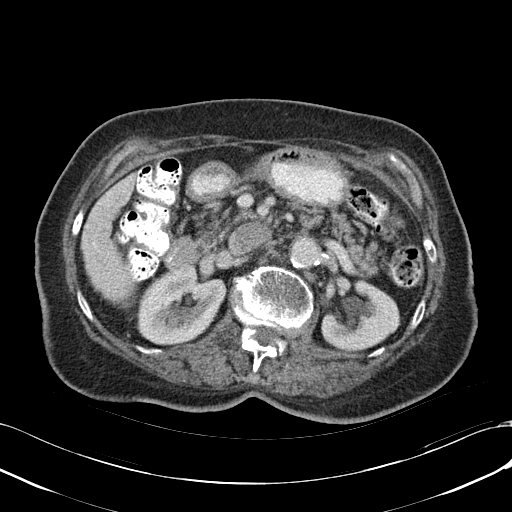
[im 36/72  soft-tissue]
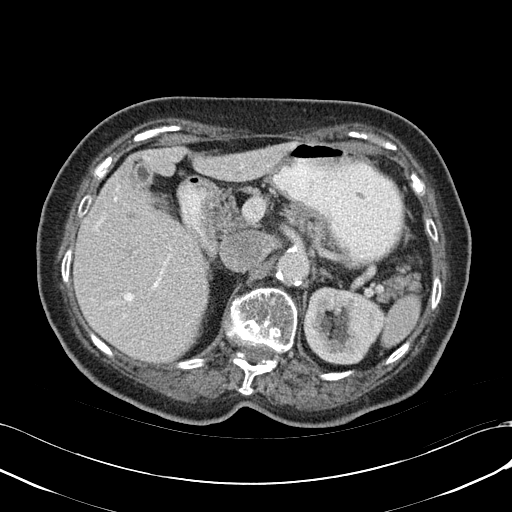
[im 36/72  lung]
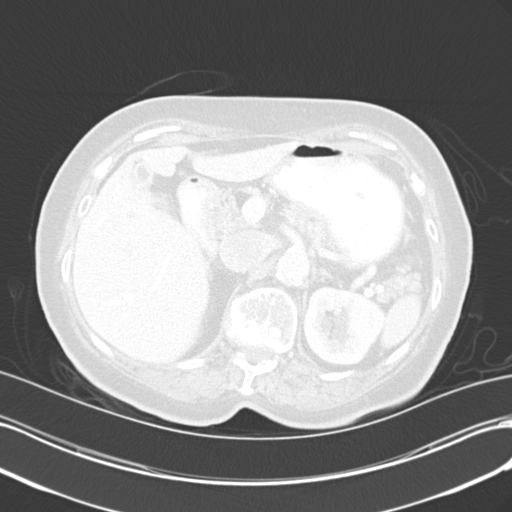
[im 45/72  soft-tissue]
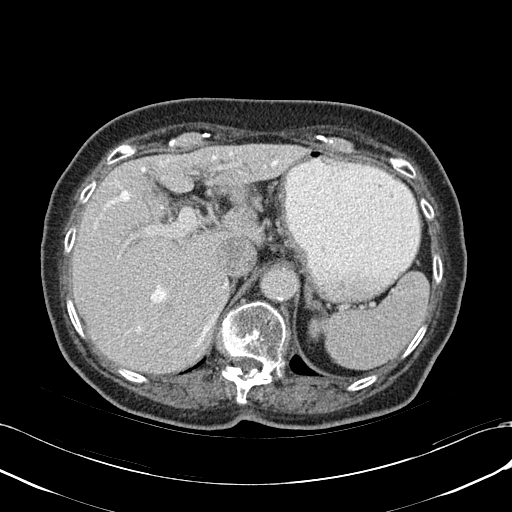
[im 45/72  lung]
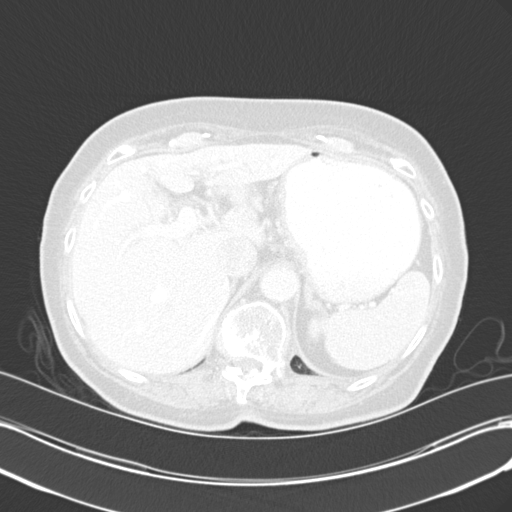
[im 54/72  soft-tissue]
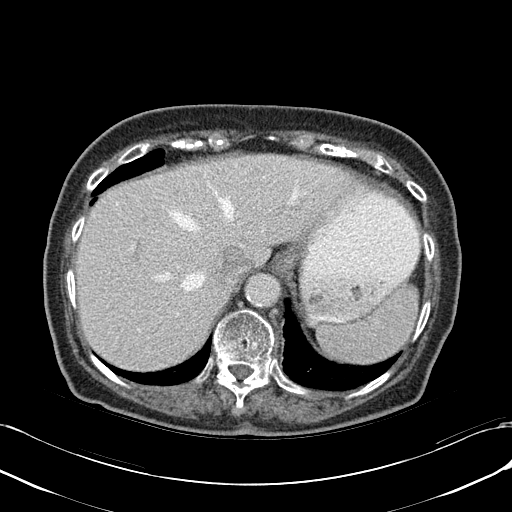
[im 54/72  lung]
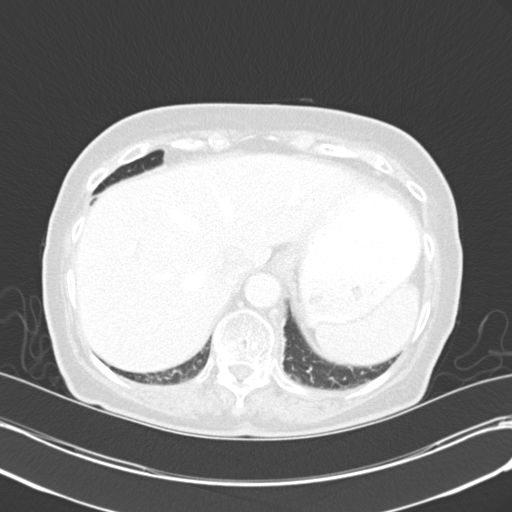
[im 63/72  soft-tissue]
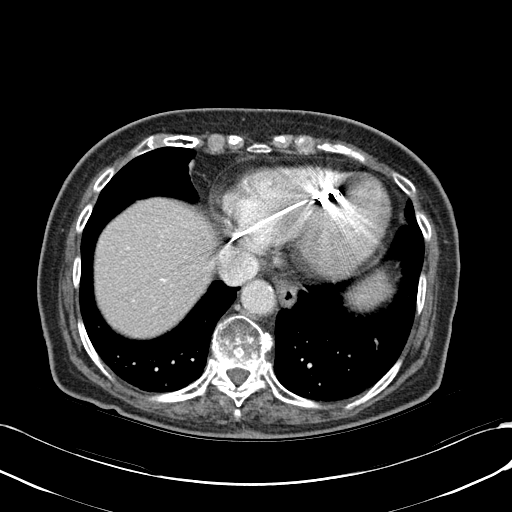
[im 63/72  lung]
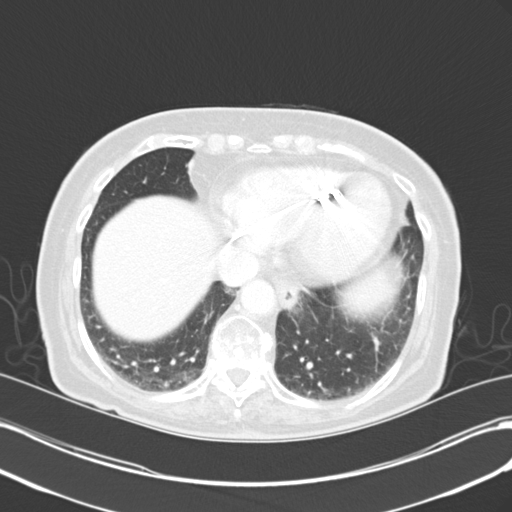

[Series 6: mpr arterial cor 3.0mm · coronal · arterial · 0.42mm/px · 3 of 78 slices shown, 4 images]
[im 20/78  soft-tissue]
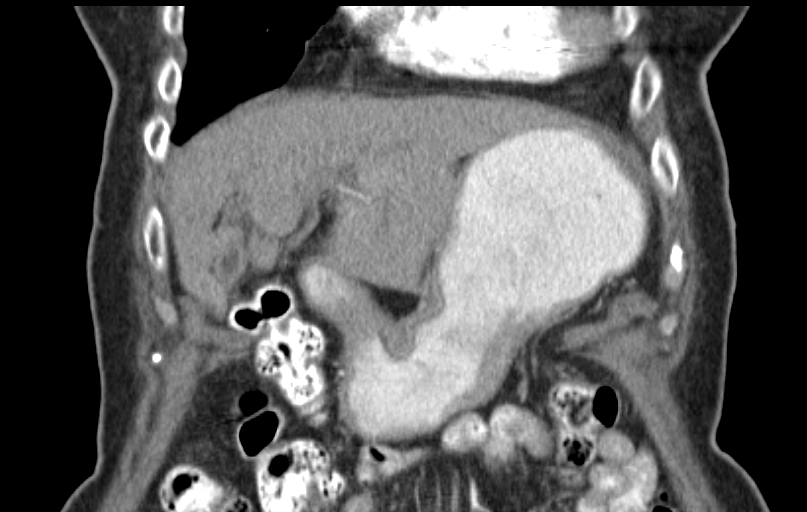
[im 39/78  soft-tissue]
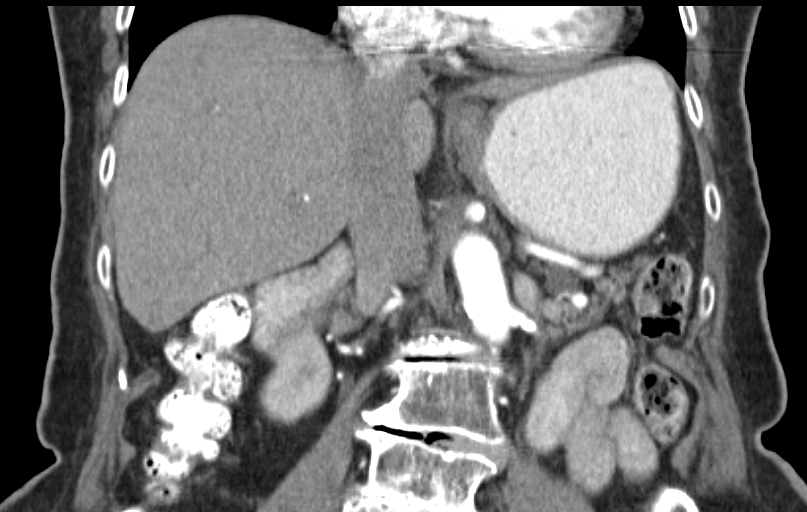
[im 39/78  bone]
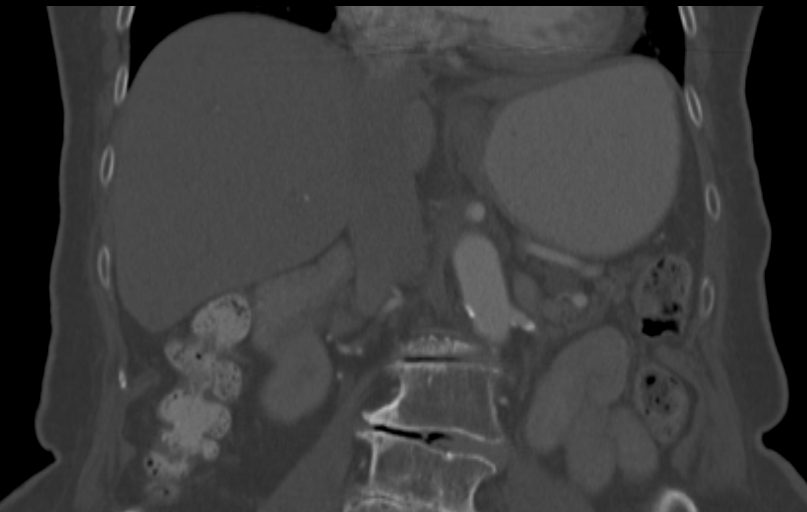
[im 58/78  soft-tissue]
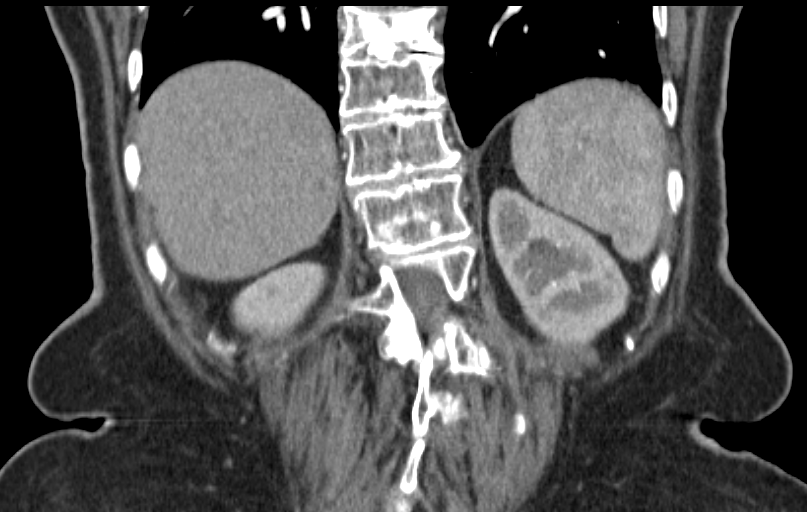

[10 of 46 positions shown; findings below may reference images not displayed]

FINDINGS: Lower chest: The lung bases are clear except for dependent
subpleural atelectasis. No worrisome pulmonary lesions or pleural
effusion. The heart is enlarged. No pericardial effusion. The distal
esophagus is grossly normal.

Hepatobiliary: No focal hepatic lesions or significant intrahepatic
biliary dilatation. The gallbladder is contracted. Normal caliber
common bile duct for age.

Pancreas: There is a 21.5 mm cystic lesion in the pancreatic tail as
demonstrated on the prior lumbar spine CT. No nodularity or
enhancement is demonstrated. This is most likely a benign
postinflammatory cyst. I do not think it knees any further imaging
evaluation or follow-up given the patient's age and it has benign
characteristics. The pancreatic head and body are unremarkable. No
ductal dilatation.

Spleen: Normal size.  No focal lesions.

Adrenals/Urinary Tract: The adrenal glands are normal.

Small left renal calculi and small renal cysts. No obstructing
ureteral calculi.

Stomach/Bowel: The stomach, duodenum, visualized small bowel and
visualized colon are unremarkable. No inflammatory changes, mass
lesions or obstructive findings.

Vascular/Lymphatic: Moderate to advanced atherosclerotic
calcifications involving the aorta and branch vessel ostia. No
aneurysm or dissection. The major venous structures are patent.

Other: No ascites or abdominal wall hernia. No subcutaneous lesions.

Musculoskeletal: Stable osteoporosis and vertebral augmentation
changes. Significant scoliosis. Small left-sided transverse process
fractures are again noted.
IMPRESSION: 1. Benign-appearing 21.5 mm cystic lesion in the pancreatic tail. No
worrisome CT imaging features such as nodularity or enhancement.
This is most likely a benign postinflammatory cysts. I do not think
any further imaging evaluation or follow-up is necessary.
2. No acute abdominal findings or lymphadenopathy.

## 2017-06-30 HISTORY — PX: OTHER SURGICAL HISTORY: SHX169

## 2017-07-09 LAB — CALR + JAK2 E12-15 + MPL (REFLEXED)

## 2017-07-09 LAB — JAK2 V617F, W REFLEX TO CALR/E12/MPL

## 2017-07-10 ENCOUNTER — Ambulatory Visit (INDEPENDENT_AMBULATORY_CARE_PROVIDER_SITE_OTHER): Payer: Medicare Other | Admitting: *Deleted

## 2017-07-10 DIAGNOSIS — I495 Sick sinus syndrome: Secondary | ICD-10-CM

## 2017-07-10 NOTE — Progress Notes (Signed)
Remote pacemaker transmission.   

## 2017-07-13 ENCOUNTER — Encounter: Payer: Self-pay | Admitting: Cardiology

## 2017-07-19 ENCOUNTER — Other Ambulatory Visit: Payer: Self-pay

## 2017-07-19 ENCOUNTER — Encounter (HOSPITAL_COMMUNITY): Payer: Medicare Other | Attending: Oncology | Admitting: Oncology

## 2017-07-19 ENCOUNTER — Encounter (HOSPITAL_COMMUNITY): Payer: Self-pay

## 2017-07-19 VITALS — BP 197/74 | HR 59 | Temp 97.9°F | Resp 16 | Wt 139.3 lb

## 2017-07-19 DIAGNOSIS — D61818 Other pancytopenia: Secondary | ICD-10-CM | POA: Diagnosis not present

## 2017-07-19 DIAGNOSIS — D649 Anemia, unspecified: Secondary | ICD-10-CM | POA: Diagnosis not present

## 2017-07-19 DIAGNOSIS — D72819 Decreased white blood cell count, unspecified: Secondary | ICD-10-CM | POA: Diagnosis not present

## 2017-07-19 NOTE — Progress Notes (Signed)
[facility] Initial Visit:  Patient Care Team: Redmond School, MD as PCP - General (Internal Medicine) Evans Lance, MD as Consulting Physician (Cardiology)  CHIEF COMPLAINTS/PURPOSE OF CONSULTATION:  Panyctopenia  HISTORY OF PRESENTING ILLNESS: Pamela [name] 83 y.o. female is here with her daughter in law because of pancytopenia.  Patient had routine blood work performed by her PCP on 05/31/2017 which demonstrated a WBC 2.2K, hemoglobin 11 g/dL, hematocrit 34.4%, platelet count 135K. Previous CBC from 09/21/2016 demonstrated WBC 4.1K, hemoglobin 11.5 g/dL, hematocrit 35.8%, platelet count 143K.  Patient states she feels tired.  She is has vision problems related to her macular degeneration.  She also has been having problems with uncontrolled hypertension.  She recently had hydralazine added to her medication list, however causes her dizziness.  She has chronic sleep problems.  She has neuropathy in her feet.  She denies any chest pain, shortness of breath, abdominal pain, nausea, vomiting, diarrhea.  She denies any recent infections.  INTERVAL HISTORY: Patient presents today with her daughter for review of her pancytopenia workup.  She states that overall there is been no changes since her last visit.  She has chronic fatigue.  She has occasional constipation and leg swelling.  She also has neuropathy in her feet.  Otherwise she denies any chest pain, shortness of breath, abdominal pain, nausea, vomiting, diarrhea, focal weakness.  Review of Systems - Oncology ROS as per HPI otherwise 12 point ROS is negative.  MEDICAL HISTORY: Past Medical History:  Diagnosis Date  . Coronary artery disease    stent in the LAD artery in 2002  . Gait disorder 03/05/2017  . HTN (hypertension) 08/30/2012  . Hyperlipidemia 08/30/2012  . ICD (implantable cardiac defibrillator) in place   . Macular degeneration   . Pacemaker 08/29/2012   st jude accent DR RF device, model number  [id number], serial number [id number], implanted 08/29/2012 for sinus bradycardia, runs of supraventricular tachycardia  last checked 10/23/2012  . Polyneuropathy in other diseases classified elsewhere ([place])   . Shingles 08/2016  . SSS (sick sinus syndrome) with PAT and marked bradycardia with 3 sec pauses  08/30/2012  . Status post placement of cardiac pacemaker. 08/29/12 St. Jude device 08/30/2012  . Venous insufficiency     SURGICAL HISTORY: Past Surgical History:  Procedure Laterality Date  . ABDOMINAL HYSTERECTOMY    . CARDIAC CATHETERIZATION  2006   3 stents placed  . CARDIAC CATHETERIZATION  06/2001   placement of BiodivYsio 2.5.10mm stent dilated to 2.32m in proximal left anterior descenting stenotic lesion  . CESAREAN SECTION    . COLONOSCOPY W/ POLYPECTOMY    . HIP ARTHROPLASTY Left 01/04/2014   Procedure: ARTHROPLASTY BIPOLAR HIP;  Surgeon: MMauri Pole MD;  Location: [facility]  Service: Orthopedics;  Laterality: Left;  . INSERT / REPLACE / REMOVE PACEMAKER  08/29/2012   st jude   . KIDNEY STONE SURGERY    . OPEN REDUCTION INTERNAL FIXATION (ORIF) DISTAL RADIAL FRACTURE Right 04/01/2016   Procedure: OPEN REDUCTION INTERNAL FIXATION (ORIF) RIGHT DISTAL RADIAL FRACTURE AND REPAIR;  Surgeon: FIran Planas MD;  [facility]  Service: Orthopedics;  Laterality: Right;  . PERMANENT PACEMAKER INSERTION N/A 08/29/2012   Procedure: PERMANENT PACEMAKER INSERTION;  Surgeon: MSanda Klein MD;  [facility];  Service: Cardiovascular;  Laterality: N/A;  . RADIOFREQUENCY ABLATION  08/25/2010   ablation of left greater saphenous vein  . ROTATOR CUFF REPAIR Left   . ROTATOR CUFF REPAIR Right   .  TOTAL HIP REVISION Left 07/24/2015   Procedure: ORIF LEFT PERIPROSTHETIC FEMUR FRACTURE; CONVERSION TO TOTAL HIP ARTHROPLASTY POSS. FEMORAL COMPONENT REVISION ;  Surgeon: Rod Can, MD;  Location: [facility];  Service: Orthopedics;  Laterality: Left;    SOCIAL HISTORY: Social History    Socioeconomic History  . Marital status: Widowed    Spouse name: Not on file  . Number of children: 2  . Years of education: Not on file  . Highest education level: Not on file  Social Needs  . Financial resource strain: Not on file  . Food insecurity - worry: Not on file  . Food insecurity - inability: Not on file  . Transportation needs - medical: Not on file  . Transportation needs - non-medical: Not on file  Occupational History    Employer: RETIRED  Tobacco Use  . Smoking status: Never Smoker  . Smokeless tobacco: Never Used  Substance and Sexual Activity  . Alcohol use: No    Alcohol/week: 0.0 oz  . Drug use: No  . Sexual activity: Not on file  Other Topics Concern  . Not on file  Social History Narrative   Patient drinks about 2 cups of caffeine daily.   Patient is right handed.    FAMILY HISTORY Family History  Problem Relation Age of Onset  . Heart disease Mother   . Congestive Heart Failure Mother   . Heart disease Father   . Heart attack Father   . Heart disease Brother     ALLERGIES:  is allergic to gabapentin.  MEDICATIONS:  Current Outpatient Medications  Medication Sig Dispense Refill  . acetaminophen (TYLENOL) 500 MG tablet Take 500 mg by mouth every 6 (six) hours as needed for mild pain or moderate pain.     Marland Kitchen aspirin EC 81 MG tablet Take 81 mg by mouth every morning.     . calcium-vitamin D (OSCAL WITH D) 500-200 MG-UNIT tablet Take 1 tablet by mouth daily.    . carvedilol (COREG) 12.5 MG tablet TAKE ONE AND ONE-HALF TABLETS TWICE DAILY 270 tablet 1  . Cholecalciferol (VITAMIN D-3 PO) Take 1 capsule by mouth daily.    Marland Kitchen docusate sodium (COLACE) 100 MG capsule Take 1 capsule (100 mg total) by mouth 2 (two) times daily. 10 capsule 0  . donepezil (ARICEPT) 10 MG tablet Take 1 tablet (10 mg total) by mouth at bedtime. (Patient taking differently: Take 5 mg by mouth at bedtime. ) 30 tablet 5  . folic acid (FOLVITE) 1 MG tablet Take 1 mg by mouth  daily.    . furosemide (LASIX) 20 MG tablet TAKE ONE TABLET BY MOUTH DAILY AS NEEDEDFOR EDEMA 30 tablet 2  . hydrALAZINE (APRESOLINE) 25 MG tablet Take 1 tablet by mouth 3 (three) times daily.  10  . lidocaine (LMX) 4 % cream Apply 1 application topically 2 (two) times daily as needed.    . Multiple Vitamins-Minerals (OCUVITE PO) Take 1 tablet by mouth 2 (two) times daily.    . nitroGLYCERIN (NITROSTAT) 0.4 MG SL tablet Place 1 tablet (0.4 mg total) under the tongue every 5 (five) minutes as needed for chest pain. 100 tablet 3  . Polyethyl Glycol-Propyl Glycol (SYSTANE OP) Apply 1 drop to eye daily as needed ([place]).     . ramipril (ALTACE) 5 MG capsule Take 1 capsule by mouth daily.  0   No current facility-administered medications for this visit.     PHYSICAL EXAMINATION:   Vitals:   07/19/17 1144  BP: (!) 197/74  Pulse: (!) 59  Resp: 16  Temp: 97.9 F (36.6 C)  SpO2: 99%    Filed Weights   07/19/17 1144  Weight: 139 lb 4.8 oz (63.2 kg)     Physical Exam Constitutional: Well-developed, well-nourished, and in no distress.   HENT:  Head: Normocephalic and atraumatic.  Mouth/Throat: No oropharyngeal exudate. Mucosa moist. Eyes: Pupils are equal, round, and reactive to light. Conjunctivae are normal. No scleral icterus.  Neck: Normal range of motion. Neck supple. No JVD present.  Cardiovascular: Normal rate, regular rhythm and normal heart sounds.  Exam reveals no gallop and no friction rub.   No murmur heard. Pulmonary/Chest: Effort normal and breath sounds normal. No respiratory distress. No wheezes.No rales.  Abdominal: Soft. Bowel sounds are normal. No distension. There is no tenderness. There is no guarding.  Musculoskeletal: No edema or tenderness.  Lymphadenopathy:    No cervical or supraclavicular adenopathy.  Neurological: Alert and oriented to person, place, and time. No cranial nerve deficit.  Skin: Skin is warm and dry. No rash noted. No erythema. No pallor.   Psychiatric: Affect and judgment normal.     LABORATORY DATA: I have personally reviewed the data as listed:  Lab on 06/26/2017  Component Date Value Ref Range Status  . WBC 06/26/2017 3.6* 4.0 - 10.5 K/uL Final  . RBC 06/26/2017 3.92  3.87 - 5.11 MIL/uL Final  . Hemoglobin 06/26/2017 11.6* 12.0 - 15.0 g/dL Final  . HCT 06/26/2017 38.3  36.0 - 46.0 % Final  . MCV 06/26/2017 97.7  78.0 - 100.0 fL Final  . MCH 06/26/2017 29.6  26.0 - 34.0 pg Final  . MCHC 06/26/2017 30.3  30.0 - 36.0 g/dL Final  . RDW 06/26/2017 14.9  11.5 - 15.5 % Final  . Platelets 06/26/2017 153  150 - 400 K/uL Final  . Neutrophils Relative % 06/26/2017 52  % Final  . Neutro Abs 06/26/2017 1.9  1.7 - 7.7 K/uL Final  . Lymphocytes Relative 06/26/2017 33  % Final  . Lymphs Abs 06/26/2017 1.2  0.7 - 4.0 K/uL Final  . Monocytes Relative 06/26/2017 11  % Final  . Monocytes Absolute 06/26/2017 0.4  0.1 - 1.0 K/uL Final  . Eosinophils Relative 06/26/2017 3  % Final  . Eosinophils Absolute 06/26/2017 0.1  0.0 - 0.7 K/uL Final  . Basophils Relative 06/26/2017 1  % Final  . Basophils Absolute 06/26/2017 0.0  0.0 - 0.1 K/uL Final  . Sodium 06/26/2017 140  135 - 145 mmol/L Final  . Potassium 06/26/2017 3.5  3.5 - 5.1 mmol/L Final  . Chloride 06/26/2017 104  101 - 111 mmol/L Final  . CO2 06/26/2017 29  22 - 32 mmol/L Final  . Glucose, Bld 06/26/2017 115* 65 - 99 mg/dL Final  . BUN 06/26/2017 21* 6 - 20 mg/dL Final  . Creatinine, Ser 06/26/2017 0.89  0.44 - 1.00 mg/dL Final  . Calcium 06/26/2017 9.9  8.9 - 10.3 mg/dL Final  . Total Protein 06/26/2017 6.5  6.5 - 8.1 g/dL Final  . Albumin 06/26/2017 4.2  3.5 - 5.0 g/dL Final  . AST 06/26/2017 23  15 - 41 U/L Final  . ALT 06/26/2017 15  14 - 54 U/L Final  . Alkaline Phosphatase 06/26/2017 48  38 - 126 U/L Final  . Total Bilirubin 06/26/2017 0.8  0.3 - 1.2 mg/dL Final  . GFR calc non Af Amer 06/26/2017 54* >60 mL/min Final  . GFR calc Af Amer 06/26/2017 >60  >  60 mL/min  Final   Comment: (NOTE) The eGFR has been calculated using the CKD EPI equation. This calculation has not been validated in all clinical situations. eGFR's persistently <60 mL/min signify possible Chronic Kidney Disease.   . Anion gap 06/26/2017 7  5 - 15 Final  . Iron 06/26/2017 70  28 - 170 ug/dL Final  . TIBC 06/26/2017 353  250 - 450 ug/dL Final  . Saturation Ratios 06/26/2017 20  10.4 - 31.8 % Final  . UIBC 06/26/2017 283  ug/dL Final   Performed at [facility], [place] [address], [place], [place] [address]  . Ferritin 06/26/2017 71  11 - 307 ng/mL Final   Performed at [facility] [address] [place], [place] [address]  . Vitamin B-12 06/26/2017 818  180 - 914 pg/mL Final   Comment: (NOTE) This assay is not validated for testing neonatal or myeloproliferative syndrome specimens for Vitamin B12 levels. Performed at [facility], [place] [address], [place], [place] [address]   . Folate 06/26/2017 87.0  >5.9 ng/mL Final   Comment: RESULTS CONFIRMED BY MANUAL DILUTION Performed at [facility], [place] [address], [place], [place] [address]   . Erythropoietin 06/26/2017 12.8  2.6 - 18.5 mIU/mL Final   Comment: (NOTE) [facility] UniCel DxI 800 Immunoassay System Performed At: [facility] [place], [place] [id number] Rush Farmer MD CV:[id number]   . Retic Ct Pct 06/26/2017 1.0  0.4 - 3.1 % Final  . RBC. 06/26/2017 3.92  3.87 - 5.11 MIL/uL Final  . Retic Count, Absolute 06/26/2017 39.2  19.0 - 186.0 K/uL Final  . Total Protein ELP 06/26/2017 6.3  6.0 - 8.5 g/dL Final  . Albumin ELP 06/26/2017 3.8  2.9 - 4.4 g/dL Final  . Alpha-1-Globulin 06/26/2017 0.3  0.0 - 0.4 g/dL Final  . Alpha-2-Globulin 06/26/2017 0.7  0.4 - 1.0 g/dL Final  . Beta Globulin 06/26/2017 1.0  0.7 - 1.3 g/dL Final  . Gamma Globulin 06/26/2017 0.5  0.4 - 1.8 g/dL Final  . M-Spike, % 06/26/2017 Not Observed  Not Observed g/dL Final  . SPE Interp. 06/26/2017  Comment   Final   Comment: (NOTE) The SPE pattern appears essentially unremarkable. Evidence of monoclonal protein is not apparent. Performed At: [facility] [place], [place] [id number] Rush Farmer MD EU:[id number]   . Comment 06/26/2017 Comment   Final   Comment: (NOTE) Protein electrophoresis scan will follow via computer, mail, or courier delivery.   Marland Kitchen GLOBULIN, TOTAL 06/26/2017 2.5  2.2 - 3.9 g/dL Corrected  . A/G Ratio 06/26/2017 1.5  0.7 - 1.7 Corrected  . Total Protein ELP 06/26/2017 6.6  6.0 - 8.5 g/dL Final  . IgG (Immunoglobin G), Serum 06/26/2017 536* 700 - 1,600 mg/dL Final  . IgA 06/26/2017 138  64 - 422 mg/dL Final  . IgM (Immunoglobulin M), Srm 06/26/2017 40  26 - 217 mg/dL Final   Comment: (NOTE) Performed At: [facility] [address] [place], [place] [id number] Rush Farmer MD PJ:[id number]   . Immunofixation Result, Serum 06/26/2017 Comment   Corrected   An apparent normal immunofixation pattern.  Marland Kitchen JAK2 GenotypR 06/26/2017 Comment   Final   Comment: (NOTE) Result: NEGATIVE for the JAK2 V617F mutation. Interpretation:  The G to T nucleotide change encoding the V617F mutation was not detected.  This result does not rule out the presence of the JAK2 mutation at a level below the sensitivity of  detection of this assay, or the presence of other mutations within JAK2 not detected by this assay.  This result does not rule out a diagnosis of polycythemia vera, essential thrombocythemia or idiopathic myelofibrosis as the V617F mutation is not detected in all patients with these disorders.   Marland Kitchen BACKGROUND: 06/26/2017 Comment   Final   Comment: (NOTE) JAK2 is a cytoplasmic tyrosine kinase with a key role in signal transduction from multiple hematopoietic growth factor receptors. A point mutation within exon 14 of the JAK2 gene ([id number]) encoding a valine to phenylalanine substitution at position 617 of the JAK2 protein (V617F)  has been identified in most patients with polycythemia vera, and in about half of those with either essential thrombocythemia or idiopathic myelofibrosis. The V617F has also been detected, although infrequently, in other myeloid disorders such as chronic myelomonocytic leukemia and chronic neutrophilic luekemia. V617F is an acquired mutation that alters a highly conserved valine present in the negative regulatory JH2 domain of the JAK2 protein and is predicted to dysregulate kinase activity. Methodology: Total genomic DNA was extracted and subjected to TaqMan real-time PCR amplification/detection. Two amplification products per sample were monitored by real-time PCR using primers/probes s                          pecific to JAK2 wild type (WT) and JAK2 mutant V617F. The ABI7900 Absolute Quantitation software will compare the patient specimen valuse to the standard curves and generate percent values for wild type and mutant type. In vitro studies have indicated that this assay has an analytical sensitivity of 1%. References: Baxter EJ, Scott Phineas Real, et al. Acquired mutation of the tyrosine kinase JAK2 in human myeloproliferative disorders. Lancet. 2005 Mar 19-25; 365(9464):1054-1061. Alfonso Ramus Couedic JP. A unique clonal JAK2 mutation leading to constitutive signaling causes polycythaemia vera. Nature. 2005 Apr 28; 434(7037):1144-1148. Kralovics R, Passamonti F, Buser AS, et al. A gain-of-function mutation of JAK2 in myeloproliferative disorders. N Engl J Med. 2005 Apr 28; 352(17):1779-1790.   . Director Review, JAK2 06/26/2017 Comment   Final   Comment: (NOTE) Constance Goltz, PhD, [facility]               Director, [place] for [place], [place]               [phone] This test was developed and its performance characteristics determined by [facility]. It has not  been cleared or approved by the Food and Drug Administration.   Marland Kitchen REFLEX: 06/26/2017 Comment   Final   Comment: (NOTE) Reflex to CALR Mutation Analysis, JAK2 Exon 12-15 Mutation Analysis, and MPL Mutation Analysis is indicated.   Marland Kitchen Extraction 06/26/2017 Completed   Corrected   Comment: (NOTE) Performed At: [facility] [address] [place], [place] [id number] Nechama Guard MD QP:[id number] Performed At: [facility] [place], [place] [id number] Nechama Guard MD PJ:[id number]   . CALR Mutation Detection Result 06/26/2017 Comment   Final   Comment: (NOTE) NEGATIVE No insertions or deletions were detected within the analyzed region of the calreticulin (CALR) gene. A negative result does not entirely exclude the possibility of a clonal population  carrying CALR gene mutations that are not covered by this assay. Results should be interpreted in conjunction with clinical and laboratory findings for the most accurate interpretation.   . Background: 06/26/2017 Comment   Final   Comment: (NOTE) The calcium-binding endoplasmic reticulin chaperone protein, calreticulin (CALR), is somatically mutated in approximately 70% of patients with JAK2-negative essential thrombocythemia (ET) and 60- 88% of patients with JAK2-negative primary myelofibrosis(PMF). Only a minority of patients (approximately 8%) with myelodysplasia have mutations in  CALR gene. CALR mutations are rarely detected in patients with de novo acute myeloid leukemia, chronic myelogenous leukemia, lymphoid leukemia, or solid tumors. CALR mutations are not detected in polycythemia and generally appear to be mutually exclusive with JAK2 mutations and MPL mutations. The majority of mutational changes involve a variety of insertion or deletion mutations in exon 9 of the calreticulin gene: approximately 53% of all CALR mutations are a 52 bp deletion (type-1) while the second most prevalent  mutation (approximately 32%) contains a 5 bp insertion (type-2). Other mutations (non-type 1 or type 2) are seen                           in a small minority of cases. CALR mutations in PMF tend to be associated with a favorable prognosis compared to JAK2 V617F mutations, whereas primary myelofibrosis negative for CALR, JAK2 V617F and MPL mutations (so-called triple negative) is associated with a poor prognosis and shorter survival. The detection of a CALR gene mutation aids in the specific diagnosis of a myeloproliferative neoplasm, and help distinguish this clonal disease from a benign reactive process.   . Methodology: 06/26/2017 Comment   Final   Comment: (NOTE) Genomic DNA was isolated from the provided specimen. Polymerase chain reaction (PCR) of exon 9 of the CALR gene was performed with specific fluorescent-labeled primers, and the PCR product was analyzed by capillary gel electrophoresis to determine the size of the PCR products. This PCR assay is capable of detecting a mutant cell population with a sensitivity of 5 mutant cells per 100 normal cells. A negative result does not exclude the presence of a myeloproliferative disorder or other neoplastic process. This test was developed and its performance characteristics determined by [facility]. It has not been cleared or approved by the Food and Drug Administration. The FDA has determined that such clearance or approval is not necessary.   . References: 06/26/2017 Comment   Final   Comment: (NOTE) 1. Klampfel, T. et al. (2013) Somatic mutations of calreticulin in   myeloproliferative neoplasms. New Engl. J. Med. 335:4562-5638. 2. Haynes Kerns et al. (2013) Somatic CALR mutations in   myeloproliferative neoplasms with nonmutated JAK2. [place] Engl. J.   Med. [phone].   Marland Kitchen Director Review 06/26/2017 Comment   Final   Comment: (NOTE) Constance Goltz, PhD, [facility]               Director, [place]  for [place], [place]   . JAK2 Exons 12-15 Mut Det PCR: 06/26/2017 Comment   Final   Comment: (NOTE) NEGATIVE JAK2 mutations were not detected in exons 12, 13,  14 and 15. This result does not rule out the presence of JAK2 mutation at a level below the detection sensitivity of this assay, the presence of other mutations outside the analyzed region of the JAK2 gene, or the presence of a myeloproliferative or other neoplasm. Result must be correlated with other clinical data for the most accurate diagnosis.   . Indications 06/26/2017 [address]   Final  . Specimen Type 06/26/2017 Comment   Final   No specimen type provided.  Marland Kitchen BACKGROUND: 06/26/2017 Comment   Final   Comment: (NOTE) JAK2 V617F mutation is detected in patients with polycythemia vera (95%), essential thrombocythemia (50%) and primary myelofibrosis (50%). A small percentage of JAK2 mutation positive patients (3.3%) contain other non-V617F mutations within exons 12 to 15. The detection of a JAK2 gene mutation aids in the specific diagnosis of a myeloproliferative neoplasm, and help distinguish this clonal disease from a benign reactive process.   . Method 06/26/2017 Comment   Final   Comment: (NOTE) Total RNA was purified from the provided specimen. The JAK2 gene region covering exons 12 to 15 was subjected to reverse- transcription coupled PCR amplification, and bi-directional sequencing to identify sequence variations. This assay has a sensitivity to detect approximately 15% population of cells containing the JAK2 mutations in a background of non-mutant cells. This test was developed and its performance characteristics determined by [facility]. It has not been cleared or approved by the Food and Drug Administration.   . References 06/26/2017 Comment   Final   Comment: (NOTE) Algasham, N. et al. Detection of mutations in  JAK2 exons 12-15 by Sanger sequencing. Int J Lab Hemato. 2015, 38:34-41. Joelene Millin al. Mutation profile of JAK2 transcripts in patients with chronic myeloproliferative neoplasias. J Mol Diagn. 2009, 11:49-53.   Marland Kitchen DIRECTOR REVIEW: 06/26/2017 Comment   Final   Comment: (NOTE) Loni Muse, PhD  Director, [place] [facility]ology  [place], [place] [address]  [phone]   . MPL MUTATION ANALYSIS RESULT: 06/26/2017 Comment   Final   Comment: (NOTE) No MPL mutation was identified in the provided specimen of this individual. Results should be interpreted in conjunction with clinical and other laboratory findings for the most accurate interpretation.   Marland Kitchen BACKGROUND: 06/26/2017 Comment   Final   Comment: (NOTE) MPL (myeloproliferative leukemia virus oncogene homology) belongs to the hematopoietin superfamily and enables its ligand thrombopoietin to facilitate both global hematopoiesis and megakaryocyte growth and differentiation. MPL W515 mutations are present in patients with primary myelofibrosis (PMF) and essential thrombocythemia (ET) at a frequency of approximately 5% and 1% respectively. The S505 mutation is detected in patients with hereditary thrombocythemia.   Marland Kitchen METHODOLOGY: 06/26/2017 Comment   Final   Comment: (NOTE) Genomic DNA was purified from the provided specimen. MPL gene region covering the S505N and W515L/K mutations were subjected to PCR amplification and bi-directional sequencing in duplicate to identify sequence variations. This assay has a sensitivity to detect approximately 20-25% population of cells containing the MPL mutations in a background of non-mutant cells. This assay will not detect the mutation below the sensitivity of this assay. Molecular- based testing is highly accurate, but as in any laboratory test, rare diagnostic errors may occur.   Marland Kitchen REFERENCES: 06/26/2017 Comment   Final   Comment:  (NOTE) 1. Pardanani AD, et al. (2006). MPL515 mutations in   myeloproliferative and other myeloid disorders: a study   of 1182 patients. Blood 544:9201-0071. 2. Andre Lefort and Levine RL. (2008).  JAK2 and MPL   mutations in myeloproliferative neoplasms: discovery and   science. Leukemia 22:1813-1817. 3. Juline Patch, et al. (2009). Evidence for a founder effect   of the MPL-S505N mutation in eight [place] pedigrees with   hereditary thrombocythemia. Haematologica 94(10):1368-   9030.   Marland Kitchen DIRECTOR REVIEW: 06/26/2017 Comment   Final   Comment: (NOTE) Loni Muse, PhD  Director, [place] for Molecular Biology and [place], [place] [address]  [phone] This test was developed and its performance characteristics determined by [facility]. It has not been cleared or approved by the Food and Drug Administration.   . Extraction 06/26/2017 Comment   Final   Comment: (NOTE) This sample has been received and DNA extraction has been performed. Performed At: [facility] [address] [place], [place] [id number] Nechama Guard MD HT:[id number] Performed At: [facility] [place], [place] [id number] Nechama Guard MD RC:[id number]     RADIOGRAPHIC STUDIES: I have personally reviewed the radiological images as listed and agree with the findings in the report  No results found.  ASSESSMENT: Mild Bicytopenia with mild anemia and mild leukopenia.  PLAN: -I have reviewed patient's labs with her in detail.  -SPEP/IFE was negative for MM. JAK2 with reflex to MPL/CALR/exon 12 was negative therefore its unlikely that she has myelofibrosis or any other myeloproliferative disorders. -B12, folate, iron were all WNL. -I believe her mild cytopenias is likely due to mild decreased bone marrow production due to her advanced age.  -Continue observation of her blood counts at this time. -RTC in 6 months for follow up with  labs below.  Orders Placed This Encounter  Procedures  . CBC with Differential    Standing Status:   Future    Standing Expiration Date:   07/19/2018  . Comprehensive metabolic panel    Standing Status:   Future    Standing Expiration Date:   07/19/2018   -Patient very hypertensive today. Advised her to continue monitoring her BP and if she should start to develop symptoms, she is to go to the ED. She verbalized understanding.   All questions were answered. The patient knows to call the clinic with any problems, questions or concerns.  This note was electronically signed.    Twana First, MD  07/19/2017 12:04 PM

## 2017-07-19 NOTE — Patient Instructions (Signed)
[facility] at [facility] Discharge Instructions  RECOMMENDATIONS MADE BY THE CONSULTANT AND ANY TEST RESULTS WILL BE SENT TO YOUR REFERRING PHYSICIAN.  You were seen today by Dr. Louise Zhou    Thank you for choosing [facility] at [facility] to provide your oncology and hematology care.  To afford each patient quality time with our provider, please arrive at least 15 minutes before your scheduled appointment time.    If you have a lab appointment with the Cancer Center please come in thru the  Main Entrance and check in at the main information desk  You need to re-schedule your appointment should you arrive 10 or more minutes late.  We strive to give you quality time with our providers, and arriving late affects you and other patients whose appointments are after yours.  Also, if you no show three or more times for appointments you may be dismissed from the clinic at the providers discretion.     Again, thank you for choosing [facility].  Our hope is that these requests will decrease the amount of time that you wait before being seen by our physicians.       _____________________________________________________________  Should you have questions after your visit to  Cancer Center, please contact our office at [phone] between the hours of 8:30 a.m. and 4:30 p.m.  Voicemails left after 4:30 p.m. will not be returned until the following business day.  For prescription refill requests, have your pharmacy contact our office.       Resources For Cancer Patients and their Caregivers ? American Cancer Society: Can assist with transportation, wigs, general needs, runs Look Good Feel Better.        1-888-227-6333 ? Cancer Care: Provides financial assistance, online support groups, medication/co-pay assistance.  1-800-813-HOPE (4673) ? [facility] Assists [place] cancer patients and their  families through emotional , educational and financial support.  336-427-4357 ? [place] DSS Where to apply for food stamps, Medicaid and utility assistance. 336-342-1394 ? [facility]: Transportation to medical appointments. 336-347-2287 ? Social Security Administration: May apply for disability if have a Stage IV cancer. 336-342-7796 1-800-772-1213 ? [place] Aging, Disability and Transit Services: Assists with nutrition, care and transit needs. 336-349-2343  Cancer Center Support Programs: @10RELATIVEDAYS@ > Cancer Support Group  2nd Tuesday of the month 1pm-2pm, Journey Room  > Creative Journey  3rd Tuesday of the month 1130am-1pm, Journey Room  > Look Good Feel Better  1st Wednesday of the month 10am-12 noon, Journey Room (Call American Cancer Society to register 1-800-395-5775)    

## 2017-08-02 LAB — CUP PACEART REMOTE DEVICE CHECK
Battery Remaining Longevity: 140 mo
Battery Remaining Percentage: 95.5 %
Battery Voltage: 2.99 V
Brady Statistic AS VS Percent: 74 %
Brady Statistic RA Percent Paced: 25 %
Implantable Lead Implant Date: 20140414
Implantable Lead Implant Date: 20140414
Implantable Lead Location: 753860
Implantable Pulse Generator Implant Date: 20140414
Lead Channel Pacing Threshold Amplitude: 0.375 V
Lead Channel Pacing Threshold Pulse Width: 0.4 ms
Lead Channel Pacing Threshold Pulse Width: 0.4 ms
Lead Channel Sensing Intrinsic Amplitude: 2.3 mV
Lead Channel Setting Pacing Amplitude: 0.875
MDC IDC LEAD LOCATION: 753859
MDC IDC MSMT LEADCHNL RA IMPEDANCE VALUE: 360 Ohm
MDC IDC MSMT LEADCHNL RV IMPEDANCE VALUE: 390 Ohm
MDC IDC MSMT LEADCHNL RV PACING THRESHOLD AMPLITUDE: 0.625 V
MDC IDC MSMT LEADCHNL RV SENSING INTR AMPL: 9.6 mV
MDC IDC PG SERIAL: 7437302
MDC IDC SESS DTM: 20181211070818
MDC IDC SET LEADCHNL RA PACING AMPLITUDE: 1.375
MDC IDC SET LEADCHNL RV PACING PULSEWIDTH: 0.4 ms
MDC IDC SET LEADCHNL RV SENSING SENSITIVITY: 2 mV
MDC IDC STAT BRADY AP VP PERCENT: 1 %
MDC IDC STAT BRADY AP VS PERCENT: 25 %
MDC IDC STAT BRADY AS VP PERCENT: 1 %
MDC IDC STAT BRADY RV PERCENT PACED: 1 %

## 2017-08-15 DIAGNOSIS — F0390 Unspecified dementia without behavioral disturbance: Secondary | ICD-10-CM | POA: Diagnosis not present

## 2017-08-15 DIAGNOSIS — N183 Chronic kidney disease, stage 3 (moderate): Secondary | ICD-10-CM | POA: Diagnosis not present

## 2017-08-15 DIAGNOSIS — I10 Essential (primary) hypertension: Secondary | ICD-10-CM | POA: Diagnosis not present

## 2017-08-23 ENCOUNTER — Other Ambulatory Visit: Payer: Self-pay | Admitting: Internal Medicine

## 2017-08-28 ENCOUNTER — Other Ambulatory Visit (HOSPITAL_COMMUNITY): Payer: Self-pay | Admitting: Nephrology

## 2017-08-28 ENCOUNTER — Encounter (HOSPITAL_COMMUNITY): Payer: Self-pay

## 2017-08-28 DIAGNOSIS — N183 Chronic kidney disease, stage 3 unspecified: Secondary | ICD-10-CM

## 2017-09-07 ENCOUNTER — Other Ambulatory Visit (HOSPITAL_COMMUNITY): Payer: Medicare Other

## 2017-09-07 ENCOUNTER — Ambulatory Visit (HOSPITAL_COMMUNITY): Admission: RE | Admit: 2017-09-07 | Payer: Medicare Other | Source: Ambulatory Visit

## 2017-09-10 ENCOUNTER — Ambulatory Visit: Payer: Medicare Other | Admitting: Adult Health

## 2017-09-10 ENCOUNTER — Encounter: Payer: Self-pay | Admitting: Adult Health

## 2017-09-10 VITALS — BP 163/71 | HR 59 | Ht 66.5 in | Wt 137.0 lb

## 2017-09-10 DIAGNOSIS — R413 Other amnesia: Secondary | ICD-10-CM | POA: Diagnosis not present

## 2017-09-10 DIAGNOSIS — G609 Hereditary and idiopathic neuropathy, unspecified: Secondary | ICD-10-CM

## 2017-09-10 NOTE — Patient Instructions (Signed)
Your Plan:  Continue Aricept for memory- score is stable Continue Lidocaine gel for neuropathy If your symptoms worsen or you develop new symptoms please let us know.   Thank you for coming to see us at [facility]. I hope we have been able to provide you high quality care today.  You may receive a patient satisfaction survey over the next few weeks. We would appreciate your feedback and comments so that we may continue to improve ourselves and the health of our patients.

## 2017-09-10 NOTE — Progress Notes (Signed)
PATIENT: Pamela [name] DOB: [birth year]  REASON FOR VISIT: follow up HISTORY FROM: patient  HISTORY OF PRESENT ILLNESS: Today 09/10/17 Pamela [name] is a 84 year old female with a history of peripheral neuropathy and memory disturbance.  She returns today for follow-up.  She reports that the lidocaine gel continues to work well for her neuropathy.  She denies any changes with her gait or balance.  Denies any falls.  She reports that the discomfort does not interrupt her sleep.  She feels that her memory has remained stable.  She lives at home alone.  She is able to complete all ADLs independently.  She does not operate a motor vehicle.  Reports good appetite.  Denies any significant changes in mood or behavior.  She continues on Aricept 10 mg at bedtime.  She returns today for an evaluation.   HISTORY Pamela [name] is a 84 year old right-handed white female with a history of a peripheral neuropathy associated with a gait disorder. The patient has mild burning sensation sometimes at nighttime, she uses topical lidocaine, oral gabapentin resulted in dizziness and gait instability. The patient feels better off of the medication. The patient claims that she usually sleeps fairly well. The discomfort in the feet during the daytime is minimal. She also has reported some troubles with memory that dates back more than 6 months, her daughter has been helping her with her medications and appointments, she does not operate a motor vehicle. The patient has macular degeneration and she has visual problems, she uses a walker to ambulate in the house and a cane to walk outside the house. She is still living in her own home, she has someone coming in for several hours each day to help her with bathing, cooking for her, and to do housework. The patient reports no falls since last seen.   REVIEW OF SYSTEMS: Out of a complete 14 system review of symptoms, the patient complains only of the following symptoms, and  all other reviewed systems are negative.  See HPI  ALLERGIES: Allergies  Allergen Reactions  . Gabapentin Other (See Comments)    Blurred vision, dizziness     HOME MEDICATIONS: Outpatient Medications Prior to Visit  Medication Sig Dispense Refill  . acetaminophen (TYLENOL) 500 MG tablet Take 500 mg by mouth every 6 (six) hours as needed for mild pain or moderate pain.     Marland Kitchen aspirin EC 81 MG tablet Take 81 mg by mouth every morning.     . calcium-vitamin D (OSCAL WITH D) 500-200 MG-UNIT tablet Take 1 tablet by mouth daily.    . carvedilol (COREG) 12.5 MG tablet TAKE ONE AND ONE-HALF TABLETS TWICE DAILY 270 tablet 1  . Cholecalciferol (VITAMIN D-3 PO) Take 1 capsule by mouth daily.    Marland Kitchen docusate sodium (COLACE) 100 MG capsule Take 1 capsule (100 mg total) by mouth 2 (two) times daily. 10 capsule 0  . donepezil (ARICEPT) 10 MG tablet Take 1 tablet (10 mg total) by mouth at bedtime. (Patient taking differently: Take 5 mg by mouth at bedtime. ) 30 tablet 5  . folic acid (FOLVITE) 1 MG tablet Take 1 mg by mouth daily.    . hydrALAZINE (APRESOLINE) 25 MG tablet Take 1 tablet by mouth 3 (three) times daily.  10  . lidocaine (LMX) 4 % cream Apply 1 application topically 2 (two) times daily as needed.    . Multiple Vitamins-Minerals (OCUVITE PO) Take 1 tablet by mouth 2 (two) times daily.    Marland Kitchen  nitroGLYCERIN (NITROSTAT) 0.4 MG SL tablet Place 1 tablet (0.4 mg total) under the tongue every 5 (five) minutes as needed for chest pain. 100 tablet 3  . Polyethyl Glycol-Propyl Glycol (SYSTANE OP) Apply 1 drop to eye daily as needed (Dry Eyes).     . ramipril (ALTACE) 5 MG capsule TAKE ONE (1) CAPSULE EACH DAY 90 capsule 3  . furosemide (LASIX) 20 MG tablet TAKE ONE TABLET BY MOUTH DAILY AS NEEDEDFOR EDEMA (Patient not taking: Reported on 09/10/2017) 30 tablet 2   No facility-administered medications prior to visit.     PAST MEDICAL HISTORY: Past Medical History:  Diagnosis Date  . Coronary artery  disease    stent in the LAD artery in 2002  . Gait disorder 03/05/2017  . HTN (hypertension) 08/30/2012  . Hyperlipidemia 08/30/2012  . ICD (implantable cardiac defibrillator) in place   . Macular degeneration   . Pacemaker 08/29/2012   st jude accent DR RF device, model number [id number], serial number [id number], implanted 08/29/2012 for sinus bradycardia, runs of supraventricular tachycardia  last checked 10/23/2012  . Polyneuropathy in other diseases classified elsewhere (HCC)   . Shingles 08/2016  . SSS (sick sinus syndrome) with PAT and marked bradycardia with 3 sec pauses  08/30/2012  . Status post placement of cardiac pacemaker. 08/29/12 St. Jude device 08/30/2012  . Venous insufficiency     PAST SURGICAL HISTORY: Past Surgical History:  Procedure Laterality Date  . ABDOMINAL HYSTERECTOMY    . CARDIAC CATHETERIZATION  2006   3 stents placed  . CARDIAC CATHETERIZATION  06/2001   placement of BiodivYsio 2.5.10mm stent dilated to 2.3675mm in proximal left anterior descenting stenotic lesion  . CESAREAN SECTION    . COLONOSCOPY W/ POLYPECTOMY    . HIP ARTHROPLASTY Left 01/04/2014   Procedure: ARTHROPLASTY BIPOLAR HIP;  Surgeon: Shelda PalMatthew D Olin, MD;  [facility];  Service: Orthopedics;  Laterality: Left;  . INSERT / REPLACE / REMOVE PACEMAKER  08/29/2012   st jude   . KIDNEY STONE SURGERY    . OPEN REDUCTION INTERNAL FIXATION (ORIF) DISTAL RADIAL FRACTURE Right 04/01/2016   Procedure: OPEN REDUCTION INTERNAL FIXATION (ORIF) RIGHT DISTAL RADIAL FRACTURE AND REPAIR;  Surgeon: Bradly BienenstockFred Ortmann, MD;  Location: [facility] OR;  Service: Orthopedics;  Laterality: Right;  . PERMANENT PACEMAKER INSERTION N/A 08/29/2012   Procedure: PERMANENT PACEMAKER INSERTION;  Surgeon: Thurmon FairMihai Croitoru, MD;  Location: [facility] CATH LAB;  Service: Cardiovascular;  Laterality: N/A;  . RADIOFREQUENCY ABLATION  08/25/2010   ablation of left greater saphenous vein  . ROTATOR CUFF REPAIR Left   . ROTATOR CUFF REPAIR Right   . TOTAL HIP  REVISION Left 07/24/2015   Procedure: ORIF LEFT PERIPROSTHETIC FEMUR FRACTURE; CONVERSION TO TOTAL HIP ARTHROPLASTY POSS. FEMORAL COMPONENT REVISION ;  Surgeon: Samson FredericBrian Swinteck, MD;  [facility];  Service: Orthopedics;  Laterality: Left;    FAMILY HISTORY: Family History  Problem Relation Age of Onset  . Heart disease Mother   . Congestive Heart Failure Mother   . Heart disease Father   . Heart attack Father   . Heart disease Brother     SOCIAL HISTORY: Social History   Socioeconomic History  . Marital status: Widowed    Spouse name: Not on file  . Number of children: 2  . Years of education: Not on file  . Highest education level: Not on file  Social Needs  . Financial resource strain: Not on file  . Food insecurity - worry: Not on file  .  Food insecurity - inability: Not on file  . Transportation needs - medical: Not on file  . Transportation needs - non-medical: Not on file  Occupational History    Employer: RETIRED  Tobacco Use  . Smoking status: Never Smoker  . Smokeless tobacco: Never Used  Substance and Sexual Activity  . Alcohol use: No    Alcohol/week: 0.0 oz  . Drug use: No  . Sexual activity: Not on file  Other Topics Concern  . Not on file  Social History Narrative   Patient drinks about 2 cups of caffeine daily.   Patient is right handed.      PHYSICAL EXAM  Vitals:   09/10/17 1038  BP: (!) 163/71  Pulse: (!) 59  Weight: 137 lb (62.1 kg)  Height: 5' 6.5" (1.689 m)   Body mass index is 21.78 kg/m.   MMSE - Mini Mental State Exam 09/10/2017 03/05/2017  Orientation to time 3 3  Orientation to Place 5 5  Registration 3 3  Attention/ Calculation 3 1  Recall 2 0  Language- name 2 objects 2 2  Language- repeat 0 1  Language- follow 3 step command 3 2  Language- read & follow direction 1 1  Write a sentence 1 0  Copy design 0 0  Total score 23 18    Generalized: Well developed, in no acute distress Ms. Glasby is a  Neurological  examination  Mentation: Alert oriented to time, place, history taking. Follows all commands speech and language fluent Cranial nerve II-XII: Pupils were equal round reactive to light. Extraocular movements were full, visual field were full on confrontational test. Facial sensation and strength were normal. Uvula tongue midline. Head turning and shoulder shrug  were normal and symmetric. Motor: The motor testing reveals 5 over 5 strength of all 4 extremities. Good symmetric motor tone is noted throughout.  Sensory: Sensory testing is intact to soft touch on all 4 extremities. No evidence of extinction is noted.  Coordination: Cerebellar testing reveals good finger-nose-finger and heel-to-shin bilaterally.  Gait and station: Gait   DIAGNOSTIC DATA (LABS, IMAGING, TESTING) - I reviewed patient records, labs, notes, testing and imaging myself where available.  Lab Results  Component Value Date   WBC 3.6 (L) 06/26/2017   HGB 11.6 (L) 06/26/2017   HCT 38.3 06/26/2017   MCV 97.7 06/26/2017   PLT 153 06/26/2017      Component Value Date/Time   NA 140 06/26/2017 0909   K 3.5 06/26/2017 0909   CL 104 06/26/2017 0909   CO2 29 06/26/2017 0909   GLUCOSE 115 (H) 06/26/2017 0909   BUN 21 (H) 06/26/2017 0909   CREATININE 0.89 06/26/2017 0909   CREATININE 0.82 04/27/2016 1452   CALCIUM 9.9 06/26/2017 0909   PROT 6.5 06/26/2017 0909   ALBUMIN 4.2 06/26/2017 0909   AST 23 06/26/2017 0909   ALT 15 06/26/2017 0909   ALKPHOS 48 06/26/2017 0909   BILITOT 0.8 06/26/2017 0909   GFRNONAA 54 (L) 06/26/2017 0909   GFRAA >60 06/26/2017 0909   Lab Results  Component Value Date   CHOL 158 04/09/2013   HDL 63 04/09/2013   LDLCALC 75 04/09/2013   TRIG 98 04/09/2013   CHOLHDL 2.5 04/09/2013   No results found for: HGBA1C Lab Results  Component Value Date   VITAMINB12 818 06/26/2017   Lab Results  Component Value Date   TSH 1.501 08/13/2013      ASSESSMENT AND PLAN 84 y.o. year old female   has  a past medical history of Coronary artery disease, Gait disorder (03/05/2017), HTN (hypertension) (08/30/2012), Hyperlipidemia (08/30/2012), ICD (implantable cardiac defibrillator) in place, Macular degeneration, Pacemaker (08/29/2012), Polyneuropathy in other diseases classified elsewhere [facility]), Shingles (08/2016), SSS (sick sinus syndrome) with PAT and marked bradycardia with 3 sec pauses  (08/30/2012), Status post placement of cardiac pacemaker. 08/29/12 St. Jude device (08/30/2012), and Venous insufficiency. here with:  1.  Peripheral neuropathy 2.  Memory disturbance  The patient's memory score has remained stable.  She will remain on Aricept 10 mg at bedtime.  Her neuropathy has also remained stable.  She will continue using lidocaine cream as needed.  Advised that if her symptoms worsen or she develops new symptoms she should let us know.  He will follow-up in 6 months or sooner if needed.     Butch Penny, MSN, NP-C 09/10/2017, 10:53 AM [facility] [address], [address] [place], [place] [address] [phone]

## 2017-09-10 NOTE — Progress Notes (Signed)
I have read the note, and I agree with the clinical assessment and plan.  Adelaide Pfefferkorn K Kelvin Sennett   

## 2017-09-12 ENCOUNTER — Ambulatory Visit (HOSPITAL_COMMUNITY)
Admission: RE | Admit: 2017-09-12 | Discharge: 2017-09-12 | Disposition: A | Discharge status: Home / Self Care | Payer: Medicare Other | Source: Ambulatory Visit | Attending: Nephrology | Admitting: Nephrology

## 2017-09-12 DIAGNOSIS — N183 Chronic kidney disease, stage 3 unspecified: Secondary | ICD-10-CM

## 2017-09-12 DIAGNOSIS — N189 Chronic kidney disease, unspecified: Secondary | ICD-10-CM | POA: Diagnosis not present

## 2017-09-13 ENCOUNTER — Encounter (HOSPITAL_COMMUNITY): Payer: Medicare Other

## 2017-10-09 ENCOUNTER — Ambulatory Visit (INDEPENDENT_AMBULATORY_CARE_PROVIDER_SITE_OTHER): Payer: Medicare Other | Admitting: *Deleted

## 2017-10-09 DIAGNOSIS — I495 Sick sinus syndrome: Secondary | ICD-10-CM

## 2017-10-09 NOTE — Progress Notes (Signed)
Remote pacemaker transmission.   

## 2017-10-10 ENCOUNTER — Encounter: Payer: Self-pay | Admitting: Cardiology

## 2017-10-24 ENCOUNTER — Other Ambulatory Visit: Payer: Self-pay | Admitting: Neurology

## 2017-10-27 LAB — CUP PACEART REMOTE DEVICE CHECK
Battery Remaining Longevity: 139 mo
Battery Voltage: 2.98 V
Brady Statistic AP VP Percent: 1 %
Brady Statistic AS VP Percent: 1 %
Brady Statistic AS VS Percent: 71 %
Brady Statistic RA Percent Paced: 28 %
Implantable Lead Location: 753859
Implantable Lead Location: 753860
Implantable Pulse Generator Implant Date: 20140414
Lead Channel Impedance Value: 350 Ohm
Lead Channel Impedance Value: 380 Ohm
Lead Channel Pacing Threshold Amplitude: 0.5 V
Lead Channel Pacing Threshold Pulse Width: 0.4 ms
Lead Channel Pacing Threshold Pulse Width: 0.4 ms
Lead Channel Sensing Intrinsic Amplitude: 10.9 mV
Lead Channel Setting Pacing Amplitude: 0.875
Lead Channel Setting Pacing Amplitude: 1.5 V
Lead Channel Setting Pacing Pulse Width: 0.4 ms
MDC IDC LEAD IMPLANT DT: 20140414
MDC IDC LEAD IMPLANT DT: 20140414
MDC IDC MSMT BATTERY REMAINING PERCENTAGE: 95.5 %
MDC IDC MSMT LEADCHNL RA SENSING INTR AMPL: 2.4 mV
MDC IDC MSMT LEADCHNL RV PACING THRESHOLD AMPLITUDE: 0.625 V
MDC IDC PG SERIAL: 7437302
MDC IDC SESS DTM: 20190312060017
MDC IDC SET LEADCHNL RV SENSING SENSITIVITY: 2 mV
MDC IDC STAT BRADY AP VS PERCENT: 29 %
MDC IDC STAT BRADY RV PERCENT PACED: 1 %

## 2017-11-07 ENCOUNTER — Encounter: Payer: Self-pay | Admitting: Internal Medicine

## 2017-11-07 ENCOUNTER — Ambulatory Visit: Payer: Medicare Other | Admitting: Internal Medicine

## 2017-11-07 VITALS — BP 132/70 | HR 72 | Ht 66.0 in | Wt 134.0 lb

## 2017-11-07 DIAGNOSIS — Z950 Presence of cardiac pacemaker: Secondary | ICD-10-CM

## 2017-11-07 DIAGNOSIS — I495 Sick sinus syndrome: Secondary | ICD-10-CM | POA: Diagnosis not present

## 2017-11-07 MED ORDER — RAMIPRIL 5 MG PO CAPS: 5.0000 mg | ORAL_CAPSULE | Freq: Every day | ORAL | 3 refills | Status: DC

## 2017-11-07 MED ORDER — HYDRALAZINE HCL 25 MG PO TABS: ORAL_TABLET | ORAL | 3 refills | Status: DC

## 2017-11-07 NOTE — Progress Notes (Signed)
HPI Pamela [name] returns today for followup. She is a pleasant 84 yo woman with symptomatic tachybrady syndrome and pauses s/p PPM insertion. Overall she feels well however. Her blood pressure has been running on the high side. She denies chest pain or sob. She has some peripheral edema and she has slowed down some with her advanced age and prior hip surgery. No syncope.   Allergies  Allergen Reactions  . Gabapentin Other (See Comments)    Blurred vision, dizziness      Current Outpatient Medications  Medication Sig Dispense Refill  . acetaminophen (TYLENOL) 500 MG tablet Take 500 mg by mouth every 6 (six) hours as needed for mild pain or moderate pain.     Marland Kitchen aspirin EC 81 MG tablet Take 81 mg by mouth every morning.     . calcium-vitamin D (OSCAL WITH D) 500-200 MG-UNIT tablet Take 1 tablet by mouth daily.    . carvedilol (COREG) 12.5 MG tablet TAKE ONE AND ONE-HALF TABLETS TWICE DAILY 270 tablet 1  . Cholecalciferol (VITAMIN D-3 PO) Take 1 capsule by mouth daily.    Marland Kitchen docusate sodium (COLACE) 100 MG capsule Take 1 capsule (100 mg total) by mouth 2 (two) times daily. 10 capsule 0  . donepezil (ARICEPT) 10 MG tablet TAKE ONE TABLET BY MOUTH AT BEDTIME. 30 tablet 11  . folic acid (FOLVITE) 1 MG tablet Take 1 mg by mouth daily.    . hydrALAZINE (APRESOLINE) 25 MG tablet Take 1 tablet by mouth 3 (three) times daily.  10  . lidocaine (LMX) 4 % cream Apply 1 application topically 2 (two) times daily as needed.    . Multiple Vitamins-Minerals (OCUVITE PO) Take 1 tablet by mouth 2 (two) times daily.    . nitroGLYCERIN (NITROSTAT) 0.4 MG SL tablet Place 1 tablet (0.4 mg total) under the tongue every 5 (five) minutes as needed for chest pain. 100 tablet 3  . Polyethyl Glycol-Propyl Glycol (SYSTANE OP) Apply 1 drop to eye daily as needed (Dry Eyes).     . ramipril (ALTACE) 5 MG capsule TAKE ONE (1) CAPSULE EACH DAY 90 capsule 3   No current facility-administered medications for this  visit.      Past Medical History:  Diagnosis Date  . Coronary artery disease    stent in the LAD artery in 2002  . Gait disorder 03/05/2017  . HTN (hypertension) 08/30/2012  . Hyperlipidemia 08/30/2012  . ICD (implantable cardiac defibrillator) in place   . Macular degeneration   . Pacemaker 08/29/2012   st jude accent DR RF device, model number [id number], serial number [id number], implanted 08/29/2012 for sinus bradycardia, runs of supraventricular tachycardia  last checked 10/23/2012  . Polyneuropathy in other diseases classified elsewhere (HCC)   . Shingles 08/2016  . SSS (sick sinus syndrome) with PAT and marked bradycardia with 3 sec pauses  08/30/2012  . Status post placement of cardiac pacemaker. 08/29/12 St. Jude device 08/30/2012  . Venous insufficiency     ROS:   All systems reviewed and negative except as noted in the HPI.   Past Surgical History:  Procedure Laterality Date  . ABDOMINAL HYSTERECTOMY    . basal cell  06/2017   R side of face.  Marland Kitchen CARDIAC CATHETERIZATION  2006   3 stents placed  . CARDIAC CATHETERIZATION  06/2001   placement of BiodivYsio 2.5.10mm stent dilated to 2.40mm in proximal left anterior descenting stenotic lesion  . CESAREAN SECTION    . COLONOSCOPY W/  POLYPECTOMY    . HIP ARTHROPLASTY Left 01/04/2014   Procedure: ARTHROPLASTY BIPOLAR HIP;  Surgeon: Shelda PalMatthew D Olin, MD;  [facility];  Service: Orthopedics;  Laterality: Left;  . INSERT / REPLACE / REMOVE PACEMAKER  08/29/2012   st jude   . KIDNEY STONE SURGERY    . OPEN REDUCTION INTERNAL FIXATION (ORIF) DISTAL RADIAL FRACTURE Right 04/01/2016   Procedure: OPEN REDUCTION INTERNAL FIXATION (ORIF) RIGHT DISTAL RADIAL FRACTURE AND REPAIR;  Surgeon: Bradly BienenstockFred Ortmann, MD;  Location: [facility] OR;  Service: Orthopedics;  Laterality: Right;  . PERMANENT PACEMAKER INSERTION N/A 08/29/2012   Procedure: PERMANENT PACEMAKER INSERTION;  Surgeon: Thurmon FairMihai Croitoru, MD;  Location: [facility] CATH LAB;  Service: Cardiovascular;   Laterality: N/A;  . RADIOFREQUENCY ABLATION  08/25/2010   ablation of left greater saphenous vein  . ROTATOR CUFF REPAIR Left   . ROTATOR CUFF REPAIR Right   . TOTAL HIP REVISION Left 07/24/2015   Procedure: ORIF LEFT PERIPROSTHETIC FEMUR FRACTURE; CONVERSION TO TOTAL HIP ARTHROPLASTY POSS. FEMORAL COMPONENT REVISION ;  Surgeon: Samson FredericBrian Swinteck, MD;  [facility];  Service: Orthopedics;  Laterality: Left;     Family History  Problem Relation Age of Onset  . Heart disease Mother   . Congestive Heart Failure Mother   . Heart disease Father   . Heart attack Father   . Heart disease Brother      Social History   Socioeconomic History  . Marital status: Widowed    Spouse name: Not on file  . Number of children: 2  . Years of education: Not on file  . Highest education level: Not on file  Occupational History    Employer: RETIRED  Social Needs  . Financial resource strain: Not on file  . Food insecurity:    Worry: Not on file    Inability: Not on file  . Transportation needs:    Medical: Not on file    Non-medical: Not on file  Tobacco Use  . Smoking status: Never Smoker  . Smokeless tobacco: Never Used  Substance and Sexual Activity  . Alcohol use: No    Alcohol/week: 0.0 oz  . Drug use: No  . Sexual activity: Not on file  Lifestyle  . Physical activity:    Days per week: Not on file    Minutes per session: Not on file  . Stress: Not on file  Relationships  . Social connections:    Talks on phone: Not on file    Gets together: Not on file    Attends religious service: Not on file    Active member of club or organization: Not on file    Attends meetings of clubs or organizations: Not on file    Relationship status: Not on file  . Intimate partner violence:    Fear of current or ex partner: Not on file    Emotionally abused: Not on file    Physically abused: Not on file    Forced sexual activity: Not on file  Other Topics Concern  . Not on file  Social  History Narrative   Patient drinks about 2 cups of caffeine daily.   Patient is right handed.     BP 132/70 (BP Location: Right Arm)   Pulse 72   Ht 5\' 6"  (1.676 m)   Wt 134 lb (60.8 kg)   SpO2 97%   BMI 21.63 kg/m   Physical Exam:  stable appearing elderly woman, NAD HEENT: Unremarkable Neck:  6 cm JVD, no thyromegally  Lymphatics:  No adenopathy Back:  No CVA tenderness Lungs:  Clear except for basilar rales HEART:  Regular rate rhythm, no murmurs, no rubs, no clicks Abd:  soft, positive bowel sounds, no organomegally, no rebound, no guarding Ext:  2 plus pulses, 1+ edema, no cyanosis, no clubbing Skin:  No rashes no nodules Neuro:  CN II through XII intact, motor grossly intact  EKG - none  DEVICE  Normal device function.  See PaceArt for details.   Assess/Plan: 1. Sinus node dysfunction - she is asymptomatic, s/p PPM incision 2. HTN - her blood pressure is well controlled.  3. PAF - she is maintaining NSR and she is a poor candidate for systemic anti-coagulation. 4. PPM - her St. Jude DDD PM is working normally. Will recheck in several months.  Leonia Reeves.D.

## 2017-11-07 NOTE — Patient Instructions (Addendum)
Medication Instructions:  Your physician has recommended you make the following change in your medication:  Decrease Ramipril to 5 mg Daily  Increase Hydralazine to 1 Tablet in the Am, 2 Tablets at Noon, and 1 Tablet in the PM    Labwork: NONE   Testing/Procedures: NONE   Follow-Up: Your physician wants you to follow-up in: 1 year. You will receive a reminder letter in the mail two months in advance. If you don't receive a letter, please call our office to schedule the follow-up appointment.   Any Other Special Instructions Will Be Listed Below (If Applicable).     If you need a refill on your cardiac medications before your next appointment, please call your pharmacy.  Thank you for choosing [facility]!

## 2017-11-08 ENCOUNTER — Telehealth: Payer: Self-pay | Admitting: Internal Medicine

## 2017-11-08 NOTE — Telephone Encounter (Signed)
Questions answered about dose of Ramipril. Pt voiced understanding.

## 2017-11-08 NOTE — Telephone Encounter (Signed)
Question about AVS- medication dose

## 2017-11-13 DIAGNOSIS — H353113 Nonexudative age-related macular degeneration, right eye, advanced atrophic without subfoveal involvement: Secondary | ICD-10-CM | POA: Diagnosis not present

## 2017-11-15 LAB — CUP PACEART INCLINIC DEVICE CHECK
Date Time Interrogation Session: 20190418125957
Implantable Lead Implant Date: 20140414
Implantable Lead Location: 753859
Implantable Pulse Generator Implant Date: 20140414
MDC IDC LEAD IMPLANT DT: 20140414
MDC IDC LEAD LOCATION: 753860
Pulse Gen Model: 2210
Pulse Gen Serial Number: 7437302

## 2017-11-19 ENCOUNTER — Other Ambulatory Visit: Payer: Self-pay | Admitting: Internal Medicine

## 2018-01-08 ENCOUNTER — Telehealth: Payer: Self-pay

## 2018-01-08 ENCOUNTER — Encounter: Payer: Medicare Other | Admitting: *Deleted

## 2018-01-08 NOTE — Telephone Encounter (Signed)
LMOVM reminding pt to send remote transmission.   

## 2018-01-09 ENCOUNTER — Encounter: Payer: Self-pay | Admitting: Cardiology

## 2018-01-14 ENCOUNTER — Ambulatory Visit (INDEPENDENT_AMBULATORY_CARE_PROVIDER_SITE_OTHER): Payer: Medicare Other | Admitting: *Deleted

## 2018-01-14 DIAGNOSIS — I495 Sick sinus syndrome: Secondary | ICD-10-CM

## 2018-01-14 NOTE — Progress Notes (Signed)
Remote pacemaker transmission.   

## 2018-01-15 ENCOUNTER — Encounter: Payer: Self-pay | Admitting: Cardiology

## 2018-01-16 ENCOUNTER — Telehealth: Payer: Self-pay | Admitting: Internal Medicine

## 2018-01-16 DIAGNOSIS — J069 Acute upper respiratory infection, unspecified: Secondary | ICD-10-CM | POA: Diagnosis not present

## 2018-01-16 DIAGNOSIS — J209 Acute bronchitis, unspecified: Secondary | ICD-10-CM | POA: Diagnosis not present

## 2018-01-16 NOTE — Telephone Encounter (Signed)
Pt was not home for her remote check last week, wanted to make someone aware

## 2018-01-16 NOTE — Telephone Encounter (Signed)
LM informing daughter in law that remote was received on 01/13/18. Encouraged daughter in law to call with any further questions.

## 2018-01-17 ENCOUNTER — Other Ambulatory Visit (HOSPITAL_COMMUNITY): Payer: Medicare Other

## 2018-01-17 ENCOUNTER — Inpatient Hospital Stay (HOSPITAL_COMMUNITY): Payer: Medicare Other | Attending: Hematology

## 2018-01-17 ENCOUNTER — Ambulatory Visit (HOSPITAL_COMMUNITY): Payer: Medicare Other | Admitting: Hematology

## 2018-01-17 DIAGNOSIS — I2510 Atherosclerotic heart disease of native coronary artery without angina pectoris: Secondary | ICD-10-CM | POA: Diagnosis not present

## 2018-01-17 DIAGNOSIS — D61818 Other pancytopenia: Secondary | ICD-10-CM

## 2018-01-17 DIAGNOSIS — Z7982 Long term (current) use of aspirin: Secondary | ICD-10-CM | POA: Insufficient documentation

## 2018-01-17 DIAGNOSIS — G629 Polyneuropathy, unspecified: Secondary | ICD-10-CM | POA: Diagnosis not present

## 2018-01-17 DIAGNOSIS — I10 Essential (primary) hypertension: Secondary | ICD-10-CM | POA: Diagnosis not present

## 2018-01-17 DIAGNOSIS — Z950 Presence of cardiac pacemaker: Secondary | ICD-10-CM | POA: Diagnosis not present

## 2018-01-17 LAB — COMPREHENSIVE METABOLIC PANEL
ALK PHOS: 49 U/L (ref 38–126)
ALT: 16 U/L (ref 14–54)
AST: 23 U/L (ref 15–41)
Albumin: 4 g/dL (ref 3.5–5.0)
Anion gap: 8 (ref 5–15)
BILIRUBIN TOTAL: 0.7 mg/dL (ref 0.3–1.2)
BUN: 17 mg/dL (ref 6–20)
CALCIUM: 9.3 mg/dL (ref 8.9–10.3)
CO2: 26 mmol/L (ref 22–32)
Chloride: 105 mmol/L (ref 101–111)
Creatinine, Ser: 0.95 mg/dL (ref 0.44–1.00)
GFR calc Af Amer: 57 mL/min — ABNORMAL LOW (ref >60)
GFR calc non Af Amer: 49 mL/min — ABNORMAL LOW (ref >60)
GLUCOSE: 107 mg/dL — AB (ref 65–99)
POTASSIUM: 3.6 mmol/L (ref 3.5–5.1)
Sodium: 139 mmol/L (ref 135–145)
TOTAL PROTEIN: 6.3 g/dL — AB (ref 6.5–8.1)

## 2018-01-17 LAB — CBC WITH DIFFERENTIAL/PLATELET
BASOS ABS: 0 10*3/uL (ref 0.0–0.1)
BASOS PCT: 0 %
Eosinophils Absolute: 0 10*3/uL (ref 0.0–0.7)
Eosinophils Relative: 0 %
HEMATOCRIT: 35.7 % — AB (ref 36.0–46.0)
HEMOGLOBIN: 11.4 g/dL — AB (ref 12.0–15.0)
Lymphocytes Relative: 31 %
Lymphs Abs: 0.9 10*3/uL (ref 0.7–4.0)
MCH: 30.5 pg (ref 26.0–34.0)
MCHC: 31.9 g/dL (ref 30.0–36.0)
MCV: 95.5 fL (ref 78.0–100.0)
Monocytes Absolute: 0.4 10*3/uL (ref 0.1–1.0)
Monocytes Relative: 13 %
NEUTROS PCT: 56 %
Neutro Abs: 1.7 10*3/uL (ref 1.7–7.7)
Platelets: 112 10*3/uL — ABNORMAL LOW (ref 150–400)
RBC: 3.74 MIL/uL — AB (ref 3.87–5.11)
RDW: 14.9 % (ref 11.5–15.5)
WBC: 3 10*3/uL — ABNORMAL LOW (ref 4.0–10.5)

## 2018-01-24 ENCOUNTER — Encounter (HOSPITAL_COMMUNITY): Payer: Self-pay | Admitting: Hematology

## 2018-01-24 ENCOUNTER — Other Ambulatory Visit: Payer: Self-pay

## 2018-01-24 ENCOUNTER — Inpatient Hospital Stay (HOSPITAL_COMMUNITY): Payer: Medicare Other | Admitting: Hematology

## 2018-01-24 VITALS — BP 141/60 | HR 82 | Temp 98.5°F | Resp 18 | Wt 122.1 lb

## 2018-01-24 DIAGNOSIS — I10 Essential (primary) hypertension: Secondary | ICD-10-CM

## 2018-01-24 DIAGNOSIS — Z7982 Long term (current) use of aspirin: Secondary | ICD-10-CM

## 2018-01-24 DIAGNOSIS — G629 Polyneuropathy, unspecified: Secondary | ICD-10-CM | POA: Diagnosis not present

## 2018-01-24 DIAGNOSIS — I2510 Atherosclerotic heart disease of native coronary artery without angina pectoris: Secondary | ICD-10-CM | POA: Diagnosis not present

## 2018-01-24 DIAGNOSIS — Z950 Presence of cardiac pacemaker: Secondary | ICD-10-CM

## 2018-01-24 DIAGNOSIS — D61818 Other pancytopenia: Secondary | ICD-10-CM | POA: Diagnosis not present

## 2018-01-24 NOTE — Progress Notes (Signed)
Pamela [name] [address], [place] [address]   CLINIC:  Medical Oncology/Hematology  PCP:  Pamela Nevins, Pamela [name] [address] [place] [address] [phone]   REASON FOR VISIT:  Follow-up for pancytopenia.  CURRENT THERAPY: Observation.   INTERVAL HISTORY:  Pamela [name] 84 y.o. female returns for follow-up of pancytopenia.  She is accompanied by her daughter-in-law today.  Recently reports having a cold which led to coughing.  She was given an antibiotic and Tessalon Perles which she is taking.  Symptoms are improving.  On and off numbness in the feet has been stable.  She drinks boost every other day.  Energy levels are reported as 50%.  Denies any recent hospitalizations or recurrent infections.    REVIEW OF SYSTEMS:  Review of Systems  Constitutional: Positive for fatigue.  Respiratory: Positive for cough.   Neurological: Positive for numbness.  All other systems reviewed and are negative.    PAST MEDICAL/SURGICAL HISTORY:  Past Medical History:  Diagnosis Date  . Coronary artery disease    stent in the LAD artery in 2002  . Gait disorder 03/05/2017  . HTN (hypertension) 08/30/2012  . Hyperlipidemia 08/30/2012  . ICD (implantable cardiac defibrillator) in place   . Macular degeneration   . Pacemaker 08/29/2012   st jude accent DR RF device, model number [id number], serial number [id number], implanted 08/29/2012 for sinus bradycardia, runs of supraventricular tachycardia  last checked 10/23/2012  . Polyneuropathy in other diseases classified elsewhere (HCC)   . Shingles 08/2016  . SSS (sick sinus syndrome) with PAT and marked bradycardia with 3 sec pauses  08/30/2012  . Status post placement of cardiac pacemaker. 08/29/12 St. Jude device 08/30/2012  . Venous insufficiency    Past Surgical History:  Procedure Laterality Date  . ABDOMINAL HYSTERECTOMY    . basal cell  06/2017   R side of face.  Marland Kitchen CARDIAC CATHETERIZATION  2006   3 stents placed   . CARDIAC CATHETERIZATION  06/2001   placement of BiodivYsio 2.5.10mm stent dilated to 2.42mm in proximal left anterior descenting stenotic lesion  . CESAREAN SECTION    . COLONOSCOPY W/ POLYPECTOMY    . HIP ARTHROPLASTY Left 01/04/2014   Procedure: ARTHROPLASTY BIPOLAR HIP;  Surgeon: Shelda Pal, Pamela [name];  Location: [facility] OR;  Service: Orthopedics;  Laterality: Left;  . INSERT / REPLACE / REMOVE PACEMAKER  08/29/2012   st jude   . KIDNEY STONE SURGERY    . OPEN REDUCTION INTERNAL FIXATION (ORIF) DISTAL RADIAL FRACTURE Right 04/01/2016   Procedure: OPEN REDUCTION INTERNAL FIXATION (ORIF) RIGHT DISTAL RADIAL FRACTURE AND REPAIR;  Surgeon: Bradly Bienenstock, Pamela [name];  Location: [facility] OR;  Service: Orthopedics;  Laterality: Right;  . PERMANENT PACEMAKER INSERTION N/A 08/29/2012   Procedure: PERMANENT PACEMAKER INSERTION;  Surgeon: Thurmon Fair, Pamela [name];  Location: [facility] CATH LAB;  Service: Cardiovascular;  Laterality: N/A;  . RADIOFREQUENCY ABLATION  08/25/2010   ablation of left greater saphenous vein  . ROTATOR CUFF REPAIR Left   . ROTATOR CUFF REPAIR Right   . TOTAL HIP REVISION Left 07/24/2015   Procedure: ORIF LEFT PERIPROSTHETIC FEMUR FRACTURE; CONVERSION TO TOTAL HIP ARTHROPLASTY POSS. FEMORAL COMPONENT REVISION ;  Surgeon: Samson Frederic, Pamela [name];  Location: [facility] OR;  Service: Orthopedics;  Laterality: Left;     SOCIAL HISTORY:  Social History   Socioeconomic History  . Marital status: Widowed    Spouse name: Not on file  . Number of children: 2  . Years of education: Not on file  .  Highest education level: Not on file  Occupational History    Employer: RETIRED  Social Needs  . Financial resource strain: Not on file  . Food insecurity:    Worry: Not on file    Inability: Not on file  . Transportation needs:    Medical: Not on file    Non-medical: Not on file  Tobacco Use  . Smoking status: Never Smoker  . Smokeless tobacco: Never Used  Substance and Sexual Activity  . Alcohol use: No     Alcohol/week: 0.0 oz  . Drug use: No  . Sexual activity: Not on file  Lifestyle  . Physical activity:    Days per week: Not on file    Minutes per session: Not on file  . Stress: Not on file  Relationships  . Social connections:    Talks on phone: Not on file    Gets together: Not on file    Attends religious service: Not on file    Active member of club or organization: Not on file    Attends meetings of clubs or organizations: Not on file    Relationship status: Not on file  . Intimate partner violence:    Fear of current or ex partner: Not on file    Emotionally abused: Not on file    Physically abused: Not on file    Forced sexual activity: Not on file  Other Topics Concern  . Not on file  Social History Narrative   Patient drinks about 2 cups of caffeine daily.   Patient is right handed.    FAMILY HISTORY:  Family History  Problem Relation Age of Onset  . Heart disease Mother   . Congestive Heart Failure Mother   . Heart disease Father   . Heart attack Father   . Heart disease Brother     CURRENT MEDICATIONS:  Outpatient Encounter Medications as of 01/24/2018  Medication Sig Note  . acetaminophen (TYLENOL) 500 MG tablet Take 500 mg by mouth every 6 (six) hours as needed for mild pain or moderate pain.    Marland Kitchen. aspirin EC 81 MG tablet Take 81 mg by mouth every morning.    . benzonatate (TESSALON) 200 MG capsule Take 200 mg by mouth 3 (three) times daily as needed for cough.   . calcium-vitamin D (OSCAL WITH D) 500-200 MG-UNIT tablet Take 1 tablet by mouth daily.   . carvedilol (COREG) 12.5 MG tablet Take 1.5 tablets (18.75 mg total) by mouth 2 (two) times daily with a meal.   . Cholecalciferol (VITAMIN D-3 PO) Take 1 capsule by mouth daily.   Marland Kitchen. docusate sodium (COLACE) 100 MG capsule Take 1 capsule (100 mg total) by mouth 2 (two) times daily. 04/21/2017: Patient has if needed but has not had to take in the past month  . donepezil (ARICEPT) 10 MG tablet TAKE ONE TABLET BY  MOUTH AT BEDTIME.   . folic acid (FOLVITE) 1 MG tablet Take 1 mg by mouth daily.   . hydrALAZINE (APRESOLINE) 25 MG tablet Take 1 Tablet (25 mg) in the AM, Take 2 Tablets (50 mg) at Noon, and Take 1 Tablets (25 mg) in the PM   . levofloxacin (LEVAQUIN) 500 MG tablet    . lidocaine (LMX) 4 % cream Apply 1 application topically 2 (two) times daily as needed.   . Multiple Vitamins-Minerals (OCUVITE PO) Take 1 tablet by mouth 2 (two) times daily.   . nitroGLYCERIN (NITROSTAT) 0.4 MG SL tablet Place 1 tablet (0.4  mg total) under the tongue every 5 (five) minutes as needed for chest pain.   Bertram Gala Glycol-Propyl Glycol (SYSTANE OP) Apply 1 drop to eye daily as needed (Dry Eyes).    . ramipril (ALTACE) 5 MG capsule Take 1 capsule (5 mg total) by mouth daily.    No facility-administered encounter medications on file as of 01/24/2018.     ALLERGIES:  Allergies  Allergen Reactions  . Gabapentin Other (See Comments)    Blurred vision, dizziness      PHYSICAL EXAM:  ECOG Performance status: 2  Vitals:   01/24/18 1408  BP: (!) 141/60  Pulse: 82  Resp: 18  Temp: 98.5 F (36.9 C)  SpO2: 98%   Filed Weights   01/24/18 1408  Weight: 122 lb 1.6 oz (55.4 kg)    Physical Exam   LABORATORY DATA:  I have reviewed the labs as listed.  CBC    Component Value Date/Time   WBC 3.0 (L) 01/17/2018 1206   RBC 3.74 (L) 01/17/2018 1206   HGB 11.4 (L) 01/17/2018 1206   HCT 35.7 (L) 01/17/2018 1206   PLT 112 (L) 01/17/2018 1206   MCV 95.5 01/17/2018 1206   MCH 30.5 01/17/2018 1206   MCHC 31.9 01/17/2018 1206   RDW 14.9 01/17/2018 1206   LYMPHSABS 0.9 01/17/2018 1206   MONOABS 0.4 01/17/2018 1206   EOSABS 0.0 01/17/2018 1206   BASOSABS 0.0 01/17/2018 1206   CMP Latest Ref Rng & Units 01/17/2018 06/26/2017 04/21/2017  Glucose 65 - 99 mg/dL 161(W) 960(A) 540(J)  BUN 6 - 20 mg/dL 17 81(X) 15  Creatinine 0.44 - 1.00 mg/dL 9.14 7.82 9.56  Sodium 135 - 145 mmol/L 139 140 142  Potassium 3.5  - 5.1 mmol/L 3.6 3.5 3.3(L)  Chloride 101 - 111 mmol/L 105 104 103  CO2 22 - 32 mmol/L 26 29 -  Calcium 8.9 - 10.3 mg/dL 9.3 9.9 -  Total Protein 6.5 - 8.1 g/dL 6.3(L) 6.5 -  Total Bilirubin 0.3 - 1.2 mg/dL 0.7 0.8 -  Alkaline Phos 38 - 126 U/L 49 48 -  AST 15 - 41 U/L 23 23 -  ALT 14 - 54 U/L 16 15 -          ASSESSMENT & PLAN:   Other pancytopenia (HCC) 1.  Pancytopenia: - History of leukopenia since January 2017.  No increased risk of infections. -History of mild to moderate thrombocytopenia dating back to 2016 without any bleeding complications. - Recently had a cold and has some cough.  She was given antibiotic and Tessalon Perles.  Otherwise denies any recurrent infections or hospitalizations. -I have reviewed the most recent CBC with the patient and her daughter-in-law.  White count improved to 3000 and absolute neutrophil count is 1700 with a platelet count of 112. -As her counts are stable, I will follow her in 1 year with repeat blood count.  I will also check B12 and folic acid levels at that time.      Orders placed this encounter:  Orders Placed This Encounter  Procedures  . Vitamin B12  . Folate  . CBC with Differential/Platelet  . Comprehensive metabolic panel      Doreatha Massed, Pamela [name] [facility] [phone]

## 2018-01-24 NOTE — Assessment & Plan Note (Signed)
1.  Pancytopenia: - History of leukopenia since January 2017.  No increased risk of infections. -History of mild to moderate thrombocytopenia dating back to 2016 without any bleeding complications. - Recently had a cold and has some cough.  She was given antibiotic and Tessalon Perles.  Otherwise denies any recurrent infections or hospitalizations. -I have reviewed the most recent CBC with the patient and her daughter-in-law.  White count improved to 3000 and absolute neutrophil count is 1700 with a platelet count of 112. -As her counts are stable, I will follow her in 1 year with repeat blood count.  I will also check B12 and folic acid levels at that time.

## 2018-01-26 LAB — CUP PACEART REMOTE DEVICE CHECK
Battery Remaining Longevity: 139 mo
Battery Remaining Percentage: 95.5 %
Brady Statistic AP VS Percent: 22 %
Brady Statistic AS VP Percent: 1 %
Date Time Interrogation Session: 20190617002641
Implantable Lead Implant Date: 20140414
Implantable Lead Location: 753859
Implantable Lead Location: 753860
Lead Channel Impedance Value: 310 Ohm
Lead Channel Pacing Threshold Amplitude: 0.625 V
Lead Channel Pacing Threshold Pulse Width: 0.4 ms
Lead Channel Sensing Intrinsic Amplitude: 12 mV
Lead Channel Sensing Intrinsic Amplitude: 2.6 mV
Lead Channel Setting Pacing Amplitude: 1.375
Lead Channel Setting Pacing Pulse Width: 0.4 ms
Lead Channel Setting Sensing Sensitivity: 2 mV
MDC IDC LEAD IMPLANT DT: 20140414
MDC IDC MSMT BATTERY VOLTAGE: 2.98 V
MDC IDC MSMT LEADCHNL RA PACING THRESHOLD AMPLITUDE: 0.375 V
MDC IDC MSMT LEADCHNL RV IMPEDANCE VALUE: 430 Ohm
MDC IDC MSMT LEADCHNL RV PACING THRESHOLD PULSEWIDTH: 0.4 ms
MDC IDC PG IMPLANT DT: 20140414
MDC IDC SET LEADCHNL RV PACING AMPLITUDE: 0.875
MDC IDC STAT BRADY AP VP PERCENT: 1 %
MDC IDC STAT BRADY AS VS PERCENT: 78 %
MDC IDC STAT BRADY RA PERCENT PACED: 21 %
MDC IDC STAT BRADY RV PERCENT PACED: 1 %
Pulse Gen Model: 2210
Pulse Gen Serial Number: 7437302

## 2018-02-20 ENCOUNTER — Telehealth: Payer: Self-pay | Admitting: Internal Medicine

## 2018-02-20 NOTE — Telephone Encounter (Signed)
PLEASE CALL ANDY AT [facility] CONCERNING THE PT'S carvedilol (COREG) 12.5 MG tablet [[id number]][[id number]]

## 2018-02-20 NOTE — Telephone Encounter (Signed)
Pamela [name] called to clarify how often pt is taking Coreg daily. Charts states she is taking two times daily.

## 2018-02-26 ENCOUNTER — Telehealth: Payer: Self-pay | Admitting: *Deleted

## 2018-02-26 MED ORDER — CARVEDILOL 12.5 MG PO TABS: 12.5000 mg | ORAL_TABLET | Freq: Two times a day (BID) | ORAL | 3 refills | Status: DC

## 2018-02-26 NOTE — Telephone Encounter (Signed)
Spoke with Kriste BasqueBecky pt's daughter in Social workerlaw. She states that the pt is currently taking coreg 12.5mg  BID. Kriste BasqueBecky states that in April when taking 18.75 of Coreg she was very tired. Current BP are  7/22 140/69 7/23 150/78 7/24 124/63 7/26 144/73 7/27 131/63

## 2018-02-26 NOTE — Telephone Encounter (Signed)
Left message for Pt's daughter.  Advised per Dr. Junie Spenceraylor-ok to decrease carvedilol 12.5 mg to one tablet by mouth twice a day.  Advised to continue to follow BP's and if systolic greater than 150 consistently call office for further direction.

## 2018-02-27 ENCOUNTER — Telehealth: Payer: Self-pay | Admitting: Internal Medicine

## 2018-02-27 MED ORDER — CARVEDILOL 12.5 MG PO TABS: 12.5000 mg | ORAL_TABLET | Freq: Two times a day (BID) | ORAL | 3 refills | Status: DC

## 2018-02-27 NOTE — Telephone Encounter (Signed)
Attempt to reach, got vm, [id number] 

## 2018-02-27 NOTE — Telephone Encounter (Signed)
Please call Richelle ItoBecky Norman @ [phone]-- concerning pt's medication

## 2018-02-27 NOTE — Telephone Encounter (Signed)
Family request we notify [facility] that coreg was decreased to 12.5 mg bid

## 2018-03-12 ENCOUNTER — Ambulatory Visit: Payer: Medicare Other | Admitting: Adult Health

## 2018-03-12 ENCOUNTER — Encounter: Payer: Self-pay | Admitting: Adult Health

## 2018-03-12 VITALS — BP 132/62 | HR 62 | Ht 66.0 in | Wt 132.0 lb

## 2018-03-12 DIAGNOSIS — G609 Hereditary and idiopathic neuropathy, unspecified: Secondary | ICD-10-CM

## 2018-03-12 DIAGNOSIS — R413 Other amnesia: Secondary | ICD-10-CM

## 2018-03-12 MED ORDER — MEMANTINE HCL 28 X 5 MG & 21 X 10 MG PO TABS: ORAL_TABLET | ORAL | 0 refills | Status: DC

## 2018-03-12 NOTE — Progress Notes (Signed)
PATIENT: Pamela [name] DOB: 1920/10/24  REASON FOR VISIT: follow up HISTORY FROM: patient  HISTORY OF PRESENT ILLNESS: Today 03/12/18:  Pamela [name] is a 84 year old female with a history of peripheral neuropathy and memory disturbance.  She returns today for follow-up.  She reports that her symptoms have remained stable.  She continues daily use lidocaine gel for her neuropathy with good benefit.  She denies any falls.  She remains on Aricept for her memory.  She lives at home alone but she does have someone that comes in to help her with her housework and bathing.  She does not operate a motor vehicle.  Reports good appetite.  No change in mood or behavior.  Denies hallucinations.  She returns today for evaluation.  HISTORY 09/10/17 Pamela [name] is a 84 year old female with a history of peripheral neuropathy and memory disturbance.  She returns today for follow-up.  She reports that the lidocaine gel continues to work well for her neuropathy.  She denies any changes with her gait or balance.  Denies any falls.  She reports that the discomfort does not interrupt her sleep.  She feels that her memory has remained stable.  She lives at home alone.  She is able to complete all ADLs independently.  She does not operate a motor vehicle.  Reports good appetite.  Denies any significant changes in mood or behavior.  She continues on Aricept 10 mg at bedtime.  She returns today for an evaluation.    REVIEW OF SYSTEMS: Out of a complete 14 system review of symptoms, the patient complains only of the following symptoms, and all other reviewed systems are negative.  Memory loss  ALLERGIES: Allergies  Allergen Reactions  . Gabapentin Other (See Comments)    Blurred vision, dizziness     HOME MEDICATIONS: Outpatient Medications Prior to Visit  Medication Sig Dispense Refill  . acetaminophen (TYLENOL) 500 MG tablet Take 500 mg by mouth every 6 (six) hours as needed for mild pain or  moderate pain.     Marland Kitchen aspirin EC 81 MG tablet Take 81 mg by mouth every morning.     . benzonatate (TESSALON) 200 MG capsule Take 200 mg by mouth 3 (three) times daily as needed for cough.    . calcium-vitamin D (OSCAL WITH D) 500-200 MG-UNIT tablet Take 1 tablet by mouth daily.    . carvedilol (COREG) 12.5 MG tablet Take 1 tablet (12.5 mg total) by mouth 2 (two) times daily. 180 tablet 3  . Cholecalciferol (VITAMIN D-3 PO) Take 1 capsule by mouth daily.    Marland Kitchen docusate sodium (COLACE) 100 MG capsule Take 1 capsule (100 mg total) by mouth 2 (two) times daily. 10 capsule 0  . donepezil (ARICEPT) 10 MG tablet TAKE ONE TABLET BY MOUTH AT BEDTIME. 30 tablet 11  . folic acid (FOLVITE) 1 MG tablet Take 1 mg by mouth daily.    . hydrALAZINE (APRESOLINE) 25 MG tablet Take 1 Tablet (25 mg) in the AM, Take 2 Tablets (50 mg) at Noon, and Take 1 Tablets (25 mg) in the PM 360 tablet 3  . levofloxacin (LEVAQUIN) 500 MG tablet   0  . lidocaine (LMX) 4 % cream Apply 1 application topically 2 (two) times daily as needed.    . Multiple Vitamins-Minerals (OCUVITE PO) Take 1 tablet by mouth 2 (two) times daily.    . nitroGLYCERIN (NITROSTAT) 0.4 MG SL tablet Place 1 tablet (0.4 mg total) under the tongue every 5 (five) minutes  as needed for chest pain. 100 tablet 3  . Polyethyl Glycol-Propyl Glycol (SYSTANE OP) Apply 1 drop to eye daily as needed (Dry Eyes).     . ramipril (ALTACE) 5 MG capsule Take 1 capsule (5 mg total) by mouth daily. 90 capsule 3   No facility-administered medications prior to visit.     PAST MEDICAL HISTORY: Past Medical History:  Diagnosis Date  . Coronary artery disease    stent in the LAD artery in 2002  . Gait disorder 03/05/2017  . HTN (hypertension) 08/30/2012  . Hyperlipidemia 08/30/2012  . ICD (implantable cardiac defibrillator) in place   . Macular degeneration   . Pacemaker 08/29/2012   st jude accent DR RF device, model number [id number], serial number [id number], implanted 08/29/2012  for sinus bradycardia, runs of supraventricular tachycardia  last checked 10/23/2012  . Polyneuropathy in other diseases classified elsewhere (HCC)   . Shingles 08/2016  . SSS (sick sinus syndrome) with PAT and marked bradycardia with 3 sec pauses  08/30/2012  . Status post placement of cardiac pacemaker. 08/29/12 St. Jude device 08/30/2012  . Venous insufficiency     PAST SURGICAL HISTORY: Past Surgical History:  Procedure Laterality Date  . ABDOMINAL HYSTERECTOMY    . basal cell  06/2017   R side of face.  Marland Kitchen. CARDIAC CATHETERIZATION  2006   3 stents placed  . CARDIAC CATHETERIZATION  06/2001   placement of BiodivYsio 2.5.10mm stent dilated to 2.1675mm in proximal left anterior descenting stenotic lesion  . CESAREAN SECTION    . COLONOSCOPY W/ POLYPECTOMY    . HIP ARTHROPLASTY Left 01/04/2014   Procedure: ARTHROPLASTY BIPOLAR HIP;  Surgeon: Shelda PalMatthew D Olin, MD;  [facility];  Service: Orthopedics;  Laterality: Left;  . INSERT / REPLACE / REMOVE PACEMAKER  08/29/2012   st jude   . KIDNEY STONE SURGERY    . OPEN REDUCTION INTERNAL FIXATION (ORIF) DISTAL RADIAL FRACTURE Right 04/01/2016   Procedure: OPEN REDUCTION INTERNAL FIXATION (ORIF) RIGHT DISTAL RADIAL FRACTURE AND REPAIR;  Surgeon: Bradly BienenstockFred Ortmann, MD;  Location: [facility] OR;  Service: Orthopedics;  Laterality: Right;  . PERMANENT PACEMAKER INSERTION N/A 08/29/2012   Procedure: PERMANENT PACEMAKER INSERTION;  Surgeon: Thurmon FairMihai Croitoru, MD;  Location: [facility] CATH LAB;  Service: Cardiovascular;  Laterality: N/A;  . RADIOFREQUENCY ABLATION  08/25/2010   ablation of left greater saphenous vein  . ROTATOR CUFF REPAIR Left   . ROTATOR CUFF REPAIR Right   . TOTAL HIP REVISION Left 07/24/2015   Procedure: ORIF LEFT PERIPROSTHETIC FEMUR FRACTURE; CONVERSION TO TOTAL HIP ARTHROPLASTY POSS. FEMORAL COMPONENT REVISION ;  Surgeon: Samson FredericBrian Swinteck, MD;  [facility];  Service: Orthopedics;  Laterality: Left;    FAMILY HISTORY: Family History  Problem  Relation Age of Onset  . Heart disease Mother   . Congestive Heart Failure Mother   . Heart disease Father   . Heart attack Father   . Heart disease Brother     SOCIAL HISTORY: Social History   Socioeconomic History  . Marital status: Widowed    Spouse name: Not on file  . Number of children: 2  . Years of education: Not on file  . Highest education level: Not on file  Occupational History    Employer: RETIRED  Social Needs  . Financial resource strain: Not on file  . Food insecurity:    Worry: Not on file    Inability: Not on file  . Transportation needs:    Medical: Not on file  Non-medical: Not on file  Tobacco Use  . Smoking status: Never Smoker  . Smokeless tobacco: Never Used  Substance and Sexual Activity  . Alcohol use: No    Alcohol/week: 0.0 standard drinks  . Drug use: No  . Sexual activity: Not on file  Lifestyle  . Physical activity:    Days per week: Not on file    Minutes per session: Not on file  . Stress: Not on file  Relationships  . Social connections:    Talks on phone: Not on file    Gets together: Not on file    Attends religious service: Not on file    Active member of club or organization: Not on file    Attends meetings of clubs or organizations: Not on file    Relationship status: Not on file  . Intimate partner violence:    Fear of current or ex partner: Not on file    Emotionally abused: Not on file    Physically abused: Not on file    Forced sexual activity: Not on file  Other Topics Concern  . Not on file  Social History Narrative   Patient drinks about 2 cups of caffeine daily.   Patient is right handed.      PHYSICAL EXAM  Vitals:   03/12/18 1324  BP: 132/62  Pulse: 62  Weight: 132 lb (59.9 kg)  Height: 5\' 6"  (1.676 m)   Body mass index is 21.31 kg/m.   MMSE - Mini Mental State Exam 03/12/2018 09/10/2017 03/05/2017  Orientation to time 4 3 3   Orientation to Place 4 5 5   Registration 2 3 3   Attention/  Calculation 2 3 1   Recall 2 2 0  Language- name 2 objects 2 2 2   Language- repeat 1 0 1  Language- follow 3 step command 2 3 2   Language- read & follow direction 1 1 1   Write a sentence 1 1 0  Copy design 0 0 0  Total score 21 23 18      Generalized: Well developed, in no acute distress   Neurological examination  Mentation: Alert oriented to time, place, history taking. Follows all commands speech and language fluent Cranial nerve II-XII: Pupils were equal round reactive to light. Extraocular movements were full, visual field were full on confrontational test. Facial sensation and strength were normal. Uvula tongue midline. Head turning and shoulder shrug  were normal and symmetric. Motor: The motor testing reveals 5 over 5 strength of all 4 extremities. Good symmetric motor tone is noted throughout.  Sensory: Sensory testing is intact to soft touch on all 4 extremities. No evidence of extinction is noted.  Coordination: Cerebellar testing reveals good finger-nose-finger and heel-to-shin bilaterally.  Gait and station: Gait is normal. Tandem gait is normal. Romberg is negative. No drift is seen.  Reflexes: Deep tendon reflexes are symmetric and normal bilaterally.   DIAGNOSTIC DATA (LABS, IMAGING, TESTING) - I reviewed patient records, labs, notes, testing and imaging myself where available.  Lab Results  Component Value Date   WBC 3.0 (L) 01/17/2018   HGB 11.4 (L) 01/17/2018   HCT 35.7 (L) 01/17/2018   MCV 95.5 01/17/2018   PLT 112 (L) 01/17/2018      Component Value Date/Time   NA 139 01/17/2018 1206   K 3.6 01/17/2018 1206   CL 105 01/17/2018 1206   CO2 26 01/17/2018 1206   GLUCOSE 107 (H) 01/17/2018 1206   BUN 17 01/17/2018 1206   CREATININE 0.95 01/17/2018 1206  CREATININE 0.82 04/27/2016 1452   CALCIUM 9.3 01/17/2018 1206   PROT 6.3 (L) 01/17/2018 1206   ALBUMIN 4.0 01/17/2018 1206   AST 23 01/17/2018 1206   ALT 16 01/17/2018 1206   ALKPHOS 49 01/17/2018 1206     BILITOT 0.7 01/17/2018 1206   GFRNONAA 49 (L) 01/17/2018 1206   GFRAA 57 (L) 01/17/2018 1206    Lab Results  Component Value Date   VITAMINB12 818 06/26/2017       ASSESSMENT AND PLAN 84 y.o. year old female  has a past medical history of Coronary artery disease, Gait disorder (03/05/2017), HTN (hypertension) (08/30/2012), Hyperlipidemia (08/30/2012), ICD (implantable cardiac defibrillator) in place, Macular degeneration, Pacemaker (08/29/2012), Polyneuropathy in other diseases classified elsewhere [facility](HCC), Shingles (08/2016), SSS (sick sinus syndrome) with PAT and marked bradycardia with 3 sec pauses  (08/30/2012), Status post placement of cardiac pacemaker. 08/29/12 St. Jude device (08/30/2012), and Venous insufficiency. here with:  1.  Peripheral neuropathy 2.  Memory disturbance  Overall the patient has remained stable.  She will continue using the lidocaine gel for neuropathy.  Her memory score has decreased slightly.  She will continue on Aricept 10 mg daily.  We discussed Namenda and she would like to give this a try.  She will be started on Namenda titration pack.  I reviewed potential side effects with the patient.  I have advised that if her symptoms worsen or she develops new symptoms she should let us know.  He will follow-up in 6 months or sooner if needed.     Butch PennyMegan Eleesha Purkey, MSN, NP-C 03/12/2018, 1:29 PM [facility] [address], [address] [place], [place] [address] [phone]

## 2018-03-12 NOTE — Patient Instructions (Addendum)
Your Plan:  Continue Lidocaine gel for neuropathy Memory score is stable Continue Aricept Start Namenda titration pack. At the beginning of week 4 call for new prescription If your symptoms worsen or you develop new symptoms please let us know.   Thank you for coming to see us at [facility]. I hope we have been able to provide you high quality care today.  You may receive a patient satisfaction survey over the next few weeks. We would appreciate your feedback and comments so that we may continue to improve ourselves and the health of our patients.   Memantine Tablets What is this medicine? MEMANTINE (MEM an teen) is used to treat dementia caused by Alzheimer's disease. This medicine may be used for other purposes; ask your health care provider or pharmacist if you have questions. COMMON BRAND NAME(S): Namenda What should I tell my health care provider before I take this medicine? They need to know if you have any of these conditions: -difficulty passing urine -kidney disease -liver disease -seizures -an unusual or allergic reaction to memantine, other medicines, foods, dyes, or preservatives -pregnant or trying to get pregnant -breast-feeding How should I use this medicine? Take this medicine by mouth with a glass of water. Follow the directions on the prescription label. You may take this medicine with or without food. Take your doses at regular intervals. Do not take your medicine more often than directed. Continue to take your medicine even if you feel better. Do not stop taking except on the advice of your doctor or health care professional. Talk to your pediatrician regarding the use of this medicine in children. Special care may be needed. Overdosage: If you think you have taken too much of this medicine contact a poison control center or emergency room at once. NOTE: This medicine is only for you. Do not share this medicine with others. What if I miss a  dose? If you miss a dose, take it as soon as you can. If it is almost time for your next dose, take only that dose. Do not take double or extra doses. If you do not take your medicine for several days, contact your health care provider. Your dose may need to be changed. What may interact with this medicine? -acetazolamide -amantadine -cimetidine -dextromethorphan -dofetilide -hydrochlorothiazide -ketamine -metformin -methazolamide -quinidine -ranitidine -sodium bicarbonate -triamterene This list may not describe all possible interactions. Give your health care provider a list of all the medicines, herbs, non-prescription drugs, or dietary supplements you use. Also tell them if you smoke, drink alcohol, or use illegal drugs. Some items may interact with your medicine. What should I watch for while using this medicine? Visit your doctor or health care professional for regular checks on your progress. Check with your doctor or health care professional if there is no improvement in your symptoms or if they get worse. You may get drowsy or dizzy. Do not drive, use machinery, or do anything that needs mental alertness until you know how this drug affects you. Do not stand or sit up quickly, especially if you are an older patient. This reduces the risk of dizzy or fainting spells. Alcohol can make you more drowsy and dizzy. Avoid alcoholic drinks. What side effects may I notice from receiving this medicine? Side effects that you should report to your doctor or health care professional as soon as possible: -allergic reactions like skin rash, itching or hives, swelling of the face, lips, or tongue -agitation or a feeling of restlessness -  depressed mood -dizziness -hallucinations -redness, blistering, peeling or loosening of the skin, including inside the mouth -seizures -vomiting Side effects that usually do not require medical attention (report to your doctor or health care professional if they  continue or are bothersome): -constipation -diarrhea -headache -nausea -trouble sleeping This list may not describe all possible side effects. Call your doctor for medical advice about side effects. You may report side effects to FDA at 1-800-FDA-1088. Where should I keep my medicine? Keep out of the reach of children. Store at room temperature between 15 degrees and 30 degrees C (59 degrees and 86 degrees F). Throw away any unused medicine after the expiration date. NOTE: This sheet is a summary. It may not cover all possible information. If you have questions about this medicine, talk to your doctor, pharmacist, or health care provider.  2018 Elsevier/Gold Standard (2013-05-05 14:10:42)

## 2018-03-12 NOTE — Progress Notes (Signed)
I have read the note, and I agree with the clinical assessment and plan.  Joss Mcdill K Jozie Wulf   

## 2018-04-11 ENCOUNTER — Other Ambulatory Visit: Payer: Self-pay | Admitting: Adult Health

## 2018-04-11 ENCOUNTER — Other Ambulatory Visit: Payer: Self-pay

## 2018-04-11 MED ORDER — MEMANTINE HCL 10 MG PO TABS: 10.0000 mg | ORAL_TABLET | Freq: Two times a day (BID) | ORAL | 2 refills | Status: DC

## 2018-04-15 ENCOUNTER — Ambulatory Visit (INDEPENDENT_AMBULATORY_CARE_PROVIDER_SITE_OTHER): Payer: Medicare Other | Admitting: *Deleted

## 2018-04-15 DIAGNOSIS — I495 Sick sinus syndrome: Secondary | ICD-10-CM | POA: Diagnosis not present

## 2018-04-15 NOTE — Progress Notes (Signed)
Remote pacemaker transmission.   

## 2018-05-13 DIAGNOSIS — H353113 Nonexudative age-related macular degeneration, right eye, advanced atrophic without subfoveal involvement: Secondary | ICD-10-CM | POA: Diagnosis not present

## 2018-05-16 LAB — CUP PACEART REMOTE DEVICE CHECK
Battery Remaining Percentage: 95.5 %
Battery Voltage: 2.98 V
Brady Statistic AP VP Percent: 1 %
Brady Statistic AS VP Percent: 1 %
Brady Statistic AS VS Percent: 80 %
Brady Statistic RA Percent Paced: 19 %
Implantable Lead Implant Date: 20140414
Implantable Lead Implant Date: 20140414
Implantable Lead Location: 753859
Implantable Lead Location: 753860
Implantable Pulse Generator Implant Date: 20140414
Lead Channel Impedance Value: 390 Ohm
Lead Channel Pacing Threshold Amplitude: 0.5 V
Lead Channel Pacing Threshold Pulse Width: 0.4 ms
Lead Channel Pacing Threshold Pulse Width: 0.4 ms
Lead Channel Sensing Intrinsic Amplitude: 12 mV
Lead Channel Sensing Intrinsic Amplitude: 3 mV
Lead Channel Setting Pacing Amplitude: 0.75 V
Lead Channel Setting Sensing Sensitivity: 2 mV
MDC IDC MSMT BATTERY REMAINING LONGEVITY: 141 mo
MDC IDC MSMT LEADCHNL RA IMPEDANCE VALUE: 390 Ohm
MDC IDC MSMT LEADCHNL RV PACING THRESHOLD AMPLITUDE: 0.5 V
MDC IDC PG SERIAL: 7437302
MDC IDC SESS DTM: 20190916070327
MDC IDC SET LEADCHNL RA PACING AMPLITUDE: 1.5 V
MDC IDC SET LEADCHNL RV PACING PULSEWIDTH: 0.4 ms
MDC IDC STAT BRADY AP VS PERCENT: 20 %
MDC IDC STAT BRADY RV PERCENT PACED: 1 %

## 2018-06-11 DIAGNOSIS — D696 Thrombocytopenia, unspecified: Secondary | ICD-10-CM | POA: Diagnosis not present

## 2018-06-11 DIAGNOSIS — R946 Abnormal results of thyroid function studies: Secondary | ICD-10-CM | POA: Diagnosis not present

## 2018-06-11 DIAGNOSIS — Z0001 Encounter for general adult medical examination with abnormal findings: Secondary | ICD-10-CM | POA: Diagnosis not present

## 2018-06-11 DIAGNOSIS — Z1389 Encounter for screening for other disorder: Secondary | ICD-10-CM | POA: Diagnosis not present

## 2018-06-11 DIAGNOSIS — G608 Other hereditary and idiopathic neuropathies: Secondary | ICD-10-CM | POA: Diagnosis not present

## 2018-06-11 DIAGNOSIS — Z0000 Encounter for general adult medical examination without abnormal findings: Secondary | ICD-10-CM | POA: Diagnosis not present

## 2018-06-11 DIAGNOSIS — Z6824 Body mass index (BMI) 24.0-24.9, adult: Secondary | ICD-10-CM | POA: Diagnosis not present

## 2018-07-15 ENCOUNTER — Ambulatory Visit (INDEPENDENT_AMBULATORY_CARE_PROVIDER_SITE_OTHER): Payer: Medicare Other

## 2018-07-15 DIAGNOSIS — I495 Sick sinus syndrome: Secondary | ICD-10-CM | POA: Diagnosis not present

## 2018-07-15 NOTE — Progress Notes (Signed)
Remote pacemaker transmission.   

## 2018-08-02 ENCOUNTER — Other Ambulatory Visit (HOSPITAL_COMMUNITY): Payer: Medicare Other

## 2018-08-09 ENCOUNTER — Ambulatory Visit (HOSPITAL_COMMUNITY): Payer: Medicare Other | Admitting: Hematology

## 2018-08-22 DIAGNOSIS — N390 Urinary tract infection, site not specified: Secondary | ICD-10-CM | POA: Diagnosis not present

## 2018-08-25 LAB — CUP PACEART REMOTE DEVICE CHECK
Brady Statistic AP VP Percent: 1 %
Brady Statistic AP VS Percent: 17 %
Brady Statistic AS VP Percent: 1 %
Brady Statistic AS VS Percent: 82 %
Brady Statistic RV Percent Paced: 1 %
Date Time Interrogation Session: 20191216073430
Implantable Lead Implant Date: 20140414
Implantable Lead Location: 753859
Implantable Pulse Generator Implant Date: 20140414
Lead Channel Impedance Value: 360 Ohm
Lead Channel Impedance Value: 410 Ohm
Lead Channel Pacing Threshold Amplitude: 0.75 V
Lead Channel Pacing Threshold Pulse Width: 0.4 ms
Lead Channel Sensing Intrinsic Amplitude: 12 mV
Lead Channel Sensing Intrinsic Amplitude: 2.8 mV
Lead Channel Setting Pacing Amplitude: 1 V
Lead Channel Setting Pacing Pulse Width: 0.4 ms
MDC IDC LEAD IMPLANT DT: 20140414
MDC IDC LEAD LOCATION: 753860
MDC IDC MSMT BATTERY REMAINING LONGEVITY: 140 mo
MDC IDC MSMT BATTERY REMAINING PERCENTAGE: 95.5 %
MDC IDC MSMT BATTERY VOLTAGE: 2.98 V
MDC IDC MSMT LEADCHNL RA PACING THRESHOLD AMPLITUDE: 0.5 V
MDC IDC MSMT LEADCHNL RA PACING THRESHOLD PULSEWIDTH: 0.4 ms
MDC IDC SET LEADCHNL RA PACING AMPLITUDE: 1.5 V
MDC IDC SET LEADCHNL RV SENSING SENSITIVITY: 2 mV
MDC IDC STAT BRADY RA PERCENT PACED: 16 %
Pulse Gen Model: 2210
Pulse Gen Serial Number: 7437302

## 2018-08-28 DIAGNOSIS — N390 Urinary tract infection, site not specified: Secondary | ICD-10-CM | POA: Diagnosis not present

## 2018-09-25 ENCOUNTER — Ambulatory Visit: Payer: Medicare Other | Admitting: Neurology

## 2018-10-06 NOTE — Progress Notes (Signed)
PATIENT: Pamela [name] DOB: [birth year]  REASON FOR VISIT: follow up HISTORY FROM: patient  HISTORY OF PRESENT ILLNESS: Today 10/07/18  Pamela [name] is a 85 year old female who presents for follow-up with a history of memory disturbance and neuropathy.  She is currently taking Aricept 10 mg at bedtime, Namenda 10 mg twice a day.  She currently lives alone however she has caregivers who come to help with cooking, bathing, housework.  She also has her daughter-in-law and son nearby.  She does not drive a car.  Her son manages her finances. She has help with her medications.  She does use a walker however she reports she has not had any falls.  She reports she has macular degeneration and it is hard for her to watch TV or read.  She enjoys visiting with friends, family and going out with her daughter-in-law.  Her daughter-in-law reports that has seen a change in her memory.  She reports her peripheral neuropathy has been doing well and that she does not have to use her lidocaine cream very often.  She denies any pain.  She denies any new problems or concerns.  She presents today for follow-up accompanied by her daughter-in-law.  In August 2019 her MMSE was 21/30.   HISTORY 03/12/2018 MM Pamela [name] is a 85 year old female with a history of peripheral neuropathy and memory disturbance.  She returns today for follow-up.  She reports that her symptoms have remained stable.  She continues daily use lidocaine gel for her neuropathy with good benefit.  She denies any falls.  She remains on Aricept for her memory.  She lives at home alone but she does have someone that comes in to help her with her housework and bathing.  She does not operate a motor vehicle.  Reports good appetite.  No change in mood or behavior.  Denies hallucinations.  She returns today for evaluation.  REVIEW OF SYSTEMS: Out of a complete 14 system review of symptoms, the patient complains only of the following symptoms, and all  other reviewed systems are negative.  Loss of vision, memory loss  ALLERGIES: Allergies  Allergen Reactions  . Gabapentin Other (See Comments)    Blurred vision, dizziness     HOME MEDICATIONS: Outpatient Medications Prior to Visit  Medication Sig Dispense Refill  . acetaminophen (TYLENOL) 500 MG tablet Take 500 mg by mouth every 6 (six) hours as needed for mild pain or moderate pain.     Marland Kitchen aspirin EC 81 MG tablet Take 81 mg by mouth every morning.     . benzonatate (TESSALON) 200 MG capsule Take 200 mg by mouth 3 (three) times daily as needed for cough.    . calcium-vitamin D (OSCAL WITH D) 500-200 MG-UNIT tablet Take 1 tablet by mouth daily.    . Cholecalciferol (VITAMIN D-3 PO) Take 1 capsule by mouth daily.    Marland Kitchen docusate sodium (COLACE) 100 MG capsule Take 1 capsule (100 mg total) by mouth 2 (two) times daily. 10 capsule 0  . donepezil (ARICEPT) 10 MG tablet TAKE ONE TABLET BY MOUTH AT BEDTIME. 30 tablet 11  . folic acid (FOLVITE) 1 MG tablet Take 1 mg by mouth daily.    . hydrALAZINE (APRESOLINE) 25 MG tablet Take 1 Tablet (25 mg) in the AM, Take 2 Tablets (50 mg) at Noon, and Take 1 Tablets (25 mg) in the PM 360 tablet 3  . levofloxacin (LEVAQUIN) 500 MG tablet   0  . lidocaine (LMX) 4 %  cream Apply 1 application topically 2 (two) times daily as needed.    . memantine (NAMENDA) 10 MG tablet Take 1 tablet (10 mg total) by mouth 2 (two) times daily. 180 tablet 2  . Multiple Vitamins-Minerals (OCUVITE PO) Take 1 tablet by mouth 2 (two) times daily.    . nitroGLYCERIN (NITROSTAT) 0.4 MG SL tablet Place 1 tablet (0.4 mg total) under the tongue every 5 (five) minutes as needed for chest pain. 100 tablet 3  . Polyethyl Glycol-Propyl Glycol (SYSTANE OP) Apply 1 drop to eye daily as needed (Dry Eyes).     . carvedilol (COREG) 12.5 MG tablet Take 1 tablet (12.5 mg total) by mouth 2 (two) times daily. 180 tablet 3  . ramipril (ALTACE) 5 MG capsule Take 1 capsule (5 mg total) by mouth daily.  90 capsule 3   No facility-administered medications prior to visit.     PAST MEDICAL HISTORY: Past Medical History:  Diagnosis Date  . Coronary artery disease    stent in the LAD artery in 2002  . Gait disorder 03/05/2017  . HTN (hypertension) 08/30/2012  . Hyperlipidemia 08/30/2012  . ICD (implantable cardiac defibrillator) in place   . Macular degeneration   . Pacemaker 08/29/2012   st jude accent DR RF device, model number [id number], serial number [id number], implanted 08/29/2012 for sinus bradycardia, runs of supraventricular tachycardia  last checked 10/23/2012  . Polyneuropathy in other diseases classified elsewhere (HCC)   . Shingles 08/2016  . SSS (sick sinus syndrome) with PAT and marked bradycardia with 3 sec pauses  08/30/2012  . Status post placement of cardiac pacemaker. 08/29/12 St. Jude device 08/30/2012  . Venous insufficiency     PAST SURGICAL HISTORY: Past Surgical History:  Procedure Laterality Date  . ABDOMINAL HYSTERECTOMY    . basal cell  06/2017   R side of face.  Marland Kitchen CARDIAC CATHETERIZATION  2006   3 stents placed  . CARDIAC CATHETERIZATION  06/2001   placement of BiodivYsio 2.5.10mm stent dilated to 2.25mm in proximal left anterior descenting stenotic lesion  . CESAREAN SECTION    . COLONOSCOPY W/ POLYPECTOMY    . HIP ARTHROPLASTY Left 01/04/2014   Procedure: ARTHROPLASTY BIPOLAR HIP;  Surgeon: Shelda Pal, MD;  Location: [facility] OR;  Service: Orthopedics;  Laterality: Left;  . INSERT / REPLACE / REMOVE PACEMAKER  08/29/2012   st jude   . KIDNEY STONE SURGERY    . OPEN REDUCTION INTERNAL FIXATION (ORIF) DISTAL RADIAL FRACTURE Right 04/01/2016   Procedure: OPEN REDUCTION INTERNAL FIXATION (ORIF) RIGHT DISTAL RADIAL FRACTURE AND REPAIR;  Surgeon: Bradly Bienenstock, MD;  Location: [facility] OR;  Service: Orthopedics;  Laterality: Right;  . PERMANENT PACEMAKER INSERTION N/A 08/29/2012   Procedure: PERMANENT PACEMAKER INSERTION;  Surgeon: Thurmon Fair, MD;  Location: [facility] CATH LAB;   Service: Cardiovascular;  Laterality: N/A;  . RADIOFREQUENCY ABLATION  08/25/2010   ablation of left greater saphenous vein  . ROTATOR CUFF REPAIR Left   . ROTATOR CUFF REPAIR Right   . TOTAL HIP REVISION Left 07/24/2015   Procedure: ORIF LEFT PERIPROSTHETIC FEMUR FRACTURE; CONVERSION TO TOTAL HIP ARTHROPLASTY POSS. FEMORAL COMPONENT REVISION ;  Surgeon: Samson Frederic, MD;  Location: [facility] OR;  Service: Orthopedics;  Laterality: Left;    FAMILY HISTORY: Family History  Problem Relation Age of Onset  . Heart disease Mother   . Congestive Heart Failure Mother   . Heart disease Father   . Heart attack Father   . Heart disease Brother  SOCIAL HISTORY: Social History   Socioeconomic History  . Marital status: Widowed    Spouse name: Not on file  . Number of children: 2  . Years of education: Not on file  . Highest education level: Not on file  Occupational History    Employer: RETIRED  Social Needs  . Financial resource strain: Not on file  . Food insecurity:    Worry: Not on file    Inability: Not on file  . Transportation needs:    Medical: Not on file    Non-medical: Not on file  Tobacco Use  . Smoking status: Never Smoker  . Smokeless tobacco: Never Used  Substance and Sexual Activity  . Alcohol use: No    Alcohol/week: 0.0 standard drinks  . Drug use: No  . Sexual activity: Not on file  Lifestyle  . Physical activity:    Days per week: Not on file    Minutes per session: Not on file  . Stress: Not on file  Relationships  . Social connections:    Talks on phone: Not on file    Gets together: Not on file    Attends religious service: Not on file    Active member of club or organization: Not on file    Attends meetings of clubs or organizations: Not on file    Relationship status: Not on file  . Intimate partner violence:    Fear of current or ex partner: Not on file    Emotionally abused: Not on file    Physically abused: Not on file    Forced sexual  activity: Not on file  Other Topics Concern  . Not on file  Social History Narrative   Patient drinks about 2 cups of caffeine daily.   Patient is right handed.      PHYSICAL EXAM  Vitals:   10/07/18 1304  BP: (!) 156/73  Pulse: 65  Weight: 146 lb 6.4 oz (66.4 kg)  Height: 5\' 6"  (1.676 m)   Body mass index is 23.63 kg/m.  Generalized: Well developed, in no acute distress  MMSE - Mini Mental State Exam 10/07/2018 03/12/2018 09/10/2017  Not completed: (No Data) - -  Orientation to time 2 4 3   Orientation to Place 2 4 5   Registration 3 2 3   Attention/ Calculation 4 2 3   Recall 0 2 2  Language- name 2 objects 2 2 2   Language- repeat 1 1 0  Language- follow 3 step command 2 2 3   Language- follow 3 step command-comments She grabbed the paper with both hands - -  Language- read & follow direction 1 1 1   Write a sentence 1 1 1   Copy design 0 0 0  Total score 18 21 23    Neurological examination  Mentation: Alert oriented to time, place, Relies on daughter-in-law for some history. Follows all commands speech and language fluent Cranial nerve II-XII: Pupils were equal round reactive to light. Extraocular movements were full, visual field were full on confrontational test. Facial sensation and strength were normal. Uvula tongue midline.  Motor: The motor testing reveals 4 over 5 strength of all 4 extremities. Good symmetric motor tone is noted throughout.  Sensory: Sensory testing is intact to soft touch on all 4 extremities.  Decreased vibration sense to bilateral lower extremities no evidence of extinction is noted.  Coordination: Cerebellar testing reveals good finger-nose-finger and heel-to-shin bilaterally.  Gait and station: Gait is slow, unsteady, using a walker,  Reflexes: Deep tendon reflexes are  symmetric and normal bilaterally.   DIAGNOSTIC DATA (LABS, IMAGING, TESTING) - I reviewed patient records, labs, notes, testing and imaging myself where available.  Lab Results    Component Value Date   WBC 3.0 (L) 01/17/2018   HGB 11.4 (L) 01/17/2018   HCT 35.7 (L) 01/17/2018   MCV 95.5 01/17/2018   PLT 112 (L) 01/17/2018      Component Value Date/Time   NA 139 01/17/2018 1206   K 3.6 01/17/2018 1206   CL 105 01/17/2018 1206   CO2 26 01/17/2018 1206   GLUCOSE 107 (H) 01/17/2018 1206   BUN 17 01/17/2018 1206   CREATININE 0.95 01/17/2018 1206   CREATININE 0.82 04/27/2016 1452   CALCIUM 9.3 01/17/2018 1206   PROT 6.3 (L) 01/17/2018 1206   ALBUMIN 4.0 01/17/2018 1206   AST 23 01/17/2018 1206   ALT 16 01/17/2018 1206   ALKPHOS 49 01/17/2018 1206   BILITOT 0.7 01/17/2018 1206   GFRNONAA 49 (L) 01/17/2018 1206   GFRAA 57 (L) 01/17/2018 1206   Lab Results  Component Value Date   CHOL 158 04/09/2013   HDL 63 04/09/2013   LDLCALC 75 04/09/2013   TRIG 98 04/09/2013   CHOLHDL 2.5 04/09/2013   No results found for: HGBA1C Lab Results  Component Value Date   VITAMINB12 818 06/26/2017   Lab Results  Component Value Date   TSH 1.501 08/13/2013      ASSESSMENT AND PLAN 85 y.o. year old female  has a past medical history of Coronary artery disease, Gait disorder (03/05/2017), HTN (hypertension) (08/30/2012), Hyperlipidemia (08/30/2012), ICD (implantable cardiac defibrillator) in place, Macular degeneration, Pacemaker (08/29/2012), Polyneuropathy in other diseases classified elsewhere [facility]), Shingles (08/2016), SSS (sick sinus syndrome) with PAT and marked bradycardia with 3 sec pauses  (08/30/2012), Status post placement of cardiac pacemaker. 08/29/12 St. Jude device (08/30/2012), and Venous insufficiency. here with:  1.  Memory disturbance 2.  Peripheral neuropathy  I had a very pleasant visit with Ms. Haberman and her daughter-in-law.  Her memory has declined slightly with a MMSE of 18/30 today.  Her daughter-in-law reports they have seen a change in her memory.  She reports her memory is doing fine.  She will continue taking Aricept and Namenda. She is  tolerating the medication well. She currently has a good support system with caregivers and family nearby.  She is not having any problems or pain with her neuropathy.  She is not having to use lidocaine cream very often.  She will follow-up in 6 months or sooner if needed.  I advised that if her symptoms worsen or she develops any new symptoms she should let us know.   I spent 15 minutes with the patient. 50% of this time was spent discussing her plan of care   Otila Kluver, DNP 10/07/2018, 1:49 PM [facility] [address], [address] [place], [place] [address] [phone]

## 2018-10-07 ENCOUNTER — Encounter: Payer: Self-pay | Admitting: Neurology

## 2018-10-07 ENCOUNTER — Ambulatory Visit: Payer: Medicare Other | Admitting: Neurology

## 2018-10-07 VITALS — BP 156/73 | HR 65 | Ht 66.0 in | Wt 146.4 lb

## 2018-10-07 DIAGNOSIS — R413 Other amnesia: Secondary | ICD-10-CM

## 2018-10-07 NOTE — Progress Notes (Signed)
I have read the note, and I agree with the clinical assessment and plan.  Pamela [name] K Pamela [name]   

## 2018-10-14 ENCOUNTER — Ambulatory Visit (INDEPENDENT_AMBULATORY_CARE_PROVIDER_SITE_OTHER): Payer: Medicare Other | Admitting: *Deleted

## 2018-10-14 DIAGNOSIS — R001 Bradycardia, unspecified: Secondary | ICD-10-CM

## 2018-10-14 DIAGNOSIS — I495 Sick sinus syndrome: Secondary | ICD-10-CM

## 2018-10-15 ENCOUNTER — Telehealth: Payer: Self-pay

## 2018-10-15 NOTE — Telephone Encounter (Signed)
Left message for patient to remind of missed remote transmission.  

## 2018-10-16 ENCOUNTER — Other Ambulatory Visit: Payer: Self-pay | Admitting: Adult Health

## 2018-10-16 LAB — CUP PACEART REMOTE DEVICE CHECK
Battery Remaining Longevity: 138 mo
Battery Remaining Percentage: 95.5 %
Battery Voltage: 2.98 V
Brady Statistic RA Percent Paced: 16 %
Brady Statistic RV Percent Paced: 1 %
Date Time Interrogation Session: 20200318001051
Implantable Lead Implant Date: 20140414
Implantable Lead Location: 753859
Implantable Lead Location: 753860
Implantable Pulse Generator Implant Date: 20140414
Lead Channel Impedance Value: 390 Ohm
Lead Channel Pacing Threshold Amplitude: 0.75 V
Lead Channel Pacing Threshold Amplitude: 1.5 V
Lead Channel Pacing Threshold Pulse Width: 0.4 ms
Lead Channel Sensing Intrinsic Amplitude: 2.9 mV
Lead Channel Setting Pacing Pulse Width: 0.4 ms
Lead Channel Setting Sensing Sensitivity: 2 mV
MDC IDC LEAD IMPLANT DT: 20140414
MDC IDC MSMT LEADCHNL RV IMPEDANCE VALUE: 430 Ohm
MDC IDC MSMT LEADCHNL RV PACING THRESHOLD PULSEWIDTH: 0.4 ms
MDC IDC MSMT LEADCHNL RV SENSING INTR AMPL: 12 mV
MDC IDC SET LEADCHNL RA PACING AMPLITUDE: 2.5 V
MDC IDC SET LEADCHNL RV PACING AMPLITUDE: 1 V
MDC IDC STAT BRADY AP VP PERCENT: 1 %
MDC IDC STAT BRADY AP VS PERCENT: 17 %
MDC IDC STAT BRADY AS VP PERCENT: 1 %
MDC IDC STAT BRADY AS VS PERCENT: 83 %
Pulse Gen Model: 2210
Pulse Gen Serial Number: 7437302

## 2018-10-21 NOTE — Progress Notes (Signed)
Remote pacemaker transmission.   

## 2018-10-23 ENCOUNTER — Other Ambulatory Visit: Payer: Self-pay | Admitting: Internal Medicine

## 2018-11-19 DIAGNOSIS — N390 Urinary tract infection, site not specified: Secondary | ICD-10-CM | POA: Diagnosis not present

## 2018-11-22 ENCOUNTER — Encounter: Payer: Medicare Other | Admitting: Internal Medicine

## 2018-11-27 DIAGNOSIS — N390 Urinary tract infection, site not specified: Secondary | ICD-10-CM | POA: Diagnosis not present

## 2018-12-02 ENCOUNTER — Other Ambulatory Visit: Payer: Self-pay | Admitting: Internal Medicine

## 2019-01-06 ENCOUNTER — Other Ambulatory Visit: Payer: Self-pay | Admitting: Adult Health

## 2019-01-13 ENCOUNTER — Ambulatory Visit (INDEPENDENT_AMBULATORY_CARE_PROVIDER_SITE_OTHER): Payer: Medicare Other | Admitting: *Deleted

## 2019-01-13 DIAGNOSIS — I495 Sick sinus syndrome: Secondary | ICD-10-CM | POA: Diagnosis not present

## 2019-01-13 LAB — CUP PACEART REMOTE DEVICE CHECK
Battery Remaining Longevity: 140 mo
Battery Remaining Percentage: 95.5 %
Battery Voltage: 2.98 V
Brady Statistic AP VP Percent: 1 %
Brady Statistic AP VS Percent: 16 %
Brady Statistic AS VP Percent: 1 %
Brady Statistic AS VS Percent: 84 %
Brady Statistic RA Percent Paced: 15 %
Brady Statistic RV Percent Paced: 1 %
Date Time Interrogation Session: 20200615060018
Implantable Lead Implant Date: 20140414
Implantable Lead Implant Date: 20140414
Implantable Lead Location: 753859
Implantable Lead Location: 753860
Implantable Pulse Generator Implant Date: 20140414
Lead Channel Impedance Value: 400 Ohm
Lead Channel Impedance Value: 400 Ohm
Lead Channel Pacing Threshold Amplitude: 0.5 V
Lead Channel Pacing Threshold Amplitude: 0.75 V
Lead Channel Pacing Threshold Pulse Width: 0.4 ms
Lead Channel Pacing Threshold Pulse Width: 0.4 ms
Lead Channel Sensing Intrinsic Amplitude: 12 mV
Lead Channel Sensing Intrinsic Amplitude: 2.4 mV
Lead Channel Setting Pacing Amplitude: 1 V
Lead Channel Setting Pacing Amplitude: 1.5 V
Lead Channel Setting Pacing Pulse Width: 0.4 ms
Lead Channel Setting Sensing Sensitivity: 2 mV
Pulse Gen Model: 2210
Pulse Gen Serial Number: 7437302

## 2019-01-20 ENCOUNTER — Encounter: Payer: Self-pay | Admitting: Cardiology

## 2019-01-20 NOTE — Progress Notes (Signed)
Remote pacemaker transmission.   

## 2019-01-23 ENCOUNTER — Other Ambulatory Visit (HOSPITAL_COMMUNITY): Payer: Medicare Other

## 2019-01-24 ENCOUNTER — Other Ambulatory Visit (HOSPITAL_COMMUNITY): Payer: Medicare Other

## 2019-01-27 ENCOUNTER — Ambulatory Visit (HOSPITAL_COMMUNITY): Payer: Medicare Other | Admitting: Hematology

## 2019-01-31 ENCOUNTER — Other Ambulatory Visit: Payer: Self-pay | Admitting: Internal Medicine

## 2019-02-27 ENCOUNTER — Telehealth: Payer: Self-pay | Admitting: Internal Medicine

## 2019-02-27 NOTE — Telephone Encounter (Signed)
New message   Is it absolutely necessary for patient to come in for appt ?   They would like to wait until this covid 19 is over , they dont want to risk her health.  Please call

## 2019-03-19 ENCOUNTER — Encounter: Payer: Self-pay | Admitting: Internal Medicine

## 2019-03-19 ENCOUNTER — Telehealth (INDEPENDENT_AMBULATORY_CARE_PROVIDER_SITE_OTHER): Payer: Medicare Other | Admitting: Internal Medicine

## 2019-03-19 VITALS — BP 159/72 | HR 63 | Ht 67.0 in | Wt 150.0 lb

## 2019-03-19 DIAGNOSIS — I495 Sick sinus syndrome: Secondary | ICD-10-CM | POA: Diagnosis not present

## 2019-03-19 DIAGNOSIS — Z950 Presence of cardiac pacemaker: Secondary | ICD-10-CM

## 2019-03-19 NOTE — Progress Notes (Signed)
Electrophysiology TeleHealth Note   Due to national recommendations of social distancing due to COVID 19, an audio/video telehealth visit is felt to be most appropriate for this patient at this time.  See MyChart message from today for the patient's consent to telehealth for [facility].   Date:  03/19/2019   ID:  Pamela [name], DOB 26-Jun-1921, MRN [id number]  Location: patient's home  Provider location: [address], [place]  Evaluation Performed: Follow-up visit  PCP:  Elfredia NevinsFusco, Lawrence, MD  Cardiologist:  No primary care provider on file.  Electrophysiologist:  Dr Ladona Ridgelaylor  Chief Complaint:  " I am feeling ok. Everything's fine."  History of Present Illness:    Pamela [name] is a 85 y.o. female who presents via audio/video conferencing for a telehealth visit today.  Since last being seen in our clinic, the patient reports doing very well.  Today, she denies symptoms of palpitations, chest pain, shortness of breath,  lower extremity edema, dizziness, presyncope, or syncope.  The patient is otherwise without complaint today.  The patient denies symptoms of fevers, chills, cough, or new SOB worrisome for COVID 19.  Past Medical History:  Diagnosis Date  . Coronary artery disease    stent in the LAD artery in 2002  . Gait disorder 03/05/2017  . HTN (hypertension) 08/30/2012  . Hyperlipidemia 08/30/2012  . ICD (implantable cardiac defibrillator) in place   . Macular degeneration   . Pacemaker 08/29/2012   st jude accent DR RF device, model number [id number], serial number [id number], implanted 08/29/2012 for sinus bradycardia, runs of supraventricular tachycardia  last checked 10/23/2012  . Polyneuropathy in other diseases classified elsewhere (HCC)   . Shingles 08/2016  . SSS (sick sinus syndrome) with PAT and marked bradycardia with 3 sec pauses  08/30/2012  . Status post placement of cardiac pacemaker. 08/29/12 St. Jude device 08/30/2012  . Venous insufficiency      Past Surgical History:  Procedure Laterality Date  . ABDOMINAL HYSTERECTOMY    . basal cell  06/2017   R side of face.  Marland Kitchen. CARDIAC CATHETERIZATION  2006   3 stents placed  . CARDIAC CATHETERIZATION  06/2001   placement of BiodivYsio 2.5.10mm stent dilated to 2.4875mm in proximal left anterior descenting stenotic lesion  . CESAREAN SECTION    . COLONOSCOPY W/ POLYPECTOMY    . HIP ARTHROPLASTY Left 01/04/2014   Procedure: ARTHROPLASTY BIPOLAR HIP;  Surgeon: Shelda PalMatthew D Olin, MD;  [facility];  Service: Orthopedics;  Laterality: Left;  . INSERT / REPLACE / REMOVE PACEMAKER  08/29/2012   st jude   . KIDNEY STONE SURGERY    . OPEN REDUCTION INTERNAL FIXATION (ORIF) DISTAL RADIAL FRACTURE Right 04/01/2016   Procedure: OPEN REDUCTION INTERNAL FIXATION (ORIF) RIGHT DISTAL RADIAL FRACTURE AND REPAIR;  Surgeon: Bradly BienenstockFred Ortmann, MD;  Location: [facility] OR;  Service: Orthopedics;  Laterality: Right;  . PERMANENT PACEMAKER INSERTION N/A 08/29/2012   Procedure: PERMANENT PACEMAKER INSERTION;  Surgeon: Thurmon FairMihai Croitoru, MD;  Location: [facility] CATH LAB;  Service: Cardiovascular;  Laterality: N/A;  . RADIOFREQUENCY ABLATION  08/25/2010   ablation of left greater saphenous vein  . ROTATOR CUFF REPAIR Left   . ROTATOR CUFF REPAIR Right   . TOTAL HIP REVISION Left 07/24/2015   Procedure: ORIF LEFT PERIPROSTHETIC FEMUR FRACTURE; CONVERSION TO TOTAL HIP ARTHROPLASTY POSS. FEMORAL COMPONENT REVISION ;  Surgeon: Samson FredericBrian Swinteck, MD;  [facility];  Service: Orthopedics;  Laterality: Left;    Current Outpatient Medications  Medication Sig Dispense Refill  . acetaminophen (TYLENOL) 500 MG tablet Take 500 mg by mouth every 6 (six) hours as needed for mild pain or moderate pain.     Marland Kitchen aspirin EC 81 MG tablet Take 81 mg by mouth every morning.     . calcium-vitamin D (OSCAL WITH D) 500-200 MG-UNIT tablet Take 1 tablet by mouth daily.    . carvedilol (COREG) 12.5 MG tablet Take 1 tablet (12.5 mg total) by mouth 2 (two) times daily.  180 tablet 3  . Cholecalciferol (VITAMIN D-3 PO) Take 1 capsule by mouth daily.    Marland Kitchen docusate sodium (COLACE) 100 MG capsule Take 1 capsule (100 mg total) by mouth 2 (two) times daily. 10 capsule 0  . donepezil (ARICEPT) 10 MG tablet TAKE ONE TABLET BY MOUTH EVERY NIGHT AT BEDTIME 30 tablet 11  . folic acid (FOLVITE) 1 MG tablet Take 1 mg by mouth daily.    . hydrALAZINE (APRESOLINE) 25 MG tablet TAKE 1 TABLET IN THE MORNING, THEN 2 TABLETS AT NOON, AND 1 TABLET INTHE EVENING 360 tablet 0  . lidocaine (LMX) 4 % cream Apply 1 application topically 2 (two) times daily as needed.    . memantine (NAMENDA) 10 MG tablet TAKE ONE TABLET (10MG  TOTAL) BY MOUTH TWO TIMES DAILY 180 tablet 1  . Multiple Vitamins-Minerals (OCUVITE PO) Take 1 tablet by mouth 2 (two) times daily.    . nitroGLYCERIN (NITROSTAT) 0.4 MG SL tablet Place 1 tablet (0.4 mg total) under the tongue every 5 (five) minutes as needed for chest pain. 100 tablet 3  . Polyethyl Glycol-Propyl Glycol (SYSTANE OP) Apply 1 drop to eye daily as needed (Dry Eyes).     . ramipril (ALTACE) 5 MG capsule TAKE ONE CAPSULE (5MG  TOTAL) BY MOUTH DAILY 90 capsule 3   No current facility-administered medications for this visit.     Allergies:   Gabapentin   Social History:  The patient  reports that she has never smoked. She has never used smokeless tobacco. She reports that she does not drink alcohol or use drugs.   Family History:  The patient's  family history includes Congestive Heart Failure in her mother; Heart attack in her father; Heart disease in her brother, father, and mother.   ROS:  Please see the history of present illness.   All other systems are personally reviewed and negative.    Exam:    Vital Signs:  BP (!) 159/72   Pulse 63   Ht 5\' 7"  (1.702 m)   Wt 150 lb (68 kg)   BMI 23.49 kg/m   Well appearing, alert and conversant, regular work of breathing,  good skin color Eyes- anicteric, neuro- grossly intact, skin- no apparent  rash or lesions or cyanosis, mouth- oral mucosa is pink   Labs/Other Tests and Data Reviewed:    Recent Labs: No results found for requested labs within last 8760 hours.   Wt Readings from Last 3 Encounters:  03/19/19 150 lb (68 kg)  10/07/18 146 lb 6.4 oz (66.4 kg)  03/12/18 132 lb (59.9 kg)     Other studies personally reviewed:  Last device remote is reviewed from [facility] PDF dated 6/20 which reveals normal device function, no arrhythmias    ASSESSMENT & PLAN:    1.  PAF - she is maintaining NSR. She will continue her current meds. She is a poor candidate for systemic anti-coagulation. 2. HTN - her systolic pressure runs high but I would like to keep  it a little high.  3. PPM - her St. Jude DDD PM was last check in June with normal function.    COVID 19 screen The patient denies symptoms of COVID 19 at this time.  The importance of social distancing was discussed today.  Follow-up:  One year Next remote: 9/20  Current medicines are reviewed at length with the patient today.   The patient does not have concerns regarding her medicines.  The following changes were made today:  none  Labs/ tests ordered today include: none No orders of the defined types were placed in this encounter.    Patient Risk:  after full review of this patients clinical status, I feel that they are at moderate risk at this time.  Today, I have spent 15 minutes with the patient with telehealth technology discussing all of the above .    Signed, Lewayne BuntingGregg Taylor, MD  03/19/2019 1:23 PM     [facility] [address] [place] [address] [phone] (office) [phone] (fax)

## 2019-03-19 NOTE — Patient Instructions (Signed)
Medication Instructions:  Your physician recommends that you continue on your current medications as directed. Please refer to the Current Medication list given to you today.  If you need a refill on your cardiac medications before your next appointment, please call your pharmacy.   Lab work: NONE  If you have labs (blood work) drawn today and your tests are completely normal, you will receive your results only by: . MyChart Message (if you have MyChart) OR . A paper copy in the mail If you have any lab test that is abnormal or we need to change your treatment, we will call you to review the results.  Testing/Procedures: NONE   Follow-Up: At [facility], you and your health needs are our priority.  As part of our continuing mission to provide you with exceptional heart care, we have created designated Provider Care Teams.  These Care Teams include your primary Cardiologist (physician) and Advanced Practice Providers (APPs -  Physician Assistants and Nurse Practitioners) who all work together to provide you with the care you need, when you need it. You will need a follow up appointment in 1 years.  Please call our office 2 months in advance to schedule this appointment.  You may see No primary care provider on file. or one of the following Advanced Practice Providers on your designated Care Team:   Brittany Strader, PA-C ([place] Office) . Michele Lenze, PA-C ([place] Office)  Any Other Special Instructions Will Be Listed Below (If Applicable). Thank you for choosing [facility]!     

## 2019-03-28 ENCOUNTER — Other Ambulatory Visit: Payer: Self-pay | Admitting: Internal Medicine

## 2019-03-28 DIAGNOSIS — N390 Urinary tract infection, site not specified: Secondary | ICD-10-CM | POA: Diagnosis not present

## 2019-04-14 ENCOUNTER — Ambulatory Visit (INDEPENDENT_AMBULATORY_CARE_PROVIDER_SITE_OTHER): Payer: Medicare Other | Admitting: *Deleted

## 2019-04-14 DIAGNOSIS — I471 Supraventricular tachycardia, unspecified: Secondary | ICD-10-CM

## 2019-04-14 DIAGNOSIS — I4719 Other supraventricular tachycardia: Secondary | ICD-10-CM

## 2019-04-14 LAB — CUP PACEART REMOTE DEVICE CHECK
Battery Remaining Longevity: 139 mo
Battery Remaining Percentage: 95.5 %
Battery Voltage: 2.98 V
Brady Statistic AP VP Percent: 1 %
Brady Statistic AP VS Percent: 15 %
Brady Statistic AS VP Percent: 1 %
Brady Statistic AS VS Percent: 85 %
Brady Statistic RA Percent Paced: 14 %
Brady Statistic RV Percent Paced: 1 %
Date Time Interrogation Session: 20200914060010
Implantable Lead Implant Date: 20140414
Implantable Lead Implant Date: 20140414
Implantable Lead Location: 753859
Implantable Lead Location: 753860
Implantable Pulse Generator Implant Date: 20140414
Lead Channel Impedance Value: 380 Ohm
Lead Channel Impedance Value: 410 Ohm
Lead Channel Pacing Threshold Amplitude: 0.5 V
Lead Channel Pacing Threshold Amplitude: 0.75 V
Lead Channel Pacing Threshold Pulse Width: 0.4 ms
Lead Channel Pacing Threshold Pulse Width: 0.4 ms
Lead Channel Sensing Intrinsic Amplitude: 12 mV
Lead Channel Sensing Intrinsic Amplitude: 3.2 mV
Lead Channel Setting Pacing Amplitude: 1 V
Lead Channel Setting Pacing Amplitude: 1.5 V
Lead Channel Setting Pacing Pulse Width: 0.4 ms
Lead Channel Setting Sensing Sensitivity: 2 mV
Pulse Gen Model: 2210
Pulse Gen Serial Number: 7437302

## 2019-04-16 ENCOUNTER — Ambulatory Visit: Payer: Medicare Other | Admitting: Neurology

## 2019-04-25 NOTE — Progress Notes (Signed)
Remote pacemaker transmission.   

## 2019-04-30 DIAGNOSIS — Z23 Encounter for immunization: Secondary | ICD-10-CM | POA: Diagnosis not present

## 2019-05-06 ENCOUNTER — Other Ambulatory Visit: Payer: Self-pay | Admitting: Internal Medicine

## 2019-06-19 DIAGNOSIS — Z6827 Body mass index (BMI) 27.0-27.9, adult: Secondary | ICD-10-CM | POA: Diagnosis not present

## 2019-06-19 DIAGNOSIS — E663 Overweight: Secondary | ICD-10-CM | POA: Diagnosis not present

## 2019-06-19 DIAGNOSIS — Z1389 Encounter for screening for other disorder: Secondary | ICD-10-CM | POA: Diagnosis not present

## 2019-06-19 DIAGNOSIS — Z0001 Encounter for general adult medical examination with abnormal findings: Secondary | ICD-10-CM | POA: Diagnosis not present

## 2019-06-19 DIAGNOSIS — I10 Essential (primary) hypertension: Secondary | ICD-10-CM | POA: Diagnosis not present

## 2019-06-19 DIAGNOSIS — F0390 Unspecified dementia without behavioral disturbance: Secondary | ICD-10-CM | POA: Diagnosis not present

## 2019-07-12 ENCOUNTER — Other Ambulatory Visit: Payer: Self-pay | Admitting: Neurology

## 2019-07-14 ENCOUNTER — Ambulatory Visit (INDEPENDENT_AMBULATORY_CARE_PROVIDER_SITE_OTHER): Payer: Medicare Other | Admitting: *Deleted

## 2019-07-14 DIAGNOSIS — I495 Sick sinus syndrome: Secondary | ICD-10-CM

## 2019-07-14 LAB — CUP PACEART REMOTE DEVICE CHECK
Battery Remaining Longevity: 131 mo
Battery Remaining Percentage: 95.5 %
Battery Voltage: 2.96 V
Brady Statistic AP VP Percent: 1 %
Brady Statistic AP VS Percent: 13 %
Brady Statistic AS VP Percent: 1 %
Brady Statistic AS VS Percent: 86 %
Brady Statistic RA Percent Paced: 13 %
Brady Statistic RV Percent Paced: 1 %
Date Time Interrogation Session: 20201214020025
Implantable Lead Implant Date: 20140414
Implantable Lead Implant Date: 20140414
Implantable Lead Location: 753859
Implantable Lead Location: 753860
Implantable Pulse Generator Implant Date: 20140414
Lead Channel Impedance Value: 380 Ohm
Lead Channel Impedance Value: 430 Ohm
Lead Channel Pacing Threshold Amplitude: 0.5 V
Lead Channel Pacing Threshold Amplitude: 0.75 V
Lead Channel Pacing Threshold Pulse Width: 0.4 ms
Lead Channel Pacing Threshold Pulse Width: 0.4 ms
Lead Channel Sensing Intrinsic Amplitude: 12 mV
Lead Channel Sensing Intrinsic Amplitude: 2.3 mV
Lead Channel Setting Pacing Amplitude: 1 V
Lead Channel Setting Pacing Amplitude: 1.5 V
Lead Channel Setting Pacing Pulse Width: 0.4 ms
Lead Channel Setting Sensing Sensitivity: 2 mV
Pulse Gen Model: 2210
Pulse Gen Serial Number: 7437302

## 2019-07-17 ENCOUNTER — Other Ambulatory Visit: Payer: Self-pay

## 2019-07-17 NOTE — Telephone Encounter (Signed)
Made in error

## 2019-07-29 ENCOUNTER — Other Ambulatory Visit: Payer: Self-pay | Admitting: Internal Medicine

## 2019-08-16 NOTE — Progress Notes (Signed)
PPM remote 

## 2019-09-01 DIAGNOSIS — I872 Venous insufficiency (chronic) (peripheral): Secondary | ICD-10-CM | POA: Diagnosis not present

## 2019-09-01 DIAGNOSIS — R600 Localized edema: Secondary | ICD-10-CM | POA: Diagnosis not present

## 2019-09-08 DIAGNOSIS — Z6828 Body mass index (BMI) 28.0-28.9, adult: Secondary | ICD-10-CM | POA: Diagnosis not present

## 2019-09-08 DIAGNOSIS — R609 Edema, unspecified: Secondary | ICD-10-CM | POA: Diagnosis not present

## 2019-09-08 DIAGNOSIS — R410 Disorientation, unspecified: Secondary | ICD-10-CM | POA: Diagnosis not present

## 2019-09-08 DIAGNOSIS — R531 Weakness: Secondary | ICD-10-CM | POA: Diagnosis not present

## 2019-09-10 DIAGNOSIS — R609 Edema, unspecified: Secondary | ICD-10-CM | POA: Diagnosis not present

## 2019-09-10 DIAGNOSIS — I872 Venous insufficiency (chronic) (peripheral): Secondary | ICD-10-CM | POA: Diagnosis not present

## 2019-09-10 DIAGNOSIS — R531 Weakness: Secondary | ICD-10-CM | POA: Diagnosis not present

## 2019-09-10 DIAGNOSIS — R410 Disorientation, unspecified: Secondary | ICD-10-CM | POA: Diagnosis not present

## 2019-09-11 ENCOUNTER — Telehealth: Payer: Self-pay

## 2019-09-11 NOTE — Telephone Encounter (Signed)
Daughter took patient to Dr.Fusco 1 week ago for swollen legs and possible weight gain.Weight was 160 lbs. Was started on Lasix 20 mg twice a day and continues on it now.Told at pcp office her lungs were clear, no wheezing heard. Heart rhythm sounded normal.SaO2 was 96 %    BMET done 09/11/19 showed Sodium 139, Potassium 5.1 (she is not taking potassium), BUN 17, creatinine 0.81   Leg swelling is resolved, unsure weight as she cannot easily step on scale.  Daughter wanted Dr.Taylor to be aware.

## 2019-09-11 NOTE — Telephone Encounter (Signed)
09-11-19 Rec'd call from Daughter-Becky. Pt has a cough. Pt has seen Dr.Fusco, but she would rather has advice from Dr.Taylor.  Please call Daughter-Becky [phone]  Thanks renee

## 2019-09-19 ENCOUNTER — Other Ambulatory Visit: Payer: Self-pay

## 2019-09-19 ENCOUNTER — Emergency Department (HOSPITAL_COMMUNITY): Payer: Medicare Other

## 2019-09-19 ENCOUNTER — Emergency Department (HOSPITAL_COMMUNITY)
Admission: EM | Admit: 2019-09-19 | Discharge: 2019-09-19 | Disposition: A | Discharge status: Home / Self Care | Payer: Medicare Other | Attending: Emergency Medicine | Admitting: Emergency Medicine

## 2019-09-19 DIAGNOSIS — Z79899 Other long term (current) drug therapy: Secondary | ICD-10-CM | POA: Insufficient documentation

## 2019-09-19 DIAGNOSIS — I10 Essential (primary) hypertension: Secondary | ICD-10-CM | POA: Insufficient documentation

## 2019-09-19 DIAGNOSIS — Z20822 Contact with and (suspected) exposure to covid-19: Secondary | ICD-10-CM | POA: Diagnosis not present

## 2019-09-19 DIAGNOSIS — Z96642 Presence of left artificial hip joint: Secondary | ICD-10-CM | POA: Diagnosis not present

## 2019-09-19 DIAGNOSIS — Z950 Presence of cardiac pacemaker: Secondary | ICD-10-CM | POA: Diagnosis not present

## 2019-09-19 DIAGNOSIS — Z7982 Long term (current) use of aspirin: Secondary | ICD-10-CM | POA: Diagnosis not present

## 2019-09-19 DIAGNOSIS — I2510 Atherosclerotic heart disease of native coronary artery without angina pectoris: Secondary | ICD-10-CM | POA: Diagnosis not present

## 2019-09-19 DIAGNOSIS — Z03818 Encounter for observation for suspected exposure to other biological agents ruled out: Secondary | ICD-10-CM | POA: Diagnosis not present

## 2019-09-19 DIAGNOSIS — Z95810 Presence of automatic (implantable) cardiac defibrillator: Secondary | ICD-10-CM | POA: Insufficient documentation

## 2019-09-19 DIAGNOSIS — R4182 Altered mental status, unspecified: Secondary | ICD-10-CM

## 2019-09-19 DIAGNOSIS — R918 Other nonspecific abnormal finding of lung field: Secondary | ICD-10-CM | POA: Diagnosis not present

## 2019-09-19 LAB — COMPREHENSIVE METABOLIC PANEL
ALT: 12 U/L (ref 0–44)
AST: 15 U/L (ref 15–41)
Albumin: 3.3 g/dL — ABNORMAL LOW (ref 3.5–5.0)
Alkaline Phosphatase: 62 U/L (ref 38–126)
Anion gap: 10 (ref 5–15)
BUN: 14 mg/dL (ref 8–23)
CO2: 25 mmol/L (ref 22–32)
Calcium: 9.2 mg/dL (ref 8.9–10.3)
Chloride: 95 mmol/L — ABNORMAL LOW (ref 98–111)
Creatinine, Ser: 0.74 mg/dL (ref 0.44–1.00)
GFR calc Af Amer: >60 mL/min (ref >60)
GFR calc non Af Amer: >60 mL/min (ref >60)
Glucose, Bld: 122 mg/dL — ABNORMAL HIGH (ref 70–99)
Potassium: 4 mmol/L (ref 3.5–5.1)
Sodium: 130 mmol/L — ABNORMAL LOW (ref 135–145)
Total Bilirubin: 0.7 mg/dL (ref 0.3–1.2)
Total Protein: 5.9 g/dL — ABNORMAL LOW (ref 6.5–8.1)

## 2019-09-19 LAB — URINALYSIS, ROUTINE W REFLEX MICROSCOPIC
Bacteria, UA: NONE SEEN
Bilirubin Urine: NEGATIVE
Glucose, UA: NEGATIVE mg/dL
Hgb urine dipstick: NEGATIVE
Ketones, ur: NEGATIVE mg/dL
Nitrite: NEGATIVE
Protein, ur: NEGATIVE mg/dL
Specific Gravity, Urine: 1.004 — ABNORMAL LOW (ref 1.005–1.030)
pH: 7 (ref 5.0–8.0)

## 2019-09-19 LAB — CBC WITH DIFFERENTIAL/PLATELET
Abs Immature Granulocytes: 0.03 10*3/uL (ref 0.00–0.07)
Basophils Absolute: 0 10*3/uL (ref 0.0–0.1)
Basophils Relative: 0 %
Eosinophils Absolute: 0 10*3/uL (ref 0.0–0.5)
Eosinophils Relative: 1 %
HCT: 33 % — ABNORMAL LOW (ref 36.0–46.0)
Hemoglobin: 10.5 g/dL — ABNORMAL LOW (ref 12.0–15.0)
Immature Granulocytes: 1 %
Lymphocytes Relative: 16 %
Lymphs Abs: 0.9 10*3/uL (ref 0.7–4.0)
MCH: 28.9 pg (ref 26.0–34.0)
MCHC: 31.8 g/dL (ref 30.0–36.0)
MCV: 90.9 fL (ref 80.0–100.0)
Monocytes Absolute: 0.6 10*3/uL (ref 0.1–1.0)
Monocytes Relative: 11 %
Neutro Abs: 4.1 10*3/uL (ref 1.7–7.7)
Neutrophils Relative %: 71 %
Platelets: 241 10*3/uL (ref 150–400)
RBC: 3.63 MIL/uL — ABNORMAL LOW (ref 3.87–5.11)
RDW: 14.6 % (ref 11.5–15.5)
WBC: 5.8 10*3/uL (ref 4.0–10.5)
nRBC: 0 % (ref 0.0–0.2)

## 2019-09-19 LAB — POC SARS CORONAVIRUS 2 AG -  ED: SARS Coronavirus 2 Ag: NEGATIVE

## 2019-09-19 MED ORDER — AZITHROMYCIN 250 MG PO TABS
500.0000 mg | ORAL_TABLET | Freq: Once | ORAL | Status: AC
  Administered 2019-09-19: 500 mg via ORAL
  Filled 2019-09-19: qty 2

## 2019-09-19 MED ORDER — AZITHROMYCIN 250 MG PO TABS: ORAL_TABLET | ORAL | 0 refills | Status: DC

## 2019-09-19 NOTE — ED Provider Notes (Addendum)
[facility] EMERGENCY DEPARTMENT Provider Note   CSN: [id number] Arrival date & time: 09/19/19  1307     History Chief Complaint  Patient presents with  . Altered Mental Status    Pamela [name] is a 86 y.o. female.  Level 5 caveat for altered mental status.  Symptoms wax and wane.  Moments of lucidity followed by moments of confusion.  Yesterday she had trouble walking, sitting, eating.  Symptoms appear to have improved somewhat today.  Pulse ox is normally low.  She has been given Lasix in the past for lower extremity edema.  Recent Covid shot x2.  No obvious fever, chills, dysuria.  Daughter-in-law reports normal respiratory status.        Past Medical History:  Diagnosis Date  . Coronary artery disease    stent in the LAD artery in 2002  . Gait disorder 03/05/2017  . HTN (hypertension) 08/30/2012  . Hyperlipidemia 08/30/2012  . ICD (implantable cardiac defibrillator) in place   . Macular degeneration   . Pacemaker 08/29/2012   st jude accent DR RF device, model number [id number], serial number [id number], implanted 08/29/2012 for sinus bradycardia, runs of supraventricular tachycardia  last checked 10/23/2012  . Polyneuropathy in other diseases classified elsewhere (HCC)   . Shingles 08/2016  . SSS (sick sinus syndrome) with PAT and marked bradycardia with 3 sec pauses  08/30/2012  . Status post placement of cardiac pacemaker. 08/29/12 St. Jude device 08/30/2012  . Venous insufficiency     Patient Active Problem List   Diagnosis Date Noted  . Other pancytopenia (HCC) 06/26/2017  . Gait disorder 03/05/2017  . Urinary frequency 08/10/2015  . Postoperative anemia due to acute blood loss 07/27/2015  . SVT (supraventricular tachycardia) (HCC) 07/26/2015  . Femur fracture, left (HCC) 07/24/2015  . Chronic kidney disease 07/24/2015  . Periprosthetic fracture around internal prosthetic left hip joint (HCC) 07/24/2015  . Fall at home   . DVT (deep venous thrombosis) (HCC) 01/30/2014   . Occult blood positive stool 01/30/2014  . Thrombocytopenia, unspecified (HCC) 01/06/2014  . Anemia, iron deficiency 01/06/2014  . Hip fracture, left (HCC) 01/03/2014  . QT prolongation 01/03/2014  . Atherosclerotic cardiovascular disease 08/13/2013  . Hypertensive emergency 04/08/2013  . Dizzy 04/08/2013  . PAT (paroxysmal atrial tachycardia) (HCC) 12/25/2012  . SSS (sick sinus syndrome) with PAT and marked bradycardia with 3 sec pauses  08/30/2012  . Status post placement of cardiac pacemaker. 08/29/12 St. Jude device 08/30/2012  . HTN (hypertension) 08/30/2012  . Hyperlipidemia 08/30/2012  . Osteoporosis, unspecified 04/19/2012  . Essential and other specified forms of tremor 04/19/2012  . Essential hypertension 04/19/2012  . Cardiac dysrhythmia, unspecified 04/19/2012  . Chronic ischemic heart disease 04/19/2012  . Hereditary and idiopathic peripheral neuropathy 04/19/2012  . Memory loss 04/19/2012  . Disturbance of skin sensation 04/19/2012  . Pain in joint, shoulder region 11/20/2011  . Status post complete repair of rotator cuff 11/20/2011  . Muscle weakness (generalized) 11/20/2011    Past Surgical History:  Procedure Laterality Date  . ABDOMINAL HYSTERECTOMY    . basal cell  06/2017   R side of face.  Marland Kitchen CARDIAC CATHETERIZATION  2006   3 stents placed  . CARDIAC CATHETERIZATION  06/2001   placement of BiodivYsio 2.5.10mm stent dilated to 2.8mm in proximal left anterior descenting stenotic lesion  . CESAREAN SECTION    . COLONOSCOPY W/ POLYPECTOMY    . HIP ARTHROPLASTY Left 01/04/2014   Procedure: ARTHROPLASTY BIPOLAR HIP;  Surgeon: Madlyn Frankel  Alvan Dame, MD;  Location: [place];  Service: Orthopedics;  Laterality: Left;  . INSERT / REPLACE / REMOVE PACEMAKER  08/29/2012   st jude   . KIDNEY STONE SURGERY    . OPEN REDUCTION INTERNAL FIXATION (ORIF) DISTAL RADIAL FRACTURE Right 04/01/2016   Procedure: OPEN REDUCTION INTERNAL FIXATION (ORIF) RIGHT DISTAL RADIAL FRACTURE AND  REPAIR;  Surgeon: Iran Planas, MD;  Location: [place];  Service: Orthopedics;  Laterality: Right;  . PERMANENT PACEMAKER INSERTION N/A 08/29/2012   Procedure: PERMANENT PACEMAKER INSERTION;  Surgeon: Sanda Klein, MD;  Location: [place] CATH LAB;  Service: Cardiovascular;  Laterality: N/A;  . RADIOFREQUENCY ABLATION  08/25/2010   ablation of left greater saphenous vein  . ROTATOR CUFF REPAIR Left   . ROTATOR CUFF REPAIR Right   . TOTAL HIP REVISION Left 07/24/2015   Procedure: ORIF LEFT PERIPROSTHETIC FEMUR FRACTURE; CONVERSION TO TOTAL HIP ARTHROPLASTY POSS. FEMORAL COMPONENT REVISION ;  Surgeon: Rod Can, MD;  Location: [place];  Service: Orthopedics;  Laterality: Left;     OB History   No obstetric history on file.     Family History  Problem Relation Age of Onset  . Heart disease Mother   . Congestive Heart Failure Mother   . Heart disease Father   . Heart attack Father   . Heart disease Brother     Social History   Tobacco Use  . Smoking status: Never Smoker  . Smokeless tobacco: Never Used  Substance Use Topics  . Alcohol use: No    Alcohol/week: 0.0 standard drinks  . Drug use: No    Home Medications Prior to Admission medications   Medication Sig Start Date End Date Taking? Authorizing Provider  aspirin EC 81 MG tablet Take 81 mg by mouth every morning.    Yes [provider]  calcium-vitamin D (OSCAL WITH D) 500-200 MG-UNIT tablet Take 1 tablet by mouth daily.   Yes [provider]  carvedilol (COREG) 12.5 MG tablet TAKE ONE TABLET (12.5MG  TOTAL) BY MOUTH TWO TIMES DAILY Patient taking differently: Take 12.5 mg by mouth 2 (two) times daily with a meal.  03/28/19  Yes Evans Lance, MD  Cholecalciferol (VITAMIN D-3 PO) Take 2,000 Units by mouth daily.    Yes [provider]  docusate sodium (COLACE) 100 MG capsule Take 1 capsule (100 mg total) by mouth 2 (two) times daily. 04/01/16  Yes Iran Planas, MD  folic acid (FOLVITE) 1 MG tablet  Take 1 mg by mouth daily.   Yes [provider]  furosemide (LASIX) 20 MG tablet Take 20 mg by mouth daily.  09/01/19  Yes [provider]  hydrALAZINE (APRESOLINE) 25 MG tablet TAKE ONE TABLET BY MOUTH IN THE MORNING,TWO TABLETS AT NOON, AND ONE TABLET IN THE EVENING Patient taking differently: Take 25-50 mg by mouth See admin instructions. TAKE ONE TABLET BY MOUTH IN THE MORNING,TWO TABLETS AT NOON, AND ONE TABLET IN THE EVENING 07/29/19  Yes Evans Lance, MD  lidocaine (LMX) 4 % cream Apply 1 application topically 2 (two) times daily as needed (for pain).    Yes [provider]  memantine (NAMENDA) 10 MG tablet TAKE ONE TABLET (10MG  TOTAL) BY MOUTH TWO TIMES DAILY Patient taking differently: Take 10 mg by mouth 2 (two) times daily.  07/17/19  Yes Suzzanne Cloud, NP  Multiple Vitamins-Minerals (OCUVITE PO) Take 1 tablet by mouth 2 (two) times daily.   Yes [provider]  nitroGLYCERIN (NITROSTAT) 0.4 MG SL tablet Place  1 tablet (0.4 mg total) under the tongue every 5 (five) minutes as needed for chest pain. 06/01/15  Yes Marinus Maw, MD  ramipril (ALTACE) 5 MG capsule TAKE ONE CAPSULE (5MG  TOTAL) BY MOUTH DAILY Patient taking differently: Take 5 mg by mouth daily.  12/03/18  Yes 02/02/19, MD  acetaminophen (TYLENOL) 500 MG tablet Take 500 mg by mouth every 6 (six) hours as needed for mild pain or moderate pain.     [provider]  azithromycin (ZITHROMAX) 250 MG tablet 1 tablet daily for 4 more days starting Saturday evening 09/19/19   09/21/19, MD  cephALEXin (KEFLEX) 500 MG capsule Take 500 mg by mouth 4 (four) times daily. 09/03/19   [provider]  donepezil (ARICEPT) 10 MG tablet TAKE ONE TABLET BY MOUTH EVERY NIGHT AT BEDTIME Patient not taking: Reported on 09/19/2019 10/16/18   10/18/18, MD  Polyethyl Glycol-Propyl Glycol (SYSTANE OP) Apply 1 drop to eye daily as needed (Dry Eyes).     [provider]     Allergies    Gabapentin  Review of Systems   Review of Systems  Unable to perform ROS: Mental status change    Physical Exam Updated Vital Signs BP (!) 162/79   Pulse 85   Temp 98.4 F (36.9 C) (Oral)   Resp 17   Ht 5\' 5"  (1.651 m)   Wt 72.6 kg   SpO2 92%   BMI 26.63 kg/m   Physical Exam Vitals and nursing note reviewed.  Constitutional:      Appearance: She is well-developed.     Comments: Reasonably lucid.  HENT:     Head: Normocephalic and atraumatic.  Eyes:     Conjunctiva/sclera: Conjunctivae normal.  Cardiovascular:     Rate and Rhythm: Normal rate and regular rhythm.  Pulmonary:     Effort: Pulmonary effort is normal.     Breath sounds: Normal breath sounds.     Comments: Slightly tachypneic. Abdominal:     General: Bowel sounds are normal.     Palpations: Abdomen is soft.  Musculoskeletal:        General: Normal range of motion.     Cervical back: Neck supple.  Skin:    General: Skin is warm and dry.     Comments: No significant lower extremity edema.  Neurological:     General: No focal deficit present.     Mental Status: She is alert.     Comments: Knows name and where she is.  Psychiatric:        Behavior: Behavior normal.     Comments: Per daughter-in-law     ED Results / Procedures / Treatments   Labs (all labs ordered are listed, but only abnormal results are displayed) Labs Reviewed  COMPREHENSIVE METABOLIC PANEL - Abnormal; Notable for the following components:      Result Value   Sodium 130 (*)    Chloride 95 (*)    Glucose, Bld 122 (*)    Total Protein 5.9 (*)    Albumin 3.3 (*)    All other components within normal limits  CBC WITH DIFFERENTIAL/PLATELET - Abnormal; Notable for the following components:   RBC 3.63 (*)    Hemoglobin 10.5 (*)    HCT 33.0 (*)    All other components within normal limits  URINALYSIS, ROUTINE W REFLEX MICROSCOPIC  POC SARS CORONAVIRUS 2 AG -  ED    EKG EKG  Interpretation  Date/Time:  Friday September 19 2019 14:24:45 EST Ventricular Rate:  90 PR Interval:    QRS Duration: 101 QT Interval:  383 QTC Calculation: 469 R Axis:   -68 Text Interpretation: Sinus rhythm Atrial premature complex Incomplete RBBB and LAFB Confirmed by Geoffery Lyons ([id number]) on 09/19/2019 2:35:08 PM   Radiology DG Chest Port 1 View  Result Date: 09/19/2019 CLINICAL DATA:  Weakness and confusion since yesterday. EXAM: PORTABLE CHEST 1 VIEW COMPARISON:  09/26/2016 FINDINGS: Study somewhat degraded by respiratory motion and lower lung volumes. Cardiac silhouette mildly enlarged. No mediastinal or hilar masses. Left anterior chest wall sequential pacemaker is stable. Mild hazy opacity is noted in the lower lungs accentuated by low lung volumes. Stable mild scarring noted at the apices, greater on the left. Remainder of the lungs is clear. No convincing pleural effusion and no pneumothorax. Skeletal structures are demineralized. Old vertebroplasty changes in the midthoracic spine. IMPRESSION: 1. Mild hazy opacities noted in the lower lungs. Multifocal pneumonia should be considered if there are consistent clinical findings. No convincing pulmonary edema. No other evidence of acute cardiopulmonary disease. Electronically Signed   By: Amie Portland M.D.   On: 09/19/2019 15:08    Procedures Procedures (including critical care time)  Medications Ordered in ED Medications  azithromycin (ZITHROMAX) tablet 500 mg (has no administration in time range)    ED Course  I have reviewed the triage vital signs and the nursing notes.  Pertinent labs & imaging results that were available during my care of the patient were reviewed by me and considered in my medical decision making (see chart for details).  Clinical Course as of Sep 18 1806  Fri Sep 19, 2019  1557 DG Chest [place] 1 View [BC]    Clinical Course User Index [BC] Donnetta Hutching, MD   MDM Rules/Calculators/A&P                       Chief complaint altered mental status.  Portable chest may show pneumonia.  Will obtain Covid tes  1745: X-ray reviewed.  Shared decision making with patient, daughter-in-law, myself in regards to treatment options.  Will treat as an outpatient with Zithromax.  Patient is hemodynamically stable for discharge. Final Clinical Impression(s) / ED Diagnoses Final diagnoses:  Altered mental status, unspecified altered mental status type    Rx / DC Orders ED Discharge Orders         Ordered    azithromycin (ZITHROMAX) 250 MG tablet     09/19/19 1807           Donnetta Hutching, MD 09/19/19 1600    Donnetta Hutching, MD 09/19/19 [phone]

## 2019-09-19 NOTE — ED Triage Notes (Signed)
Patient began to have some confusion last night at 1700. The patient also had an abnormal gait and had to be reminded how to eat dinner. Patient was not performing ADLs at baseline. No signs if one-sided weakness or neglect at this time.

## 2019-09-19 NOTE — Discharge Instructions (Addendum)
Prescription for antibiotic sent to pharmacy.  Tylenol for pain or fever.  Increase fluids.  Follow-up with your primary care doctor.

## 2019-09-19 NOTE — ED Notes (Addendum)
Pt's daughter at bedside, states the symptoms that they called the doctor about this am had gotten better. States they have had symptoms like this multiple times in the last year and they have opted not to bring her to the ED those times because of COVID. States pt had her second covid shot on 02/17 so when the doctor suggested they bring her to the ED they agreed. States last night pt was unable to stand or walk without a lot of assistance and could not perform regular task with out help.

## 2019-09-19 NOTE — ED Notes (Signed)
Pt ambulated to the bathroom with two person assist. O2 sats when patient got back in the bed were 84% and pt had trouble breathing. After breathing in her nose and out her mouth for about 2 minutes pt's O2 sats went up to 92%.

## 2019-10-01 ENCOUNTER — Other Ambulatory Visit: Payer: Self-pay

## 2019-10-01 ENCOUNTER — Ambulatory Visit: Payer: Medicare Other | Attending: Internal Medicine

## 2019-10-01 DIAGNOSIS — Z20822 Contact with and (suspected) exposure to covid-19: Secondary | ICD-10-CM | POA: Diagnosis not present

## 2019-10-02 ENCOUNTER — Encounter: Payer: Self-pay | Admitting: Neurology

## 2019-10-02 LAB — NOVEL CORONAVIRUS, NAA: SARS-CoV-2, NAA: NOT DETECTED

## 2019-10-06 ENCOUNTER — Telehealth: Payer: Self-pay | Admitting: Neurology

## 2019-10-06 NOTE — Telephone Encounter (Signed)
I think this patient's appointment was not cancelled, prior my chart message indicates she was going to [place], going to follow up with PCP.

## 2019-10-06 NOTE — Progress Notes (Deleted)
Virtual Visit via Video Note  I connected with Pamela [name] on 10/06/19 at  1:15 PM EST by a video enabled telemedicine application and verified that I am speaking with the correct person using two identifiers.  Location: Patient: *** Provider: ***   I discussed the limitations of evaluation and management by telemedicine and the availability of in person appointments. The patient expressed understanding and agreed to proceed.  History of Present Illness: 10/06/2019 SS: Pamela [name] is a 86 year old female with history of memory disturbance and neuropathy.  She remains on Aricept and Namenda.  She was recently in the ER 09/19/2019 for altered mental status, found to have pneumonia.  10/07/2018 SS: Pamela [name] is a 86 year old female who presents for follow-up with a history of memory disturbance and neuropathy.  She is currently taking Aricept 10 mg at bedtime, Namenda 10 mg twice a day.  She currently lives alone however she has caregivers who come to help with cooking, bathing, housework.  She also has her daughter-in-law and son nearby.  She does not drive a car.  Her son manages her finances. She has help with her medications.  She does use a walker however she reports she has not had any falls.  She reports she has macular degeneration and it is hard for her to watch TV or read.  She enjoys visiting with friends, family and going out with her daughter-in-law.  Her daughter-in-law reports that has seen a change in her memory.  She reports her peripheral neuropathy has been doing well and that she does not have to use her lidocaine cream very often.  She denies any pain.  She denies any new problems or concerns.  She presents today for follow-up accompanied by her daughter-in-law.  In August 2019 her MMSE was 21/30.   Observations/Objective:   Assessment and Plan:   Follow Up Instructions:    I discussed the assessment and treatment plan with the patient. The patient was provided an  opportunity to ask questions and all were answered. The patient agreed with the plan and demonstrated an understanding of the instructions.   The patient was advised to call back or seek an in-person evaluation if the symptoms worsen or if the condition fails to improve as anticipated.  I provided *** minutes of non-face-to-face time during this encounter.   Glean Salvo, NP

## 2019-10-13 ENCOUNTER — Ambulatory Visit (INDEPENDENT_AMBULATORY_CARE_PROVIDER_SITE_OTHER): Payer: Medicare Other | Admitting: *Deleted

## 2019-10-13 DIAGNOSIS — I495 Sick sinus syndrome: Secondary | ICD-10-CM | POA: Diagnosis not present

## 2019-10-13 LAB — CUP PACEART REMOTE DEVICE CHECK
Battery Remaining Longevity: 132 mo
Battery Remaining Percentage: 95.5 %
Battery Voltage: 2.96 V
Brady Statistic AP VP Percent: 1 %
Brady Statistic AP VS Percent: 12 %
Brady Statistic AS VP Percent: 1 %
Brady Statistic AS VS Percent: 88 %
Brady Statistic RA Percent Paced: 11 %
Brady Statistic RV Percent Paced: 1 %
Date Time Interrogation Session: 20210315030015
Implantable Lead Implant Date: 20140414
Implantable Lead Implant Date: 20140414
Implantable Lead Location: 753859
Implantable Lead Location: 753860
Implantable Pulse Generator Implant Date: 20140414
Lead Channel Impedance Value: 380 Ohm
Lead Channel Impedance Value: 430 Ohm
Lead Channel Pacing Threshold Amplitude: 0.5 V
Lead Channel Pacing Threshold Amplitude: 0.75 V
Lead Channel Pacing Threshold Pulse Width: 0.4 ms
Lead Channel Pacing Threshold Pulse Width: 0.4 ms
Lead Channel Sensing Intrinsic Amplitude: 12 mV
Lead Channel Sensing Intrinsic Amplitude: 2.1 mV
Lead Channel Setting Pacing Amplitude: 1 V
Lead Channel Setting Pacing Amplitude: 1.5 V
Lead Channel Setting Pacing Pulse Width: 0.4 ms
Lead Channel Setting Sensing Sensitivity: 2 mV
Pulse Gen Model: 2210
Pulse Gen Serial Number: 7437302

## 2019-10-13 NOTE — Progress Notes (Signed)
PPM Remote  

## 2019-10-15 DIAGNOSIS — I10 Essential (primary) hypertension: Secondary | ICD-10-CM | POA: Diagnosis not present

## 2019-10-15 DIAGNOSIS — E559 Vitamin D deficiency, unspecified: Secondary | ICD-10-CM | POA: Diagnosis not present

## 2019-10-15 DIAGNOSIS — K5900 Constipation, unspecified: Secondary | ICD-10-CM | POA: Diagnosis not present

## 2019-10-15 DIAGNOSIS — F339 Major depressive disorder, recurrent, unspecified: Secondary | ICD-10-CM | POA: Diagnosis not present

## 2019-10-15 DIAGNOSIS — F0150 Vascular dementia without behavioral disturbance: Secondary | ICD-10-CM | POA: Diagnosis not present

## 2019-10-18 DIAGNOSIS — F0390 Unspecified dementia without behavioral disturbance: Secondary | ICD-10-CM | POA: Diagnosis not present

## 2019-10-18 DIAGNOSIS — Z9181 History of falling: Secondary | ICD-10-CM | POA: Diagnosis not present

## 2019-10-18 DIAGNOSIS — M6250 Muscle wasting and atrophy, not elsewhere classified, unspecified site: Secondary | ICD-10-CM | POA: Diagnosis not present

## 2019-10-18 DIAGNOSIS — Z7982 Long term (current) use of aspirin: Secondary | ICD-10-CM | POA: Diagnosis not present

## 2019-10-18 DIAGNOSIS — I2510 Atherosclerotic heart disease of native coronary artery without angina pectoris: Secondary | ICD-10-CM | POA: Diagnosis not present

## 2019-10-18 DIAGNOSIS — E785 Hyperlipidemia, unspecified: Secondary | ICD-10-CM | POA: Diagnosis not present

## 2019-10-18 DIAGNOSIS — Z950 Presence of cardiac pacemaker: Secondary | ICD-10-CM | POA: Diagnosis not present

## 2019-10-18 DIAGNOSIS — I10 Essential (primary) hypertension: Secondary | ICD-10-CM | POA: Diagnosis not present

## 2019-10-18 DIAGNOSIS — E559 Vitamin D deficiency, unspecified: Secondary | ICD-10-CM | POA: Diagnosis not present

## 2019-10-21 DIAGNOSIS — Z950 Presence of cardiac pacemaker: Secondary | ICD-10-CM | POA: Diagnosis not present

## 2019-10-21 DIAGNOSIS — E785 Hyperlipidemia, unspecified: Secondary | ICD-10-CM | POA: Diagnosis not present

## 2019-10-21 DIAGNOSIS — M6250 Muscle wasting and atrophy, not elsewhere classified, unspecified site: Secondary | ICD-10-CM | POA: Diagnosis not present

## 2019-10-21 DIAGNOSIS — E559 Vitamin D deficiency, unspecified: Secondary | ICD-10-CM | POA: Diagnosis not present

## 2019-10-21 DIAGNOSIS — Z9181 History of falling: Secondary | ICD-10-CM | POA: Diagnosis not present

## 2019-10-21 DIAGNOSIS — I2510 Atherosclerotic heart disease of native coronary artery without angina pectoris: Secondary | ICD-10-CM | POA: Diagnosis not present

## 2019-10-21 DIAGNOSIS — I10 Essential (primary) hypertension: Secondary | ICD-10-CM | POA: Diagnosis not present

## 2019-10-21 DIAGNOSIS — F0390 Unspecified dementia without behavioral disturbance: Secondary | ICD-10-CM | POA: Diagnosis not present

## 2019-10-21 DIAGNOSIS — Z7982 Long term (current) use of aspirin: Secondary | ICD-10-CM | POA: Diagnosis not present

## 2019-10-22 DIAGNOSIS — F339 Major depressive disorder, recurrent, unspecified: Secondary | ICD-10-CM | POA: Diagnosis not present

## 2019-10-22 DIAGNOSIS — F0150 Vascular dementia without behavioral disturbance: Secondary | ICD-10-CM | POA: Diagnosis not present

## 2019-10-23 DIAGNOSIS — F0390 Unspecified dementia without behavioral disturbance: Secondary | ICD-10-CM | POA: Diagnosis not present

## 2019-10-23 DIAGNOSIS — E559 Vitamin D deficiency, unspecified: Secondary | ICD-10-CM | POA: Diagnosis not present

## 2019-10-23 DIAGNOSIS — Z9181 History of falling: Secondary | ICD-10-CM | POA: Diagnosis not present

## 2019-10-23 DIAGNOSIS — Z950 Presence of cardiac pacemaker: Secondary | ICD-10-CM | POA: Diagnosis not present

## 2019-10-23 DIAGNOSIS — I2510 Atherosclerotic heart disease of native coronary artery without angina pectoris: Secondary | ICD-10-CM | POA: Diagnosis not present

## 2019-10-23 DIAGNOSIS — Z7982 Long term (current) use of aspirin: Secondary | ICD-10-CM | POA: Diagnosis not present

## 2019-10-23 DIAGNOSIS — E785 Hyperlipidemia, unspecified: Secondary | ICD-10-CM | POA: Diagnosis not present

## 2019-10-23 DIAGNOSIS — I10 Essential (primary) hypertension: Secondary | ICD-10-CM | POA: Diagnosis not present

## 2019-10-23 DIAGNOSIS — M6250 Muscle wasting and atrophy, not elsewhere classified, unspecified site: Secondary | ICD-10-CM | POA: Diagnosis not present

## 2019-10-27 DIAGNOSIS — I2510 Atherosclerotic heart disease of native coronary artery without angina pectoris: Secondary | ICD-10-CM | POA: Diagnosis not present

## 2019-10-27 DIAGNOSIS — Z7982 Long term (current) use of aspirin: Secondary | ICD-10-CM | POA: Diagnosis not present

## 2019-10-27 DIAGNOSIS — F0390 Unspecified dementia without behavioral disturbance: Secondary | ICD-10-CM | POA: Diagnosis not present

## 2019-10-27 DIAGNOSIS — E559 Vitamin D deficiency, unspecified: Secondary | ICD-10-CM | POA: Diagnosis not present

## 2019-10-27 DIAGNOSIS — Z950 Presence of cardiac pacemaker: Secondary | ICD-10-CM | POA: Diagnosis not present

## 2019-10-27 DIAGNOSIS — I10 Essential (primary) hypertension: Secondary | ICD-10-CM | POA: Diagnosis not present

## 2019-10-27 DIAGNOSIS — Z9181 History of falling: Secondary | ICD-10-CM | POA: Diagnosis not present

## 2019-10-27 DIAGNOSIS — M6250 Muscle wasting and atrophy, not elsewhere classified, unspecified site: Secondary | ICD-10-CM | POA: Diagnosis not present

## 2019-10-27 DIAGNOSIS — E785 Hyperlipidemia, unspecified: Secondary | ICD-10-CM | POA: Diagnosis not present

## 2019-10-30 DIAGNOSIS — I2510 Atherosclerotic heart disease of native coronary artery without angina pectoris: Secondary | ICD-10-CM | POA: Diagnosis not present

## 2019-10-30 DIAGNOSIS — F0390 Unspecified dementia without behavioral disturbance: Secondary | ICD-10-CM | POA: Diagnosis not present

## 2019-10-30 DIAGNOSIS — Z950 Presence of cardiac pacemaker: Secondary | ICD-10-CM | POA: Diagnosis not present

## 2019-10-30 DIAGNOSIS — E785 Hyperlipidemia, unspecified: Secondary | ICD-10-CM | POA: Diagnosis not present

## 2019-10-30 DIAGNOSIS — E559 Vitamin D deficiency, unspecified: Secondary | ICD-10-CM | POA: Diagnosis not present

## 2019-10-30 DIAGNOSIS — M6250 Muscle wasting and atrophy, not elsewhere classified, unspecified site: Secondary | ICD-10-CM | POA: Diagnosis not present

## 2019-10-30 DIAGNOSIS — Z7982 Long term (current) use of aspirin: Secondary | ICD-10-CM | POA: Diagnosis not present

## 2019-10-30 DIAGNOSIS — I10 Essential (primary) hypertension: Secondary | ICD-10-CM | POA: Diagnosis not present

## 2019-10-30 DIAGNOSIS — Z9181 History of falling: Secondary | ICD-10-CM | POA: Diagnosis not present

## 2019-11-03 DIAGNOSIS — Z950 Presence of cardiac pacemaker: Secondary | ICD-10-CM | POA: Diagnosis not present

## 2019-11-03 DIAGNOSIS — E785 Hyperlipidemia, unspecified: Secondary | ICD-10-CM | POA: Diagnosis not present

## 2019-11-03 DIAGNOSIS — M6250 Muscle wasting and atrophy, not elsewhere classified, unspecified site: Secondary | ICD-10-CM | POA: Diagnosis not present

## 2019-11-03 DIAGNOSIS — Z7982 Long term (current) use of aspirin: Secondary | ICD-10-CM | POA: Diagnosis not present

## 2019-11-03 DIAGNOSIS — Z9181 History of falling: Secondary | ICD-10-CM | POA: Diagnosis not present

## 2019-11-03 DIAGNOSIS — E559 Vitamin D deficiency, unspecified: Secondary | ICD-10-CM | POA: Diagnosis not present

## 2019-11-03 DIAGNOSIS — I2510 Atherosclerotic heart disease of native coronary artery without angina pectoris: Secondary | ICD-10-CM | POA: Diagnosis not present

## 2019-11-03 DIAGNOSIS — F0390 Unspecified dementia without behavioral disturbance: Secondary | ICD-10-CM | POA: Diagnosis not present

## 2019-11-03 DIAGNOSIS — I10 Essential (primary) hypertension: Secondary | ICD-10-CM | POA: Diagnosis not present

## 2019-11-05 DIAGNOSIS — M6250 Muscle wasting and atrophy, not elsewhere classified, unspecified site: Secondary | ICD-10-CM | POA: Diagnosis not present

## 2019-11-05 DIAGNOSIS — Z7982 Long term (current) use of aspirin: Secondary | ICD-10-CM | POA: Diagnosis not present

## 2019-11-05 DIAGNOSIS — I10 Essential (primary) hypertension: Secondary | ICD-10-CM | POA: Diagnosis not present

## 2019-11-05 DIAGNOSIS — E785 Hyperlipidemia, unspecified: Secondary | ICD-10-CM | POA: Diagnosis not present

## 2019-11-05 DIAGNOSIS — I2510 Atherosclerotic heart disease of native coronary artery without angina pectoris: Secondary | ICD-10-CM | POA: Diagnosis not present

## 2019-11-05 DIAGNOSIS — F0390 Unspecified dementia without behavioral disturbance: Secondary | ICD-10-CM | POA: Diagnosis not present

## 2019-11-05 DIAGNOSIS — Z9181 History of falling: Secondary | ICD-10-CM | POA: Diagnosis not present

## 2019-11-05 DIAGNOSIS — Z950 Presence of cardiac pacemaker: Secondary | ICD-10-CM | POA: Diagnosis not present

## 2019-11-05 DIAGNOSIS — E559 Vitamin D deficiency, unspecified: Secondary | ICD-10-CM | POA: Diagnosis not present

## 2019-11-10 DIAGNOSIS — M6250 Muscle wasting and atrophy, not elsewhere classified, unspecified site: Secondary | ICD-10-CM | POA: Diagnosis not present

## 2019-11-10 DIAGNOSIS — I10 Essential (primary) hypertension: Secondary | ICD-10-CM | POA: Diagnosis not present

## 2019-11-10 DIAGNOSIS — E785 Hyperlipidemia, unspecified: Secondary | ICD-10-CM | POA: Diagnosis not present

## 2019-11-10 DIAGNOSIS — Z9181 History of falling: Secondary | ICD-10-CM | POA: Diagnosis not present

## 2019-11-10 DIAGNOSIS — F0390 Unspecified dementia without behavioral disturbance: Secondary | ICD-10-CM | POA: Diagnosis not present

## 2019-11-10 DIAGNOSIS — I2510 Atherosclerotic heart disease of native coronary artery without angina pectoris: Secondary | ICD-10-CM | POA: Diagnosis not present

## 2019-11-10 DIAGNOSIS — Z950 Presence of cardiac pacemaker: Secondary | ICD-10-CM | POA: Diagnosis not present

## 2019-11-10 DIAGNOSIS — E559 Vitamin D deficiency, unspecified: Secondary | ICD-10-CM | POA: Diagnosis not present

## 2019-11-10 DIAGNOSIS — Z7982 Long term (current) use of aspirin: Secondary | ICD-10-CM | POA: Diagnosis not present

## 2019-11-12 ENCOUNTER — Encounter (HOSPITAL_COMMUNITY): Payer: Self-pay | Admitting: *Deleted

## 2019-11-12 ENCOUNTER — Other Ambulatory Visit: Payer: Self-pay

## 2019-11-12 ENCOUNTER — Emergency Department (HOSPITAL_COMMUNITY): Payer: Medicare Other

## 2019-11-12 ENCOUNTER — Observation Stay (HOSPITAL_COMMUNITY)
Admission: EM | Admit: 2019-11-12 | Discharge: 2019-11-14 | Disposition: A | Discharge status: Home / Self Care | Payer: Medicare Other | Attending: Family Medicine | Admitting: Family Medicine

## 2019-11-12 DIAGNOSIS — Z96642 Presence of left artificial hip joint: Secondary | ICD-10-CM | POA: Diagnosis not present

## 2019-11-12 DIAGNOSIS — I495 Sick sinus syndrome: Secondary | ICD-10-CM

## 2019-11-12 DIAGNOSIS — R791 Abnormal coagulation profile: Secondary | ICD-10-CM | POA: Insufficient documentation

## 2019-11-12 DIAGNOSIS — Z9181 History of falling: Secondary | ICD-10-CM | POA: Diagnosis not present

## 2019-11-12 DIAGNOSIS — R55 Syncope and collapse: Secondary | ICD-10-CM | POA: Diagnosis not present

## 2019-11-12 DIAGNOSIS — K5900 Constipation, unspecified: Secondary | ICD-10-CM | POA: Diagnosis not present

## 2019-11-12 DIAGNOSIS — Z7982 Long term (current) use of aspirin: Secondary | ICD-10-CM | POA: Insufficient documentation

## 2019-11-12 DIAGNOSIS — Z20822 Contact with and (suspected) exposure to covid-19: Secondary | ICD-10-CM | POA: Insufficient documentation

## 2019-11-12 DIAGNOSIS — F0390 Unspecified dementia without behavioral disturbance: Secondary | ICD-10-CM | POA: Diagnosis not present

## 2019-11-12 DIAGNOSIS — I2510 Atherosclerotic heart disease of native coronary artery without angina pectoris: Secondary | ICD-10-CM | POA: Diagnosis not present

## 2019-11-12 DIAGNOSIS — J90 Pleural effusion, not elsewhere classified: Secondary | ICD-10-CM | POA: Diagnosis not present

## 2019-11-12 DIAGNOSIS — E785 Hyperlipidemia, unspecified: Secondary | ICD-10-CM | POA: Diagnosis not present

## 2019-11-12 DIAGNOSIS — Z7189 Other specified counseling: Secondary | ICD-10-CM

## 2019-11-12 DIAGNOSIS — J939 Pneumothorax, unspecified: Secondary | ICD-10-CM

## 2019-11-12 DIAGNOSIS — I10 Essential (primary) hypertension: Secondary | ICD-10-CM | POA: Diagnosis not present

## 2019-11-12 DIAGNOSIS — Z950 Presence of cardiac pacemaker: Secondary | ICD-10-CM | POA: Insufficient documentation

## 2019-11-12 DIAGNOSIS — R270 Ataxia, unspecified: Secondary | ICD-10-CM | POA: Diagnosis not present

## 2019-11-12 DIAGNOSIS — R609 Edema, unspecified: Secondary | ICD-10-CM

## 2019-11-12 DIAGNOSIS — I499 Cardiac arrhythmia, unspecified: Secondary | ICD-10-CM | POA: Diagnosis present

## 2019-11-12 DIAGNOSIS — E559 Vitamin D deficiency, unspecified: Secondary | ICD-10-CM | POA: Diagnosis not present

## 2019-11-12 DIAGNOSIS — R0902 Hypoxemia: Secondary | ICD-10-CM | POA: Diagnosis not present

## 2019-11-12 DIAGNOSIS — F0150 Vascular dementia without behavioral disturbance: Secondary | ICD-10-CM | POA: Diagnosis not present

## 2019-11-12 DIAGNOSIS — M6250 Muscle wasting and atrophy, not elsewhere classified, unspecified site: Secondary | ICD-10-CM | POA: Diagnosis not present

## 2019-11-12 DIAGNOSIS — R918 Other nonspecific abnormal finding of lung field: Secondary | ICD-10-CM | POA: Diagnosis not present

## 2019-11-12 DIAGNOSIS — Z515 Encounter for palliative care: Secondary | ICD-10-CM

## 2019-11-12 LAB — CBC WITH DIFFERENTIAL/PLATELET
Abs Immature Granulocytes: 0.03 10*3/uL (ref 0.00–0.07)
Basophils Absolute: 0 10*3/uL (ref 0.0–0.1)
Basophils Relative: 0 %
Eosinophils Absolute: 0.1 10*3/uL (ref 0.0–0.5)
Eosinophils Relative: 2 %
HCT: 34.2 % — ABNORMAL LOW (ref 36.0–46.0)
Hemoglobin: 10.5 g/dL — ABNORMAL LOW (ref 12.0–15.0)
Immature Granulocytes: 1 %
Lymphocytes Relative: 21 %
Lymphs Abs: 1 10*3/uL (ref 0.7–4.0)
MCH: 28 pg (ref 26.0–34.0)
MCHC: 30.7 g/dL (ref 30.0–36.0)
MCV: 91.2 fL (ref 80.0–100.0)
Monocytes Absolute: 0.6 10*3/uL (ref 0.1–1.0)
Monocytes Relative: 13 %
Neutro Abs: 3 10*3/uL (ref 1.7–7.7)
Neutrophils Relative %: 63 %
Platelets: 143 10*3/uL — ABNORMAL LOW (ref 150–400)
RBC: 3.75 MIL/uL — ABNORMAL LOW (ref 3.87–5.11)
RDW: 16.3 % — ABNORMAL HIGH (ref 11.5–15.5)
WBC: 4.7 10*3/uL (ref 4.0–10.5)
nRBC: 0 % (ref 0.0–0.2)

## 2019-11-12 LAB — COMPREHENSIVE METABOLIC PANEL
ALT: 16 U/L (ref 0–44)
AST: 23 U/L (ref 15–41)
Albumin: 3.5 g/dL (ref 3.5–5.0)
Alkaline Phosphatase: 59 U/L (ref 38–126)
Anion gap: 9 (ref 5–15)
BUN: 23 mg/dL (ref 8–23)
CO2: 27 mmol/L (ref 22–32)
Calcium: 9.7 mg/dL (ref 8.9–10.3)
Chloride: 101 mmol/L (ref 98–111)
Creatinine, Ser: 1.09 mg/dL — ABNORMAL HIGH (ref 0.44–1.00)
GFR calc Af Amer: 50 mL/min — ABNORMAL LOW (ref >60)
GFR calc non Af Amer: 43 mL/min — ABNORMAL LOW (ref >60)
Glucose, Bld: 127 mg/dL — ABNORMAL HIGH (ref 70–99)
Potassium: 3.8 mmol/L (ref 3.5–5.1)
Sodium: 137 mmol/L (ref 135–145)
Total Bilirubin: 0.8 mg/dL (ref 0.3–1.2)
Total Protein: 5.8 g/dL — ABNORMAL LOW (ref 6.5–8.1)

## 2019-11-12 LAB — URINALYSIS, ROUTINE W REFLEX MICROSCOPIC
Bilirubin Urine: NEGATIVE
Glucose, UA: NEGATIVE mg/dL
Hgb urine dipstick: NEGATIVE
Ketones, ur: NEGATIVE mg/dL
Leukocytes,Ua: NEGATIVE
Nitrite: NEGATIVE
Protein, ur: NEGATIVE mg/dL
Specific Gravity, Urine: >1.046 — ABNORMAL HIGH (ref 1.005–1.030)
pH: 5 (ref 5.0–8.0)

## 2019-11-12 LAB — TROPONIN I (HIGH SENSITIVITY)
Troponin I (High Sensitivity): 22 ng/L — ABNORMAL HIGH (ref <18)
Troponin I (High Sensitivity): 20 ng/L — ABNORMAL HIGH (ref <18)

## 2019-11-12 LAB — POC SARS CORONAVIRUS 2 AG -  ED: SARS Coronavirus 2 Ag: NEGATIVE

## 2019-11-12 LAB — D-DIMER, QUANTITATIVE: D-Dimer, Quant: 6.58 ug/mL-FEU — ABNORMAL HIGH (ref 0.00–0.50)

## 2019-11-12 LAB — BRAIN NATRIURETIC PEPTIDE: B Natriuretic Peptide: 195 pg/mL — ABNORMAL HIGH (ref 0.0–100.0)

## 2019-11-12 MED ORDER — ACETAMINOPHEN 325 MG PO TABS: 650.0000 mg | ORAL_TABLET | Freq: Four times a day (QID) | ORAL | Status: DC | PRN

## 2019-11-12 MED ORDER — FOLIC ACID 1 MG PO TABS
1.0000 mg | ORAL_TABLET | Freq: Every day | ORAL | Status: DC
  Administered 2019-11-13 – 2019-11-14 (×2): 1 mg via ORAL
  Filled 2019-11-12 (×2): qty 1

## 2019-11-12 MED ORDER — HEPARIN SODIUM (PORCINE) 5000 UNIT/ML IJ SOLN
5000.0000 [IU] | Freq: Three times a day (TID) | INTRAMUSCULAR | Status: DC
  Administered 2019-11-12 – 2019-11-14 (×6): 5000 [IU] via SUBCUTANEOUS
  Filled 2019-11-12 (×6): qty 1

## 2019-11-12 MED ORDER — CARVEDILOL 12.5 MG PO TABS
12.5000 mg | ORAL_TABLET | Freq: Two times a day (BID) | ORAL | Status: DC
  Administered 2019-11-12 – 2019-11-14 (×4): 12.5 mg via ORAL
  Filled 2019-11-12 (×5): qty 1

## 2019-11-12 MED ORDER — DONEPEZIL HCL 5 MG PO TABS
10.0000 mg | ORAL_TABLET | Freq: Every day | ORAL | Status: DC
  Administered 2019-11-12 – 2019-11-13 (×2): 10 mg via ORAL
  Filled 2019-11-12 (×2): qty 2

## 2019-11-12 MED ORDER — CALCIUM CARBONATE-VITAMIN D 500-200 MG-UNIT PO TABS
1.0000 | ORAL_TABLET | Freq: Every day | ORAL | Status: DC
  Administered 2019-11-13 – 2019-11-14 (×2): 1 via ORAL
  Filled 2019-11-12 (×2): qty 1

## 2019-11-12 MED ORDER — MEMANTINE HCL 10 MG PO TABS
10.0000 mg | ORAL_TABLET | Freq: Two times a day (BID) | ORAL | Status: DC
  Administered 2019-11-12 – 2019-11-14 (×4): 10 mg via ORAL
  Filled 2019-11-12 (×4): qty 1

## 2019-11-12 MED ORDER — ACETAMINOPHEN 650 MG RE SUPP: 650.0000 mg | Freq: Four times a day (QID) | RECTAL | Status: DC | PRN

## 2019-11-12 MED ORDER — SODIUM CHLORIDE 0.9% FLUSH
3.0000 mL | Freq: Two times a day (BID) | INTRAVENOUS | Status: DC
  Administered 2019-11-12 – 2019-11-13 (×3): 3 mL via INTRAVENOUS

## 2019-11-12 MED ORDER — SODIUM CHLORIDE 0.9 % IV BOLUS
500.0000 mL | Freq: Once | INTRAVENOUS | Status: AC
  Administered 2019-11-12: 13:00:00 500 mL via INTRAVENOUS

## 2019-11-12 MED ORDER — ASPIRIN EC 81 MG PO TBEC
81.0000 mg | DELAYED_RELEASE_TABLET | Freq: Every day | ORAL | Status: DC
  Administered 2019-11-13 – 2019-11-14 (×2): 81 mg via ORAL
  Filled 2019-11-12 (×2): qty 1

## 2019-11-12 MED ORDER — IOHEXOL 350 MG/ML SOLN
100.0000 mL | Freq: Once | INTRAVENOUS | Status: AC | PRN
  Administered 2019-11-12: 16:00:00 75 mL via INTRAVENOUS

## 2019-11-12 NOTE — ED Triage Notes (Signed)
Pt brought in by [facility] from [place] with c/o syncopal episode while getting her hair done. EMS reports when they arrived pt was laying on the floor, which staff had done after the syncopal episode, and pt was awake and responding.

## 2019-11-12 NOTE — ED Notes (Signed)
[employer] rep is being notified of the need for them to come to the hospital to interrogate pacemaker.

## 2019-11-12 NOTE — ED Provider Notes (Signed)
[facility] EMERGENCY DEPARTMENT Provider Note   CSN: [id number] Arrival date & time: 11/12/19  1202  LEVEL 5 CAVEAT - DEMENTIA  History Chief Complaint  Patient presents with  . Loss of Consciousness    Pamela [name] is a 86 y.o. female.  HPI 86 year old female presents with syncope.  History is mostly from staff at [facility] who saw the events as well as the daughter-in-law at the bedside.  Patient has dementia and remembers getting her hair done today but does not remember much else.  When asked, the patient does endorse a little bit of shortness of breath but no chest pain.  Apparently she was at the beauty shop in the facility today and then passed out in the chair.  Was unresponsive for several minutes.  They laid her down in case she was still vomit but she did not fall or hit her head.  The facility notes that she has been having increased falls over the last couple weeks.  Daughter-in-law notes the patient was getting work done on her foot yesterday and also fell asleep in the chair.  She states it did not seem like she passed out but more like she fell asleep for a couple minutes.  Otherwise, no acute complaints recently such as fever, cough, etc.   Past Medical History:  Diagnosis Date  . Coronary artery disease    stent in the LAD artery in 2002  . Gait disorder 03/05/2017  . HTN (hypertension) 08/30/2012  . Hyperlipidemia 08/30/2012  . ICD (implantable cardiac defibrillator) in place   . Macular degeneration   . Pacemaker 08/29/2012   st jude accent DR RF device, model number [id number], serial number [id number], implanted 08/29/2012 for sinus bradycardia, runs of supraventricular tachycardia  last checked 10/23/2012  . Polyneuropathy in other diseases classified elsewhere (HCC)   . Shingles 08/2016  . SSS (sick sinus syndrome) with PAT and marked bradycardia with 3 sec pauses  08/30/2012  . Status post placement of cardiac pacemaker. 08/29/12 [employer] Jude device 08/30/2012  . Venous  insufficiency     Patient Active Problem List   Diagnosis Date Noted  . Other pancytopenia (HCC) 06/26/2017  . Gait disorder 03/05/2017  . Urinary frequency 08/10/2015  . Postoperative anemia due to acute blood loss 07/27/2015  . SVT (supraventricular tachycardia) (HCC) 07/26/2015  . Femur fracture, left (HCC) 07/24/2015  . Chronic kidney disease 07/24/2015  . Periprosthetic fracture around internal prosthetic left hip joint (HCC) 07/24/2015  . Fall at home   . DVT (deep venous thrombosis) (HCC) 01/30/2014  . Occult blood positive stool 01/30/2014  . Thrombocytopenia, unspecified (HCC) 01/06/2014  . Anemia, iron deficiency 01/06/2014  . Hip fracture, left (HCC) 01/03/2014  . QT prolongation 01/03/2014  . Atherosclerotic cardiovascular disease 08/13/2013  . Hypertensive emergency 04/08/2013  . Dizzy 04/08/2013  . PAT (paroxysmal atrial tachycardia) (HCC) 12/25/2012  . SSS (sick sinus syndrome) with PAT and marked bradycardia with 3 sec pauses  08/30/2012  . Status post placement of cardiac pacemaker. 08/29/12 [employer] Jude device 08/30/2012  . HTN (hypertension) 08/30/2012  . Hyperlipidemia 08/30/2012  . Osteoporosis, unspecified 04/19/2012  . Essential and other specified forms of tremor 04/19/2012  . Essential hypertension 04/19/2012  . Cardiac dysrhythmia, unspecified 04/19/2012  . Chronic ischemic heart disease 04/19/2012  . Hereditary and idiopathic peripheral neuropathy 04/19/2012  . Memory loss 04/19/2012  . Disturbance of skin sensation 04/19/2012  . Pain in joint, shoulder region 11/20/2011  . Status post complete repair of rotator  cuff 11/20/2011  . Muscle weakness (generalized) 11/20/2011    Past Surgical History:  Procedure Laterality Date  . ABDOMINAL HYSTERECTOMY    . basal cell  06/2017   R side of face.  Marland Kitchen. CARDIAC CATHETERIZATION  2006   3 stents placed  . CARDIAC CATHETERIZATION  06/2001   placement of BiodivYsio 2.5.10mm stent dilated to 2.6675mm in proximal  left anterior descenting stenotic lesion  . CESAREAN SECTION    . COLONOSCOPY W/ POLYPECTOMY    . HIP ARTHROPLASTY Left 01/04/2014   Procedure: ARTHROPLASTY BIPOLAR HIP;  Surgeon: Shelda PalMatthew D Olin, MD;  [facility];  Service: Orthopedics;  Laterality: Left;  . INSERT / REPLACE / REMOVE PACEMAKER  08/29/2012   st jude   . KIDNEY STONE SURGERY    . OPEN REDUCTION INTERNAL FIXATION (ORIF) DISTAL RADIAL FRACTURE Right 04/01/2016   Procedure: OPEN REDUCTION INTERNAL FIXATION (ORIF) RIGHT DISTAL RADIAL FRACTURE AND REPAIR;  Surgeon: Bradly BienenstockFred Ortmann, MD;  Location: [facility] OR;  Service: Orthopedics;  Laterality: Right;  . PERMANENT PACEMAKER INSERTION N/A 08/29/2012   Procedure: PERMANENT PACEMAKER INSERTION;  Surgeon: Thurmon FairMihai Croitoru, MD;  Location: [facility] CATH LAB;  Service: Cardiovascular;  Laterality: N/A;  . RADIOFREQUENCY ABLATION  08/25/2010   ablation of left greater saphenous vein  . ROTATOR CUFF REPAIR Left   . ROTATOR CUFF REPAIR Right   . TOTAL HIP REVISION Left 07/24/2015   Procedure: ORIF LEFT PERIPROSTHETIC FEMUR FRACTURE; CONVERSION TO TOTAL HIP ARTHROPLASTY POSS. FEMORAL COMPONENT REVISION ;  Surgeon: Samson FredericBrian Swinteck, MD;  [facility];  Service: Orthopedics;  Laterality: Left;     OB History   No obstetric history on file.     Family History  Problem Relation Age of Onset  . Heart disease Mother   . Congestive Heart Failure Mother   . Heart disease Father   . Heart attack Father   . Heart disease Brother     Social History   Tobacco Use  . Smoking status: Never Smoker  . Smokeless tobacco: Never Used  Substance Use Topics  . Alcohol use: No    Alcohol/week: 0.0 standard drinks  . Drug use: No    Home Medications Prior to Admission medications   Medication Sig Start Date End Date Taking? Authorizing Provider  aspirin EC 81 MG tablet Take 81 mg by mouth every morning.    Yes [provider]  calcium-vitamin D (OSCAL WITH D) 500-200 MG-UNIT tablet Take 1 tablet by  mouth daily.   Yes [provider]  carvedilol (COREG) 12.5 MG tablet TAKE ONE TABLET (12.5MG  TOTAL) BY MOUTH TWO TIMES DAILY Patient taking differently: Take 12.5 mg by mouth 2 (two) times daily with a meal.  03/28/19  Yes Marinus Mawaylor, Gregg W, MD  Cholecalciferol (VITAMIN D-3 PO) Take 2,000 Units by mouth daily.    Yes [provider]  docusate sodium (COLACE) 100 MG capsule Take 1 capsule (100 mg total) by mouth 2 (two) times daily. 04/01/16  Yes Bradly Bienenstockrtmann, Fred, MD  donepezil (ARICEPT) 10 MG tablet TAKE ONE TABLET BY MOUTH EVERY NIGHT AT BEDTIME 10/16/18  Yes York SpanielWillis, Charles K, MD  folic acid (FOLVITE) 1 MG tablet Take 1 mg by mouth daily.   Yes [provider]  hydrALAZINE (APRESOLINE) 25 MG tablet TAKE ONE TABLET BY MOUTH IN THE MORNING,TWO TABLETS AT NOON, AND ONE TABLET IN THE EVENING Patient taking differently: Take 25-50 mg by mouth See admin instructions. TAKE ONE TABLET BY MOUTH IN THE MORNING,TWO TABLETS AT [place],  AND ONE TABLET IN THE EVENING 07/29/19  Yes Marinus Maw, MD  memantine (NAMENDA) 10 MG tablet TAKE ONE TABLET (10MG  TOTAL) BY MOUTH TWO TIMES DAILY Patient taking differently: Take 10 mg by mouth 2 (two) times daily.  07/17/19  Yes 07/19/19, NP  Multiple Vitamins-Minerals (OCUVITE PO) Take 1 tablet by mouth 2 (two) times daily.   Yes [provider]  ramipril (ALTACE) 5 MG capsule TAKE ONE CAPSULE (5MG  TOTAL) BY MOUTH DAILY Patient taking differently: Take 5 mg by mouth daily.  12/03/18  Yes , MD  sertraline (ZOLOFT) 25 MG tablet Take 25 mg by mouth daily.   Yes [provider]  acetaminophen (TYLENOL) 500 MG tablet Take 500 mg by mouth every 6 (six) hours as needed for mild pain or moderate pain.     [provider]    Allergies    Gabapentin  Review of Systems   Review of Systems  Unable to perform ROS: Dementia    Physical Exam Updated Vital Signs BP 104/60   Pulse 78   Temp 98.2 F (36.8 C)  (Oral)   Resp 16   Ht 5\' 5"  (1.651 m)   Wt 68.5 kg   SpO2 94%   BMI 25.13 kg/m   Physical Exam Vitals and nursing note reviewed.  Constitutional:      General: She is not in acute distress.    Appearance: She is well-developed. She is not ill-appearing or diaphoretic.  HENT:     Head: Normocephalic and atraumatic.     Right Ear: External ear normal.     Left Ear: External ear normal.     Nose: Nose normal.     Mouth/Throat:     Comments: No obvious tongue injury Eyes:     General:        Right eye: No discharge.        Left eye: No discharge.     Extraocular Movements: Extraocular movements intact.     Pupils: Pupils are equal, round, and reactive to light.  Cardiovascular:     Rate and Rhythm: Normal rate and regular rhythm.     Heart sounds: Normal heart sounds.  Pulmonary:     Effort: Pulmonary effort is normal.     Breath sounds: Normal breath sounds.  Abdominal:     Palpations: Abdomen is soft.     Tenderness: There is no abdominal tenderness.  Skin:    General: Skin is warm and dry.  Neurological:     Mental Status: She is alert. She is disoriented.     Comments: CN 3-12 grossly intact. 5/5 strength in all 4 extremities. Grossly normal sensation.  Psychiatric:        Mood and Affect: Mood is not anxious.     ED Results / Procedures / Treatments   Labs (all labs ordered are listed, but only abnormal results are displayed) Labs Reviewed  COMPREHENSIVE METABOLIC PANEL - Abnormal; Notable for the following components:      Result Value   Glucose, Bld 127 (*)    Creatinine, Ser 1.09 (*)    Total Protein 5.8 (*)    GFR calc non Af Amer 43 (*)    GFR calc Af Amer 50 (*)    All other components within normal limits  CBC WITH DIFFERENTIAL/PLATELET - Abnormal; Notable for the following components:   RBC 3.75 (*)    Hemoglobin 10.5 (*)    HCT 34.2 (*)    RDW  16.3 (*)    Platelets 143 (*)    All other components within normal limits  D-DIMER, QUANTITATIVE  (NOT AT [facility]) - Abnormal; Notable for the following components:   D-Dimer, Quant 6.58 (*)    All other components within normal limits  TROPONIN I (HIGH SENSITIVITY) - Abnormal; Notable for the following components:   Troponin I (High Sensitivity) 22 (*)    All other components within normal limits  URINALYSIS, ROUTINE W REFLEX MICROSCOPIC  POC SARS CORONAVIRUS 2 AG -  ED  CBG MONITORING, ED  TROPONIN I (HIGH SENSITIVITY)    EKG EKG Interpretation  Date/Time:  Wednesday November 12 2019 12:07:09 EDT Ventricular Rate:  86 PR Interval:    QRS Duration: 99 QT Interval:  401 QTC Calculation: 480 R Axis:   -25 Text Interpretation: Sinus rhythm Multiple premature complexes, vent & supraven Borderline left axis deviation Borderline T wave abnormalities Confirmed by Sherwood Gambler [phone]) on 11/12/2019 12:20:30 PM   Radiology CT Head Wo Contrast  Result Date: 11/12/2019 CLINICAL DATA:  Ataxia, stroke suspected. Additional history provided: Syncopal episode. EXAM: CT HEAD WITHOUT CONTRAST TECHNIQUE: Contiguous axial images were obtained from the base of the skull through the vertex without intravenous contrast. COMPARISON:  Head CT 04/21/2017 FINDINGS: Brain: There is no evidence of acute intracranial hemorrhage, intracranial mass, midline shift or extra-axial fluid collection.No demarcated cortical infarction. Moderate patchy and confluent hypodensity within the cerebral white matter is nonspecific, but consistent with chronic small vessel ischemic disease. Findings are similar to prior head CT 04/21/2017. Stable, mild generalized parenchymal atrophy. Vascular: No hyperdense vessel.  Atherosclerotic calcifications. Skull: Normal. Negative for fracture or focal lesion. Sinuses/Orbits: Visualized orbits demonstrate no acute abnormality. Mild ethmoid sinus mucosal thickening. No significant mastoid effusion. IMPRESSION: No evidence of acute intracranial abnormality. Stable generalized parenchymal  atrophy and chronic small vessel ischemic disease as compared to head CT 04/21/2017. Mild ethmoid sinus mucosal thickening. Electronically Signed   By: Kellie Simmering DO   On: 11/12/2019 13:43   DG Chest Port 1 View  Result Date: 11/12/2019 CLINICAL DATA:  86 year old female with syncopal episode today. COVID-19 status pending. EXAM: PORTABLE CHEST 1 VIEW COMPARISON:  Portable chest 09/19/2019 and earlier. FINDINGS: Portable AP upright view at 1300 hours. Larger lung volumes but increased left lung base opacity which obscures the left hemidiaphragm today. Stable cardiomegaly and mediastinal contours. Stable left chest pacemaker. The right lung appears clear. No pneumothorax. Chronic 2 level thoracic vertebral augmentation. No acute osseous abnormality identified. IMPRESSION: 1. Larger lung volumes but new confluent left lung base opacity. Differential considerations include left pleural effusion, pneumonia, aspiration. 2. Right lung is clear.  Stable cardiomegaly. Electronically Signed   By: Genevie Ann M.D.   On: 11/12/2019 13:23    Procedures Procedures (including critical care time)  Medications Ordered in ED Medications  iohexol (OMNIPAQUE) 350 MG/ML injection 100 mL (has no administration in time range)  sodium chloride 0.9 % bolus 500 mL (500 mLs Intravenous New Bag/Given 11/12/19 1303)    ED Course  I have reviewed the triage vital signs and the nursing notes.  Pertinent labs & imaging results that were available during my care of the patient were reviewed by me and considered in my medical decision making (see chart for details).    MDM Rules/Calculators/A&P                      Patient with nonspecific syncope. Doesn't sound like seizure. Labs are reviewed, and show  mild bump in Cr, as well as nonspecific troponin of 20. Ddimer is elevated, will get CT. CXR personally reviewed, has nonspecific findings which should be clarified on CT. Pacemaker interrogation pending. Care transferred to  Dr. Deretha Emory.  Final Clinical Impression(s) / ED Diagnoses Final diagnoses:  None    Rx / DC Orders ED Discharge Orders    None       Pricilla Loveless, MD 11/12/19 1526

## 2019-11-12 NOTE — ED Notes (Signed)
Patient transported to CT 

## 2019-11-12 NOTE — ED Provider Notes (Signed)
Patient's work-up will help with a finding of a left apical pneumothorax.  It is small.  Discussed with Dr. Henreitta Leber on-call for surgery.  She recommended observation overnight.  Is not clear whether this is acute or chronic.  There is also bilateral pleural effusions oxygen levels for the most part are above 95% but occasionally get down into the low 90s.  Patient presented here following a syncopal episode.  Troponins x2 without significant change.  D-dimer was elevated CT angios how the pneumothorax and the pleural effusions were picked up.  Patient's heart monitoring and interrogation of her pacemaker showed no events around 11:00 in the morning when supposedly the syncope he asked for so it occurred.  But did show a run of some V. tach at about 4 in the morning.  We will discuss with hospitalist regarding admission Covid testing is negative.   Vanetta Mulders, MD 11/12/19 1733

## 2019-11-12 NOTE — ED Notes (Signed)
ED Provider at bedside. 

## 2019-11-12 NOTE — ED Notes (Signed)
[employer] rep called and notified RN that the report finally transmitted and that everything looked okay except for a brief run of V Tach around 0400 today. They will be faxing over a formal report soon.

## 2019-11-12 NOTE — H&P (Signed)
TRH H&P   Patient Demographics:    Pamela [name], is a 86 y.o. female  MRN: [id number]   DOB - [birth year]  Admit Date - 11/12/2019  Outpatient Primary MD for the patient is Elfredia Nevins, MD  Referring MD/NP/PA: Dr Deretha Emory  Patient coming from: [facility]  Chief Complaint  Patient presents with  . Loss of Consciousness      HPI:    Pamela [name]  is a 86 y.o. female, with a past medical history significant for tachybradycardia syndrome status post pacemaker, previous left hip replacement, hypertension, hyperlipidemia, coronary artery disease, who presented to ED secondary to syncope, still obtained from family at bedside, and ED staff, as patient having dementia, patient was at the hair salon at [facility], getting her hair done, does not remember much else, apparently patient had loss of consciousness, she was unresponsive for few minutes, she had no fall, but she was laid down to the floor by staff, she did not hurt her head or fell, overall patient had increased falls over the last couple weeks, patient is poor historian, but she denies any dyspnea, chest pain, fever, chills or cough. - in ED work-up was significant for elevated D-dimers, but CTA chest with no evidence of PE, but it was significant for the pleural effusion and new left apical pneumothorax, general surgery recommended observation-pneumothorax, she denies any dyspnea, she has no hypoxia, pacemaker was interrogated in ED, with no evidence of arrhythmias around syncope time (11 AM), but it was noted for ventricular tachycardia around 4 AM, rate hospitalist were consulted to admit.    Review of systems:     Patient is poor historian secondary to dementia, review of system was obtained with assistance of daughter alone.  Constitutional: Negative forchillsand fever.  HENT: Negative forcongestion.   Eyes:Negative. Negative for visual disturbance.  Respiratory: Negative forchest tightnessand shortness of breath.  Cardiovascular: Negative forchest pain.  Gastrointestinal: Negative forabdominal pain,nauseaand vomiting.  Genitourinary:Negative.  Musculoskeletal: Negative forarthralgias,joint swelling,neck painand neck stiffness.  Skin:Negative. Negative for rashand wound.  Neurological:  She ambulates with a walker, she sustained syncope. Psychiatric/Behavioral:Negative.    With Past History of the following :    Past Medical History:  Diagnosis Date  . Coronary artery disease    stent in the LAD artery in 2002  . Gait disorder 03/05/2017  . HTN (hypertension) 08/30/2012  . Hyperlipidemia 08/30/2012  . ICD (implantable cardiac defibrillator) in place   . Macular degeneration   . Pacemaker 08/29/2012   st jude accent DR RF device, model number [id number], serial number [id number], implanted 08/29/2012 for sinus bradycardia, runs of supraventricular tachycardia  last checked 10/23/2012  . Polyneuropathy in other diseases classified elsewhere (HCC)   . Shingles 08/2016  . SSS (sick sinus syndrome) with PAT and marked bradycardia with 3 sec pauses  08/30/2012  . Status post placement of cardiac pacemaker. 08/29/12 St. Jude device  08/30/2012  . Venous insufficiency       Past Surgical History:  Procedure Laterality Date  . ABDOMINAL HYSTERECTOMY    . basal cell  06/2017   R side of face.  Marland Kitchen CARDIAC CATHETERIZATION  2006   3 stents placed  . CARDIAC CATHETERIZATION  06/2001   placement of BiodivYsio 2.5.10mm stent dilated to 2.78mm in proximal left anterior descenting stenotic lesion  . CESAREAN SECTION    . COLONOSCOPY W/ POLYPECTOMY    . HIP ARTHROPLASTY Left 01/04/2014   Procedure: ARTHROPLASTY BIPOLAR HIP;  Surgeon: Mauri Pole, MD;  Location: [place];  Service: Orthopedics;  Laterality: Left;  . INSERT / REPLACE / REMOVE PACEMAKER  08/29/2012   st jude   .  KIDNEY STONE SURGERY    . OPEN REDUCTION INTERNAL FIXATION (ORIF) DISTAL RADIAL FRACTURE Right 04/01/2016   Procedure: OPEN REDUCTION INTERNAL FIXATION (ORIF) RIGHT DISTAL RADIAL FRACTURE AND REPAIR;  Surgeon: Iran Planas, MD;  Location: [place];  Service: Orthopedics;  Laterality: Right;  . PERMANENT PACEMAKER INSERTION N/A 08/29/2012   Procedure: PERMANENT PACEMAKER INSERTION;  Surgeon: Sanda Klein, MD;  Location: [place] CATH [facility];  Service: Cardiovascular;  Laterality: N/A;  . RADIOFREQUENCY ABLATION  08/25/2010   ablation of left greater saphenous vein  . ROTATOR CUFF REPAIR Left   . ROTATOR CUFF REPAIR Right   . TOTAL HIP REVISION Left 07/24/2015   Procedure: ORIF LEFT PERIPROSTHETIC FEMUR FRACTURE; CONVERSION TO TOTAL HIP ARTHROPLASTY POSS. FEMORAL COMPONENT REVISION ;  Surgeon: Rod Can, MD;  Location: [place];  Service: Orthopedics;  Laterality: Left;      Social History:     Social History   Tobacco Use  . Smoking status: Never Smoker  . Smokeless tobacco: Never Used  Substance Use Topics  . Alcohol use: No    Alcohol/week: 0.0 standard drinks     Lives - [facility]  Mobility - with walker     Family History :     Family History  Problem Relation Age of Onset  . Heart disease Mother   . Congestive Heart Failure Mother   . Heart disease Father   . Heart attack Father   . Heart disease Brother      Home Medications:   Prior to Admission medications   Medication Sig Start Date End Date Taking? Authorizing Provider  aspirin EC 81 MG tablet Take 81 mg by mouth every morning.    Yes [provider]  calcium-vitamin D (OSCAL WITH D) 500-200 MG-UNIT tablet Take 1 tablet by mouth daily.   Yes [provider]  carvedilol (COREG) 12.5 MG tablet TAKE ONE TABLET (12.5MG  TOTAL) BY MOUTH TWO TIMES DAILY Patient taking differently: Take 12.5 mg by mouth 2 (two) times daily with a meal.  03/28/19  Yes Evans Lance, MD  Cholecalciferol (VITAMIN D-3 PO)  Take 2,000 Units by mouth daily.    Yes [provider]  docusate sodium (COLACE) 100 MG capsule Take 1 capsule (100 mg total) by mouth 2 (two) times daily. 04/01/16  Yes Iran Planas, MD  donepezil (ARICEPT) 10 MG tablet TAKE ONE TABLET BY MOUTH EVERY NIGHT AT BEDTIME 10/16/18  Yes Kathrynn Ducking, MD  folic acid (FOLVITE) 1 MG tablet Take 1 mg by mouth daily.   Yes [provider]  hydrALAZINE (APRESOLINE) 25 MG tablet TAKE ONE TABLET BY MOUTH IN THE MORNING,TWO TABLETS AT NOON, AND ONE TABLET IN THE EVENING Patient taking differently: Take 25-50 mg by mouth See  admin instructions. TAKE ONE TABLET BY MOUTH IN THE MORNING,TWO TABLETS AT NOON, AND ONE TABLET IN THE EVENING 07/29/19  Yes Marinus Mawaylor, Gregg W, MD  memantine (NAMENDA) 10 MG tablet TAKE ONE TABLET (10MG  TOTAL) BY MOUTH TWO TIMES DAILY Patient taking differently: Take 10 mg by mouth 2 (two) times daily.  07/17/19  Yes Glean SalvoSlack, Sarah J, NP  Multiple Vitamins-Minerals (OCUVITE PO) Take 1 tablet by mouth 2 (two) times daily.   Yes [provider]  ramipril (ALTACE) 5 MG capsule TAKE ONE CAPSULE (5MG  TOTAL) BY MOUTH DAILY Patient taking differently: Take 5 mg by mouth daily.  12/03/18  Yes Marinus Mawaylor, Gregg W, MD  sertraline (ZOLOFT) 25 MG tablet Take 25 mg by mouth daily.   Yes [provider]  acetaminophen (TYLENOL) 500 MG tablet Take 500 mg by mouth every 6 (six) hours as needed for mild pain or moderate pain.     [provider]     Allergies:     Allergies  Allergen Reactions  . Gabapentin Other (See Comments)    Blurred vision, dizziness      Physical Exam:   Vitals  Blood pressure (!) 141/73, pulse 68, temperature 98.2 F (36.8 C), temperature source Oral, resp. rate (!) 23, height 5\' 5"  (1.651 m), weight 68.5 kg, SpO2 94 %.   1. General well developed female lying in bed in NAD.  2.  Patient with mild dementia, but pleasant, conversant and appropriate .  3. No F.N deficits, ALL  C.Nerves Intact, Strength 5/5 all 4 extremities, Sensation intact all 4 extremities, Plantars down going.  4. Ears and Eyes appear Normal, Conjunctivae clear, PERRLA. Moist Oral Mucosa.  5. Supple Neck, No JVD, No cervical lymphadenopathy appriciated, No Carotid Bruits.  6. Symmetrical Chest wall movement, Good air movement bilaterally, diminished air entry at the bases, right> left.  7. RRR, No Gallops, Rubs or Murmurs, No Parasternal Heave, right lower ext edema.  8. Positive Bowel Sounds, Abdomen Soft, No tenderness, No organomegaly appriciated,No rebound -guarding or rigidity.  9.  No Cyanosis, Normal Skin Turgor, No Skin Rash or Bruise.  10. Good muscle tone,  joints appear normal , no effusions, Normal ROM.  11. No Palpable Lymph Nodes in Neck or Axillae     Data Review:    CBC Recent Labs  Lab 11/12/19 1259  WBC 4.7  HGB 10.5*  HCT 34.2*  PLT 143*  MCV 91.2  MCH 28.0  MCHC 30.7  RDW 16.3*  LYMPHSABS 1.0  MONOABS 0.6  EOSABS 0.1  BASOSABS 0.0   ------------------------------------------------------------------------------------------------------------------  Chemistries  Recent Labs  Lab 11/12/19 1259  NA 137  K 3.8  CL 101  CO2 27  GLUCOSE 127*  BUN 23  CREATININE 1.09*  CALCIUM 9.7  AST 23  ALT 16  ALKPHOS 59  BILITOT 0.8   ------------------------------------------------------------------------------------------------------------------ estimated creatinine clearance is 29.4 mL/min (A) (by C-G formula based on SCr of 1.09 mg/dL (H)). ------------------------------------------------------------------------------------------------------------------ No results for input(s): TSH, T4TOTAL, T3FREE, THYROIDAB in the last 72 hours.  Invalid input(s): FREET3  Coagulation profile No results for input(s): INR, PROTIME in the last 168  hours. ------------------------------------------------------------------------------------------------------------------- Recent Labs    11/12/19 1259  DDIMER 6.58*   -------------------------------------------------------------------------------------------------------------------  Cardiac Enzymes No results for input(s): CKMB, TROPONINI, MYOGLOBIN in the last 168 hours.  Invalid input(s): CK ------------------------------------------------------------------------------------------------------------------ No results found for: BNP   ---------------------------------------------------------------------------------------------------------------  Urinalysis    Component Value Date/Time   COLORURINE STRAW (A) 09/19/2019 1715   APPEARANCEUR CLEAR 09/19/2019  1715   LABSPEC 1.004 (L) 09/19/2019 1715   PHURINE 7.0 09/19/2019 1715   GLUCOSEU NEGATIVE 09/19/2019 1715   HGBUR NEGATIVE 09/19/2019 1715   BILIRUBINUR NEGATIVE 09/19/2019 1715   KETONESUR NEGATIVE 09/19/2019 1715   PROTEINUR NEGATIVE 09/19/2019 1715   UROBILINOGEN 0.2 02/10/2015 1104   NITRITE NEGATIVE 09/19/2019 1715   LEUKOCYTESUR TRACE (A) 09/19/2019 1715    ----------------------------------------------------------------------------------------------------------------   Imaging Results:    CT Head Wo Contrast  Result Date: 11/12/2019 CLINICAL DATA:  Ataxia, stroke suspected. Additional history provided: Syncopal episode. EXAM: CT HEAD WITHOUT CONTRAST TECHNIQUE: Contiguous axial images were obtained from the base of the skull through the vertex without intravenous contrast. COMPARISON:  Head CT 04/21/2017 FINDINGS: Brain: There is no evidence of acute intracranial hemorrhage, intracranial mass, midline shift or extra-axial fluid collection.No demarcated cortical infarction. Moderate patchy and confluent hypodensity within the cerebral white matter is nonspecific, but consistent with chronic small vessel ischemic  disease. Findings are similar to prior head CT 04/21/2017. Stable, mild generalized parenchymal atrophy. Vascular: No hyperdense vessel.  Atherosclerotic calcifications. Skull: Normal. Negative for fracture or focal lesion. Sinuses/Orbits: Visualized orbits demonstrate no acute abnormality. Mild ethmoid sinus mucosal thickening. No significant mastoid effusion. IMPRESSION: No evidence of acute intracranial abnormality. Stable generalized parenchymal atrophy and chronic small vessel ischemic disease as compared to head CT 04/21/2017. Mild ethmoid sinus mucosal thickening. Electronically Signed   By: Jackey Loge DO   On: 11/12/2019 13:43   CT Angio Chest PE W and/or Wo Contrast  Result Date: 11/12/2019 CLINICAL DATA:  Syncopal episodes EXAM: CT ANGIOGRAPHY CHEST WITH CONTRAST TECHNIQUE: Multidetector CT imaging of the chest was performed using the standard protocol during bolus administration of intravenous contrast. Multiplanar CT image reconstructions and MIPs were obtained to evaluate the vascular anatomy. CONTRAST:  63mL OMNIPAQUE IOHEXOL 350 MG/ML SOLN COMPARISON:  Radiograph same day, CT September 26, 2016 FINDINGS: Cardiovascular: There is a optimal opacification of the pulmonary arteries. There is no central,segmental, or subsegmental filling defects within the pulmonary arteries. There is mild cardiomegaly. No pericardial effusion or thickening. No evidence right heart strain. There is normal three-vessel brachiocephalic anatomy without proximal stenosis. The thoracic aorta is normal in appearance. Scattered aortic atherosclerosis is noted. Mediastinum/Nodes: No hilar, mediastinal, or axillary adenopathy. Thyroid gland, trachea, and esophagus demonstrate no significant findings. Lungs/Pleura: A small left apical pneumothorax is seen. Small bilateral pleural effusions are present, left greater than right. There is mild interlobular septal thickening seen at both lung bases. Patchy atelectasis seen adjacent  to the pleural effusions. There is scarring with small calcifications seen at the left lung apex. Bleb formation seen within the anterior left upper lobe. Upper Abdomen: No acute abnormalities present in the visualized portions of the upper abdomen. Musculoskeletal: New since the prior exam is advanced compression deformity of the T6 vertebral body with 80% loss in vertebral body height. There is also slight superior compression deformity of the T7 vertebral body with less than 25% loss in height. Inferior endplate compression deformity of the T11 vertebral body is noted with air between the disc space and less than 25% loss in height. There is been prior cement fixation of the T8 and T9 vertebral bodies. Review of the MIP images confirms the above findings. IMPRESSION: 1. Small left apical pneumothorax. 2. No central, segmental, or subsegmental pulmonary embolism. 3. Age indeterminate, however new since prior exam, compression deformities of the T6, T7, and T11 vertebral bodies as described above. No retropulsion of fragments. 4. Small bilateral pleural  effusions, left greater than right with adjacent basilar atelectasis. 5. These results were called by telephone at the time of interpretation on 11/12/2019 at 4:22 pm to provider Dr. Deretha Emory, who verbally acknowledged these results. Electronically Signed   By: Jonna Clark M.D.   On: 11/12/2019 16:28   DG Chest Port 1 View  Result Date: 11/12/2019 CLINICAL DATA:  86 year old female with syncopal episode today. COVID-19 status pending. EXAM: PORTABLE CHEST 1 VIEW COMPARISON:  Portable chest 09/19/2019 and earlier. FINDINGS: Portable AP upright view at 1300 hours. Larger lung volumes but increased left lung base opacity which obscures the left hemidiaphragm today. Stable cardiomegaly and mediastinal contours. Stable left chest pacemaker. The right lung appears clear. No pneumothorax. Chronic 2 level thoracic vertebral augmentation. No acute osseous abnormality  identified. IMPRESSION: 1. Larger lung volumes but new confluent left lung base opacity. Differential considerations include left pleural effusion, pneumonia, aspiration. 2. Right lung is clear.  Stable cardiomegaly. Electronically Signed   By: Odessa Fleming M.D.   On: 11/12/2019 13:23    My personal review of EKG: Rhythm NSR, Rate  86 /min, QTc 480.   Assessment & Plan:    Active Problems:   SSS (sick sinus syndrome) with PAT and marked bradycardia with 3 sec pauses    Essential hypertension   Cardiac dysrhythmia   Syncope   Syncope -So far no clear etiology. -Pacemaker interrogated in ED, no arrhythmias during the syncope time(11 am), even though she had evidence of ventricular tachycardia at 4 AM. -She denies any chest pain, EKG nonacute, troponins negative x2. -Follow on urine analysis. -Check orthostasis. -We will check 2D echo visually with evidence of mild left pleural effusion and ventricular tachycardia at 4 AM per  pacemaker interrogation0. -Pneumothorax is very small, possibly chronic, likely contributing to this event. -We will consult PT. -CTA chest with no evidence of PE, was obtained given elevated D-dimers.  Elevated D-dimers -CTA chest negative for PE, will obtain venous Doppler given right lower extremity edema.  Pneumothorax -Likely incidental finding, likely it is chronic, small, ED discussed with general surgery, recommendation for overnight observation and repeat imaging in a.m. -General surgery consulted.  History of sick sinus syndrome -Status post maker, interrogated in ED.  Hypertension -Resume Coreg, and hold hydralazine and ACE inhibitor.  Dementia -Continue with home medication.    DVT Prophylaxis Heparin .  AM Labs Ordered, also please review Full Orders  Family Communication: Admission, patients condition and plan of care including tests being ordered have been discussed with the patient and daughter-in-law who indicate understanding and agree  with the plan and Code Status.  Code Status full  Likely DC to [facility]  Condition GUARDED    Consults called: ED D/W Sx  Admission status: Observation  Time spent in minutes : 60 minutes   Huey Bienenstock M.D on 11/12/2019 at 6:13 PM  Between 7am to 7pm - Pager - [phone]. After 7pm go to [email or web address] - password [facility]  [facility] - Office  [phone]

## 2019-11-12 NOTE — ED Notes (Signed)
Interrogation of pacemaker has been attempted several times with no success in the transmission. RN contacting [employer]. Jude rep at this time for troubleshooting. Dr. Criss Alvine notified.

## 2019-11-13 ENCOUNTER — Observation Stay (HOSPITAL_COMMUNITY): Payer: Medicare Other

## 2019-11-13 ENCOUNTER — Observation Stay (HOSPITAL_BASED_OUTPATIENT_CLINIC_OR_DEPARTMENT_OTHER): Payer: Medicare Other

## 2019-11-13 DIAGNOSIS — Z515 Encounter for palliative care: Secondary | ICD-10-CM

## 2019-11-13 DIAGNOSIS — Z7189 Other specified counseling: Secondary | ICD-10-CM

## 2019-11-13 DIAGNOSIS — J90 Pleural effusion, not elsewhere classified: Secondary | ICD-10-CM | POA: Diagnosis not present

## 2019-11-13 DIAGNOSIS — F0390 Unspecified dementia without behavioral disturbance: Secondary | ICD-10-CM | POA: Diagnosis not present

## 2019-11-13 DIAGNOSIS — I351 Nonrheumatic aortic (valve) insufficiency: Secondary | ICD-10-CM | POA: Diagnosis not present

## 2019-11-13 DIAGNOSIS — M7989 Other specified soft tissue disorders: Secondary | ICD-10-CM | POA: Diagnosis not present

## 2019-11-13 DIAGNOSIS — I495 Sick sinus syndrome: Secondary | ICD-10-CM | POA: Diagnosis not present

## 2019-11-13 DIAGNOSIS — R55 Syncope and collapse: Secondary | ICD-10-CM | POA: Diagnosis not present

## 2019-11-13 DIAGNOSIS — J939 Pneumothorax, unspecified: Secondary | ICD-10-CM

## 2019-11-13 DIAGNOSIS — I10 Essential (primary) hypertension: Secondary | ICD-10-CM | POA: Diagnosis not present

## 2019-11-13 LAB — CBC
HCT: 33.8 % — ABNORMAL LOW (ref 36.0–46.0)
Hemoglobin: 10.3 g/dL — ABNORMAL LOW (ref 12.0–15.0)
MCH: 28.2 pg (ref 26.0–34.0)
MCHC: 30.5 g/dL (ref 30.0–36.0)
MCV: 92.6 fL (ref 80.0–100.0)
Platelets: 153 10*3/uL (ref 150–400)
RBC: 3.65 MIL/uL — ABNORMAL LOW (ref 3.87–5.11)
RDW: 16.3 % — ABNORMAL HIGH (ref 11.5–15.5)
WBC: 4.1 10*3/uL (ref 4.0–10.5)
nRBC: 0 % (ref 0.0–0.2)

## 2019-11-13 LAB — GLUCOSE, CAPILLARY
Glucose-Capillary: 86 mg/dL (ref 70–99)
Glucose-Capillary: 86 mg/dL (ref 70–99)
Glucose-Capillary: 110 mg/dL — ABNORMAL HIGH (ref 70–99)
Glucose-Capillary: 92 mg/dL (ref 70–99)
Glucose-Capillary: 136 mg/dL — ABNORMAL HIGH (ref 70–99)

## 2019-11-13 LAB — MAGNESIUM: Magnesium: 1.8 mg/dL (ref 1.7–2.4)

## 2019-11-13 LAB — BASIC METABOLIC PANEL
Anion gap: 9 (ref 5–15)
BUN: 25 mg/dL — ABNORMAL HIGH (ref 8–23)
CO2: 27 mmol/L (ref 22–32)
Calcium: 9.1 mg/dL (ref 8.9–10.3)
Chloride: 102 mmol/L (ref 98–111)
Creatinine, Ser: 1.03 mg/dL — ABNORMAL HIGH (ref 0.44–1.00)
GFR calc Af Amer: 53 mL/min — ABNORMAL LOW (ref >60)
GFR calc non Af Amer: 46 mL/min — ABNORMAL LOW (ref >60)
Glucose, Bld: 94 mg/dL (ref 70–99)
Potassium: 3.8 mmol/L (ref 3.5–5.1)
Sodium: 138 mmol/L (ref 135–145)

## 2019-11-13 LAB — ECHOCARDIOGRAM COMPLETE
Height: 65 in
Weight: 2419.77 oz

## 2019-11-13 MED ORDER — HYDRALAZINE HCL 25 MG PO TABS
50.0000 mg | ORAL_TABLET | Freq: Every day | ORAL | Status: DC
  Administered 2019-11-13 – 2019-11-14 (×2): 50 mg via ORAL
  Filled 2019-11-13 (×3): qty 2

## 2019-11-13 MED ORDER — HYDRALAZINE HCL 25 MG PO TABS
25.0000 mg | ORAL_TABLET | Freq: Two times a day (BID) | ORAL | Status: DC
  Administered 2019-11-13 – 2019-11-14 (×3): 25 mg via ORAL
  Filled 2019-11-13 (×3): qty 1

## 2019-11-13 MED ORDER — SERTRALINE HCL 50 MG PO TABS
25.0000 mg | ORAL_TABLET | Freq: Every day | ORAL | Status: DC
  Administered 2019-11-13 – 2019-11-14 (×2): 25 mg via ORAL
  Filled 2019-11-13 (×2): qty 1

## 2019-11-13 MED ORDER — DOCUSATE SODIUM 100 MG PO CAPS: 100.0000 mg | ORAL_CAPSULE | Freq: Two times a day (BID) | ORAL | Status: DC

## 2019-11-13 NOTE — Evaluation (Signed)
Physical Therapy Evaluation Patient Details Name: Pamela [name] MRN: [id number] DOB: 05/09/1921 Today's Date: 11/13/2019   History of Present Illness  Pamela [name]  is a 86 y.o. female, with a past medical history significant for tachybradycardia syndrome status post pacemaker, previous left hip replacement, hypertension, hyperlipidemia, coronary artery disease, who presented to ED secondary to syncope, still obtained from family at bedside, and ED staff, as patient having dementia, patient was at the hair salon at [place], getting her hair done, does not remember much else, apparently patient had loss of consciousness, she was unresponsive for few minutes, she had no fall, but she was laid down to the floor by staff, she did not hurt her head or fell, overall patient had increased falls over the last couple weeks, patient is poor historian, but she denies any dyspnea, chest pain, fever, chills or cough.    Clinical Impression  Patient functioning near baseline for functional mobility and gait, had trouble using BUE for bed mobility due to weakness, able to ambulate to doorway and back to bedside without loss of balance with slow labored movement and tolerated sitting up in chair after therapy - RN notified.  Patient will benefit from continued physical therapy in hospital and recommended venue below to increase strength, balance, endurance for safe ADLs and gait.     Follow Up Recommendations Home health PT;Supervision for mobility/OOB;Supervision - Intermittent    Equipment Recommendations  None recommended by PT    Recommendations for Other Services       Precautions / Restrictions Precautions Precautions: Fall Restrictions Weight Bearing Restrictions: No      Mobility  Bed Mobility Overal bed mobility: Needs Assistance Bed Mobility: Supine to Sit     Supine to sit: Min assist     General bed mobility comments: slow labored movement diffiuclty using BUE secondary to  weakness  Transfers Overall transfer level: Needs assistance Equipment used: Rolling walker (2 wheeled) Transfers: Sit to/from [employer] Sit to Stand: Min guard Stand pivot transfers: Min guard;Min assist       General transfer comment: increased time, labored movement  Ambulation/Gait Ambulation/Gait assistance: Min assist;Mod assist Gait Distance (Feet): 22 Feet Assistive device: Rolling walker (2 wheeled) Gait Pattern/deviations: Decreased step length - right;Decreased step length - left;Decreased stride length;Wide base of support Gait velocity: deceassed   General Gait Details: slow labored movement with wide base of support, short steps, no loss of balance using RW, limited secondary to c/o fatigue  Stairs            Wheelchair Mobility    Modified Rankin (Stroke Patients Only)       Balance Overall balance assessment: Needs assistance Sitting-balance support: Feet supported;No upper extremity supported Sitting balance-Leahy Scale: Fair Sitting balance - Comments: fair/good seated at EOB   Standing balance support: During functional activity;Bilateral upper extremity supported Standing balance-Leahy Scale: Fair Standing balance comment: using RW                             Pertinent Vitals/Pain Pain Assessment: No/denies pain    Home Living Family/patient expects to be discharged to:: Assisted living               Home Equipment: Walker - 2 wheels;Wheelchair - manual      Prior Function Level of Independence: Needs assistance   Gait / Transfers Assistance Needed: Assisted transfers and short household distanced ambulation using RW  ADL's / Homemaking  Assistance Needed: assisted by ALF staff        Hand Dominance   Dominant Hand: Right    Extremity/Trunk Assessment   Upper Extremity Assessment Upper Extremity Assessment: Generalized weakness    Lower Extremity Assessment Lower Extremity Assessment:  Generalized weakness    Cervical / Trunk Assessment Cervical / Trunk Assessment: Kyphotic  Communication   Communication: No difficulties  Cognition Arousal/Alertness: Awake/alert Behavior During Therapy: WFL for tasks assessed/performed Overall Cognitive Status: Within Functional Limits for tasks assessed                                        General Comments      Exercises     Assessment/Plan    PT Assessment Patient needs continued PT services  PT Problem List Decreased strength;Decreased activity tolerance;Decreased balance;Decreased mobility       PT Treatment Interventions Gait training;Functional mobility training;Therapeutic activities;Therapeutic exercise;Patient/family education;Stair training    PT Goals (Current goals can be found in the Care Plan section)  Acute Rehab PT Goals Patient Stated Goal: return home with ALF staff to assist PT Goal Formulation: With patient Time For Goal Achievement: 11/17/19 Potential to Achieve Goals: Good    Frequency Min 3X/week   Barriers to discharge        Co-evaluation               AM-PAC PT "6 Clicks" Mobility  Outcome Measure Help needed turning from your back to your side while in a flat bed without using bedrails?: A Little Help needed moving from lying on your back to sitting on the side of a flat bed without using bedrails?: A Lot Help needed moving to and from a bed to a chair (including a wheelchair)?: A Little Help needed standing up from a chair using your arms (e.g., wheelchair or bedside chair)?: A Little Help needed to walk in hospital room?: A Lot Help needed climbing 3-5 steps with a railing? : A Lot 6 Click Score: 15    End of Session   Activity Tolerance: Patient tolerated treatment well;Patient limited by fatigue Patient left: in chair;with call bell/phone within reach Nurse Communication: Mobility status PT Visit Diagnosis: Unsteadiness on feet (R26.81);Other  abnormalities of gait and mobility (R26.89);Muscle weakness (generalized) (M62.81)    Time: 0981-1914 PT Time Calculation (min) (ACUTE ONLY): 23 min   Charges:   PT Evaluation $PT Eval Moderate Complexity: 1 Mod PT Treatments $Therapeutic Activity: 23-37 mins        2:01 PM, 11/13/19 Lonell Grandchild, MPT Physical Therapist with [facility] [phone] office [phone] mobile phone

## 2019-11-13 NOTE — Plan of Care (Signed)
  Problem: Acute Rehab PT Goals(only PT should resolve) Goal: Pt Will Go Supine/Side To Sit Outcome: Progressing Flowsheets (Taken 11/13/2019 1402) Pt will go Supine/Side to Sit: with min guard assist Goal: Patient Will Transfer Sit To/From Stand Outcome: Progressing Flowsheets (Taken 11/13/2019 1402) Patient will transfer sit to/from stand: with min guard assist Goal: Pt Will Transfer Bed To Chair/Chair To Bed Outcome: Progressing Flowsheets (Taken 11/13/2019 1402) Pt will Transfer Bed to Chair/Chair to Bed:  with min assist  min guard assist Goal: Pt Will Ambulate Outcome: Progressing Flowsheets (Taken 11/13/2019 1402) Pt will Ambulate:  25 feet  with min guard assist  with minimal assist  with rolling walker   2:03 PM, 11/13/19 Ocie Bob, MPT Physical Therapist with [facility] [phone] office [phone] mobile phone

## 2019-11-13 NOTE — Care Management CC44 (Signed)
Condition Code 44 Documentation Completed  Patient Details  Name: ALLAHNA HUSBAND MRN: [id number] Date of Birth: [birth year]   Condition Code 44 given:  Yes Patient signature on Condition Code 44 notice:  Yes Documentation of 2 MD's agreement:  Yes Code 44 added to claim:  Yes    Leitha Bleak, RN 11/13/2019, 4:30 PM

## 2019-11-13 NOTE — Progress Notes (Signed)
PROGRESS NOTE [facility]   Pamela [name]  EHM:[id number]  DOB: 06/23/1921  DOA: 11/12/2019 PCP: Elfredia Nevins, MD   Brief Admission Hx: 86 y.o. female, with a past medical history significant for tachybradycardia syndrome status post pacemaker, previous left hip replacement, hypertension, hyperlipidemia, coronary artery disease, who presented to ED secondary to syncope  MDM/Assessment & Plan:   1. Syncope - unprovoked, no clear etiology found, pacemaker was interrogated and no specific arrhythmia determined at time of syncopal episode, no recurrences. Echo with no significant findings.  PT eval pending.  2. Pneumothorax - likely chronic, stable at this time, discussed with surgery will repeat CXR in AM to follow.  STAT CXR for any acute symptoms of dyspnea, etc.  3. Elevated D dimer - CTA chest neg for PE, venous dopplers with findings of chronic nonocclusive DVT of left distal femoral vein.  4. Dementia - resumed home meds.  Palliative medicine consult for GOC.   5. HTN - resumed all home meds.   DVT prophylaxis: subcut heparin  Code Status: Full  Family Communication: unable to reach  Disposition Plan: recheck CXR in AM to follow up pneumothorax   Consultants:  Surgery   Procedures:    Antimicrobials:     Subjective: Pt without any specific complaints.   Objective: Vitals:   11/12/19 2017 11/13/19 0400 11/13/19 0500 11/13/19 0759  BP: (!) 147/65 (!) 145/64  (!) 147/61  Pulse: 76   79  Resp: 18   18  Temp: (!) 97.4 F (36.3 C) 98.2 F (36.8 C)  98.6 F (37 C)  TempSrc:  Oral  Oral  SpO2: 96%   95%  Weight:   68.6 kg   Height:        Intake/Output Summary (Last 24 hours) at 11/13/2019 1316 Last data filed at 11/13/2019 0900 Gross per 24 hour  Intake 620 ml  Output 250 ml  Net 370 ml   Filed Weights   11/12/19 1208 11/13/19 0500  Weight: 68.5 kg 68.6 kg   REVIEW OF SYSTEMS  As per history otherwise all reviewed and reported  negative  Exam:  General exam: elderly female sitting up in bed, awake, alert, NAD, some confusion.  Respiratory system: shallow BS bilateral. No increased work of breathing. Cardiovascular system: S1 & S2 heard. No JVD, murmurs, gallops, clicks or pedal edema. Gastrointestinal system: Abdomen is nondistended, soft and nontender. Normal bowel sounds heard. Central nervous system: Alert and oriented. No focal neurological deficits. Extremities: edema BLEs.  Data Reviewed: Basic Metabolic Panel: Recent Labs  Lab 11/12/19 1259 11/13/19 0505  NA 137 138  K 3.8 3.8  CL 101 102  CO2 27 27  GLUCOSE 127* 94  BUN 23 25*  CREATININE 1.09* 1.03*  CALCIUM 9.7 9.1  MG  --  1.8   Liver Function Tests: Recent Labs  Lab 11/12/19 1259  AST 23  ALT 16  ALKPHOS 59  BILITOT 0.8  PROT 5.8*  ALBUMIN 3.5   No results for input(s): LIPASE, AMYLASE in the last 168 hours. No results for input(s): AMMONIA in the last 168 hours. CBC: Recent Labs  Lab 11/12/19 1259 11/13/19 0505  WBC 4.7 4.1  NEUTROABS 3.0  --   HGB 10.5* 10.3*  HCT 34.2* 33.8*  MCV 91.2 92.6  PLT 143* 153   Cardiac Enzymes: No results for input(s): CKTOTAL, CKMB, CKMBINDEX, TROPONINI in the last 168 hours. CBG (last 3)  Recent Labs    11/13/19 0504 11/13/19 0718 11/13/19 1121  GLUCAP 86 86 110*   No results found for this or any previous visit (from the past 240 hour(s)).   Studies: CT Head Wo Contrast  Result Date: 11/12/2019 CLINICAL DATA:  Ataxia, stroke suspected. Additional history provided: Syncopal episode. EXAM: CT HEAD WITHOUT CONTRAST TECHNIQUE: Contiguous axial images were obtained from the base of the skull through the vertex without intravenous contrast. COMPARISON:  Head CT 04/21/2017 FINDINGS: Brain: There is no evidence of acute intracranial hemorrhage, intracranial mass, midline shift or extra-axial fluid collection.No demarcated cortical infarction. Moderate patchy and confluent hypodensity  within the cerebral white matter is nonspecific, but consistent with chronic small vessel ischemic disease. Findings are similar to prior head CT 04/21/2017. Stable, mild generalized parenchymal atrophy. Vascular: No hyperdense vessel.  Atherosclerotic calcifications. Skull: Normal. Negative for fracture or focal lesion. Sinuses/Orbits: Visualized orbits demonstrate no acute abnormality. Mild ethmoid sinus mucosal thickening. No significant mastoid effusion. IMPRESSION: No evidence of acute intracranial abnormality. Stable generalized parenchymal atrophy and chronic small vessel ischemic disease as compared to head CT 04/21/2017. Mild ethmoid sinus mucosal thickening. Electronically Signed   By: Kellie Simmering DO   On: 11/12/2019 13:43   CT Angio Chest PE W and/or Wo Contrast  Result Date: 11/12/2019 CLINICAL DATA:  Syncopal episodes EXAM: CT ANGIOGRAPHY CHEST WITH CONTRAST TECHNIQUE: Multidetector CT imaging of the chest was performed using the standard protocol during bolus administration of intravenous contrast. Multiplanar CT image reconstructions and MIPs were obtained to evaluate the vascular anatomy. CONTRAST:  77mL OMNIPAQUE IOHEXOL 350 MG/ML SOLN COMPARISON:  Radiograph same day, CT September 26, 2016 FINDINGS: Cardiovascular: There is a optimal opacification of the pulmonary arteries. There is no central,segmental, or subsegmental filling defects within the pulmonary arteries. There is mild cardiomegaly. No pericardial effusion or thickening. No evidence right heart strain. There is normal three-vessel brachiocephalic anatomy without proximal stenosis. The thoracic aorta is normal in appearance. Scattered aortic atherosclerosis is noted. Mediastinum/Nodes: No hilar, mediastinal, or axillary adenopathy. Thyroid gland, trachea, and esophagus demonstrate no significant findings. Lungs/Pleura: A small left apical pneumothorax is seen. Small bilateral pleural effusions are present, left greater than right.  There is mild interlobular septal thickening seen at both lung bases. Patchy atelectasis seen adjacent to the pleural effusions. There is scarring with small calcifications seen at the left lung apex. Bleb formation seen within the anterior left upper lobe. Upper Abdomen: No acute abnormalities present in the visualized portions of the upper abdomen. Musculoskeletal: New since the prior exam is advanced compression deformity of the T6 vertebral body with 80% loss in vertebral body height. There is also slight superior compression deformity of the T7 vertebral body with less than 25% loss in height. Inferior endplate compression deformity of the T11 vertebral body is noted with air between the disc space and less than 25% loss in height. There is been prior cement fixation of the T8 and T9 vertebral bodies. Review of the MIP images confirms the above findings. IMPRESSION: 1. Small left apical pneumothorax. 2. No central, segmental, or subsegmental pulmonary embolism. 3. Age indeterminate, however new since prior exam, compression deformities of the T6, T7, and T11 vertebral bodies as described above. No retropulsion of fragments. 4. Small bilateral pleural effusions, left greater than right with adjacent basilar atelectasis. 5. These results were called by telephone at the time of interpretation on 11/12/2019 at 4:22 pm to provider Dr. Rogene Houston, who verbally acknowledged these results. Electronically Signed   By: Prudencio Pair M.D.   On: 11/12/2019 16:28  US Venous Img Lower Bilateral (DVT)  Result Date: 11/13/2019 CLINICAL DATA:  86 year old female with a history of right leg swelling EXAM: BILATERAL LOWER EXTREMITY VENOUS DOPPLER ULTRASOUND TECHNIQUE: Gray-scale sonography with graded compression, as well as color Doppler and duplex ultrasound were performed to evaluate the lower extremity deep venous systems from the level of the common femoral vein and including the common femoral, femoral, profunda femoral,  popliteal and calf veins including the posterior tibial, peroneal and gastrocnemius veins when visible. The superficial great saphenous vein was also interrogated. Spectral Doppler was utilized to evaluate flow at rest and with distal augmentation maneuvers in the common femoral, femoral and popliteal veins. COMPARISON:  None. FINDINGS: RIGHT LOWER EXTREMITY Common Femoral Vein: No evidence of thrombus. Normal compressibility, respiratory phasicity and response to augmentation. Saphenofemoral Junction: No evidence of thrombus. Normal compressibility and flow on color Doppler imaging. Profunda Femoral Vein: No evidence of thrombus. Normal compressibility and flow on color Doppler imaging. Femoral Vein: No evidence of thrombus. Normal compressibility, respiratory phasicity and response to augmentation. Popliteal Vein: No evidence of thrombus. Normal compressibility, respiratory phasicity and response to augmentation. Calf Veins: No evidence of thrombus. Normal compressibility and flow on color Doppler imaging. Superficial Great Saphenous Vein: No evidence of thrombus. Normal compressibility and flow on color Doppler imaging. Other Findings:  None. LEFT LOWER EXTREMITY Common Femoral Vein: No evidence of thrombus. Normal compressibility, respiratory phasicity and response to augmentation. Saphenofemoral Junction: No evidence of thrombus. Normal compressibility and flow on color Doppler imaging. Profunda Femoral Vein: No evidence of thrombus. Normal compressibility and flow on color Doppler imaging. Femoral Vein: Incomplete compressibility of the left femoral vein in the distal thigh, nonocclusive with wall thickening. Popliteal Vein: No evidence of thrombus. Normal compressibility, respiratory phasicity and response to augmentation. Calf Veins: No evidence of thrombus. Normal compressibility and flow on color Doppler imaging. Superficial Great Saphenous Vein: No evidence of thrombus. Normal compressibility and flow on  color Doppler imaging. Other Findings:  None. IMPRESSION: Sonographic survey of the right lower extremity negative for DVT. Sonographic survey of the left lower extremity demonstrates chronic nonocclusive DVT of the distal femoral vein. Electronically Signed   By: Gilmer Mor D.O.   On: 11/13/2019 12:03   DG Chest Port 1 View  Result Date: 11/13/2019 CLINICAL DATA:  86 year old female with history of left-sided pneumothorax. EXAM: PORTABLE CHEST 1 VIEW COMPARISON:  Chest x-ray 11/12/2019. FINDINGS: Lung volumes are low. Opacity at the left base which may reflect atelectasis and/or consolidation with superimposed small to moderate left pleural effusion. Trace right pleural effusion. Trace left apical pneumothorax. No evidence of pulmonary edema. Heart size is mildly enlarged. Upper mediastinal contours are distorted by patient's position. Atherosclerotic calcifications in the thoracic aorta. Post vertebroplasty changes noted in the mid to lower thoracic spine. Left-sided pacemaker device in place with lead tips projecting over the expected location of the right atrium and right ventricle. IMPRESSION: 1. Persistent left-sided hydropneumothorax with very small left apical pneumothorax component and small to moderate left pleural effusion. 2. Trace right pleural effusion. 3. Mild cardiomegaly. 4. Aortic atherosclerosis. Electronically Signed   By: Trudie Reed M.D.   On: 11/13/2019 07:12   DG Chest Port 1 View  Result Date: 11/12/2019 CLINICAL DATA:  86 year old female with syncopal episode today. COVID-19 status pending. EXAM: PORTABLE CHEST 1 VIEW COMPARISON:  Portable chest 09/19/2019 and earlier. FINDINGS: Portable AP upright view at 1300 hours. Larger lung volumes but increased left lung base opacity which obscures the left hemidiaphragm  today. Stable cardiomegaly and mediastinal contours. Stable left chest pacemaker. The right lung appears clear. No pneumothorax. Chronic 2 level thoracic vertebral  augmentation. No acute osseous abnormality identified. IMPRESSION: 1. Larger lung volumes but new confluent left lung base opacity. Differential considerations include left pleural effusion, pneumonia, aspiration. 2. Right lung is clear.  Stable cardiomegaly. Electronically Signed   By: Odessa Fleming M.D.   On: 11/12/2019 13:23   ECHOCARDIOGRAM COMPLETE  Result Date: 11/13/2019    ECHOCARDIOGRAM REPORT   Patient Name:   Pamela [name] Date of Exam: 11/13/2019 Medical Rec #:  [id number]        Height:       65.0 in Accession #:    [id number]       Weight:       151.2 lb Date of Birth:  1920-12-27        BSA:          1.757 m Patient Age:    89 years         BP:           147/61 mmHg Patient Gender: F                HR:           79 bpm. [facility] Procedure: 2D Echo Indications:    Syncope 780.2 / R55  History:        Patient has prior history of Echocardiogram examinations, most                 recent 04/09/2013. Signs/Symptoms:Syncope; Risk                 Factors:Hypertension, Dyslipidemia and Non-Smoker. SSS (sick                 sinus syndrome) with PAT and marked bradycardia with 3 sec                 pauses , Chronic ischemic heart disease, SVT (supraventricular                 tachycardia.  Sonographer:    Jeryl Columbia RDCS (AE) Referring Phys: [id number] [address] IMPRESSIONS  1. Left ventricular ejection fraction, by estimation, is 60 to 65%. The left ventricle has normal function. The left ventricle has no regional wall motion abnormalities. There is mild left ventricular hypertrophy. Left ventricular diastolic parameters were normal.  2. Right ventricular systolic function is normal. The right ventricular size is normal. There is mildly elevated pulmonary artery systolic pressure. The estimated right ventricular systolic pressure is 32.4 mmHg.  3. The mitral valve is grossly normal. Trivial mitral valve regurgitation.  4. The aortic valve is tricuspid. Aortic valve regurgitation is  mild. Mild aortic valve sclerosis is present, with no evidence of aortic valve stenosis.  5. The inferior vena cava is normal in size with greater than 50% respiratory variability, suggesting right atrial pressure of 3 mmHg. FINDINGS  Left Ventricle: Left ventricular ejection fraction, by estimation, is 60 to 65%. The left ventricle has normal function. The left ventricle has no regional wall motion abnormalities. The left ventricular internal cavity size was normal in size. There is  mild left ventricular hypertrophy. Left ventricular diastolic parameters were normal. Right Ventricle: The right ventricular size is normal. No increase in right ventricular wall thickness. Right ventricular systolic function is normal. There is mildly elevated pulmonary artery systolic pressure. The tricuspid regurgitant velocity is 2.71  m/s,  and with an assumed right atrial pressure of 3 mmHg, the estimated right ventricular systolic pressure is 32.4 mmHg. Left Atrium: Left atrial size was normal in size. Right Atrium: Right atrial size was normal in size. Pericardium: There is no evidence of pericardial effusion. Presence of pericardial fat pad. Mitral Valve: The mitral valve is grossly normal. Trivial mitral valve regurgitation. Tricuspid Valve: The tricuspid valve is grossly normal. Tricuspid valve regurgitation is trivial. Aortic Valve: The aortic valve is tricuspid. Aortic valve regurgitation is mild. Mild aortic valve sclerosis is present, with no evidence of aortic valve stenosis. Pulmonic Valve: The pulmonic valve was grossly normal. Pulmonic valve regurgitation is trivial. Aorta: The aortic root is normal in size and structure. Venous: The inferior vena cava is normal in size with greater than 50% respiratory variability, suggesting right atrial pressure of 3 mmHg. IAS/Shunts: No atrial level shunt detected by color flow Doppler. Additional Comments: A pacer wire is visualized.  LEFT VENTRICLE PLAX 2D LVIDd:         4.30 cm   Diastology LVIDs:         2.57 cm  LV e' lateral:   7.18 cm/s LV PW:         1.12 cm  LV E/e' lateral: 9.6 LV IVS:        1.19 cm  LV e' medial:    6.31 cm/s LVOT diam:     2.10 cm  LV E/e' medial:  10.9 LVOT Area:     3.46 cm  RIGHT VENTRICLE TAPSE (M-mode): 2.0 cm LEFT ATRIUM             Index LA diam:        3.80 cm 2.16 cm/m LA Vol (A2C):   72.3 ml 41.16 ml/m LA Vol (A4C):   48.7 ml 27.72 ml/m LA Biplane Vol: 60.9 ml 34.67 ml/m   AORTA Ao Root diam: 3.20 cm MITRAL VALVE               TRICUSPID VALVE MV Area (PHT): 2.41 cm    TR Peak grad:   29.4 mmHg MV Decel Time: 315 msec    TR Vmax:        271.00 cm/s MV E velocity: 69.00 cm/s MV A velocity: 83.60 cm/s  SHUNTS MV E/A ratio:  0.83        Systemic Diam: 2.10 cm Nona Dell MD Electronically signed by Nona Dell MD Signature Date/Time: 11/13/2019/10:33:35 AM    Final      Scheduled Meds: . aspirin EC  81 mg Oral Daily  . calcium-vitamin D  1 tablet Oral Q breakfast  . carvedilol  12.5 mg Oral BID WC  . donepezil  10 mg Oral QHS  . folic acid  1 mg Oral Daily  . heparin  5,000 Units Subcutaneous Q8H  . hydrALAZINE  25 mg Oral BID  . hydrALAZINE  50 mg Oral Q1400  . memantine  10 mg Oral BID  . sertraline  25 mg Oral Daily  . sodium chloride flush  3 mL Intravenous Q12H   Continuous Infusions:  Active Problems:   SSS (sick sinus syndrome) with PAT and marked bradycardia with 3 sec pauses    Essential hypertension   Cardiac dysrhythmia   Syncope   Time spent:   Standley Dakins, MD [facility] 11/13/2019, 1:16 PM    LOS: 0 days  How to contact the [facility] Attending or Consulting provider 7A - 7P or covering provider during after hours 7P -7A,  for this patient?  1. Check the care team in [facility] and look for a) attending/consulting [facility] provider listed and b) the [facility] team listed 2. Log into [email or web address] and use [place]'s universal password to access. If you do not have the password, please contact the hospital  operator. 3. Locate the [facility] provider you are looking for under [facility] and page to a number that you can be directly reached. 4. If you still have difficulty reaching the provider, please page the [facility] (Director on Call) for the Hospitalists listed on amion for assistance.

## 2019-11-13 NOTE — Care Management Important Message (Signed)
Important Message  Patient Details  Name: Pamela [name] MRN: [id number] Date of Birth: Jan 28, 1921   Medicare Important Message Given:        Leitha Bleak, RN 11/13/2019, 4:30 PM

## 2019-11-13 NOTE — Progress Notes (Signed)
Palliative-  Consult received for goals of care, chart reviewed.  Evaluated patient at bedside. She has moderate dementia- unable to participate fully in goals of care discussion.  I called her listed contact- Delena Bali (son)- left a message on his mobile number requesting a return call. No answer at home number.   Ocie Bob, AGNP-C Palliative Medicine  Please call Palliative Medicine team phone with any questions [phone]. For individual providers please see AMION.  No charge

## 2019-11-13 NOTE — Progress Notes (Signed)
*  PRELIMINARY RESULTS* Echocardiogram 2D Echocardiogram has been performed.  Pamela [name] 11/13/2019, 10:28 AM

## 2019-11-13 NOTE — Consult Note (Signed)
Consultation Note Date: 11/13/2019   Patient Name: Pamela [name]  DOB: [birth year]  MRN: [id number]  Age / Sex: 86 y.o., female  PCP: Pamela School, MD Referring Physician: Murlean Iba, MD  Reason for Consultation: Establishing goals of care  HPI/Patient Profile: 86 y.o. female  with past medical history of macular degeneration, sick sinus syndrome s/p pacemaker and ICD, DVT, hypertension, dementia, chronic kidney disease, prosthetic hip admitted on 11/12/2019 with syncopal episode while getting her hair done.  Syncope is of unclear etiology thus far. ECHO completed today with normal EF, mild pulmonary hypertension, normal valves. Dopplar showed known chronic DVT. She has a small pneumothorax- surgery consulted and recommended observation.  Palliative medicine consulted for goals of care.     Clinical Assessment and Goals of Care:  I have reviewed medical records including EPIC notes, labs and imaging, examined the patient and met at bedside with patient's son- Pamela [name] and daughter in law Pamela [name] to discuss diagnosis, prognosis, [place], EOL wishes, disposition and options.  I introduced Palliative Medicine as specialized medical care for people living with serious illness. It focuses on providing relief from the symptoms and stress of a serious illness.   We discussed a brief life review of the patient. She is one of several siblings- she is one of two who are left surviving. Pamela [name] is her only child. She is known to have a good sense of humor.  As far as functional and nutritional status- prior to admission- she is able to ambulate minimally with a walker- has had frequent falls. Requires assistance with all ADLs, incontinent at times. She eats well, able to feed herself- but must have food chopped and plate prepared for her. She enjoys being able to talk and interact with others.    We discussed  her current illness and what it means in the larger context of her on-going co-morbidities.  Natural disease trajectory and expectations at EOL were discussed. We discussed the trajectory of dementia over time and what anticipatory decision making may occur. MOST form was reviewed- but not completed.   Advanced directives, concepts specific to code status, artifical feeding and hydration, and rehospitalization were considered and discussed. We discussed that full resuscitation efforts in an older patient with dementia does not typically have good outcomes- does not improve quality of life. Pamela [name] notes that he does envision patient spending her last days at [facility]- we discussed if that were the goal then a DNR order would be appropriate. Pamela [name] notes Pamela [name] does have advanced directives but he is uncertain where they are- [place] would not want to be maintained on artificial life support in the event of her likely death. She would not want a feeding tube if she were to become unable to support herself with oral intake.   Palliative Care services outpatient were explained and offered. Family accepts outpatient Palliative follow-up for continued monitering of trajectory and goals of care.   Questions and concerns were addressed.  The family was encouraged to call with questions or  concerns.    Primary Decision Maker NEXT OF KIN- patient's son- Pamela [name]    SUMMARY OF RECOMMENDATIONS -Continue current scope of care -Family likely to agree to DNR status- but wish to inform their children first- recommend followup with outpatient Palliative or primary care MD for further code status discussion -GOC- continue current interventions, treat what is treatable- family understands patient has chronic illnesses that will lead to decline and eventually death, realistic in their goals which are to maintain current level of functioning and quality of life, would elect to treat future infections, dehydration for now- but  would consider each intervention at the time it is offered and in the context of patient's quality of life and functional status. -Request that nursing staff prepare food trays, cut up food for patient- she needs minimal assistance with feeding- but does need help with preparation and reminders    Code Status/Advance Care Planning:  Full code  Prognosis:    Unable to determine likely over six months to a year or more- this was discussed with family  Discharge Planning: Home with Palliative Services  Primary Diagnoses: Present on Admission: . Cardiac dysrhythmia . Essential hypertension . SSS (sick sinus syndrome) with PAT and marked bradycardia with 3 sec pauses  . Pneumothorax   I have reviewed the medical record, interviewed the patient and family, and examined the patient. The following aspects are pertinent.  Past Medical History:  Diagnosis Date  . Coronary artery disease    stent in the LAD artery in 2002  . Gait disorder 03/05/2017  . HTN (hypertension) 08/30/2012  . Hyperlipidemia 08/30/2012  . ICD (implantable cardiac defibrillator) in place   . Macular degeneration   . Pacemaker 08/29/2012   st jude accent DR RF device, model number [id number], serial number [id number], implanted 08/29/2012 for sinus bradycardia, runs of supraventricular tachycardia  last checked 10/23/2012  . Polyneuropathy in other diseases classified elsewhere ([place])   . Shingles 08/2016  . SSS (sick sinus syndrome) with PAT and marked bradycardia with 3 sec pauses  08/30/2012  . Status post placement of cardiac pacemaker. 08/29/12 St. Jude device 08/30/2012  . Venous insufficiency    Social History   Socioeconomic History  . Marital status: Widowed    Spouse name: Not on file  . Number of children: 2  . Years of education: Not on file  . Highest education level: Not on file  Occupational History    Employer: RETIRED  Tobacco Use  . Smoking status: Never Smoker  . Smokeless tobacco: Never Used    Substance and Sexual Activity  . Alcohol use: No    Alcohol/week: 0.0 standard drinks  . Drug use: No  . Sexual activity: Not on file  Other Topics Concern  . Not on file  Social History Narrative   Patient drinks about 2 cups of caffeine daily.   Patient is right handed.   Social Determinants of Health   Financial Resource Strain:   . Difficulty of Paying Living Expenses:   Food Insecurity:   . Worried About Charity fundraiser in the Last Year:   . Arboriculturist in the Last Year:   Transportation Needs:   . Film/video editor (Medical):   Marland Kitchen Lack of Transportation (Non-Medical):   Physical Activity:   . Days of Exercise per Week:   . Minutes of Exercise per Session:   Stress:   . Feeling of Stress :   Social Connections:   . Frequency of  Communication with Friends and Family:   . Frequency of Social Gatherings with Friends and Family:   . Attends Religious Services:   . Active Member of Clubs or Organizations:   . Attends Archivist Meetings:   Marland Kitchen Marital Status:    Family History  Problem Relation Age of Onset  . Heart disease Mother   . Congestive Heart Failure Mother   . Heart disease Father   . Heart attack Father   . Heart disease Brother    Scheduled Meds: . aspirin EC  81 mg Oral Daily  . calcium-vitamin D  1 tablet Oral Q breakfast  . carvedilol  12.5 mg Oral BID WC  . donepezil  10 mg Oral QHS  . folic acid  1 mg Oral Daily  . heparin  5,000 Units Subcutaneous Q8H  . hydrALAZINE  25 mg Oral BID  . hydrALAZINE  50 mg Oral Q1400  . memantine  10 mg Oral BID  . sertraline  25 mg Oral Daily  . sodium chloride flush  3 mL Intravenous Q12H   Continuous Infusions: PRN Meds:.acetaminophen **OR** acetaminophen Medications Prior to Admission:  Prior to Admission medications   Medication Sig Start Date End Date Taking? Authorizing Provider  aspirin EC 81 MG tablet Take 81 mg by mouth every morning.    Yes [provider]   calcium-vitamin D (OSCAL WITH D) 500-200 MG-UNIT tablet Take 1 tablet by mouth daily.   Yes [provider]  carvedilol (COREG) 12.5 MG tablet TAKE ONE TABLET (12.5MG TOTAL) BY MOUTH TWO TIMES DAILY Patient taking differently: Take 12.5 mg by mouth 2 (two) times daily with a meal.  03/28/19  Yes Evans Lance, MD  Cholecalciferol (VITAMIN D-3 PO) Take 2,000 Units by mouth daily.    Yes [provider]  docusate sodium (COLACE) 100 MG capsule Take 1 capsule (100 mg total) by mouth 2 (two) times daily. 04/01/16  Yes Iran Planas, MD  donepezil (ARICEPT) 10 MG tablet TAKE ONE TABLET BY MOUTH EVERY NIGHT AT BEDTIME 10/16/18  Yes Kathrynn Ducking, MD  folic acid (FOLVITE) 1 MG tablet Take 1 mg by mouth daily.   Yes [provider]  hydrALAZINE (APRESOLINE) 25 MG tablet TAKE ONE TABLET BY MOUTH IN THE MORNING,TWO TABLETS AT NOON, AND ONE TABLET IN THE EVENING Patient taking differently: Take 25-50 mg by mouth See admin instructions. TAKE ONE TABLET BY MOUTH IN THE MORNING,TWO TABLETS AT NOON, AND ONE TABLET IN THE EVENING 07/29/19  Yes Evans Lance, MD  memantine (NAMENDA) 10 MG tablet TAKE ONE TABLET (10MG TOTAL) BY MOUTH TWO TIMES DAILY Patient taking differently: Take 10 mg by mouth 2 (two) times daily.  07/17/19  Yes Suzzanne Cloud, NP  Multiple Vitamins-Minerals (OCUVITE PO) Take 1 tablet by mouth 2 (two) times daily.   Yes [provider]  ramipril (ALTACE) 5 MG capsule TAKE ONE CAPSULE (5MG TOTAL) BY MOUTH DAILY Patient taking differently: Take 5 mg by mouth daily.  12/03/18  Yes Evans Lance, MD  sertraline (ZOLOFT) 25 MG tablet Take 25 mg by mouth daily.   Yes [provider]  acetaminophen (TYLENOL) 500 MG tablet Take 500 mg by mouth every 6 (six) hours as needed for mild pain or moderate pain.     [provider]   Allergies  Allergen Reactions  . Gabapentin Other (See Comments)    Blurred vision, dizziness    Review of Systems   Unable to perform ROS:  Dementia    Physical Exam Vitals and nursing note reviewed.  Constitutional:      General: She is not in acute distress.    Appearance: Normal appearance. She is normal weight. She is not ill-appearing.  Cardiovascular:     Rate and Rhythm: Normal rate.  Pulmonary:     Effort: Pulmonary effort is normal.  Musculoskeletal:        General: No swelling.  Skin:    General: Skin is warm and dry.  Neurological:     Mental Status: She is alert.     Comments: Oriented to person only     Vital Signs: BP (!) 147/61 (BP Location: Right Arm)   Pulse 79   Temp 98.6 F (37 C) (Oral)   Resp 18   Ht '5\' 5"'  (1.651 m)   Wt 68.6 kg   SpO2 95%   BMI 25.17 kg/m  Pain Scale: 0-10   Pain Score: 0-No pain   SpO2: SpO2: 95 % O2 Device:SpO2: 95 % O2 Flow Rate: .   IO: Intake/output summary:   Intake/Output Summary (Last 24 hours) at 11/13/2019 1422 Last data filed at 11/13/2019 0900 Gross per 24 hour  Intake 120 ml  Output 250 ml  Net -130 ml    LBM:   Baseline Weight: Weight: 68.5 kg Most recent weight: Weight: 68.6 kg     Palliative Assessment/Data: 30%     Thank you for this consult. Palliative medicine will continue to follow and assist as needed.   Time In: 1320 Time Out: 1430 Time Total: 70 minutes Greater than 50%  of this time was spent counseling and coordinating care related to the above assessment and plan.  Signed by: Mariana Kaufman, AGNP-C Palliative Medicine    Please contact Palliative Medicine Team phone at [phone] for questions and concerns.  For individual provider: See Shea Evans

## 2019-11-13 NOTE — TOC Initial Note (Signed)
Transition of Care [facility]) - Initial/Assessment Note    Patient Details  Name: Pamela [name] MRN: [id number] Date of Birth: 30-Dec-1920  Transition of Care [facility]) CM/SW Contact:    Boneta Lucks, RN Phone Number: 11/13/2019, 3:06 PM  Clinical Narrative:       Patient admitted for Syncope. Patient lives at [place]. PT is recommending HHPT.  Patient is [facility] with [facility].  TOC will follow for resumption orders.             Expected Discharge Plan: Assisted Living Barriers to Discharge: Continued Medical Work up  Patient Goals and CMS Choice Patient states their goals for this hospitalization and ongoing recovery are:: to go back to ALF. CMS Medicare.gov Compare Post Acute Care list provided to:: Patient Represenative (must comment)   Expected Discharge Plan and Services Expected Discharge Plan: Assisted Living    Living arrangements for the past 2 months: Assisted Living Facility                  HH Arranged: PT HH Agency: [place] Date [place]: 11/13/19 Time HH Agency Contacted: 1210 Representative spoke with at [place]: [place]  Prior Living Arrangements/Services Living arrangements for the past 2 months: [place] Lives with:: Facility Resident   Do you feel safe going back to the place where you live?: No      Need for Family Participation in Patient Care: Yes (Comment) Care giver support system in place?: Yes (comment)   Criminal Activity/Legal Involvement Pertinent to Current Situation/Hospitalization: No - Comment as needed  Activities of Daily Living Home Assistive Devices/Equipment: Environmental consultant (specify type) ADL Screening (condition at time of admission) Patient's cognitive ability adequate to safely complete daily activities?: Yes Is the patient deaf or have difficulty hearing?: No Does the patient have difficulty seeing, even when wearing glasses/contacts?: No Does the patient have difficulty  concentrating, remembering, or making decisions?: No Patient able to express need for assistance with ADLs?: Yes Does the patient have difficulty dressing or bathing?: Yes Independently performs ADLs?: No Communication: Independent Dressing (OT): Needs assistance Is this a change from baseline?: Pre-admission baseline Grooming: Needs assistance Is this a change from baseline?: Pre-admission baseline Feeding: Needs assistance Is this a change from baseline?: Pre-admission baseline Bathing: Needs assistance Toileting: Needs assistance Is this a change from baseline?: Pre-admission baseline In/Out Bed: Needs assistance Is this a change from baseline?: Pre-admission baseline Walks in Home: Needs assistance Is this a change from baseline?: Pre-admission baseline Does the patient have difficulty walking or climbing stairs?: Yes Weakness of Legs: Both Weakness of Arms/Hands: None  Permission Sought/Granted     Emotional Assessment     Admission diagnosis:  Edema [R60.9] Syncope [R55] Pleural effusion, bilateral [J90] Pneumothorax, left [J93.9] Syncope, unspecified syncope type [R55] Pneumothorax [J93.9] Patient Active Problem List   Diagnosis Date Noted  . Pneumothorax 11/13/2019  . Syncope 11/12/2019  . Other pancytopenia ([place]) 06/26/2017  . Gait disorder 03/05/2017  . Urinary frequency 08/10/2015  . Postoperative anemia due to acute blood loss 07/27/2015  . SVT (supraventricular tachycardia) ([place]) 07/26/2015  . Femur fracture, left ([place]) 07/24/2015  . Chronic kidney disease 07/24/2015  . Periprosthetic fracture around internal prosthetic left hip joint ([place]) 07/24/2015  . Fall at home   . DVT (deep venous thrombosis) ([place]) 01/30/2014  . Occult blood positive stool 01/30/2014  . Thrombocytopenia, unspecified ([place]) 01/06/2014  . Anemia, iron deficiency 01/06/2014  . Hip fracture, left ([place]) 01/03/2014  .  QT prolongation 01/03/2014  . Atherosclerotic cardiovascular  disease 08/13/2013  . Hypertensive emergency 04/08/2013  . Dizzy 04/08/2013  . PAT (paroxysmal atrial tachycardia) ([facility]) 12/25/2012  . SSS (sick sinus syndrome) with PAT and marked bradycardia with 3 sec pauses  08/30/2012  . Status post placement of cardiac pacemaker. 08/29/12 St. Jude device 08/30/2012  . HTN (hypertension) 08/30/2012  . Hyperlipidemia 08/30/2012  . Osteoporosis, unspecified 04/19/2012  . Essential and other specified forms of tremor 04/19/2012  . Essential hypertension 04/19/2012  . Cardiac dysrhythmia 04/19/2012  . Chronic ischemic heart disease 04/19/2012  . Hereditary and idiopathic peripheral neuropathy 04/19/2012  . Memory loss 04/19/2012  . Disturbance of skin sensation 04/19/2012  . Pain in joint, shoulder region 11/20/2011  . Status post complete repair of rotator cuff 11/20/2011  . Muscle weakness (generalized) 11/20/2011   PCP:  Elfredia Nevins, MD Pharmacy:   [facility] - [place], [place] - [address] [place] [address] Phone: [phone] Fax: [phone]

## 2019-11-13 NOTE — Consult Note (Signed)
[facility] Consult  Reason for Consult: Left apical pneumothorax  Referring Physician:  Dr. Wynetta Emery   Chief Complaint    Loss of Consciousness      HPI: Pamela [name] is a 86 y.o. female with a history of being at her SNF and having a syncope. She has a history of tachybradycardia syndrome and a pacemaker in place.  She otherwise has CAD, HLD, HTN, and was getting her hair done and lost consciousness.  She had no fall or trauma reported and was laid on the floor by the staff.  She has had increased falls and worsening dementia in the last little bit.  She was worked up in the ED and found to have a small apical pneumothorax. She has no complaints of CP, SOB or any desaturations.   She was eating this AM and denied any pain or issues.    Past Medical History:  Diagnosis Date  . Coronary artery disease    stent in the LAD artery in 2002  . Gait disorder 03/05/2017  . HTN (hypertension) 08/30/2012  . Hyperlipidemia 08/30/2012  . ICD (implantable cardiac defibrillator) in place   . Macular degeneration   . Pacemaker 08/29/2012   st jude accent DR RF device, model number [id number], serial number [id number], implanted 08/29/2012 for sinus bradycardia, runs of supraventricular tachycardia  last checked 10/23/2012  . Polyneuropathy in other diseases classified elsewhere ([place])   . Shingles 08/2016  . SSS (sick sinus syndrome) with PAT and marked bradycardia with 3 sec pauses  08/30/2012  . Status post placement of cardiac pacemaker. 08/29/12 St. Jude device 08/30/2012  . Venous insufficiency     Past Surgical History:  Procedure Laterality Date  . ABDOMINAL HYSTERECTOMY    . basal cell  06/2017   R side of face.  Marland Kitchen CARDIAC CATHETERIZATION  2006   3 stents placed  . CARDIAC CATHETERIZATION  06/2001   placement of BiodivYsio 2.5.10mm stent dilated to 2.38m in proximal left anterior descenting stenotic lesion  . CESAREAN SECTION    . COLONOSCOPY W/ POLYPECTOMY    . HIP  ARTHROPLASTY Left 01/04/2014   Procedure: ARTHROPLASTY BIPOLAR HIP;  Surgeon: MMauri Pole MD;  [facility]  Service: Orthopedics;  Laterality: Left;  . INSERT / REPLACE / REMOVE PACEMAKER  08/29/2012   st jude   . KIDNEY STONE SURGERY    . OPEN REDUCTION INTERNAL FIXATION (ORIF) DISTAL RADIAL FRACTURE Right 04/01/2016   Procedure: OPEN REDUCTION INTERNAL FIXATION (ORIF) RIGHT DISTAL RADIAL FRACTURE AND REPAIR;  Surgeon: FIran Planas MD;  [facility]  Service: Orthopedics;  Laterality: Right;  . PERMANENT PACEMAKER INSERTION N/A 08/29/2012   Procedure: PERMANENT PACEMAKER INSERTION;  Surgeon: MSanda Klein MD;  [facility];  Service: Cardiovascular;  Laterality: N/A;  . RADIOFREQUENCY ABLATION  08/25/2010   ablation of left greater saphenous vein  . ROTATOR CUFF REPAIR Left   . ROTATOR CUFF REPAIR Right   . TOTAL HIP REVISION Left 07/24/2015   Procedure: ORIF LEFT PERIPROSTHETIC FEMUR FRACTURE; CONVERSION TO TOTAL HIP ARTHROPLASTY POSS. FEMORAL COMPONENT REVISION ;  Surgeon: BRod Can MD;  [place] [facility]  Service: Orthopedics;  Laterality: Left;    Family History  Problem Relation Age of Onset  . Heart disease Mother   . Congestive Heart Failure Mother   . Heart disease Father   . Heart attack Father   . Heart disease Brother     Social History   Tobacco Use  .  Smoking status: Never Smoker  . Smokeless tobacco: Never Used  Substance Use Topics  . Alcohol use: No    Alcohol/week: 0.0 standard drinks  . Drug use: No    Medications:  I have reviewed the patient's current medications. Prior to Admission:  Medications Prior to Admission  Medication Sig Dispense Refill Last Dose  . aspirin EC 81 MG tablet Take 81 mg by mouth every morning.    11/12/2019 at Unknown time  . calcium-vitamin D (OSCAL WITH D) 500-200 MG-UNIT tablet Take 1 tablet by mouth daily.   11/12/2019 at Unknown time  . carvedilol (COREG) 12.5 MG tablet TAKE ONE TABLET (12.5MG TOTAL)  BY MOUTH TWO TIMES DAILY (Patient taking differently: Take 12.5 mg by mouth 2 (two) times daily with a meal. ) 180 tablet 3 11/12/2019 at 0900  . Cholecalciferol (VITAMIN D-3 PO) Take 2,000 Units by mouth daily.    11/12/2019 at Unknown time  . docusate sodium (COLACE) 100 MG capsule Take 1 capsule (100 mg total) by mouth 2 (two) times daily. 10 capsule 0 11/12/2019 at Unknown time  . donepezil (ARICEPT) 10 MG tablet TAKE ONE TABLET BY MOUTH EVERY NIGHT AT BEDTIME 30 tablet 11 11/11/2019 at Unknown time  . folic acid (FOLVITE) 1 MG tablet Take 1 mg by mouth daily.   11/12/2019 at Unknown time  . hydrALAZINE (APRESOLINE) 25 MG tablet TAKE ONE TABLET BY MOUTH IN THE MORNING,TWO TABLETS AT NOON, AND ONE TABLET IN THE EVENING (Patient taking differently: Take 25-50 mg by mouth See admin instructions. TAKE ONE TABLET BY MOUTH IN THE MORNING,TWO TABLETS AT NOON, AND ONE TABLET IN THE EVENING) 360 tablet 3 11/12/2019 at Unknown time  . memantine (NAMENDA) 10 MG tablet TAKE ONE TABLET (10MG TOTAL) BY MOUTH TWO TIMES DAILY (Patient taking differently: Take 10 mg by mouth 2 (two) times daily. ) 180 tablet 1 11/12/2019 at Unknown time  . Multiple Vitamins-Minerals (OCUVITE PO) Take 1 tablet by mouth 2 (two) times daily.   11/12/2019 at Unknown time  . ramipril (ALTACE) 5 MG capsule TAKE ONE CAPSULE (5MG TOTAL) BY MOUTH DAILY (Patient taking differently: Take 5 mg by mouth daily. ) 90 capsule 3 11/12/2019 at Unknown time  . sertraline (ZOLOFT) 25 MG tablet Take 25 mg by mouth daily.   11/12/2019 at Unknown time  . acetaminophen (TYLENOL) 500 MG tablet Take 500 mg by mouth every 6 (six) hours as needed for mild pain or moderate pain.     at PRN   Scheduled: . aspirin EC  81 mg Oral Daily  . calcium-vitamin D  1 tablet Oral Q breakfast  . carvedilol  12.5 mg Oral BID WC  . donepezil  10 mg Oral QHS  . folic acid  1 mg Oral Daily  . heparin  5,000 Units Subcutaneous Q8H  . hydrALAZINE  25 mg Oral BID  . hydrALAZINE  50  mg Oral Q1400  . memantine  10 mg Oral BID  . sertraline  25 mg Oral Daily  . sodium chloride flush  3 mL Intravenous Q12H   Continuous:  ZPH:[id number] **OR** acetaminophen  Allergies  Allergen Reactions  . Gabapentin Other (See Comments)    Blurred vision, dizziness      ROS:  Review of systems not obtained due to patient factors. Some degree of dementia, no reported pain or SOB this AM  Blood pressure (!) 147/61, pulse 79, temperature 98.6 F (37 C), temperature source Oral, resp. rate 18, height 5' 5" (1.651 m),  weight 68.6 kg, SpO2 95 %. Physical Exam Vitals reviewed.  Constitutional:      Appearance: She is normal weight.  HENT:     Head: Normocephalic and atraumatic.     Nose: Nose normal.     Mouth/Throat:     Mouth: Mucous membranes are moist.  Eyes:     Extraocular Movements: Extraocular movements intact.     Pupils: Pupils are equal, round, and reactive to light.  Cardiovascular:     Rate and Rhythm: Normal rate.  Pulmonary:     Effort: Pulmonary effort is normal.     Breath sounds: Normal breath sounds. No wheezing.  Abdominal:     General: There is no distension.     Palpations: Abdomen is soft.     Tenderness: There is no abdominal tenderness.  Musculoskeletal:        General: No swelling. Normal range of motion.     Cervical back: Normal range of motion.  Skin:    General: Skin is warm and dry.  Neurological:     General: No focal deficit present.     Mental Status: She is alert. Mental status is at baseline.  Psychiatric:        Mood and Affect: Mood normal.        Behavior: Behavior normal.        Thought Content: Thought content normal.     Results: Results for orders placed or performed during the hospital encounter of 11/12/19 (from the past 48 hour(s))  Urinalysis, Routine w reflex microscopic     Status: Abnormal   Collection Time: 11/12/19 12:58 PM  Result Value Ref Range   Color, Urine YELLOW YELLOW   APPearance CLEAR CLEAR    Specific Gravity, Urine >1.046 (H) 1.005 - 1.030   pH 5.0 5.0 - 8.0   Glucose, UA NEGATIVE NEGATIVE mg/dL   Hgb urine dipstick NEGATIVE NEGATIVE   Bilirubin Urine NEGATIVE NEGATIVE   Ketones, ur NEGATIVE NEGATIVE mg/dL   Protein, ur NEGATIVE NEGATIVE mg/dL   Nitrite NEGATIVE NEGATIVE   Leukocytes,Ua NEGATIVE NEGATIVE    Comment: Performed at [facility], [address], [place], [place] [address]  Comprehensive metabolic panel     Status: Abnormal   Collection Time: 11/12/19 12:59 PM  Result Value Ref Range   Sodium 137 135 - 145 mmol/L   Potassium 3.8 3.5 - 5.1 mmol/L   Chloride 101 98 - 111 mmol/L   CO2 27 22 - 32 mmol/L   Glucose, Bld 127 (H) 70 - 99 mg/dL    Comment: Glucose reference range applies only to samples taken after fasting for at least 8 hours.   BUN 23 8 - 23 mg/dL   Creatinine, Ser 1.09 (H) 0.44 - 1.00 mg/dL   Calcium 9.7 8.9 - 10.3 mg/dL   Total Protein 5.8 (L) 6.5 - 8.1 g/dL   Albumin 3.5 3.5 - 5.0 g/dL   AST 23 15 - 41 U/L   ALT 16 0 - 44 U/L   Alkaline Phosphatase 59 38 - 126 U/L   Total Bilirubin 0.8 0.3 - 1.2 mg/dL   GFR calc non Af Amer 43 (L) >60 mL/min   GFR calc Af Amer 50 (L) >60 mL/min   Anion gap 9 5 - 15    Comment: Performed at [facility], [address], [place], [place] [address]  CBC WITH DIFFERENTIAL     Status: Abnormal   Collection Time: 11/12/19 12:59 PM  Result Value Ref Range  WBC 4.7 4.0 - 10.5 K/uL   RBC 3.75 (L) 3.87 - 5.11 MIL/uL   Hemoglobin 10.5 (L) 12.0 - 15.0 g/dL   HCT 34.2 (L) 36.0 - 46.0 %   MCV 91.2 80.0 - 100.0 fL   MCH 28.0 26.0 - 34.0 pg   MCHC 30.7 30.0 - 36.0 g/dL   RDW 16.3 (H) 11.5 - 15.5 %   Platelets 143 (L) 150 - 400 K/uL   nRBC 0.0 0.0 - 0.2 %   Neutrophils Relative % 63 %   Neutro Abs 3.0 1.7 - 7.7 K/uL   Lymphocytes Relative 21 %   Lymphs Abs 1.0 0.7 - 4.0 K/uL   Monocytes Relative 13 %   Monocytes Absolute 0.6 0.1 - 1.0 K/uL   Eosinophils Relative 2 %   Eosinophils Absolute 0.1 0.0 - 0.5 K/uL    Basophils Relative 0 %   Basophils Absolute 0.0 0.0 - 0.1 K/uL   Immature Granulocytes 1 %   Abs Immature Granulocytes 0.03 0.00 - 0.07 K/uL    Comment: Performed at [facility], [address], [place], [place] [address]  Troponin I (High Sensitivity)     Status: Abnormal   Collection Time: 11/12/19 12:59 PM  Result Value Ref Range   Troponin I (High Sensitivity) 22 (H) <18 ng/L    Comment: (NOTE) Elevated high sensitivity troponin I (hsTnI) values and significant  changes across serial measurements may suggest ACS but many other  chronic and acute conditions are known to elevate hsTnI results.  Refer to the "Links" section for chest pain algorithms and additional  guidance. Performed at [facility], [address] [place], [place] [address]   D-dimer, quantitative     Status: Abnormal   Collection Time: 11/12/19 12:59 PM  Result Value Ref Range   D-Dimer, Quant 6.58 (H) 0.00 - 0.50 ug/mL-FEU    Comment: (NOTE) At the manufacturer cut-off of 0.50 ug/mL FEU, this assay has been documented to exclude PE with a sensitivity and negative predictive value of 97 to 99%.  At this time, this assay has not been approved by the FDA to exclude DVT/VTE. Results should be correlated with clinical presentation. Performed at [facility], [address], [place], [place] [address]   Brain natriuretic peptide     Status: Abnormal   Collection Time: 11/12/19  1:00 PM  Result Value Ref Range   B Natriuretic Peptide 195.0 (H) 0.0 - 100.0 pg/mL    Comment: Performed at [facility], [address], [place],  [address]  POC SARS Coronavirus 2 Ag-ED - Nasal Swab (BD Veritor Kit)     Status: None   Collection Time: 11/12/19  2:12 PM  Result Value Ref Range   SARS Coronavirus 2 Ag NEGATIVE NEGATIVE    Comment: (NOTE) SARS-CoV-2 antigen NOT DETECTED.  Negative results are presumptive.  Negative results do not preclude SARS-CoV-2 infection and should not be used as the sole basis  for treatment or other patient management decisions, including infection  control decisions, particularly in the presence of clinical signs and  symptoms consistent with COVID-19, or in those who have been in contact with the virus.  Negative results must be combined with clinical observations, patient history, and epidemiological information. The expected result is Negative. Fact Sheet for Patients: PodPark.tn Fact Sheet for Healthcare Providers: GiftContent.is This test is not yet approved or cleared by the [place] FDA and  has been authorized for detection and/or diagnosis of SARS-CoV-2 by FDA under an Emergency Use  Authorization (EUA).  This EUA will remain in effect (meaning this test can be used) for the duration of  the COVID-19 de claration under Section 564(b)(1) of the Act, 21 U.S.C. section 360bbb-3(b)(1), unless the authorization is terminated or revoked sooner.   Troponin I (High Sensitivity)     Status: Abnormal   Collection Time: 11/12/19  2:52 PM  Result Value Ref Range   Troponin I (High Sensitivity) 20 (H) <18 ng/L    Comment: (NOTE) Elevated high sensitivity troponin I (hsTnI) values and significant  changes across serial measurements may suggest ACS but many other  chronic and acute conditions are known to elevate hsTnI results.  Refer to the "Links" section for chest pain algorithms and additional  guidance. Performed at [facility], [address] [place], [place] [address]   Glucose, capillary     Status: None   Collection Time: 11/13/19  5:04 AM  Result Value Ref Range   Glucose-Capillary 86 70 - 99 mg/dL    Comment: Glucose reference range applies only to samples taken after fasting for at least 8 hours.  Basic metabolic panel     Status: Abnormal   Collection Time: 11/13/19  5:05 AM  Result Value Ref Range   Sodium 138 135 - 145 mmol/L   Potassium 3.8 3.5 - 5.1 mmol/L   Chloride 102 98 -  111 mmol/L   CO2 27 22 - 32 mmol/L   Glucose, Bld 94 70 - 99 mg/dL    Comment: Glucose reference range applies only to samples taken after fasting for at least 8 hours.   BUN 25 (H) 8 - 23 mg/dL   Creatinine, Ser 1.03 (H) 0.44 - 1.00 mg/dL   Calcium 9.1 8.9 - 10.3 mg/dL   GFR calc non Af Amer 46 (L) >60 mL/min   GFR calc Af Amer 53 (L) >60 mL/min   Anion gap 9 5 - 15    Comment: Performed at [facility], [address], [place], [place] [address]  CBC     Status: Abnormal   Collection Time: 11/13/19  5:05 AM  Result Value Ref Range   WBC 4.1 4.0 - 10.5 K/uL   RBC 3.65 (L) 3.87 - 5.11 MIL/uL   Hemoglobin 10.3 (L) 12.0 - 15.0 g/dL   HCT 33.8 (L) 36.0 - 46.0 %   MCV 92.6 80.0 - 100.0 fL   MCH 28.2 26.0 - 34.0 pg   MCHC 30.5 30.0 - 36.0 g/dL   RDW 16.3 (H) 11.5 - 15.5 %   Platelets 153 150 - 400 K/uL   nRBC 0.0 0.0 - 0.2 %    Comment: Performed at [facility], [address] [place], [place] [address]  Magnesium     Status: None   Collection Time: 11/13/19  5:05 AM  Result Value Ref Range   Magnesium 1.8 1.7 - 2.4 mg/dL    Comment: Performed at [facility], [address], [place],  [address]  Glucose, capillary     Status: None   Collection Time: 11/13/19  7:18 AM  Result Value Ref Range   Glucose-Capillary 86 70 - 99 mg/dL    Comment: Glucose reference range applies only to samples taken after fasting for at least 8 hours.   Personally reviewed CT and CXR-  Left apical pneumo on CXR and on CT with some anterior pneumo on CT, significant scarring in the apex of the left with thickened pleura, blebs seen on CT   CT Head Wo Contrast  Result Date: 11/12/2019 CLINICAL DATA:  Ataxia, stroke suspected. Additional history provided: Syncopal episode. EXAM: CT HEAD WITHOUT CONTRAST TECHNIQUE: Contiguous axial images were obtained from the base of the skull through the vertex without intravenous contrast. COMPARISON:  Head CT 04/21/2017 FINDINGS: Brain: There is no evidence of  acute intracranial hemorrhage, intracranial mass, midline shift or extra-axial fluid collection.No demarcated cortical infarction. Moderate patchy and confluent hypodensity within the cerebral white matter is nonspecific, but consistent with chronic small vessel ischemic disease. Findings are similar to prior head CT 04/21/2017. Stable, mild generalized parenchymal atrophy. Vascular: No hyperdense vessel.  Atherosclerotic calcifications. Skull: Normal. Negative for fracture or focal lesion. Sinuses/Orbits: Visualized orbits demonstrate no acute abnormality. Mild ethmoid sinus mucosal thickening. No significant mastoid effusion. IMPRESSION: No evidence of acute intracranial abnormality. Stable generalized parenchymal atrophy and chronic small vessel ischemic disease as compared to head CT 04/21/2017. Mild ethmoid sinus mucosal thickening. Electronically Signed   By: Kellie Simmering DO   On: 11/12/2019 13:43   CT Angio Chest PE W and/or Wo Contrast  Result Date: 11/12/2019 CLINICAL DATA:  Syncopal episodes EXAM: CT ANGIOGRAPHY CHEST WITH CONTRAST TECHNIQUE: Multidetector CT imaging of the chest was performed using the standard protocol during bolus administration of intravenous contrast. Multiplanar CT image reconstructions and MIPs were obtained to evaluate the vascular anatomy. CONTRAST:  81m OMNIPAQUE IOHEXOL 350 MG/ML SOLN COMPARISON:  Radiograph same day, CT September 26, 2016 FINDINGS: Cardiovascular: There is a optimal opacification of the pulmonary arteries. There is no central,segmental, or subsegmental filling defects within the pulmonary arteries. There is mild cardiomegaly. No pericardial effusion or thickening. No evidence right heart strain. There is normal three-vessel brachiocephalic anatomy without proximal stenosis. The thoracic aorta is normal in appearance. Scattered aortic atherosclerosis is noted. Mediastinum/Nodes: No hilar, mediastinal, or axillary adenopathy. Thyroid gland, trachea, and  esophagus demonstrate no significant findings. Lungs/Pleura: A small left apical pneumothorax is seen. Small bilateral pleural effusions are present, left greater than right. There is mild interlobular septal thickening seen at both lung bases. Patchy atelectasis seen adjacent to the pleural effusions. There is scarring with small calcifications seen at the left lung apex. Bleb formation seen within the anterior left upper lobe. Upper Abdomen: No acute abnormalities present in the visualized portions of the upper abdomen. Musculoskeletal: New since the prior exam is advanced compression deformity of the T6 vertebral body with 80% loss in vertebral body height. There is also slight superior compression deformity of the T7 vertebral body with less than 25% loss in height. Inferior endplate compression deformity of the T11 vertebral body is noted with air between the disc space and less than 25% loss in height. There is been prior cement fixation of the T8 and T9 vertebral bodies. Review of the MIP images confirms the above findings. IMPRESSION: 1. Small left apical pneumothorax. 2. No central, segmental, or subsegmental pulmonary embolism. 3. Age indeterminate, however new since prior exam, compression deformities of the T6, T7, and T11 vertebral bodies as described above. No retropulsion of fragments. 4. Small bilateral pleural effusions, left greater than right with adjacent basilar atelectasis. 5. These results were called by telephone at the time of interpretation on 11/12/2019 at 4:22 pm to provider Dr. ZRogene Houston who verbally acknowledged these results. Electronically Signed   By: BPrudencio PairM.D.   On: 11/12/2019 16:28   DG Chest Port 1 View  Result Date: 11/13/2019 CLINICAL DATA:  86year old female with history of left-sided pneumothorax. EXAM: PORTABLE CHEST 1 VIEW COMPARISON:  Chest x-ray  11/12/2019. FINDINGS: Lung volumes are low. Opacity at the left base which may reflect atelectasis and/or  consolidation with superimposed small to moderate left pleural effusion. Trace right pleural effusion. Trace left apical pneumothorax. No evidence of pulmonary edema. Heart size is mildly enlarged. Upper mediastinal contours are distorted by patient's position. Atherosclerotic calcifications in the thoracic aorta. Post vertebroplasty changes noted in the mid to lower thoracic spine. Left-sided pacemaker device in place with lead tips projecting over the expected location of the right atrium and right ventricle. IMPRESSION: 1. Persistent left-sided hydropneumothorax with very small left apical pneumothorax component and small to moderate left pleural effusion. 2. Trace right pleural effusion. 3. Mild cardiomegaly. 4. Aortic atherosclerosis. Electronically Signed   By: Vinnie Langton M.D.   On: 11/13/2019 07:12   DG Chest Port 1 View  Result Date: 11/12/2019 CLINICAL DATA:  86 year old female with syncopal episode today. COVID-19 status pending. EXAM: PORTABLE CHEST 1 VIEW COMPARISON:  Portable chest 09/19/2019 and earlier. FINDINGS: Portable AP upright view at 1300 hours. Larger lung volumes but increased left lung base opacity which obscures the left hemidiaphragm today. Stable cardiomegaly and mediastinal contours. Stable left chest pacemaker. The right lung appears clear. No pneumothorax. Chronic 2 level thoracic vertebral augmentation. No acute osseous abnormality identified. IMPRESSION: 1. Larger lung volumes but new confluent left lung base opacity. Differential considerations include left pleural effusion, pneumonia, aspiration. 2. Right lung is clear.  Stable cardiomegaly. Electronically Signed   By: Genevie Ann M.D.   On: 11/12/2019 13:23     Assessment & Plan:  Pamela [name] is a 86 y.o. female with a small apical pneumothorax on CXR and CT. She has scarring in the apex and some bleb disease. Potentially this is from bleb spontaneously rupturing. Her thickened pleura on the left is the same as  prior CT chest, and potentially is keeping the lung up and preventing the pneumothorax from worsening.  A chest tube is not indicated at this time and would be dangerous given her age and frailty and visible scarring. We could make the lung diease and pneumothorax much worse.   -Monitor for changes in desaturations or increased pain or SOB, stat CXR if this occurs  -Repeat CXR in the AM  -If stable over this period of time then would not expect it to change.  -Will follow   All questions were answered to the satisfaction of the patient.   Discussed with Dr. Wynetta Emery.   Virl Cagey 11/13/2019, 9:45 AM

## 2019-11-14 ENCOUNTER — Observation Stay (HOSPITAL_COMMUNITY): Payer: Medicare Other

## 2019-11-14 DIAGNOSIS — I10 Essential (primary) hypertension: Secondary | ICD-10-CM | POA: Diagnosis not present

## 2019-11-14 DIAGNOSIS — J939 Pneumothorax, unspecified: Secondary | ICD-10-CM | POA: Diagnosis not present

## 2019-11-14 DIAGNOSIS — Z7189 Other specified counseling: Secondary | ICD-10-CM | POA: Diagnosis not present

## 2019-11-14 DIAGNOSIS — I495 Sick sinus syndrome: Secondary | ICD-10-CM | POA: Diagnosis not present

## 2019-11-14 DIAGNOSIS — J90 Pleural effusion, not elsewhere classified: Secondary | ICD-10-CM | POA: Diagnosis not present

## 2019-11-14 DIAGNOSIS — R55 Syncope and collapse: Secondary | ICD-10-CM | POA: Diagnosis not present

## 2019-11-14 LAB — GLUCOSE, CAPILLARY
Glucose-Capillary: 87 mg/dL (ref 70–99)
Glucose-Capillary: 168 mg/dL — ABNORMAL HIGH (ref 70–99)

## 2019-11-14 LAB — COMPREHENSIVE METABOLIC PANEL
ALT: 13 U/L (ref 0–44)
AST: 15 U/L (ref 15–41)
Albumin: 3.2 g/dL — ABNORMAL LOW (ref 3.5–5.0)
Alkaline Phosphatase: 53 U/L (ref 38–126)
Anion gap: 9 (ref 5–15)
BUN: 18 mg/dL (ref 8–23)
CO2: 27 mmol/L (ref 22–32)
Calcium: 8.9 mg/dL (ref 8.9–10.3)
Chloride: 102 mmol/L (ref 98–111)
Creatinine, Ser: 0.72 mg/dL (ref 0.44–1.00)
GFR calc Af Amer: >60 mL/min (ref >60)
GFR calc non Af Amer: >60 mL/min (ref >60)
Glucose, Bld: 97 mg/dL (ref 70–99)
Potassium: 3.5 mmol/L (ref 3.5–5.1)
Sodium: 138 mmol/L (ref 135–145)
Total Bilirubin: 0.5 mg/dL (ref 0.3–1.2)
Total Protein: 5.5 g/dL — ABNORMAL LOW (ref 6.5–8.1)

## 2019-11-14 LAB — CBC
HCT: 33.7 % — ABNORMAL LOW (ref 36.0–46.0)
Hemoglobin: 10.2 g/dL — ABNORMAL LOW (ref 12.0–15.0)
MCH: 27.7 pg (ref 26.0–34.0)
MCHC: 30.3 g/dL (ref 30.0–36.0)
MCV: 91.6 fL (ref 80.0–100.0)
Platelets: 147 10*3/uL — ABNORMAL LOW (ref 150–400)
RBC: 3.68 MIL/uL — ABNORMAL LOW (ref 3.87–5.11)
RDW: 16.2 % — ABNORMAL HIGH (ref 11.5–15.5)
WBC: 4.6 10*3/uL (ref 4.0–10.5)
nRBC: 0 % (ref 0.0–0.2)

## 2019-11-14 LAB — MAGNESIUM: Magnesium: 1.8 mg/dL (ref 1.7–2.4)

## 2019-11-14 NOTE — Discharge Summary (Addendum)
Physician Discharge Summary  [name]:[id number] DOB: August 13, 1920 DOA: 11/12/2019  PCP: Elfredia Nevins, MD  Admit date: 11/12/2019 Discharge date: 11/14/2019  Admitted From:  [name]  Disposition:   [place]   Recommendations for Outpatient Follow-up:  1. Follow up outpatient as scheduled 2. TED Hoses to legs recommended.   Discharge Condition: STABLE   CODE STATUS: FULL    Brief Hospitalization Summary: Please see all hospital notes, images, labs for full details of the hospitalization. ADMISSION HPI:  Pamela [name]  is a 86 y.o. female, with a past medical history significant for tachybradycardia syndrome status post pacemaker, previous left hip replacement, hypertension, hyperlipidemia, coronary artery disease, who presented to ED secondary to syncope, still obtained from family at bedside, and ED staff, as patient having dementia, patient was at the hair salon at [facility], getting her hair done, does not remember much else, apparently patient had loss of consciousness, she was unresponsive for few minutes, she had no fall, but she was laid down to the floor by staff, she did not hurt her head or fell, overall patient had increased falls over the last couple weeks, patient is poor historian, but she denies any dyspnea, chest pain, fever, chills or cough. - in ED work-up was significant for elevated D-dimers, but CTA chest with no evidence of PE, but it was significant for the pleural effusion and new left apical pneumothorax, general surgery recommended observation-pneumothorax, she denies any dyspnea, she has no hypoxia, pacemaker was interrogated in ED, with no evidence of arrhythmias around syncope time (11 AM), but it was noted for ventricular tachycardia around 4 AM, rate hospitalist were consulted to admit.  Brief Admission Hx: 86 y.o.female,with a past medical history significant for tachybradycardia syndrome status post pacemaker, previous left hip replacement,  hypertension, hyperlipidemia, coronary artery disease, who presented to ED secondary to syncope  MDM/Assessment & Plan:   1. Syncope - unprovoked, no clear etiology found, pacemaker was interrogated and no specific arrhythmia determined at time of syncopal episode, no recurrences. Echo with no significant findings.  PT eval recommending HHPT.  2. Pneumothorax - RESOLVED by today's chest xray.  Discussed with surgery repeated CXR today showed resolution.     3. Elevated D dimer - CTA chest neg for PE, venous dopplers with findings of chronic nonocclusive DVT of left distal femoral vein discussed with patient she is not having any symptoms of post thrombotic syndrome and does not want to take the risk of taking any blood thinners at her advanced age and fall risk. TED hoses recommended.   4. Dementia - resumed home meds.  Palliative medicine consult for GOC.   5. HTN - resumed all home meds.   DVT prophylaxis: subcut heparin  Code Status: Full  Family Communication: unable to reach  Disposition Plan: return to [facility] today   Consultants:  Surgery    Discharge Diagnoses:  Active Problems:   SSS (sick sinus syndrome) with PAT and marked bradycardia with 3 sec pauses    Essential hypertension   Cardiac dysrhythmia   Syncope   Pneumothorax, left   Dementia without behavioral disturbance [facility])   Palliative care by specialist   Advanced care planning/counseling discussion   Goals of care, counseling/discussion   Discharge Instructions:  Allergies as of 11/14/2019      Reactions   Gabapentin Other (See Comments)   Blurred vision, dizziness      Medication List    TAKE these medications   acetaminophen 500 MG tablet Commonly known as:  TYLENOL Take 500 mg by mouth every 6 (six) hours as needed for mild pain or moderate pain.   aspirin EC 81 MG tablet Take 81 mg by mouth every morning.   calcium-vitamin D 500-200 MG-UNIT tablet Commonly known as: OSCAL WITH D Take 1  tablet by mouth daily.   carvedilol 12.5 MG tablet Commonly known as: COREG TAKE ONE TABLET (12.5MG  TOTAL) BY MOUTH TWO TIMES DAILY What changed: See the new instructions.   docusate sodium 100 MG capsule Commonly known as: Colace Take 1 capsule (100 mg total) by mouth 2 (two) times daily.   donepezil 10 MG tablet Commonly known as: ARICEPT TAKE ONE TABLET BY MOUTH EVERY NIGHT AT BEDTIME   folic acid 1 MG tablet Commonly known as: FOLVITE Take 1 mg by mouth daily.   hydrALAZINE 25 MG tablet Commonly known as: APRESOLINE TAKE ONE TABLET BY MOUTH IN THE MORNING,TWO TABLETS AT NOON, AND ONE TABLET IN THE EVENING What changed: See the new instructions.   memantine 10 MG tablet Commonly known as: NAMENDA TAKE ONE TABLET (10MG  TOTAL) BY MOUTH TWO TIMES DAILY What changed: See the new instructions.   OCUVITE PO Take 1 tablet by mouth 2 (two) times daily.   ramipril 5 MG capsule Commonly known as: ALTACE TAKE ONE CAPSULE (5MG  TOTAL) BY MOUTH DAILY What changed: See the new instructions.   sertraline 25 MG tablet Commonly known as: ZOLOFT Take 25 mg by mouth daily.   VITAMIN D-3 PO Take 2,000 Units by mouth daily.      Follow-up Information    [facility] Follow up.   Why:  PT         Allergies  Allergen Reactions  . Gabapentin Other (See Comments)    Blurred vision, dizziness    Allergies as of 11/14/2019      Reactions   Gabapentin Other (See Comments)   Blurred vision, dizziness      Medication List    TAKE these medications   acetaminophen 500 MG tablet Commonly known as: TYLENOL Take 500 mg by mouth every 6 (six) hours as needed for mild pain or moderate pain.   aspirin EC 81 MG tablet Take 81 mg by mouth every morning.   calcium-vitamin D 500-200 MG-UNIT tablet Commonly known as: OSCAL WITH D Take 1 tablet by mouth daily.   carvedilol 12.5 MG tablet Commonly known as: COREG TAKE ONE TABLET (12.5MG  TOTAL) BY MOUTH TWO TIMES  DAILY What changed: See the new instructions.   docusate sodium 100 MG capsule Commonly known as: Colace Take 1 capsule (100 mg total) by mouth 2 (two) times daily.   donepezil 10 MG tablet Commonly known as: ARICEPT TAKE ONE TABLET BY MOUTH EVERY NIGHT AT BEDTIME   folic acid 1 MG tablet Commonly known as: FOLVITE Take 1 mg by mouth daily.   hydrALAZINE 25 MG tablet Commonly known as: APRESOLINE TAKE ONE TABLET BY MOUTH IN THE MORNING,TWO TABLETS AT NOON, AND ONE TABLET IN THE EVENING What changed: See the new instructions.   memantine 10 MG tablet Commonly known as: NAMENDA TAKE ONE TABLET (10MG  TOTAL) BY MOUTH TWO TIMES DAILY What changed: See the new instructions.   OCUVITE PO Take 1 tablet by mouth 2 (two) times daily.   ramipril 5 MG capsule Commonly known as: ALTACE TAKE ONE CAPSULE (5MG  TOTAL) BY MOUTH DAILY What changed: See the new instructions.   sertraline 25 MG tablet Commonly known as: ZOLOFT Take 25 mg by mouth daily.  VITAMIN D-3 PO Take 2,000 Units by mouth daily.       Procedures/Studies: CT Head Wo Contrast  Result Date: 11/12/2019 CLINICAL DATA:  Ataxia, stroke suspected. Additional history provided: Syncopal episode. EXAM: CT HEAD WITHOUT CONTRAST TECHNIQUE: Contiguous axial images were obtained from the base of the skull through the vertex without intravenous contrast. COMPARISON:  Head CT 04/21/2017 FINDINGS: Brain: There is no evidence of acute intracranial hemorrhage, intracranial mass, midline shift or extra-axial fluid collection.No demarcated cortical infarction. Moderate patchy and confluent hypodensity within the cerebral white matter is nonspecific, but consistent with chronic small vessel ischemic disease. Findings are similar to prior head CT 04/21/2017. Stable, mild generalized parenchymal atrophy. Vascular: No hyperdense vessel.  Atherosclerotic calcifications. Skull: Normal. Negative for fracture or focal lesion. Sinuses/Orbits:  Visualized orbits demonstrate no acute abnormality. Mild ethmoid sinus mucosal thickening. No significant mastoid effusion. IMPRESSION: No evidence of acute intracranial abnormality. Stable generalized parenchymal atrophy and chronic small vessel ischemic disease as compared to head CT 04/21/2017. Mild ethmoid sinus mucosal thickening. Electronically Signed   By: Kellie Simmering DO   On: 11/12/2019 13:43   CT Angio Chest PE W and/or Wo Contrast  Result Date: 11/12/2019 CLINICAL DATA:  Syncopal episodes EXAM: CT ANGIOGRAPHY CHEST WITH CONTRAST TECHNIQUE: Multidetector CT imaging of the chest was performed using the standard protocol during bolus administration of intravenous contrast. Multiplanar CT image reconstructions and MIPs were obtained to evaluate the vascular anatomy. CONTRAST:  62mL OMNIPAQUE IOHEXOL 350 MG/ML SOLN COMPARISON:  Radiograph same day, CT September 26, 2016 FINDINGS: Cardiovascular: There is a optimal opacification of the pulmonary arteries. There is no central,segmental, or subsegmental filling defects within the pulmonary arteries. There is mild cardiomegaly. No pericardial effusion or thickening. No evidence right heart strain. There is normal three-vessel brachiocephalic anatomy without proximal stenosis. The thoracic aorta is normal in appearance. Scattered aortic atherosclerosis is noted. Mediastinum/Nodes: No hilar, mediastinal, or axillary adenopathy. Thyroid gland, trachea, and esophagus demonstrate no significant findings. Lungs/Pleura: A small left apical pneumothorax is seen. Small bilateral pleural effusions are present, left greater than right. There is mild interlobular septal thickening seen at both lung bases. Patchy atelectasis seen adjacent to the pleural effusions. There is scarring with small calcifications seen at the left lung apex. Bleb formation seen within the anterior left upper lobe. Upper Abdomen: No acute abnormalities present in the visualized portions of the  upper abdomen. Musculoskeletal: New since the prior exam is advanced compression deformity of the T6 vertebral body with 80% loss in vertebral body height. There is also slight superior compression deformity of the T7 vertebral body with less than 25% loss in height. Inferior endplate compression deformity of the T11 vertebral body is noted with air between the disc space and less than 25% loss in height. There is been prior cement fixation of the T8 and T9 vertebral bodies. Review of the MIP images confirms the above findings. IMPRESSION: 1. Small left apical pneumothorax. 2. No central, segmental, or subsegmental pulmonary embolism. 3. Age indeterminate, however new since prior exam, compression deformities of the T6, T7, and T11 vertebral bodies as described above. No retropulsion of fragments. 4. Small bilateral pleural effusions, left greater than right with adjacent basilar atelectasis. 5. These results were called by telephone at the time of interpretation on 11/12/2019 at 4:22 pm to provider Dr. Rogene Houston, who verbally acknowledged these results. Electronically Signed   By: Prudencio Pair M.D.   On: 11/12/2019 16:28   US Venous Img Lower Bilateral (DVT)  Result Date: 11/13/2019 CLINICAL DATA:  86 year old female with a history of right leg swelling EXAM: BILATERAL LOWER EXTREMITY VENOUS DOPPLER ULTRASOUND TECHNIQUE: Gray-scale sonography with graded compression, as well as color Doppler and duplex ultrasound were performed to evaluate the lower extremity deep venous systems from the level of the common femoral vein and including the common femoral, femoral, profunda femoral, popliteal and calf veins including the posterior tibial, peroneal and gastrocnemius veins when visible. The superficial great saphenous vein was also interrogated. Spectral Doppler was utilized to evaluate flow at rest and with distal augmentation maneuvers in the common femoral, femoral and popliteal veins. COMPARISON:  None. FINDINGS:  RIGHT LOWER EXTREMITY Common Femoral Vein: No evidence of thrombus. Normal compressibility, respiratory phasicity and response to augmentation. Saphenofemoral Junction: No evidence of thrombus. Normal compressibility and flow on color Doppler imaging. Profunda Femoral Vein: No evidence of thrombus. Normal compressibility and flow on color Doppler imaging. Femoral Vein: No evidence of thrombus. Normal compressibility, respiratory phasicity and response to augmentation. Popliteal Vein: No evidence of thrombus. Normal compressibility, respiratory phasicity and response to augmentation. Calf Veins: No evidence of thrombus. Normal compressibility and flow on color Doppler imaging. Superficial Great Saphenous Vein: No evidence of thrombus. Normal compressibility and flow on color Doppler imaging. Other Findings:  None. LEFT LOWER EXTREMITY Common Femoral Vein: No evidence of thrombus. Normal compressibility, respiratory phasicity and response to augmentation. Saphenofemoral Junction: No evidence of thrombus. Normal compressibility and flow on color Doppler imaging. Profunda Femoral Vein: No evidence of thrombus. Normal compressibility and flow on color Doppler imaging. Femoral Vein: Incomplete compressibility of the left femoral vein in the distal thigh, nonocclusive with wall thickening. Popliteal Vein: No evidence of thrombus. Normal compressibility, respiratory phasicity and response to augmentation. Calf Veins: No evidence of thrombus. Normal compressibility and flow on color Doppler imaging. Superficial Great Saphenous Vein: No evidence of thrombus. Normal compressibility and flow on color Doppler imaging. Other Findings:  None. IMPRESSION: Sonographic survey of the right lower extremity negative for DVT. Sonographic survey of the left lower extremity demonstrates chronic nonocclusive DVT of the distal femoral vein. Electronically Signed   By: Gilmer MorJaime  Wagner D.O.   On: 11/13/2019 12:03   DG CHEST PORT 1  VIEW  Result Date: 11/14/2019 CLINICAL DATA:  Follow-up pneumothorax EXAM: PORTABLE CHEST 1 VIEW COMPARISON:  Yesterday FINDINGS: The left apical pneumothorax is no longer seen. Layering in the left pleural effusion which could reach the apex. The right lung is clear. Cardiomegaly. Dual-chamber pacer leads from the left. IMPRESSION: The left apical pneumothorax is no longer seen. Layering left pleural effusion which likely reaches the apex. Electronically Signed   By: Marnee SpringJonathon  [name] M.D.   On: 11/14/2019 06:14   DG Chest Port 1 View  Result Date: 11/13/2019 CLINICAL DATA:  86 year old female with history of left-sided pneumothorax. EXAM: PORTABLE CHEST 1 VIEW COMPARISON:  Chest x-ray 11/12/2019. FINDINGS: Lung volumes are low. Opacity at the left base which may reflect atelectasis and/or consolidation with superimposed small to moderate left pleural effusion. Trace right pleural effusion. Trace left apical pneumothorax. No evidence of pulmonary edema. Heart size is mildly enlarged. Upper mediastinal contours are distorted by patient's position. Atherosclerotic calcifications in the thoracic aorta. Post vertebroplasty changes noted in the mid to lower thoracic spine. Left-sided pacemaker device in place with lead tips projecting over the expected location of the right atrium and right ventricle. IMPRESSION: 1. Persistent left-sided hydropneumothorax with very small left apical pneumothorax component and small to moderate left pleural effusion. 2.  Trace right pleural effusion. 3. Mild cardiomegaly. 4. Aortic atherosclerosis. Electronically Signed   By: Trudie Reed M.D.   On: 11/13/2019 07:12   DG Chest Port 1 View  Result Date: 11/12/2019 CLINICAL DATA:  86 year old female with syncopal episode today. COVID-19 status pending. EXAM: PORTABLE CHEST 1 VIEW COMPARISON:  Portable chest 09/19/2019 and earlier. FINDINGS: Portable AP upright view at 1300 hours. Larger lung volumes but increased left lung base  opacity which obscures the left hemidiaphragm today. Stable cardiomegaly and mediastinal contours. Stable left chest pacemaker. The right lung appears clear. No pneumothorax. Chronic 2 level thoracic vertebral augmentation. No acute osseous abnormality identified. IMPRESSION: 1. Larger lung volumes but new confluent left lung base opacity. Differential considerations include left pleural effusion, pneumonia, aspiration. 2. Right lung is clear.  Stable cardiomegaly. Electronically Signed   By: Odessa Fleming M.D.   On: 11/12/2019 13:23   ECHOCARDIOGRAM COMPLETE  Result Date: 11/13/2019    ECHOCARDIOGRAM REPORT   Patient Name:   LATIQUA DALOIA Date of Exam: 11/13/2019 Medical Rec #:  [id number]        Height:       65.0 in Accession #:    [id number]       Weight:       151.2 lb Date of Birth:  [birth year]        BSA:          1.757 m Patient Age:    86 years         BP:           147/61 mmHg Patient Gender: F                HR:           79 bpm. [facility] Procedure: 2D Echo Indications:    Syncope 780.2 / R55  History:        Patient has prior history of Echocardiogram examinations, most                 recent 04/09/2013. Signs/Symptoms:Syncope; Risk                 Factors:Hypertension, Dyslipidemia and Non-Smoker. SSS (sick                 sinus syndrome) with PAT and marked bradycardia with 3 sec                 pauses , Chronic ischemic heart disease, SVT (supraventricular                 tachycardia.  Sonographer:    Jeryl Columbia RDCS (AE) Referring Phys: [id number] [address] IMPRESSIONS  1. Left ventricular ejection fraction, by estimation, is 60 to 65%. The left ventricle has normal function. The left ventricle has no regional wall motion abnormalities. There is mild left ventricular hypertrophy. Left ventricular diastolic parameters were normal.  2. Right ventricular systolic function is normal. The right ventricular size is normal. There is mildly elevated pulmonary artery systolic pressure.  The estimated right ventricular systolic pressure is 32.4 mmHg.  3. The mitral valve is grossly normal. Trivial mitral valve regurgitation.  4. The aortic valve is tricuspid. Aortic valve regurgitation is mild. Mild aortic valve sclerosis is present, with no evidence of aortic valve stenosis.  5. The inferior vena cava is normal in size with greater than 50% respiratory variability, suggesting right atrial pressure of 3 mmHg. FINDINGS  Left Ventricle: Left ventricular ejection fraction,  by estimation, is 60 to 65%. The left ventricle has normal function. The left ventricle has no regional wall motion abnormalities. The left ventricular internal cavity size was normal in size. There is  mild left ventricular hypertrophy. Left ventricular diastolic parameters were normal. Right Ventricle: The right ventricular size is normal. No increase in right ventricular wall thickness. Right ventricular systolic function is normal. There is mildly elevated pulmonary artery systolic pressure. The tricuspid regurgitant velocity is 2.71  m/s, and with an assumed right atrial pressure of 3 mmHg, the estimated right ventricular systolic pressure is 32.4 mmHg. Left Atrium: Left atrial size was normal in size. Right Atrium: Right atrial size was normal in size. Pericardium: There is no evidence of pericardial effusion. Presence of pericardial fat pad. Mitral Valve: The mitral valve is grossly normal. Trivial mitral valve regurgitation. Tricuspid Valve: The tricuspid valve is grossly normal. Tricuspid valve regurgitation is trivial. Aortic Valve: The aortic valve is tricuspid. Aortic valve regurgitation is mild. Mild aortic valve sclerosis is present, with no evidence of aortic valve stenosis. Pulmonic Valve: The pulmonic valve was grossly normal. Pulmonic valve regurgitation is trivial. Aorta: The aortic root is normal in size and structure. Venous: The inferior vena cava is normal in size with greater than 50% respiratory variability,  suggesting right atrial pressure of 3 mmHg. IAS/Shunts: No atrial level shunt detected by color flow Doppler. Additional Comments: A pacer wire is visualized.  LEFT VENTRICLE PLAX 2D LVIDd:         4.30 cm  Diastology LVIDs:         2.57 cm  LV e' lateral:   7.18 cm/s LV PW:         1.12 cm  LV E/e' lateral: 9.6 LV IVS:        1.19 cm  LV e' medial:    6.31 cm/s LVOT diam:     2.10 cm  LV E/e' medial:  10.9 LVOT Area:     3.46 cm  RIGHT VENTRICLE TAPSE (M-mode): 2.0 cm LEFT ATRIUM             Index LA diam:        3.80 cm 2.16 cm/m LA Vol (A2C):   72.3 ml 41.16 ml/m LA Vol (A4C):   48.7 ml 27.72 ml/m LA Biplane Vol: 60.9 ml 34.67 ml/m   AORTA Ao Root diam: 3.20 cm MITRAL VALVE               TRICUSPID VALVE MV Area (PHT): 2.41 cm    TR Peak grad:   29.4 mmHg MV Decel Time: 315 msec    TR Vmax:        271.00 cm/s MV E velocity: 69.00 cm/s MV A velocity: 83.60 cm/s  SHUNTS MV E/A ratio:  0.83        Systemic Diam: 2.10 cm Nona Dell MD Electronically signed by Nona Dell MD Signature Date/Time: 11/13/2019/10:33:35 AM    Final       Subjective: Pt reports feeling much better.  No SOB or chest pain.    Discharge Exam: Vitals:   11/14/19 0851 11/14/19 1223  BP: (!) 143/72 140/70  Pulse: 77 76  Resp: 19 19  Temp: 98.5 F (36.9 C) 98.6 F (37 C)  SpO2: 96% 95%   Vitals:   11/14/19 0151 11/14/19 0521 11/14/19 0851 11/14/19 1223  BP:  (!) 159/74 (!) 143/72 140/70  Pulse:  78 77 76  Resp:  16 19 19   Temp:  98.7 F (  37.1 C) 98.5 F (36.9 C) 98.6 F (37 C)  TempSrc:  Oral Oral Oral  SpO2:  95% 96% 95%  Weight: 69 kg     Height:       General: Pt is alert, awake, not in acute distress Cardiovascular: RRR, S1/S2 +, no rubs, no gallops Respiratory: CTA bilaterally, no wheezing, no rhonchi Abdominal: Soft, NT, ND, bowel sounds + Extremities: no edema, no cyanosis   The results of significant diagnostics from this hospitalization (including imaging, microbiology, ancillary and  laboratory) are listed below for reference.     Microbiology: No results found for this or any previous visit (from the past 240 hour(s)).   Labs: BNP (last 3 results) Recent Labs    11/12/19 1300  BNP 195.0*   Basic Metabolic Panel: Recent Labs  Lab 11/12/19 1259 11/13/19 0505 11/14/19 0632  NA 137 138 138  K 3.8 3.8 3.5  CL 101 102 102  CO2 27 27 27   GLUCOSE 127* 94 97  BUN 23 25* 18  CREATININE 1.09* 1.03* 0.72  CALCIUM 9.7 9.1 8.9  MG  --  1.8 1.8   Liver Function Tests: Recent Labs  Lab 11/12/19 1259 11/14/19 0632  AST 23 15  ALT 16 13  ALKPHOS 59 53  BILITOT 0.8 0.5  PROT 5.8* 5.5*  ALBUMIN 3.5 3.2*   No results for input(s): LIPASE, AMYLASE in the last 168 hours. No results for input(s): AMMONIA in the last 168 hours. CBC: Recent Labs  Lab 11/12/19 1259 11/13/19 0505 11/14/19 0632  WBC 4.7 4.1 4.6  NEUTROABS 3.0  --   --   HGB 10.5* 10.3* 10.2*  HCT 34.2* 33.8* 33.7*  MCV 91.2 92.6 91.6  PLT 143* 153 147*   Cardiac Enzymes: No results for input(s): CKTOTAL, CKMB, CKMBINDEX, TROPONINI in the last 168 hours. BNP: Invalid input(s): POCBNP CBG: Recent Labs  Lab 11/13/19 1121 11/13/19 1633 11/13/19 2050 11/14/19 0826 11/14/19 1231  GLUCAP 110* 92 136* 87 168*   D-Dimer Recent Labs    11/12/19 1259  DDIMER 6.58*   Hgb A1c No results for input(s): HGBA1C in the last 72 hours. Lipid Profile No results for input(s): CHOL, HDL, LDLCALC, TRIG, CHOLHDL, LDLDIRECT in the last 72 hours. Thyroid function studies No results for input(s): TSH, T4TOTAL, T3FREE, THYROIDAB in the last 72 hours.  Invalid input(s): FREET3 Anemia work up No results for input(s): VITAMINB12, FOLATE, FERRITIN, TIBC, IRON, RETICCTPCT in the last 72 hours. Urinalysis    Component Value Date/Time   COLORURINE YELLOW 11/12/2019 1258   APPEARANCEUR CLEAR 11/12/2019 1258   LABSPEC >1.046 (H) 11/12/2019 1258   PHURINE 5.0 11/12/2019 1258   GLUCOSEU NEGATIVE  11/12/2019 1258   HGBUR NEGATIVE 11/12/2019 1258   BILIRUBINUR NEGATIVE 11/12/2019 1258   KETONESUR NEGATIVE 11/12/2019 1258   PROTEINUR NEGATIVE 11/12/2019 1258   UROBILINOGEN 0.2 02/10/2015 1104   NITRITE NEGATIVE 11/12/2019 1258   LEUKOCYTESUR NEGATIVE 11/12/2019 1258   Sepsis Labs Invalid input(s): PROCALCITONIN,  WBC,  LACTICIDVEN Microbiology No results found for this or any previous visit (from the past 240 hour(s)).  Time coordinating discharge: 33 mins  SIGNED:  11/14/2019, MD  [facility] 11/14/2019, 1:29 PM How to contact the [facility] Attending or Consulting provider 7A - 7P or covering provider during after hours 7P -7A, for this patient?  1. Check the care team in [facility] and look for a) attending/consulting [facility] provider listed and b) the [facility] team listed 2. Log into [email or web address] and use [facility]'s universal password to access. If you do not have the password, please contact the hospital operator. 3. Locate the [facility] provider you are looking for under [facility] and page to a number that you can be directly reached. 4. If you still have difficulty reaching the provider, please page the [facility] (Director on Call) for the Hospitalists listed on amion for assistance.

## 2019-11-14 NOTE — Plan of Care (Signed)

## 2019-11-14 NOTE — NC FL2 (Signed)
[place] LEVEL OF CARE SCREENING TOOL     IDENTIFICATION  Patient Name: Pamela [name] Birthdate: 11-Dec-1920 Sex: female Admission Date (Current Location): 11/12/2019  [facility] [place] and [place] Number:  [employer] and Address:  [facility] [address] [place]      Provider Number: [id number]  Attending Physician Name and Address:  Murlean Iba, MD  Relative Name and Phone Number:  [name] - son [phone]    Current Level of Care: Hospital Recommended Level of Care: [place] Prior Approval Number:    Date Approved/Denied:   PASRR Number: [id number]  Discharge Plan: Domiciliary (Rest home)    Current Diagnoses: Patient Active Problem List   Diagnosis Date Noted  . Pneumothorax, left 11/13/2019  . Dementia without behavioral disturbance ([place])   . Palliative care by specialist   . Advanced care planning/counseling discussion   . Goals of care, counseling/discussion   . Syncope 11/12/2019  . Other pancytopenia ([place]) 06/26/2017  . Gait disorder 03/05/2017  . Urinary frequency 08/10/2015  . Postoperative anemia due to acute blood loss 07/27/2015  . SVT (supraventricular tachycardia) ([place]) 07/26/2015  . Femur fracture, left ([place]) 07/24/2015  . Chronic kidney disease 07/24/2015  . Periprosthetic fracture around internal prosthetic left hip joint ([place]) 07/24/2015  . Fall at home   . DVT (deep venous thrombosis) ([place]) 01/30/2014  . Occult blood positive stool 01/30/2014  . Thrombocytopenia, unspecified ([place]) 01/06/2014  . Anemia, iron deficiency 01/06/2014  . Hip fracture, left ([place]) 01/03/2014  . QT prolongation 01/03/2014  . Atherosclerotic cardiovascular disease 08/13/2013  . Hypertensive emergency 04/08/2013  . Dizzy 04/08/2013  . PAT (paroxysmal atrial tachycardia) ([place]) 12/25/2012  . SSS (sick sinus syndrome) with PAT and marked bradycardia with 3 sec pauses  08/30/2012  . Status  post placement of cardiac pacemaker. 08/29/12 St. Jude device 08/30/2012  . HTN (hypertension) 08/30/2012  . Hyperlipidemia 08/30/2012  . Osteoporosis, unspecified 04/19/2012  . Essential and other specified forms of tremor 04/19/2012  . Essential hypertension 04/19/2012  . Cardiac dysrhythmia 04/19/2012  . Chronic ischemic heart disease 04/19/2012  . Hereditary and idiopathic peripheral neuropathy 04/19/2012  . Memory loss 04/19/2012  . Disturbance of skin sensation 04/19/2012  . Pain in joint, shoulder region 11/20/2011  . Status post complete repair of rotator cuff 11/20/2011  . Muscle weakness (generalized) 11/20/2011    Orientation RESPIRATION BLADDER Height & Weight     Self, Situation, Place  Normal Continent Weight: 152 lb 1.9 oz (69 kg) Height:  5\' 5"  (165.1 cm)  BEHAVIORAL SYMPTOMS/MOOD NEUROLOGICAL BOWEL NUTRITION STATUS      Continent Diet(Heart Healthy)  AMBULATORY STATUS COMMUNICATION OF NEEDS Skin   Extensive Assist Verbally Normal                       Personal Care Assistance Level of Assistance  Bathing, Feeding, Dressing Bathing Assistance: Maximum assistance Feeding assistance: Limited assistance Dressing Assistance: Maximum assistance     Functional Limitations Info  Sight, Hearing, Speech Sight Info: Adequate Hearing Info: Adequate Speech Info: Adequate    SPECIAL CARE FACTORS FREQUENCY  PT (By licensed PT)     PT Frequency: 5 times a week              Contractures Contractures Info: Not present    Additional Factors Info  Code Status, Allergies Code Status Info: Full Allergies Info: Gabapentin  Current Medications (11/14/2019):  This is the current hospital active medication list Current Facility-Administered Medications  Medication Dose Route Frequency Provider Last Rate Last Admin  . acetaminophen (TYLENOL) tablet 650 mg  650 mg Oral Q6H PRN Elgergawy, Leana Roe, MD       Or  . acetaminophen (TYLENOL) suppository  650 mg  650 mg Rectal Q6H PRN Elgergawy, Leana Roe, MD      . aspirin EC tablet 81 mg  81 mg Oral Daily Elgergawy, Leana Roe, MD   81 mg at 11/14/19 0859  . calcium-vitamin D (OSCAL WITH D) 500-200 MG-UNIT per tablet 1 tablet  1 tablet Oral Q breakfast Elgergawy, Leana Roe, MD   1 tablet at 11/14/19 0858  . carvedilol (COREG) tablet 12.5 mg  12.5 mg Oral BID WC Elgergawy, Leana Roe, MD   12.5 mg at 11/14/19 0858  . donepezil (ARICEPT) tablet 10 mg  10 mg Oral QHS Elgergawy, Leana Roe, MD   10 mg at 11/13/19 2100  . folic acid (FOLVITE) tablet 1 mg  1 mg Oral Daily Elgergawy, Leana Roe, MD   1 mg at 11/14/19 0858  . heparin injection 5,000 Units  5,000 Units Subcutaneous Q8H Elgergawy, Leana Roe, MD   5,000 Units at 11/14/19 0545  . hydrALAZINE (APRESOLINE) tablet 25 mg  25 mg Oral BID Laural Benes, Clanford L, MD   25 mg at 11/14/19 0858  . hydrALAZINE (APRESOLINE) tablet 50 mg  50 mg Oral Q1400 Johnson, Clanford L, MD   50 mg at 11/13/19 1418  . memantine (NAMENDA) tablet 10 mg  10 mg Oral BID Elgergawy, Leana Roe, MD   10 mg at 11/14/19 0858  . sertraline (ZOLOFT) tablet 25 mg  25 mg Oral Daily Johnson, Clanford L, MD   25 mg at 11/14/19 0858  . sodium chloride flush (NS) 0.9 % injection 3 mL  3 mL Intravenous Q12H Elgergawy, Leana Roe, MD   3 mL at 11/13/19 2158     Discharge Medications: Medication List    TAKE these medications   acetaminophen 500 MG tablet Commonly known as: TYLENOL Take 500 mg by mouth every 6 (six) hours as needed for mild pain or moderate pain.   aspirin EC 81 MG tablet Take 81 mg by mouth every morning.   calcium-vitamin D 500-200 MG-UNIT tablet Commonly known as: OSCAL WITH D Take 1 tablet by mouth daily.   carvedilol 12.5 MG tablet Commonly known as: COREG TAKE ONE TABLET (12.5MG  TOTAL) BY MOUTH TWO TIMES DAILY What changed: See the new instructions.   docusate sodium 100 MG capsule Commonly known as: Colace Take 1 capsule (100 mg total) by mouth 2 (two) times  daily.   donepezil 10 MG tablet Commonly known as: ARICEPT TAKE ONE TABLET BY MOUTH EVERY NIGHT AT BEDTIME   folic acid 1 MG tablet Commonly known as: FOLVITE Take 1 mg by mouth daily.   hydrALAZINE 25 MG tablet Commonly known as: APRESOLINE TAKE ONE TABLET BY MOUTH IN THE MORNING,TWO TABLETS AT NOON, AND ONE TABLET IN THE EVENING What changed: See the new instructions.   memantine 10 MG tablet Commonly known as: NAMENDA TAKE ONE TABLET (10MG  TOTAL) BY MOUTH TWO TIMES DAILY What changed: See the new instructions.   OCUVITE PO Take 1 tablet by mouth 2 (two) times daily.   ramipril 5 MG capsule Commonly known as: ALTACE TAKE ONE CAPSULE (5MG  TOTAL) BY MOUTH DAILY What changed: See the new instructions.   sertraline 25 MG tablet Commonly known  as: ZOLOFT Take 25 mg by mouth daily.   VITAMIN D-3 PO Take 2,000 Units by mouth daily.     Relevant Imaging Results:  Relevant Lab Results:   Additional Information SS# [id number]  Ewing Schlein, LCSW

## 2019-11-14 NOTE — Progress Notes (Signed)
[facility] Progress Note     Subjective: Looking well. No complaints. Feeling good. No SOB or chest pain.   Objective: Vital signs in last 24 hours: Temp:  [97.8 F (36.6 C)-98.7 F (37.1 C)] 98.5 F (36.9 C) (04/16 1405) Pulse Rate:  [75-84] 75 (04/16 1405) Resp:  [16-20] 18 (04/16 1405) BP: (132-159)/(68-82) 140/72 (04/16 1405) SpO2:  [92 %-97 %] 97 % (04/16 1405) Weight:  [69 kg] 69 kg (04/16 0151) Last BM Date: 11/13/19  Intake/Output from previous day: 04/15 0701 - 04/16 0700 In: 360 [P.O.:360] Out: 400 [Urine:400] Intake/Output this shift: Total I/O In: 480 [P.O.:480] Out: -   General appearance: alert, cooperative and no distress Resp: normal work of breathing  Lab Results:  Recent Labs    11/13/19 0505 11/14/19 0632  WBC 4.1 4.6  HGB 10.3* 10.2*  HCT 33.8* 33.7*  PLT 153 147*   BMET Recent Labs    11/13/19 0505 11/14/19 0632  NA 138 138  K 3.8 3.5  CL 102 102  CO2 27 27  GLUCOSE 94 97  BUN 25* 18  CREATININE 1.03* 0.72  CALCIUM 9.1 8.9   PT/INR No results for input(s): LABPROT, INR in the last 72 hours.  Personally reviewed today's CXR  - no obvious pneumothorax noted  Studies/Results: CT Angio Chest PE W and/or Wo Contrast  Result Date: 11/12/2019 CLINICAL DATA:  Syncopal episodes EXAM: CT ANGIOGRAPHY CHEST WITH CONTRAST TECHNIQUE: Multidetector CT imaging of the chest was performed using the standard protocol during bolus administration of intravenous contrast. Multiplanar CT image reconstructions and MIPs were obtained to evaluate the vascular anatomy. CONTRAST:  47mL OMNIPAQUE IOHEXOL 350 MG/ML SOLN COMPARISON:  Radiograph same day, CT September 26, 2016 FINDINGS: Cardiovascular: There is a optimal opacification of the pulmonary arteries. There is no central,segmental, or subsegmental filling defects within the pulmonary arteries. There is mild cardiomegaly. No pericardial effusion or thickening. No evidence right heart  strain. There is normal three-vessel brachiocephalic anatomy without proximal stenosis. The thoracic aorta is normal in appearance. Scattered aortic atherosclerosis is noted. Mediastinum/Nodes: No hilar, mediastinal, or axillary adenopathy. Thyroid gland, trachea, and esophagus demonstrate no significant findings. Lungs/Pleura: A small left apical pneumothorax is seen. Small bilateral pleural effusions are present, left greater than right. There is mild interlobular septal thickening seen at both lung bases. Patchy atelectasis seen adjacent to the pleural effusions. There is scarring with small calcifications seen at the left lung apex. Bleb formation seen within the anterior left upper lobe. Upper Abdomen: No acute abnormalities present in the visualized portions of the upper abdomen. Musculoskeletal: New since the prior exam is advanced compression deformity of the T6 vertebral body with 80% loss in vertebral body height. There is also slight superior compression deformity of the T7 vertebral body with less than 25% loss in height. Inferior endplate compression deformity of the T11 vertebral body is noted with air between the disc space and less than 25% loss in height. There is been prior cement fixation of the T8 and T9 vertebral bodies. Review of the MIP images confirms the above findings. IMPRESSION: 1. Small left apical pneumothorax. 2. No central, segmental, or subsegmental pulmonary embolism. 3. Age indeterminate, however new since prior exam, compression deformities of the T6, T7, and T11 vertebral bodies as described above. No retropulsion of fragments. 4. Small bilateral pleural effusions, left greater than right with adjacent basilar atelectasis. 5. These results were called by telephone at the time of interpretation on 11/12/2019 at 4:22 pm to  provider Dr. Rogene Houston, who verbally acknowledged these results. Electronically Signed   By: Prudencio Pair M.D.   On: 11/12/2019 16:28   US Venous Img Lower  Bilateral (DVT)  Result Date: 11/13/2019 CLINICAL DATA:  86 year old female with a history of right leg swelling EXAM: BILATERAL LOWER EXTREMITY VENOUS DOPPLER ULTRASOUND TECHNIQUE: Gray-scale sonography with graded compression, as well as color Doppler and duplex ultrasound were performed to evaluate the lower extremity deep venous systems from the level of the common femoral vein and including the common femoral, femoral, profunda femoral, popliteal and calf veins including the posterior tibial, peroneal and gastrocnemius veins when visible. The superficial great saphenous vein was also interrogated. Spectral Doppler was utilized to evaluate flow at rest and with distal augmentation maneuvers in the common femoral, femoral and popliteal veins. COMPARISON:  None. FINDINGS: RIGHT LOWER EXTREMITY Common Femoral Vein: No evidence of thrombus. Normal compressibility, respiratory phasicity and response to augmentation. Saphenofemoral Junction: No evidence of thrombus. Normal compressibility and flow on color Doppler imaging. Profunda Femoral Vein: No evidence of thrombus. Normal compressibility and flow on color Doppler imaging. Femoral Vein: No evidence of thrombus. Normal compressibility, respiratory phasicity and response to augmentation. Popliteal Vein: No evidence of thrombus. Normal compressibility, respiratory phasicity and response to augmentation. Calf Veins: No evidence of thrombus. Normal compressibility and flow on color Doppler imaging. Superficial Great Saphenous Vein: No evidence of thrombus. Normal compressibility and flow on color Doppler imaging. Other Findings:  None. LEFT LOWER EXTREMITY Common Femoral Vein: No evidence of thrombus. Normal compressibility, respiratory phasicity and response to augmentation. Saphenofemoral Junction: No evidence of thrombus. Normal compressibility and flow on color Doppler imaging. Profunda Femoral Vein: No evidence of thrombus. Normal compressibility and flow on  color Doppler imaging. Femoral Vein: Incomplete compressibility of the left femoral vein in the distal thigh, nonocclusive with wall thickening. Popliteal Vein: No evidence of thrombus. Normal compressibility, respiratory phasicity and response to augmentation. Calf Veins: No evidence of thrombus. Normal compressibility and flow on color Doppler imaging. Superficial Great Saphenous Vein: No evidence of thrombus. Normal compressibility and flow on color Doppler imaging. Other Findings:  None. IMPRESSION: Sonographic survey of the right lower extremity negative for DVT. Sonographic survey of the left lower extremity demonstrates chronic nonocclusive DVT of the distal femoral vein. Electronically Signed   By: Corrie Mckusick D.O.   On: 11/13/2019 12:03   DG CHEST PORT 1 VIEW  Result Date: 11/14/2019 CLINICAL DATA:  Follow-up pneumothorax EXAM: PORTABLE CHEST 1 VIEW COMPARISON:  Yesterday FINDINGS: The left apical pneumothorax is no longer seen. Layering in the left pleural effusion which could reach the apex. The right lung is clear. Cardiomegaly. Dual-chamber pacer leads from the left. IMPRESSION: The left apical pneumothorax is no longer seen. Layering left pleural effusion which likely reaches the apex. Electronically Signed   By: Monte Fantasia M.D.   On: 11/14/2019 06:14   DG Chest Port 1 View  Result Date: 11/13/2019 CLINICAL DATA:  86 year old female with history of left-sided pneumothorax. EXAM: PORTABLE CHEST 1 VIEW COMPARISON:  Chest x-ray 11/12/2019. FINDINGS: Lung volumes are low. Opacity at the left base which may reflect atelectasis and/or consolidation with superimposed small to moderate left pleural effusion. Trace right pleural effusion. Trace left apical pneumothorax. No evidence of pulmonary edema. Heart size is mildly enlarged. Upper mediastinal contours are distorted by patient's position. Atherosclerotic calcifications in the thoracic aorta. Post vertebroplasty changes noted in the mid to  lower thoracic spine. Left-sided pacemaker device in place with lead tips  projecting over the expected location of the right atrium and right ventricle. IMPRESSION: 1. Persistent left-sided hydropneumothorax with very small left apical pneumothorax component and small to moderate left pleural effusion. 2. Trace right pleural effusion. 3. Mild cardiomegaly. 4. Aortic atherosclerosis. Electronically Signed   By: Trudie Reed M.D.   On: 11/13/2019 07:12   ECHOCARDIOGRAM COMPLETE  Result Date: 11/13/2019    ECHOCARDIOGRAM REPORT   Patient Name:   Pamela [name] Date of Exam: 11/13/2019 Medical Rec #:  [id number]        Height:       65.0 in Accession #:    [id number]       Weight:       151.2 lb Date of Birth:  03-May-1921        BSA:          1.757 m Patient Age:    89 years         BP:           147/61 mmHg Patient Gender: F                HR:           79 bpm. [facility]am [facility] Procedure: 2D Echo Indications:    Syncope 780.2 / R55  History:        Patient has prior history of Echocardiogram examinations, most                 recent 04/09/2013. Signs/Symptoms:Syncope; Risk                 Factors:Hypertension, Dyslipidemia and Non-Smoker. SSS (sick                 sinus syndrome) with PAT and marked bradycardia with 3 sec                 pauses , Chronic ischemic heart disease, SVT (supraventricular                 tachycardia.  Sonographer:    Jeryl Columbia RDCS (AE) Referring Phys: [id number] [address] ELGERGAWY IMPRESSIONS  1. Left ventricular ejection fraction, by estimation, is 60 to 65%. The left ventricle has normal function. The left ventricle has no regional wall motion abnormalities. There is mild left ventricular hypertrophy. Left ventricular diastolic parameters were normal.  2. Right ventricular systolic function is normal. The right ventricular size is normal. There is mildly elevated pulmonary artery systolic pressure. The estimated right ventricular systolic pressure is 32.4 mmHg.  3. The  mitral valve is grossly normal. Trivial mitral valve regurgitation.  4. The aortic valve is tricuspid. Aortic valve regurgitation is mild. Mild aortic valve sclerosis is present, with no evidence of aortic valve stenosis.  5. The inferior vena cava is normal in size with greater than 50% respiratory variability, suggesting right atrial pressure of 3 mmHg. FINDINGS  Left Ventricle: Left ventricular ejection fraction, by estimation, is 60 to 65%. The left ventricle has normal function. The left ventricle has no regional wall motion abnormalities. The left ventricular internal cavity size was normal in size. There is  mild left ventricular hypertrophy. Left ventricular diastolic parameters were normal. Right Ventricle: The right ventricular size is normal. No increase in right ventricular wall thickness. Right ventricular systolic function is normal. There is mildly elevated pulmonary artery systolic pressure. The tricuspid regurgitant velocity is 2.71  m/s, and with an assumed right atrial pressure of 3 mmHg, the estimated right ventricular systolic  pressure is 32.4 mmHg. Left Atrium: Left atrial size was normal in size. Right Atrium: Right atrial size was normal in size. Pericardium: There is no evidence of pericardial effusion. Presence of pericardial fat pad. Mitral Valve: The mitral valve is grossly normal. Trivial mitral valve regurgitation. Tricuspid Valve: The tricuspid valve is grossly normal. Tricuspid valve regurgitation is trivial. Aortic Valve: The aortic valve is tricuspid. Aortic valve regurgitation is mild. Mild aortic valve sclerosis is present, with no evidence of aortic valve stenosis. Pulmonic Valve: The pulmonic valve was grossly normal. Pulmonic valve regurgitation is trivial. Aorta: The aortic root is normal in size and structure. Venous: The inferior vena cava is normal in size with greater than 50% respiratory variability, suggesting right atrial pressure of 3 mmHg. IAS/Shunts: No atrial level  shunt detected by color flow Doppler. Additional Comments: A pacer wire is visualized.  LEFT VENTRICLE PLAX 2D LVIDd:         4.30 cm  Diastology LVIDs:         2.57 cm  LV e' lateral:   7.18 cm/s LV PW:         1.12 cm  LV E/e' lateral: 9.6 LV IVS:        1.19 cm  LV e' medial:    6.31 cm/s LVOT diam:     2.10 cm  LV E/e' medial:  10.9 LVOT Area:     3.46 cm  RIGHT VENTRICLE TAPSE (M-mode): 2.0 cm LEFT ATRIUM             Index LA diam:        3.80 cm 2.16 cm/m LA Vol (A2C):   72.3 ml 41.16 ml/m LA Vol (A4C):   48.7 ml 27.72 ml/m LA Biplane Vol: 60.9 ml 34.67 ml/m   AORTA Ao Root diam: 3.20 cm MITRAL VALVE               TRICUSPID VALVE MV Area (PHT): 2.41 cm    TR Peak grad:   29.4 mmHg MV Decel Time: 315 msec    TR Vmax:        271.00 cm/s MV E velocity: 69.00 cm/s MV A velocity: 83.60 cm/s  SHUNTS MV E/A ratio:  0.83        Systemic Diam: 2.10 cm Nona Dell MD Electronically signed by Nona Dell MD Signature Date/Time: 11/13/2019/10:33:35 AM    Final     Assessment/Plan: Pamela [name] is a 86 yo with a left apical pneumothorax of unknown etiology but potentially a bleb rupture. This has resolved on repeat imaging. She has had no clinical symptoms and has maintained her saturations on RA.  -Can d/c back to SNF  -Can follow up with PCP   Updated Dr. Laural Benes.    LOS: 1 day    Lucretia Roers 11/14/2019

## 2019-11-14 NOTE — Progress Notes (Signed)
Nsg Discharge Note  Admit Date:  11/12/2019 Discharge date: 11/14/2019   Rosanne Sack to be D/C'd back to [facility] per MD order.  AVS completed.  Copy for chart, and copy for patient signed, and dated. Patient/caregiver able to verbalize understanding.  Discharge Medication: Allergies as of 11/14/2019      Reactions   Gabapentin Other (See Comments)   Blurred vision, dizziness      Medication List    TAKE these medications   acetaminophen 500 MG tablet Commonly known as: TYLENOL Take 500 mg by mouth every 6 (six) hours as needed for mild pain or moderate pain.   aspirin EC 81 MG tablet Take 81 mg by mouth every morning.   calcium-vitamin D 500-200 MG-UNIT tablet Commonly known as: OSCAL WITH D Take 1 tablet by mouth daily.   carvedilol 12.5 MG tablet Commonly known as: COREG TAKE ONE TABLET (12.5MG  TOTAL) BY MOUTH TWO TIMES DAILY What changed: See the new instructions.   docusate sodium 100 MG capsule Commonly known as: Colace Take 1 capsule (100 mg total) by mouth 2 (two) times daily.   donepezil 10 MG tablet Commonly known as: ARICEPT TAKE ONE TABLET BY MOUTH EVERY NIGHT AT BEDTIME   folic acid 1 MG tablet Commonly known as: FOLVITE Take 1 mg by mouth daily.   hydrALAZINE 25 MG tablet Commonly known as: APRESOLINE TAKE ONE TABLET BY MOUTH IN THE MORNING,TWO TABLETS AT NOON, AND ONE TABLET IN THE EVENING What changed: See the new instructions.   memantine 10 MG tablet Commonly known as: NAMENDA TAKE ONE TABLET (10MG  TOTAL) BY MOUTH TWO TIMES DAILY What changed: See the new instructions.   OCUVITE PO Take 1 tablet by mouth 2 (two) times daily.   ramipril 5 MG capsule Commonly known as: ALTACE TAKE ONE CAPSULE (5MG  TOTAL) BY MOUTH DAILY What changed: See the new instructions.   sertraline 25 MG tablet Commonly known as: ZOLOFT Take 25 mg by mouth daily.   VITAMIN D-3 PO Take 2,000 Units by mouth daily.       Discharge Assessment: Vitals:   11/14/19 1223 11/14/19 1405  BP: 140/70 140/72  Pulse: 76 75  Resp: 19 18  Temp: 98.6 F (37 C) 98.5 F (36.9 C)  SpO2: 95% 97%   Skin clean, dry and intact without evidence of skin break down, no evidence of skin tears noted. IV catheter discontinued intact. Site without signs and symptoms of complications - no redness or edema noted at insertion site, patient denies c/o pain - only slight tenderness at site.  Dressing with slight pressure applied.  D/c Instructions-Education: Discharge instructions given to patient/family with verbalized understanding. D/c education completed with patient/family including follow up instructions, medication list, d/c activities limitations if indicated, with other d/c instructions as indicated by MD - patient able to verbalize understanding, all questions fully answered. Patient instructed to return to ED, call 911, or call MD for any changes in condition.  Patient escorted via WC, and D/C home via private auto.  11/16/19, RN 11/14/2019 3:52 PM

## 2019-11-14 NOTE — TOC Transition Note (Signed)
Transition of Care [facility]) - CM/SW Discharge Note  Patient Details  Name: Pamela [name] MRN: [id number] Date of Birth: Apr 29, 1921  Transition of Care [facility]) CM/SW Contact:  Ewing Schlein, LCSW Phone Number: 11/14/2019, 3:12 PM  Clinical Narrative: New FL2 completed. FL2 and transfer report faxed to [facility]. CSW called [name] and spoke with [name]. Patient able to return, but [name] does not currently have transportation. CSW called patient's son, Pamela [name], regarding transfer and to see if family can provide transportation or if patient needs to be transferred via EMS. Son reported family can transport patient. RN notified patient will discharge with family. TOC signing off.  Final next level of care: Assisted Living Barriers to Discharge: Barriers Resolved  Patient Goals and CMS Choice Patient states their goals for this hospitalization and ongoing recovery are:: Return to [facility].gov Compare Post Acute Care list provided to:: Patient Represenative (must comment)(Pamela [name] (son))   Discharge Placement Patient to be transferred to facility by: Family Name of family member notified: Pamela [name] Patient and family notified of of transfer: 11/14/19  Discharge Plan and Services HH Arranged: PT [facility] Agency: [facility] Date [facility] Agency Contacted: 11/14/19 Time HH Agency Contacted: [phone] Representative spoke with at [facility] Agency: [name]  Readmission Risk Interventions No flowsheet data found.

## 2019-11-17 DIAGNOSIS — Z9181 History of falling: Secondary | ICD-10-CM | POA: Diagnosis not present

## 2019-11-17 DIAGNOSIS — E785 Hyperlipidemia, unspecified: Secondary | ICD-10-CM | POA: Diagnosis not present

## 2019-11-17 DIAGNOSIS — F0390 Unspecified dementia without behavioral disturbance: Secondary | ICD-10-CM | POA: Diagnosis not present

## 2019-11-17 DIAGNOSIS — Z7982 Long term (current) use of aspirin: Secondary | ICD-10-CM | POA: Diagnosis not present

## 2019-11-17 DIAGNOSIS — I2510 Atherosclerotic heart disease of native coronary artery without angina pectoris: Secondary | ICD-10-CM | POA: Diagnosis not present

## 2019-11-17 DIAGNOSIS — E559 Vitamin D deficiency, unspecified: Secondary | ICD-10-CM | POA: Diagnosis not present

## 2019-11-17 DIAGNOSIS — Z950 Presence of cardiac pacemaker: Secondary | ICD-10-CM | POA: Diagnosis not present

## 2019-11-17 DIAGNOSIS — M6250 Muscle wasting and atrophy, not elsewhere classified, unspecified site: Secondary | ICD-10-CM | POA: Diagnosis not present

## 2019-11-17 DIAGNOSIS — I10 Essential (primary) hypertension: Secondary | ICD-10-CM | POA: Diagnosis not present

## 2019-11-19 DIAGNOSIS — Z950 Presence of cardiac pacemaker: Secondary | ICD-10-CM | POA: Diagnosis not present

## 2019-11-19 DIAGNOSIS — Z9181 History of falling: Secondary | ICD-10-CM | POA: Diagnosis not present

## 2019-11-19 DIAGNOSIS — E559 Vitamin D deficiency, unspecified: Secondary | ICD-10-CM | POA: Diagnosis not present

## 2019-11-19 DIAGNOSIS — E785 Hyperlipidemia, unspecified: Secondary | ICD-10-CM | POA: Diagnosis not present

## 2019-11-19 DIAGNOSIS — Z7982 Long term (current) use of aspirin: Secondary | ICD-10-CM | POA: Diagnosis not present

## 2019-11-19 DIAGNOSIS — F339 Major depressive disorder, recurrent, unspecified: Secondary | ICD-10-CM | POA: Diagnosis not present

## 2019-11-19 DIAGNOSIS — F0390 Unspecified dementia without behavioral disturbance: Secondary | ICD-10-CM | POA: Diagnosis not present

## 2019-11-19 DIAGNOSIS — I10 Essential (primary) hypertension: Secondary | ICD-10-CM | POA: Diagnosis not present

## 2019-11-19 DIAGNOSIS — M6250 Muscle wasting and atrophy, not elsewhere classified, unspecified site: Secondary | ICD-10-CM | POA: Diagnosis not present

## 2019-11-19 DIAGNOSIS — I2510 Atherosclerotic heart disease of native coronary artery without angina pectoris: Secondary | ICD-10-CM | POA: Diagnosis not present

## 2019-11-19 DIAGNOSIS — F0150 Vascular dementia without behavioral disturbance: Secondary | ICD-10-CM | POA: Diagnosis not present

## 2019-11-20 ENCOUNTER — Emergency Department (HOSPITAL_COMMUNITY)
Admission: EM | Admit: 2019-11-20 | Discharge: 2019-11-20 | Disposition: A | Discharge status: Home / Self Care | Payer: Medicare Other | Attending: Emergency Medicine | Admitting: Emergency Medicine

## 2019-11-20 ENCOUNTER — Other Ambulatory Visit: Payer: Self-pay

## 2019-11-20 ENCOUNTER — Emergency Department (HOSPITAL_COMMUNITY): Payer: Medicare Other

## 2019-11-20 ENCOUNTER — Encounter (HOSPITAL_COMMUNITY): Payer: Self-pay

## 2019-11-20 DIAGNOSIS — W19XXXA Unspecified fall, initial encounter: Secondary | ICD-10-CM | POA: Diagnosis not present

## 2019-11-20 DIAGNOSIS — S0083XA Contusion of other part of head, initial encounter: Secondary | ICD-10-CM

## 2019-11-20 DIAGNOSIS — Y999 Unspecified external cause status: Secondary | ICD-10-CM | POA: Diagnosis not present

## 2019-11-20 DIAGNOSIS — S0181XA Laceration without foreign body of other part of head, initial encounter: Secondary | ICD-10-CM | POA: Diagnosis not present

## 2019-11-20 DIAGNOSIS — Y92122 Bedroom in nursing home as the place of occurrence of the external cause: Secondary | ICD-10-CM | POA: Diagnosis not present

## 2019-11-20 DIAGNOSIS — S0990XA Unspecified injury of head, initial encounter: Secondary | ICD-10-CM | POA: Diagnosis not present

## 2019-11-20 DIAGNOSIS — T1490XA Injury, unspecified, initial encounter: Secondary | ICD-10-CM | POA: Diagnosis not present

## 2019-11-20 DIAGNOSIS — S199XXA Unspecified injury of neck, initial encounter: Secondary | ICD-10-CM | POA: Diagnosis not present

## 2019-11-20 DIAGNOSIS — F0390 Unspecified dementia without behavioral disturbance: Secondary | ICD-10-CM | POA: Diagnosis not present

## 2019-11-20 DIAGNOSIS — Y9389 Activity, other specified: Secondary | ICD-10-CM | POA: Insufficient documentation

## 2019-11-20 DIAGNOSIS — W06XXXA Fall from bed, initial encounter: Secondary | ICD-10-CM | POA: Diagnosis not present

## 2019-11-20 NOTE — ED Triage Notes (Signed)
Pt brought in by EMS due to fall resulting in laceration to left brow. No LOC. Pt states she slid off bed and hit bed on something on bed CBG 126

## 2019-11-20 NOTE — ED Notes (Signed)
Pt to CT

## 2019-11-20 NOTE — ED Notes (Signed)
Pt returned from CT °

## 2019-11-20 NOTE — Discharge Instructions (Signed)
You were seen in the emergency department today after head injury.  CT scans of your head and neck are normal.  You do have an abrasion to your left eyebrow.  Please keep this clean and dry and allow it to heal on its own.  It did not require stitches.  Return to the emergency department any new or suddenly worsening symptoms or additional falls.  Please follow closely with your primary care doctor.

## 2019-11-20 NOTE — ED Provider Notes (Signed)
Emergency Department Provider Note   I have reviewed the triage vital signs and the nursing notes.   HISTORY  Chief Complaint Fall and Head Laceration   HPI Pamela [name] is a 86 y.o. female with PMH of CAD, HTN, HLD, St Jude AICD presents to the ED after fall out of bed this AM.  Patient is a resident at [facility]. Staff report the patient sliding out of bed and striking her head on the bedside table. Per EMS the event was witnessed and there was no LOC. Patient denies any pain or SOB. Level 5 caveat: Dementia.   Past Medical History:  Diagnosis Date  . Coronary artery disease    stent in the LAD artery in 2002  . Gait disorder 03/05/2017  . HTN (hypertension) 08/30/2012  . Hyperlipidemia 08/30/2012  . ICD (implantable cardiac defibrillator) in place   . Macular degeneration   . Pacemaker 08/29/2012   st jude accent DR RF device, model number [id number], serial number [id number], implanted 08/29/2012 for sinus bradycardia, runs of supraventricular tachycardia  last checked 10/23/2012  . Polyneuropathy in other diseases classified elsewhere (HCC)   . Shingles 08/2016  . SSS (sick sinus syndrome) with PAT and marked bradycardia with 3 sec pauses  08/30/2012  . Status post placement of cardiac pacemaker. 08/29/12 St. Jude device 08/30/2012  . Venous insufficiency     Patient Active Problem List   Diagnosis Date Noted  . Pneumothorax, left 11/13/2019  . Dementia without behavioral disturbance (HCC)   . Palliative care by specialist   . Advanced care planning/counseling discussion   . Goals of care, counseling/discussion   . Syncope 11/12/2019  . Other pancytopenia (HCC) 06/26/2017  . Gait disorder 03/05/2017  . Urinary frequency 08/10/2015  . Postoperative anemia due to acute blood loss 07/27/2015  . SVT (supraventricular tachycardia) (HCC) 07/26/2015  . Femur fracture, left (HCC) 07/24/2015  . Chronic kidney disease 07/24/2015  . Periprosthetic fracture around internal  prosthetic left hip joint (HCC) 07/24/2015  . Fall at home   . DVT (deep venous thrombosis) (HCC) 01/30/2014  . Occult blood positive stool 01/30/2014  . Thrombocytopenia, unspecified (HCC) 01/06/2014  . Anemia, iron deficiency 01/06/2014  . Hip fracture, left (HCC) 01/03/2014  . QT prolongation 01/03/2014  . Atherosclerotic cardiovascular disease 08/13/2013  . Hypertensive emergency 04/08/2013  . Dizzy 04/08/2013  . PAT (paroxysmal atrial tachycardia) (HCC) 12/25/2012  . SSS (sick sinus syndrome) with PAT and marked bradycardia with 3 sec pauses  08/30/2012  . Status post placement of cardiac pacemaker. 08/29/12 St. Jude device 08/30/2012  . HTN (hypertension) 08/30/2012  . Hyperlipidemia 08/30/2012  . Osteoporosis, unspecified 04/19/2012  . Essential and other specified forms of tremor 04/19/2012  . Essential hypertension 04/19/2012  . Cardiac dysrhythmia 04/19/2012  . Chronic ischemic heart disease 04/19/2012  . Hereditary and idiopathic peripheral neuropathy 04/19/2012  . Memory loss 04/19/2012  . Disturbance of skin sensation 04/19/2012  . Pain in joint, shoulder region 11/20/2011  . Status post complete repair of rotator cuff 11/20/2011  . Muscle weakness (generalized) 11/20/2011    Past Surgical History:  Procedure Laterality Date  . ABDOMINAL HYSTERECTOMY    . basal cell  06/2017   R side of face.  Marland Kitchen CARDIAC CATHETERIZATION  2006   3 stents placed  . CARDIAC CATHETERIZATION  06/2001   placement of BiodivYsio 2.5.10mm stent dilated to 2.15mm in proximal left anterior descenting stenotic lesion  . CESAREAN SECTION    . COLONOSCOPY W/ POLYPECTOMY    .  HIP ARTHROPLASTY Left 01/04/2014   Procedure: ARTHROPLASTY BIPOLAR HIP;  Surgeon: Mauri Pole, MD;  Location: [place];  Service: Orthopedics;  Laterality: Left;  . INSERT / REPLACE / REMOVE PACEMAKER  08/29/2012   st jude   . KIDNEY STONE SURGERY    . OPEN REDUCTION INTERNAL FIXATION (ORIF) DISTAL RADIAL FRACTURE Right  04/01/2016   Procedure: OPEN REDUCTION INTERNAL FIXATION (ORIF) RIGHT DISTAL RADIAL FRACTURE AND REPAIR;  Surgeon: Iran Planas, MD;  Location: [place];  Service: Orthopedics;  Laterality: Right;  . PERMANENT PACEMAKER INSERTION N/A 08/29/2012   Procedure: PERMANENT PACEMAKER INSERTION;  Surgeon: Sanda Klein, MD;  Location: [place] CATH LAB;  Service: Cardiovascular;  Laterality: N/A;  . RADIOFREQUENCY ABLATION  08/25/2010   ablation of left greater saphenous vein  . ROTATOR CUFF REPAIR Left   . ROTATOR CUFF REPAIR Right   . TOTAL HIP REVISION Left 07/24/2015   Procedure: ORIF LEFT PERIPROSTHETIC FEMUR FRACTURE; CONVERSION TO TOTAL HIP ARTHROPLASTY POSS. FEMORAL COMPONENT REVISION ;  Surgeon: Rod Can, MD;  Location: [place];  Service: Orthopedics;  Laterality: Left;    Allergies Gabapentin  Family History  Problem Relation Age of Onset  . Heart disease Mother   . Congestive Heart Failure Mother   . Heart disease Father   . Heart attack Father   . Heart disease Brother     Social History Social History   Tobacco Use  . Smoking status: Never Smoker  . Smokeless tobacco: Never Used  Substance Use Topics  . Alcohol use: No    Alcohol/week: 0.0 standard drinks  . Drug use: No    Review of Systems  Constitutional: No fever/chills Cardiovascular: Denies chest pain. Respiratory: Denies shortness of breath. Gastrointestinal: No abdominal pain.   Musculoskeletal: Negative for back pain. Skin: Negative for rash. Neurological: Negative for headaches, focal weakness or numbness.  10-point ROS otherwise negative.  ____________________________________________   PHYSICAL EXAM:  VITAL SIGNS: ED Triage Vitals  Enc Vitals Group     BP 11/20/19 1200 (!) 148/72     Pulse Rate 11/20/19 1200 70     Resp 11/20/19 1200 18     Temp 11/20/19 1200 98.2 F (36.8 C)     Temp Source 11/20/19 1200 Oral     SpO2 11/20/19 1200 95 %     Weight 11/20/19 1201 152 lb 1.9 oz (69 kg)     Height  11/20/19 1201 5\' 5"  (1.651 m)   Constitutional: Alert and talkative but confusion noted (baseline). Well appearing and in no acute distress. Eyes: Conjunctivae are normal. PERRL. Abrasion over the left eyebrow. Bleeding controlled.  Head: Atraumatic. Nose: No congestion/rhinnorhea. Mouth/Throat: Mucous membranes are moist.  Neck: No stridor.  No cervical spine tenderness to palpation. Cardiovascular: Normal rate, regular rhythm. Good peripheral circulation. Grossly normal heart sounds.   Respiratory: Normal respiratory effort.  No retractions. Lungs CTAB. Gastrointestinal: Soft and nontender. No distention.  Musculoskeletal: No lower extremity tenderness nor edema. No gross deformities of extremities. Normal passive ROM of the bilateral hips, knees, shoulders, elbows, and wrists.  Neurologic:  Normal speech and language.  Skin:  Skin is warm, dry and intact. No rash noted.  Abrasion over the left eyebrow with no clear laceration.  Bruising over the left fifth metacarpal but no focal tenderness.    ____________________________________________  RADIOLOGY  CT Head Wo Contrast  Result Date: 11/20/2019 CLINICAL DATA:  Laceration on the left side of the forehead due to a fall out of bed today. Initial encounter.  EXAM: CT HEAD WITHOUT CONTRAST CT CERVICAL SPINE WITHOUT CONTRAST TECHNIQUE: Multidetector CT imaging of the head and cervical spine was performed following the standard protocol without intravenous contrast. Multiplanar CT image reconstructions of the cervical spine were also generated. COMPARISON:  None. FINDINGS: CT HEAD FINDINGS Brain: No evidence of acute infarction, hemorrhage, hydrocephalus, extra-axial collection or mass lesion/mass effect. Chronic microvascular ischemic change and mild atrophy are noted. Vascular: No hyperdense vessel or unexpected calcification. Skull: Intact.  No focal lesion. Sinuses/Orbits: Negative. Other: None. CT CERVICAL SPINE FINDINGS Alignment: Maintained.  Skull base and vertebrae: No acute fracture. No primary bone lesion or focal pathologic process. Soft tissues and spinal canal: No prevertebral fluid or swelling. No visible canal hematoma. Disc levels: Loss of disc space height is seen at C3-4, C4-5 and C6-7. Scattered facet degenerative change noted. Upper chest: There is partial visualization of a left pleural effusion. Left apical scar is noted. Other: None. IMPRESSION: No acute abnormality head or cervical spine. Atrophy and chronic microvascular ischemic change. Mild cervical degenerative disease. Partial visualization of a left pleural effusion. Electronically Signed   By: Drusilla Kanner M.D.   On: 11/20/2019 13:43   CT Cervical Spine Wo Contrast  Result Date: 11/20/2019 CLINICAL DATA:  Laceration on the left side of the forehead due to a fall out of bed today. Initial encounter. EXAM: CT HEAD WITHOUT CONTRAST CT CERVICAL SPINE WITHOUT CONTRAST TECHNIQUE: Multidetector CT imaging of the head and cervical spine was performed following the standard protocol without intravenous contrast. Multiplanar CT image reconstructions of the cervical spine were also generated. COMPARISON:  None. FINDINGS: CT HEAD FINDINGS Brain: No evidence of acute infarction, hemorrhage, hydrocephalus, extra-axial collection or mass lesion/mass effect. Chronic microvascular ischemic change and mild atrophy are noted. Vascular: No hyperdense vessel or unexpected calcification. Skull: Intact.  No focal lesion. Sinuses/Orbits: Negative. Other: None. CT CERVICAL SPINE FINDINGS Alignment: Maintained. Skull base and vertebrae: No acute fracture. No primary bone lesion or focal pathologic process. Soft tissues and spinal canal: No prevertebral fluid or swelling. No visible canal hematoma. Disc levels: Loss of disc space height is seen at C3-4, C4-5 and C6-7. Scattered facet degenerative change noted. Upper chest: There is partial visualization of a left pleural effusion. Left apical scar  is noted. Other: None. IMPRESSION: No acute abnormality head or cervical spine. Atrophy and chronic microvascular ischemic change. Mild cervical degenerative disease. Partial visualization of a left pleural effusion. Electronically Signed   By: Drusilla Kanner M.D.   On: 11/20/2019 13:43    ____________________________________________   PROCEDURES  Procedure(s) performed:   Procedures  None  ____________________________________________   INITIAL IMPRESSION / ASSESSMENT AND PLAN / ED COURSE  Pertinent labs & imaging results that were available during my care of the patient were reviewed by me and considered in my medical decision making (see chart for details).   Patient presents emergency department evaluation after sliding out of bed at her nursing facility.  EMS report no loss of consciousness.  Patient has no complaints here.  Abrasion/small puncture to the left eyebrow.  Wound is hemostatic.  Will clean and evaluated but we will likely not require suture.   02:15 PM  CT imaging of the head and cervical spine reviewed.  No acute findings.  The patient's daughter-in-law is now at bedside and confirmed that the patient is currently at her mental status baseline.  Patient is awake and alert.  I updated her on her CT findings.  Discussed with patient and daughter-in-law at bedside  that we will allow the wound to heal on its own without stitches.  Advised to keep the area clean and dry.  Discussed ED return precautions along with PCP follow-up plan.  Daughter in law can drive the patient back to [facility].  ________________________  FINAL CLINICAL IMPRESSION(S) / ED DIAGNOSES  Final diagnoses:  Injury of head, initial encounter  Contusion of face, initial encounter     Note:  This document was prepared using Dragon voice recognition software and may include unintentional dictation errors.  Alona Bene, MD, [facility] Emergency Medicine    Kyllian Clingerman, Arlyss Repress, MD 11/20/19 [phone]

## 2019-11-22 DIAGNOSIS — G301 Alzheimer's disease with late onset: Secondary | ICD-10-CM | POA: Diagnosis not present

## 2019-11-22 DIAGNOSIS — F339 Major depressive disorder, recurrent, unspecified: Secondary | ICD-10-CM | POA: Diagnosis not present

## 2019-11-24 DIAGNOSIS — M6250 Muscle wasting and atrophy, not elsewhere classified, unspecified site: Secondary | ICD-10-CM | POA: Diagnosis not present

## 2019-11-24 DIAGNOSIS — E559 Vitamin D deficiency, unspecified: Secondary | ICD-10-CM | POA: Diagnosis not present

## 2019-11-24 DIAGNOSIS — Z7982 Long term (current) use of aspirin: Secondary | ICD-10-CM | POA: Diagnosis not present

## 2019-11-24 DIAGNOSIS — Z950 Presence of cardiac pacemaker: Secondary | ICD-10-CM | POA: Diagnosis not present

## 2019-11-24 DIAGNOSIS — I2510 Atherosclerotic heart disease of native coronary artery without angina pectoris: Secondary | ICD-10-CM | POA: Diagnosis not present

## 2019-11-24 DIAGNOSIS — Z9181 History of falling: Secondary | ICD-10-CM | POA: Diagnosis not present

## 2019-11-24 DIAGNOSIS — E785 Hyperlipidemia, unspecified: Secondary | ICD-10-CM | POA: Diagnosis not present

## 2019-11-24 DIAGNOSIS — F0390 Unspecified dementia without behavioral disturbance: Secondary | ICD-10-CM | POA: Diagnosis not present

## 2019-11-24 DIAGNOSIS — I10 Essential (primary) hypertension: Secondary | ICD-10-CM | POA: Diagnosis not present

## 2019-11-26 DIAGNOSIS — I2510 Atherosclerotic heart disease of native coronary artery without angina pectoris: Secondary | ICD-10-CM | POA: Diagnosis not present

## 2019-11-26 DIAGNOSIS — E559 Vitamin D deficiency, unspecified: Secondary | ICD-10-CM | POA: Diagnosis not present

## 2019-11-26 DIAGNOSIS — Z9181 History of falling: Secondary | ICD-10-CM | POA: Diagnosis not present

## 2019-11-26 DIAGNOSIS — Z7982 Long term (current) use of aspirin: Secondary | ICD-10-CM | POA: Diagnosis not present

## 2019-11-26 DIAGNOSIS — E785 Hyperlipidemia, unspecified: Secondary | ICD-10-CM | POA: Diagnosis not present

## 2019-11-26 DIAGNOSIS — I10 Essential (primary) hypertension: Secondary | ICD-10-CM | POA: Diagnosis not present

## 2019-11-26 DIAGNOSIS — Z950 Presence of cardiac pacemaker: Secondary | ICD-10-CM | POA: Diagnosis not present

## 2019-11-26 DIAGNOSIS — M6250 Muscle wasting and atrophy, not elsewhere classified, unspecified site: Secondary | ICD-10-CM | POA: Diagnosis not present

## 2019-11-26 DIAGNOSIS — F0390 Unspecified dementia without behavioral disturbance: Secondary | ICD-10-CM | POA: Diagnosis not present

## 2019-11-27 DIAGNOSIS — I2510 Atherosclerotic heart disease of native coronary artery without angina pectoris: Secondary | ICD-10-CM | POA: Diagnosis not present

## 2019-11-27 DIAGNOSIS — E559 Vitamin D deficiency, unspecified: Secondary | ICD-10-CM | POA: Diagnosis not present

## 2019-11-27 DIAGNOSIS — M6250 Muscle wasting and atrophy, not elsewhere classified, unspecified site: Secondary | ICD-10-CM | POA: Diagnosis not present

## 2019-11-27 DIAGNOSIS — I10 Essential (primary) hypertension: Secondary | ICD-10-CM | POA: Diagnosis not present

## 2019-11-27 DIAGNOSIS — Z7982 Long term (current) use of aspirin: Secondary | ICD-10-CM | POA: Diagnosis not present

## 2019-11-27 DIAGNOSIS — E785 Hyperlipidemia, unspecified: Secondary | ICD-10-CM | POA: Diagnosis not present

## 2019-11-27 DIAGNOSIS — F0390 Unspecified dementia without behavioral disturbance: Secondary | ICD-10-CM | POA: Diagnosis not present

## 2019-11-27 DIAGNOSIS — Z9181 History of falling: Secondary | ICD-10-CM | POA: Diagnosis not present

## 2019-11-27 DIAGNOSIS — Z950 Presence of cardiac pacemaker: Secondary | ICD-10-CM | POA: Diagnosis not present

## 2019-12-01 DIAGNOSIS — F0631 Mood disorder due to known physiological condition with depressive features: Secondary | ICD-10-CM | POA: Diagnosis not present

## 2019-12-01 DIAGNOSIS — G301 Alzheimer's disease with late onset: Secondary | ICD-10-CM | POA: Diagnosis not present

## 2019-12-02 DIAGNOSIS — Z96642 Presence of left artificial hip joint: Secondary | ICD-10-CM | POA: Diagnosis not present

## 2019-12-02 DIAGNOSIS — R55 Syncope and collapse: Secondary | ICD-10-CM | POA: Diagnosis not present

## 2019-12-02 DIAGNOSIS — M6250 Muscle wasting and atrophy, not elsewhere classified, unspecified site: Secondary | ICD-10-CM | POA: Diagnosis not present

## 2019-12-02 DIAGNOSIS — E785 Hyperlipidemia, unspecified: Secondary | ICD-10-CM | POA: Diagnosis not present

## 2019-12-02 DIAGNOSIS — Z4800 Encounter for change or removal of nonsurgical wound dressing: Secondary | ICD-10-CM | POA: Diagnosis not present

## 2019-12-02 DIAGNOSIS — I10 Essential (primary) hypertension: Secondary | ICD-10-CM | POA: Diagnosis not present

## 2019-12-02 DIAGNOSIS — Z950 Presence of cardiac pacemaker: Secondary | ICD-10-CM | POA: Diagnosis not present

## 2019-12-02 DIAGNOSIS — E559 Vitamin D deficiency, unspecified: Secondary | ICD-10-CM | POA: Diagnosis not present

## 2019-12-02 DIAGNOSIS — Z7982 Long term (current) use of aspirin: Secondary | ICD-10-CM | POA: Diagnosis not present

## 2019-12-02 DIAGNOSIS — Z9181 History of falling: Secondary | ICD-10-CM | POA: Diagnosis not present

## 2019-12-02 DIAGNOSIS — I495 Sick sinus syndrome: Secondary | ICD-10-CM | POA: Diagnosis not present

## 2019-12-02 DIAGNOSIS — F0390 Unspecified dementia without behavioral disturbance: Secondary | ICD-10-CM | POA: Diagnosis not present

## 2019-12-02 DIAGNOSIS — L89622 Pressure ulcer of left heel, stage 2: Secondary | ICD-10-CM | POA: Diagnosis not present

## 2019-12-02 DIAGNOSIS — I2510 Atherosclerotic heart disease of native coronary artery without angina pectoris: Secondary | ICD-10-CM | POA: Diagnosis not present

## 2019-12-04 DIAGNOSIS — L89622 Pressure ulcer of left heel, stage 2: Secondary | ICD-10-CM | POA: Diagnosis not present

## 2019-12-04 DIAGNOSIS — E559 Vitamin D deficiency, unspecified: Secondary | ICD-10-CM | POA: Diagnosis not present

## 2019-12-04 DIAGNOSIS — Z9181 History of falling: Secondary | ICD-10-CM | POA: Diagnosis not present

## 2019-12-04 DIAGNOSIS — I10 Essential (primary) hypertension: Secondary | ICD-10-CM | POA: Diagnosis not present

## 2019-12-04 DIAGNOSIS — Z7982 Long term (current) use of aspirin: Secondary | ICD-10-CM | POA: Diagnosis not present

## 2019-12-04 DIAGNOSIS — M6250 Muscle wasting and atrophy, not elsewhere classified, unspecified site: Secondary | ICD-10-CM | POA: Diagnosis not present

## 2019-12-04 DIAGNOSIS — Z4800 Encounter for change or removal of nonsurgical wound dressing: Secondary | ICD-10-CM | POA: Diagnosis not present

## 2019-12-04 DIAGNOSIS — I495 Sick sinus syndrome: Secondary | ICD-10-CM | POA: Diagnosis not present

## 2019-12-04 DIAGNOSIS — E785 Hyperlipidemia, unspecified: Secondary | ICD-10-CM | POA: Diagnosis not present

## 2019-12-04 DIAGNOSIS — F0390 Unspecified dementia without behavioral disturbance: Secondary | ICD-10-CM | POA: Diagnosis not present

## 2019-12-04 DIAGNOSIS — Z96642 Presence of left artificial hip joint: Secondary | ICD-10-CM | POA: Diagnosis not present

## 2019-12-04 DIAGNOSIS — I2510 Atherosclerotic heart disease of native coronary artery without angina pectoris: Secondary | ICD-10-CM | POA: Diagnosis not present

## 2019-12-04 DIAGNOSIS — Z950 Presence of cardiac pacemaker: Secondary | ICD-10-CM | POA: Diagnosis not present

## 2019-12-04 DIAGNOSIS — R55 Syncope and collapse: Secondary | ICD-10-CM | POA: Diagnosis not present

## 2019-12-09 DIAGNOSIS — L89622 Pressure ulcer of left heel, stage 2: Secondary | ICD-10-CM | POA: Diagnosis not present

## 2019-12-09 DIAGNOSIS — Z950 Presence of cardiac pacemaker: Secondary | ICD-10-CM | POA: Diagnosis not present

## 2019-12-09 DIAGNOSIS — M6250 Muscle wasting and atrophy, not elsewhere classified, unspecified site: Secondary | ICD-10-CM | POA: Diagnosis not present

## 2019-12-09 DIAGNOSIS — F0390 Unspecified dementia without behavioral disturbance: Secondary | ICD-10-CM | POA: Diagnosis not present

## 2019-12-09 DIAGNOSIS — E559 Vitamin D deficiency, unspecified: Secondary | ICD-10-CM | POA: Diagnosis not present

## 2019-12-09 DIAGNOSIS — R55 Syncope and collapse: Secondary | ICD-10-CM | POA: Diagnosis not present

## 2019-12-09 DIAGNOSIS — I10 Essential (primary) hypertension: Secondary | ICD-10-CM | POA: Diagnosis not present

## 2019-12-09 DIAGNOSIS — Z9181 History of falling: Secondary | ICD-10-CM | POA: Diagnosis not present

## 2019-12-09 DIAGNOSIS — Z96642 Presence of left artificial hip joint: Secondary | ICD-10-CM | POA: Diagnosis not present

## 2019-12-09 DIAGNOSIS — Z4800 Encounter for change or removal of nonsurgical wound dressing: Secondary | ICD-10-CM | POA: Diagnosis not present

## 2019-12-09 DIAGNOSIS — I2510 Atherosclerotic heart disease of native coronary artery without angina pectoris: Secondary | ICD-10-CM | POA: Diagnosis not present

## 2019-12-09 DIAGNOSIS — E785 Hyperlipidemia, unspecified: Secondary | ICD-10-CM | POA: Diagnosis not present

## 2019-12-09 DIAGNOSIS — Z7982 Long term (current) use of aspirin: Secondary | ICD-10-CM | POA: Diagnosis not present

## 2019-12-09 DIAGNOSIS — I495 Sick sinus syndrome: Secondary | ICD-10-CM | POA: Diagnosis not present

## 2019-12-10 DIAGNOSIS — E559 Vitamin D deficiency, unspecified: Secondary | ICD-10-CM | POA: Diagnosis not present

## 2019-12-10 DIAGNOSIS — G301 Alzheimer's disease with late onset: Secondary | ICD-10-CM | POA: Diagnosis not present

## 2019-12-10 DIAGNOSIS — F0631 Mood disorder due to known physiological condition with depressive features: Secondary | ICD-10-CM | POA: Diagnosis not present

## 2019-12-12 DIAGNOSIS — Z950 Presence of cardiac pacemaker: Secondary | ICD-10-CM | POA: Diagnosis not present

## 2019-12-12 DIAGNOSIS — F0390 Unspecified dementia without behavioral disturbance: Secondary | ICD-10-CM | POA: Diagnosis not present

## 2019-12-12 DIAGNOSIS — M6250 Muscle wasting and atrophy, not elsewhere classified, unspecified site: Secondary | ICD-10-CM | POA: Diagnosis not present

## 2019-12-12 DIAGNOSIS — E559 Vitamin D deficiency, unspecified: Secondary | ICD-10-CM | POA: Diagnosis not present

## 2019-12-12 DIAGNOSIS — Z9181 History of falling: Secondary | ICD-10-CM | POA: Diagnosis not present

## 2019-12-12 DIAGNOSIS — E785 Hyperlipidemia, unspecified: Secondary | ICD-10-CM | POA: Diagnosis not present

## 2019-12-12 DIAGNOSIS — I2510 Atherosclerotic heart disease of native coronary artery without angina pectoris: Secondary | ICD-10-CM | POA: Diagnosis not present

## 2019-12-12 DIAGNOSIS — I10 Essential (primary) hypertension: Secondary | ICD-10-CM | POA: Diagnosis not present

## 2019-12-12 DIAGNOSIS — I495 Sick sinus syndrome: Secondary | ICD-10-CM | POA: Diagnosis not present

## 2019-12-12 DIAGNOSIS — Z7982 Long term (current) use of aspirin: Secondary | ICD-10-CM | POA: Diagnosis not present

## 2019-12-12 DIAGNOSIS — Z96642 Presence of left artificial hip joint: Secondary | ICD-10-CM | POA: Diagnosis not present

## 2019-12-12 DIAGNOSIS — R55 Syncope and collapse: Secondary | ICD-10-CM | POA: Diagnosis not present

## 2019-12-12 DIAGNOSIS — Z4800 Encounter for change or removal of nonsurgical wound dressing: Secondary | ICD-10-CM | POA: Diagnosis not present

## 2019-12-12 DIAGNOSIS — L89622 Pressure ulcer of left heel, stage 2: Secondary | ICD-10-CM | POA: Diagnosis not present

## 2019-12-13 DIAGNOSIS — Z7982 Long term (current) use of aspirin: Secondary | ICD-10-CM | POA: Diagnosis not present

## 2019-12-13 DIAGNOSIS — Z96642 Presence of left artificial hip joint: Secondary | ICD-10-CM | POA: Diagnosis not present

## 2019-12-13 DIAGNOSIS — E785 Hyperlipidemia, unspecified: Secondary | ICD-10-CM | POA: Diagnosis not present

## 2019-12-13 DIAGNOSIS — I2510 Atherosclerotic heart disease of native coronary artery without angina pectoris: Secondary | ICD-10-CM | POA: Diagnosis not present

## 2019-12-13 DIAGNOSIS — F0390 Unspecified dementia without behavioral disturbance: Secondary | ICD-10-CM | POA: Diagnosis not present

## 2019-12-13 DIAGNOSIS — I10 Essential (primary) hypertension: Secondary | ICD-10-CM | POA: Diagnosis not present

## 2019-12-13 DIAGNOSIS — R55 Syncope and collapse: Secondary | ICD-10-CM | POA: Diagnosis not present

## 2019-12-13 DIAGNOSIS — Z4800 Encounter for change or removal of nonsurgical wound dressing: Secondary | ICD-10-CM | POA: Diagnosis not present

## 2019-12-13 DIAGNOSIS — Z950 Presence of cardiac pacemaker: Secondary | ICD-10-CM | POA: Diagnosis not present

## 2019-12-13 DIAGNOSIS — E559 Vitamin D deficiency, unspecified: Secondary | ICD-10-CM | POA: Diagnosis not present

## 2019-12-13 DIAGNOSIS — M6250 Muscle wasting and atrophy, not elsewhere classified, unspecified site: Secondary | ICD-10-CM | POA: Diagnosis not present

## 2019-12-13 DIAGNOSIS — Z9181 History of falling: Secondary | ICD-10-CM | POA: Diagnosis not present

## 2019-12-13 DIAGNOSIS — I495 Sick sinus syndrome: Secondary | ICD-10-CM | POA: Diagnosis not present

## 2019-12-13 DIAGNOSIS — L89622 Pressure ulcer of left heel, stage 2: Secondary | ICD-10-CM | POA: Diagnosis not present

## 2019-12-15 DIAGNOSIS — Z96642 Presence of left artificial hip joint: Secondary | ICD-10-CM | POA: Diagnosis not present

## 2019-12-15 DIAGNOSIS — Z7982 Long term (current) use of aspirin: Secondary | ICD-10-CM | POA: Diagnosis not present

## 2019-12-15 DIAGNOSIS — Z4800 Encounter for change or removal of nonsurgical wound dressing: Secondary | ICD-10-CM | POA: Diagnosis not present

## 2019-12-15 DIAGNOSIS — E559 Vitamin D deficiency, unspecified: Secondary | ICD-10-CM | POA: Diagnosis not present

## 2019-12-15 DIAGNOSIS — I495 Sick sinus syndrome: Secondary | ICD-10-CM | POA: Diagnosis not present

## 2019-12-15 DIAGNOSIS — Z950 Presence of cardiac pacemaker: Secondary | ICD-10-CM | POA: Diagnosis not present

## 2019-12-15 DIAGNOSIS — R55 Syncope and collapse: Secondary | ICD-10-CM | POA: Diagnosis not present

## 2019-12-15 DIAGNOSIS — M6250 Muscle wasting and atrophy, not elsewhere classified, unspecified site: Secondary | ICD-10-CM | POA: Diagnosis not present

## 2019-12-15 DIAGNOSIS — E785 Hyperlipidemia, unspecified: Secondary | ICD-10-CM | POA: Diagnosis not present

## 2019-12-15 DIAGNOSIS — Z9181 History of falling: Secondary | ICD-10-CM | POA: Diagnosis not present

## 2019-12-15 DIAGNOSIS — I10 Essential (primary) hypertension: Secondary | ICD-10-CM | POA: Diagnosis not present

## 2019-12-15 DIAGNOSIS — I2510 Atherosclerotic heart disease of native coronary artery without angina pectoris: Secondary | ICD-10-CM | POA: Diagnosis not present

## 2019-12-15 DIAGNOSIS — L89622 Pressure ulcer of left heel, stage 2: Secondary | ICD-10-CM | POA: Diagnosis not present

## 2019-12-15 DIAGNOSIS — F0390 Unspecified dementia without behavioral disturbance: Secondary | ICD-10-CM | POA: Diagnosis not present

## 2019-12-16 DIAGNOSIS — Z950 Presence of cardiac pacemaker: Secondary | ICD-10-CM | POA: Diagnosis not present

## 2019-12-16 DIAGNOSIS — I495 Sick sinus syndrome: Secondary | ICD-10-CM | POA: Diagnosis not present

## 2019-12-16 DIAGNOSIS — L89622 Pressure ulcer of left heel, stage 2: Secondary | ICD-10-CM | POA: Diagnosis not present

## 2019-12-16 DIAGNOSIS — I10 Essential (primary) hypertension: Secondary | ICD-10-CM | POA: Diagnosis not present

## 2019-12-16 DIAGNOSIS — Z9181 History of falling: Secondary | ICD-10-CM | POA: Diagnosis not present

## 2019-12-16 DIAGNOSIS — I2510 Atherosclerotic heart disease of native coronary artery without angina pectoris: Secondary | ICD-10-CM | POA: Diagnosis not present

## 2019-12-16 DIAGNOSIS — R55 Syncope and collapse: Secondary | ICD-10-CM | POA: Diagnosis not present

## 2019-12-16 DIAGNOSIS — F0390 Unspecified dementia without behavioral disturbance: Secondary | ICD-10-CM | POA: Diagnosis not present

## 2019-12-16 DIAGNOSIS — Z4800 Encounter for change or removal of nonsurgical wound dressing: Secondary | ICD-10-CM | POA: Diagnosis not present

## 2019-12-16 DIAGNOSIS — E785 Hyperlipidemia, unspecified: Secondary | ICD-10-CM | POA: Diagnosis not present

## 2019-12-16 DIAGNOSIS — E559 Vitamin D deficiency, unspecified: Secondary | ICD-10-CM | POA: Diagnosis not present

## 2019-12-16 DIAGNOSIS — Z7982 Long term (current) use of aspirin: Secondary | ICD-10-CM | POA: Diagnosis not present

## 2019-12-16 DIAGNOSIS — M6250 Muscle wasting and atrophy, not elsewhere classified, unspecified site: Secondary | ICD-10-CM | POA: Diagnosis not present

## 2019-12-16 DIAGNOSIS — Z96642 Presence of left artificial hip joint: Secondary | ICD-10-CM | POA: Diagnosis not present

## 2019-12-17 DIAGNOSIS — F0150 Vascular dementia without behavioral disturbance: Secondary | ICD-10-CM | POA: Diagnosis not present

## 2019-12-17 DIAGNOSIS — G301 Alzheimer's disease with late onset: Secondary | ICD-10-CM | POA: Diagnosis not present

## 2019-12-17 DIAGNOSIS — F0631 Mood disorder due to known physiological condition with depressive features: Secondary | ICD-10-CM | POA: Diagnosis not present

## 2019-12-18 DIAGNOSIS — I2510 Atherosclerotic heart disease of native coronary artery without angina pectoris: Secondary | ICD-10-CM | POA: Diagnosis not present

## 2019-12-18 DIAGNOSIS — E559 Vitamin D deficiency, unspecified: Secondary | ICD-10-CM | POA: Diagnosis not present

## 2019-12-18 DIAGNOSIS — L89622 Pressure ulcer of left heel, stage 2: Secondary | ICD-10-CM | POA: Diagnosis not present

## 2019-12-18 DIAGNOSIS — Z4800 Encounter for change or removal of nonsurgical wound dressing: Secondary | ICD-10-CM | POA: Diagnosis not present

## 2019-12-18 DIAGNOSIS — E785 Hyperlipidemia, unspecified: Secondary | ICD-10-CM | POA: Diagnosis not present

## 2019-12-18 DIAGNOSIS — Z950 Presence of cardiac pacemaker: Secondary | ICD-10-CM | POA: Diagnosis not present

## 2019-12-18 DIAGNOSIS — I10 Essential (primary) hypertension: Secondary | ICD-10-CM | POA: Diagnosis not present

## 2019-12-18 DIAGNOSIS — F0390 Unspecified dementia without behavioral disturbance: Secondary | ICD-10-CM | POA: Diagnosis not present

## 2019-12-18 DIAGNOSIS — Z9181 History of falling: Secondary | ICD-10-CM | POA: Diagnosis not present

## 2019-12-18 DIAGNOSIS — Z7982 Long term (current) use of aspirin: Secondary | ICD-10-CM | POA: Diagnosis not present

## 2019-12-18 DIAGNOSIS — R55 Syncope and collapse: Secondary | ICD-10-CM | POA: Diagnosis not present

## 2019-12-18 DIAGNOSIS — I495 Sick sinus syndrome: Secondary | ICD-10-CM | POA: Diagnosis not present

## 2019-12-18 DIAGNOSIS — M6250 Muscle wasting and atrophy, not elsewhere classified, unspecified site: Secondary | ICD-10-CM | POA: Diagnosis not present

## 2019-12-18 DIAGNOSIS — Z96642 Presence of left artificial hip joint: Secondary | ICD-10-CM | POA: Diagnosis not present

## 2019-12-22 DIAGNOSIS — Z4800 Encounter for change or removal of nonsurgical wound dressing: Secondary | ICD-10-CM | POA: Diagnosis not present

## 2019-12-22 DIAGNOSIS — I495 Sick sinus syndrome: Secondary | ICD-10-CM | POA: Diagnosis not present

## 2019-12-22 DIAGNOSIS — I10 Essential (primary) hypertension: Secondary | ICD-10-CM | POA: Diagnosis not present

## 2019-12-22 DIAGNOSIS — L89622 Pressure ulcer of left heel, stage 2: Secondary | ICD-10-CM | POA: Diagnosis not present

## 2019-12-22 DIAGNOSIS — I2510 Atherosclerotic heart disease of native coronary artery without angina pectoris: Secondary | ICD-10-CM | POA: Diagnosis not present

## 2019-12-22 DIAGNOSIS — E785 Hyperlipidemia, unspecified: Secondary | ICD-10-CM | POA: Diagnosis not present

## 2019-12-22 DIAGNOSIS — Z7982 Long term (current) use of aspirin: Secondary | ICD-10-CM | POA: Diagnosis not present

## 2019-12-22 DIAGNOSIS — R55 Syncope and collapse: Secondary | ICD-10-CM | POA: Diagnosis not present

## 2019-12-22 DIAGNOSIS — M6250 Muscle wasting and atrophy, not elsewhere classified, unspecified site: Secondary | ICD-10-CM | POA: Diagnosis not present

## 2019-12-22 DIAGNOSIS — Z9181 History of falling: Secondary | ICD-10-CM | POA: Diagnosis not present

## 2019-12-22 DIAGNOSIS — Z96642 Presence of left artificial hip joint: Secondary | ICD-10-CM | POA: Diagnosis not present

## 2019-12-22 DIAGNOSIS — F0390 Unspecified dementia without behavioral disturbance: Secondary | ICD-10-CM | POA: Diagnosis not present

## 2019-12-22 DIAGNOSIS — E559 Vitamin D deficiency, unspecified: Secondary | ICD-10-CM | POA: Diagnosis not present

## 2019-12-22 DIAGNOSIS — Z950 Presence of cardiac pacemaker: Secondary | ICD-10-CM | POA: Diagnosis not present

## 2019-12-25 DIAGNOSIS — Z96642 Presence of left artificial hip joint: Secondary | ICD-10-CM | POA: Diagnosis not present

## 2019-12-25 DIAGNOSIS — F0390 Unspecified dementia without behavioral disturbance: Secondary | ICD-10-CM | POA: Diagnosis not present

## 2019-12-25 DIAGNOSIS — E785 Hyperlipidemia, unspecified: Secondary | ICD-10-CM | POA: Diagnosis not present

## 2019-12-25 DIAGNOSIS — M6250 Muscle wasting and atrophy, not elsewhere classified, unspecified site: Secondary | ICD-10-CM | POA: Diagnosis not present

## 2019-12-25 DIAGNOSIS — L89622 Pressure ulcer of left heel, stage 2: Secondary | ICD-10-CM | POA: Diagnosis not present

## 2019-12-25 DIAGNOSIS — I10 Essential (primary) hypertension: Secondary | ICD-10-CM | POA: Diagnosis not present

## 2019-12-25 DIAGNOSIS — Z4800 Encounter for change or removal of nonsurgical wound dressing: Secondary | ICD-10-CM | POA: Diagnosis not present

## 2019-12-25 DIAGNOSIS — E559 Vitamin D deficiency, unspecified: Secondary | ICD-10-CM | POA: Diagnosis not present

## 2019-12-25 DIAGNOSIS — Z7982 Long term (current) use of aspirin: Secondary | ICD-10-CM | POA: Diagnosis not present

## 2019-12-25 DIAGNOSIS — Z9181 History of falling: Secondary | ICD-10-CM | POA: Diagnosis not present

## 2019-12-25 DIAGNOSIS — I495 Sick sinus syndrome: Secondary | ICD-10-CM | POA: Diagnosis not present

## 2019-12-25 DIAGNOSIS — I2510 Atherosclerotic heart disease of native coronary artery without angina pectoris: Secondary | ICD-10-CM | POA: Diagnosis not present

## 2019-12-25 DIAGNOSIS — R55 Syncope and collapse: Secondary | ICD-10-CM | POA: Diagnosis not present

## 2019-12-25 DIAGNOSIS — Z950 Presence of cardiac pacemaker: Secondary | ICD-10-CM | POA: Diagnosis not present

## 2019-12-30 DIAGNOSIS — Z96642 Presence of left artificial hip joint: Secondary | ICD-10-CM | POA: Diagnosis not present

## 2019-12-30 DIAGNOSIS — Z4800 Encounter for change or removal of nonsurgical wound dressing: Secondary | ICD-10-CM | POA: Diagnosis not present

## 2019-12-30 DIAGNOSIS — E785 Hyperlipidemia, unspecified: Secondary | ICD-10-CM | POA: Diagnosis not present

## 2019-12-30 DIAGNOSIS — Z950 Presence of cardiac pacemaker: Secondary | ICD-10-CM | POA: Diagnosis not present

## 2019-12-30 DIAGNOSIS — Z9181 History of falling: Secondary | ICD-10-CM | POA: Diagnosis not present

## 2019-12-30 DIAGNOSIS — L89622 Pressure ulcer of left heel, stage 2: Secondary | ICD-10-CM | POA: Diagnosis not present

## 2019-12-30 DIAGNOSIS — F0390 Unspecified dementia without behavioral disturbance: Secondary | ICD-10-CM | POA: Diagnosis not present

## 2019-12-30 DIAGNOSIS — I2510 Atherosclerotic heart disease of native coronary artery without angina pectoris: Secondary | ICD-10-CM | POA: Diagnosis not present

## 2019-12-30 DIAGNOSIS — I10 Essential (primary) hypertension: Secondary | ICD-10-CM | POA: Diagnosis not present

## 2019-12-30 DIAGNOSIS — E559 Vitamin D deficiency, unspecified: Secondary | ICD-10-CM | POA: Diagnosis not present

## 2019-12-30 DIAGNOSIS — R55 Syncope and collapse: Secondary | ICD-10-CM | POA: Diagnosis not present

## 2019-12-30 DIAGNOSIS — Z7982 Long term (current) use of aspirin: Secondary | ICD-10-CM | POA: Diagnosis not present

## 2019-12-30 DIAGNOSIS — I495 Sick sinus syndrome: Secondary | ICD-10-CM | POA: Diagnosis not present

## 2019-12-30 DIAGNOSIS — M6250 Muscle wasting and atrophy, not elsewhere classified, unspecified site: Secondary | ICD-10-CM | POA: Diagnosis not present

## 2020-01-01 DIAGNOSIS — E785 Hyperlipidemia, unspecified: Secondary | ICD-10-CM | POA: Diagnosis not present

## 2020-01-01 DIAGNOSIS — Z96642 Presence of left artificial hip joint: Secondary | ICD-10-CM | POA: Diagnosis not present

## 2020-01-01 DIAGNOSIS — Z950 Presence of cardiac pacemaker: Secondary | ICD-10-CM | POA: Diagnosis not present

## 2020-01-01 DIAGNOSIS — Z7982 Long term (current) use of aspirin: Secondary | ICD-10-CM | POA: Diagnosis not present

## 2020-01-01 DIAGNOSIS — E559 Vitamin D deficiency, unspecified: Secondary | ICD-10-CM | POA: Diagnosis not present

## 2020-01-01 DIAGNOSIS — L89622 Pressure ulcer of left heel, stage 2: Secondary | ICD-10-CM | POA: Diagnosis not present

## 2020-01-01 DIAGNOSIS — M6250 Muscle wasting and atrophy, not elsewhere classified, unspecified site: Secondary | ICD-10-CM | POA: Diagnosis not present

## 2020-01-01 DIAGNOSIS — I2510 Atherosclerotic heart disease of native coronary artery without angina pectoris: Secondary | ICD-10-CM | POA: Diagnosis not present

## 2020-01-01 DIAGNOSIS — Z4800 Encounter for change or removal of nonsurgical wound dressing: Secondary | ICD-10-CM | POA: Diagnosis not present

## 2020-01-01 DIAGNOSIS — F0390 Unspecified dementia without behavioral disturbance: Secondary | ICD-10-CM | POA: Diagnosis not present

## 2020-01-01 DIAGNOSIS — I495 Sick sinus syndrome: Secondary | ICD-10-CM | POA: Diagnosis not present

## 2020-01-01 DIAGNOSIS — Z9181 History of falling: Secondary | ICD-10-CM | POA: Diagnosis not present

## 2020-01-01 DIAGNOSIS — R55 Syncope and collapse: Secondary | ICD-10-CM | POA: Diagnosis not present

## 2020-01-01 DIAGNOSIS — I10 Essential (primary) hypertension: Secondary | ICD-10-CM | POA: Diagnosis not present

## 2020-01-05 DIAGNOSIS — F0390 Unspecified dementia without behavioral disturbance: Secondary | ICD-10-CM | POA: Diagnosis not present

## 2020-01-05 DIAGNOSIS — E785 Hyperlipidemia, unspecified: Secondary | ICD-10-CM | POA: Diagnosis not present

## 2020-01-05 DIAGNOSIS — I2510 Atherosclerotic heart disease of native coronary artery without angina pectoris: Secondary | ICD-10-CM | POA: Diagnosis not present

## 2020-01-05 DIAGNOSIS — Z96642 Presence of left artificial hip joint: Secondary | ICD-10-CM | POA: Diagnosis not present

## 2020-01-05 DIAGNOSIS — Z7982 Long term (current) use of aspirin: Secondary | ICD-10-CM | POA: Diagnosis not present

## 2020-01-05 DIAGNOSIS — I10 Essential (primary) hypertension: Secondary | ICD-10-CM | POA: Diagnosis not present

## 2020-01-05 DIAGNOSIS — I495 Sick sinus syndrome: Secondary | ICD-10-CM | POA: Diagnosis not present

## 2020-01-05 DIAGNOSIS — R55 Syncope and collapse: Secondary | ICD-10-CM | POA: Diagnosis not present

## 2020-01-05 DIAGNOSIS — Z9181 History of falling: Secondary | ICD-10-CM | POA: Diagnosis not present

## 2020-01-05 DIAGNOSIS — L89622 Pressure ulcer of left heel, stage 2: Secondary | ICD-10-CM | POA: Diagnosis not present

## 2020-01-05 DIAGNOSIS — Z950 Presence of cardiac pacemaker: Secondary | ICD-10-CM | POA: Diagnosis not present

## 2020-01-05 DIAGNOSIS — M6250 Muscle wasting and atrophy, not elsewhere classified, unspecified site: Secondary | ICD-10-CM | POA: Diagnosis not present

## 2020-01-05 DIAGNOSIS — E559 Vitamin D deficiency, unspecified: Secondary | ICD-10-CM | POA: Diagnosis not present

## 2020-01-05 DIAGNOSIS — Z4800 Encounter for change or removal of nonsurgical wound dressing: Secondary | ICD-10-CM | POA: Diagnosis not present

## 2020-01-07 DIAGNOSIS — I10 Essential (primary) hypertension: Secondary | ICD-10-CM | POA: Diagnosis not present

## 2020-01-07 DIAGNOSIS — K5909 Other constipation: Secondary | ICD-10-CM | POA: Diagnosis not present

## 2020-01-07 DIAGNOSIS — G894 Chronic pain syndrome: Secondary | ICD-10-CM | POA: Diagnosis not present

## 2020-01-09 DIAGNOSIS — R55 Syncope and collapse: Secondary | ICD-10-CM | POA: Diagnosis not present

## 2020-01-09 DIAGNOSIS — Z950 Presence of cardiac pacemaker: Secondary | ICD-10-CM | POA: Diagnosis not present

## 2020-01-09 DIAGNOSIS — I495 Sick sinus syndrome: Secondary | ICD-10-CM | POA: Diagnosis not present

## 2020-01-09 DIAGNOSIS — M6250 Muscle wasting and atrophy, not elsewhere classified, unspecified site: Secondary | ICD-10-CM | POA: Diagnosis not present

## 2020-01-09 DIAGNOSIS — E785 Hyperlipidemia, unspecified: Secondary | ICD-10-CM | POA: Diagnosis not present

## 2020-01-09 DIAGNOSIS — Z96642 Presence of left artificial hip joint: Secondary | ICD-10-CM | POA: Diagnosis not present

## 2020-01-09 DIAGNOSIS — Z9181 History of falling: Secondary | ICD-10-CM | POA: Diagnosis not present

## 2020-01-09 DIAGNOSIS — Z7982 Long term (current) use of aspirin: Secondary | ICD-10-CM | POA: Diagnosis not present

## 2020-01-09 DIAGNOSIS — F0390 Unspecified dementia without behavioral disturbance: Secondary | ICD-10-CM | POA: Diagnosis not present

## 2020-01-09 DIAGNOSIS — Z4800 Encounter for change or removal of nonsurgical wound dressing: Secondary | ICD-10-CM | POA: Diagnosis not present

## 2020-01-09 DIAGNOSIS — L89622 Pressure ulcer of left heel, stage 2: Secondary | ICD-10-CM | POA: Diagnosis not present

## 2020-01-09 DIAGNOSIS — E559 Vitamin D deficiency, unspecified: Secondary | ICD-10-CM | POA: Diagnosis not present

## 2020-01-09 DIAGNOSIS — I2510 Atherosclerotic heart disease of native coronary artery without angina pectoris: Secondary | ICD-10-CM | POA: Diagnosis not present

## 2020-01-09 DIAGNOSIS — I10 Essential (primary) hypertension: Secondary | ICD-10-CM | POA: Diagnosis not present

## 2020-01-12 ENCOUNTER — Ambulatory Visit (INDEPENDENT_AMBULATORY_CARE_PROVIDER_SITE_OTHER): Payer: Medicare Other | Admitting: *Deleted

## 2020-01-12 DIAGNOSIS — I495 Sick sinus syndrome: Secondary | ICD-10-CM

## 2020-01-13 DIAGNOSIS — F0390 Unspecified dementia without behavioral disturbance: Secondary | ICD-10-CM | POA: Diagnosis not present

## 2020-01-13 DIAGNOSIS — I495 Sick sinus syndrome: Secondary | ICD-10-CM | POA: Diagnosis not present

## 2020-01-13 DIAGNOSIS — Z4800 Encounter for change or removal of nonsurgical wound dressing: Secondary | ICD-10-CM | POA: Diagnosis not present

## 2020-01-13 DIAGNOSIS — Z950 Presence of cardiac pacemaker: Secondary | ICD-10-CM | POA: Diagnosis not present

## 2020-01-13 DIAGNOSIS — Z9181 History of falling: Secondary | ICD-10-CM | POA: Diagnosis not present

## 2020-01-13 DIAGNOSIS — L89622 Pressure ulcer of left heel, stage 2: Secondary | ICD-10-CM | POA: Diagnosis not present

## 2020-01-13 DIAGNOSIS — I2510 Atherosclerotic heart disease of native coronary artery without angina pectoris: Secondary | ICD-10-CM | POA: Diagnosis not present

## 2020-01-13 DIAGNOSIS — I10 Essential (primary) hypertension: Secondary | ICD-10-CM | POA: Diagnosis not present

## 2020-01-13 DIAGNOSIS — R55 Syncope and collapse: Secondary | ICD-10-CM | POA: Diagnosis not present

## 2020-01-13 DIAGNOSIS — Z7982 Long term (current) use of aspirin: Secondary | ICD-10-CM | POA: Diagnosis not present

## 2020-01-13 DIAGNOSIS — E785 Hyperlipidemia, unspecified: Secondary | ICD-10-CM | POA: Diagnosis not present

## 2020-01-13 DIAGNOSIS — M6250 Muscle wasting and atrophy, not elsewhere classified, unspecified site: Secondary | ICD-10-CM | POA: Diagnosis not present

## 2020-01-13 DIAGNOSIS — Z96642 Presence of left artificial hip joint: Secondary | ICD-10-CM | POA: Diagnosis not present

## 2020-01-13 DIAGNOSIS — E559 Vitamin D deficiency, unspecified: Secondary | ICD-10-CM | POA: Diagnosis not present

## 2020-01-13 LAB — CUP PACEART REMOTE DEVICE CHECK
Battery Remaining Longevity: 132 mo
Battery Remaining Percentage: 95.5 %
Battery Voltage: 2.96 V
Brady Statistic AP VP Percent: 1 %
Brady Statistic AP VS Percent: 10 %
Brady Statistic AS VP Percent: 1 %
Brady Statistic AS VS Percent: 89 %
Brady Statistic RA Percent Paced: 9.7 %
Brady Statistic RV Percent Paced: 1 %
Date Time Interrogation Session: 20210614020024
Implantable Lead Implant Date: 20140414
Implantable Lead Implant Date: 20140414
Implantable Lead Location: 753859
Implantable Lead Location: 753860
Implantable Pulse Generator Implant Date: 20140414
Lead Channel Impedance Value: 380 Ohm
Lead Channel Impedance Value: 390 Ohm
Lead Channel Pacing Threshold Amplitude: 0.5 V
Lead Channel Pacing Threshold Amplitude: 0.75 V
Lead Channel Pacing Threshold Pulse Width: 0.4 ms
Lead Channel Pacing Threshold Pulse Width: 0.4 ms
Lead Channel Sensing Intrinsic Amplitude: 1.6 mV
Lead Channel Sensing Intrinsic Amplitude: 3.2 mV
Lead Channel Setting Pacing Amplitude: 1 V
Lead Channel Setting Pacing Amplitude: 1.5 V
Lead Channel Setting Pacing Pulse Width: 0.4 ms
Lead Channel Setting Sensing Sensitivity: 2 mV
Pulse Gen Model: 2210
Pulse Gen Serial Number: 7437302

## 2020-01-13 NOTE — Progress Notes (Signed)
Remote pacemaker transmission.   

## 2020-01-14 DIAGNOSIS — G894 Chronic pain syndrome: Secondary | ICD-10-CM | POA: Diagnosis not present

## 2020-01-14 DIAGNOSIS — G301 Alzheimer's disease with late onset: Secondary | ICD-10-CM | POA: Diagnosis not present

## 2020-01-16 DIAGNOSIS — L89622 Pressure ulcer of left heel, stage 2: Secondary | ICD-10-CM | POA: Diagnosis not present

## 2020-01-16 DIAGNOSIS — I2510 Atherosclerotic heart disease of native coronary artery without angina pectoris: Secondary | ICD-10-CM | POA: Diagnosis not present

## 2020-01-16 DIAGNOSIS — I10 Essential (primary) hypertension: Secondary | ICD-10-CM | POA: Diagnosis not present

## 2020-01-16 DIAGNOSIS — F0390 Unspecified dementia without behavioral disturbance: Secondary | ICD-10-CM | POA: Diagnosis not present

## 2020-01-16 DIAGNOSIS — E559 Vitamin D deficiency, unspecified: Secondary | ICD-10-CM | POA: Diagnosis not present

## 2020-01-16 DIAGNOSIS — Z96642 Presence of left artificial hip joint: Secondary | ICD-10-CM | POA: Diagnosis not present

## 2020-01-16 DIAGNOSIS — Z4800 Encounter for change or removal of nonsurgical wound dressing: Secondary | ICD-10-CM | POA: Diagnosis not present

## 2020-01-16 DIAGNOSIS — M6250 Muscle wasting and atrophy, not elsewhere classified, unspecified site: Secondary | ICD-10-CM | POA: Diagnosis not present

## 2020-01-16 DIAGNOSIS — E785 Hyperlipidemia, unspecified: Secondary | ICD-10-CM | POA: Diagnosis not present

## 2020-01-16 DIAGNOSIS — R55 Syncope and collapse: Secondary | ICD-10-CM | POA: Diagnosis not present

## 2020-01-16 DIAGNOSIS — I495 Sick sinus syndrome: Secondary | ICD-10-CM | POA: Diagnosis not present

## 2020-01-16 DIAGNOSIS — Z7982 Long term (current) use of aspirin: Secondary | ICD-10-CM | POA: Diagnosis not present

## 2020-01-16 DIAGNOSIS — Z950 Presence of cardiac pacemaker: Secondary | ICD-10-CM | POA: Diagnosis not present

## 2020-01-16 DIAGNOSIS — Z9181 History of falling: Secondary | ICD-10-CM | POA: Diagnosis not present

## 2020-01-19 DIAGNOSIS — I495 Sick sinus syndrome: Secondary | ICD-10-CM | POA: Diagnosis not present

## 2020-01-19 DIAGNOSIS — Z4800 Encounter for change or removal of nonsurgical wound dressing: Secondary | ICD-10-CM | POA: Diagnosis not present

## 2020-01-19 DIAGNOSIS — L89622 Pressure ulcer of left heel, stage 2: Secondary | ICD-10-CM | POA: Diagnosis not present

## 2020-01-20 DIAGNOSIS — E559 Vitamin D deficiency, unspecified: Secondary | ICD-10-CM | POA: Diagnosis not present

## 2020-01-20 DIAGNOSIS — I495 Sick sinus syndrome: Secondary | ICD-10-CM | POA: Diagnosis not present

## 2020-01-20 DIAGNOSIS — I2510 Atherosclerotic heart disease of native coronary artery without angina pectoris: Secondary | ICD-10-CM | POA: Diagnosis not present

## 2020-01-20 DIAGNOSIS — I10 Essential (primary) hypertension: Secondary | ICD-10-CM | POA: Diagnosis not present

## 2020-01-20 DIAGNOSIS — Z4800 Encounter for change or removal of nonsurgical wound dressing: Secondary | ICD-10-CM | POA: Diagnosis not present

## 2020-01-20 DIAGNOSIS — M6250 Muscle wasting and atrophy, not elsewhere classified, unspecified site: Secondary | ICD-10-CM | POA: Diagnosis not present

## 2020-01-20 DIAGNOSIS — Z9181 History of falling: Secondary | ICD-10-CM | POA: Diagnosis not present

## 2020-01-20 DIAGNOSIS — R55 Syncope and collapse: Secondary | ICD-10-CM | POA: Diagnosis not present

## 2020-01-20 DIAGNOSIS — Z7982 Long term (current) use of aspirin: Secondary | ICD-10-CM | POA: Diagnosis not present

## 2020-01-20 DIAGNOSIS — Z96642 Presence of left artificial hip joint: Secondary | ICD-10-CM | POA: Diagnosis not present

## 2020-01-20 DIAGNOSIS — L89622 Pressure ulcer of left heel, stage 2: Secondary | ICD-10-CM | POA: Diagnosis not present

## 2020-01-20 DIAGNOSIS — F0390 Unspecified dementia without behavioral disturbance: Secondary | ICD-10-CM | POA: Diagnosis not present

## 2020-01-20 DIAGNOSIS — E785 Hyperlipidemia, unspecified: Secondary | ICD-10-CM | POA: Diagnosis not present

## 2020-01-20 DIAGNOSIS — Z950 Presence of cardiac pacemaker: Secondary | ICD-10-CM | POA: Diagnosis not present

## 2020-01-23 DIAGNOSIS — E785 Hyperlipidemia, unspecified: Secondary | ICD-10-CM | POA: Diagnosis not present

## 2020-01-23 DIAGNOSIS — I2510 Atherosclerotic heart disease of native coronary artery without angina pectoris: Secondary | ICD-10-CM | POA: Diagnosis not present

## 2020-01-23 DIAGNOSIS — L89622 Pressure ulcer of left heel, stage 2: Secondary | ICD-10-CM | POA: Diagnosis not present

## 2020-01-23 DIAGNOSIS — I495 Sick sinus syndrome: Secondary | ICD-10-CM | POA: Diagnosis not present

## 2020-01-23 DIAGNOSIS — M6250 Muscle wasting and atrophy, not elsewhere classified, unspecified site: Secondary | ICD-10-CM | POA: Diagnosis not present

## 2020-01-23 DIAGNOSIS — Z950 Presence of cardiac pacemaker: Secondary | ICD-10-CM | POA: Diagnosis not present

## 2020-01-23 DIAGNOSIS — E559 Vitamin D deficiency, unspecified: Secondary | ICD-10-CM | POA: Diagnosis not present

## 2020-01-23 DIAGNOSIS — Z7982 Long term (current) use of aspirin: Secondary | ICD-10-CM | POA: Diagnosis not present

## 2020-01-23 DIAGNOSIS — Z4800 Encounter for change or removal of nonsurgical wound dressing: Secondary | ICD-10-CM | POA: Diagnosis not present

## 2020-01-23 DIAGNOSIS — R55 Syncope and collapse: Secondary | ICD-10-CM | POA: Diagnosis not present

## 2020-01-23 DIAGNOSIS — F0390 Unspecified dementia without behavioral disturbance: Secondary | ICD-10-CM | POA: Diagnosis not present

## 2020-01-23 DIAGNOSIS — Z9181 History of falling: Secondary | ICD-10-CM | POA: Diagnosis not present

## 2020-01-23 DIAGNOSIS — Z96642 Presence of left artificial hip joint: Secondary | ICD-10-CM | POA: Diagnosis not present

## 2020-01-23 DIAGNOSIS — I10 Essential (primary) hypertension: Secondary | ICD-10-CM | POA: Diagnosis not present

## 2020-01-27 DIAGNOSIS — I10 Essential (primary) hypertension: Secondary | ICD-10-CM | POA: Diagnosis not present

## 2020-01-27 DIAGNOSIS — L89622 Pressure ulcer of left heel, stage 2: Secondary | ICD-10-CM | POA: Diagnosis not present

## 2020-01-27 DIAGNOSIS — Z96642 Presence of left artificial hip joint: Secondary | ICD-10-CM | POA: Diagnosis not present

## 2020-01-27 DIAGNOSIS — Z950 Presence of cardiac pacemaker: Secondary | ICD-10-CM | POA: Diagnosis not present

## 2020-01-27 DIAGNOSIS — F0390 Unspecified dementia without behavioral disturbance: Secondary | ICD-10-CM | POA: Diagnosis not present

## 2020-01-27 DIAGNOSIS — Z9181 History of falling: Secondary | ICD-10-CM | POA: Diagnosis not present

## 2020-01-27 DIAGNOSIS — Z7982 Long term (current) use of aspirin: Secondary | ICD-10-CM | POA: Diagnosis not present

## 2020-01-27 DIAGNOSIS — Z4800 Encounter for change or removal of nonsurgical wound dressing: Secondary | ICD-10-CM | POA: Diagnosis not present

## 2020-01-27 DIAGNOSIS — I2510 Atherosclerotic heart disease of native coronary artery without angina pectoris: Secondary | ICD-10-CM | POA: Diagnosis not present

## 2020-01-27 DIAGNOSIS — R55 Syncope and collapse: Secondary | ICD-10-CM | POA: Diagnosis not present

## 2020-01-27 DIAGNOSIS — M6250 Muscle wasting and atrophy, not elsewhere classified, unspecified site: Secondary | ICD-10-CM | POA: Diagnosis not present

## 2020-01-27 DIAGNOSIS — E785 Hyperlipidemia, unspecified: Secondary | ICD-10-CM | POA: Diagnosis not present

## 2020-01-27 DIAGNOSIS — E559 Vitamin D deficiency, unspecified: Secondary | ICD-10-CM | POA: Diagnosis not present

## 2020-01-27 DIAGNOSIS — I495 Sick sinus syndrome: Secondary | ICD-10-CM | POA: Diagnosis not present

## 2020-01-29 DIAGNOSIS — E559 Vitamin D deficiency, unspecified: Secondary | ICD-10-CM | POA: Diagnosis not present

## 2020-01-29 DIAGNOSIS — Z9181 History of falling: Secondary | ICD-10-CM | POA: Diagnosis not present

## 2020-01-29 DIAGNOSIS — Z950 Presence of cardiac pacemaker: Secondary | ICD-10-CM | POA: Diagnosis not present

## 2020-01-29 DIAGNOSIS — Z7982 Long term (current) use of aspirin: Secondary | ICD-10-CM | POA: Diagnosis not present

## 2020-01-29 DIAGNOSIS — I2510 Atherosclerotic heart disease of native coronary artery without angina pectoris: Secondary | ICD-10-CM | POA: Diagnosis not present

## 2020-01-29 DIAGNOSIS — I10 Essential (primary) hypertension: Secondary | ICD-10-CM | POA: Diagnosis not present

## 2020-01-29 DIAGNOSIS — Z4800 Encounter for change or removal of nonsurgical wound dressing: Secondary | ICD-10-CM | POA: Diagnosis not present

## 2020-01-29 DIAGNOSIS — I495 Sick sinus syndrome: Secondary | ICD-10-CM | POA: Diagnosis not present

## 2020-01-29 DIAGNOSIS — E785 Hyperlipidemia, unspecified: Secondary | ICD-10-CM | POA: Diagnosis not present

## 2020-01-29 DIAGNOSIS — Z96642 Presence of left artificial hip joint: Secondary | ICD-10-CM | POA: Diagnosis not present

## 2020-01-29 DIAGNOSIS — F0390 Unspecified dementia without behavioral disturbance: Secondary | ICD-10-CM | POA: Diagnosis not present

## 2020-01-29 DIAGNOSIS — L89622 Pressure ulcer of left heel, stage 2: Secondary | ICD-10-CM | POA: Diagnosis not present

## 2020-01-29 DIAGNOSIS — M6250 Muscle wasting and atrophy, not elsewhere classified, unspecified site: Secondary | ICD-10-CM | POA: Diagnosis not present

## 2020-02-03 DIAGNOSIS — L89622 Pressure ulcer of left heel, stage 2: Secondary | ICD-10-CM | POA: Diagnosis not present

## 2020-02-03 DIAGNOSIS — Z9181 History of falling: Secondary | ICD-10-CM | POA: Diagnosis not present

## 2020-02-03 DIAGNOSIS — F0390 Unspecified dementia without behavioral disturbance: Secondary | ICD-10-CM | POA: Diagnosis not present

## 2020-02-03 DIAGNOSIS — Z7982 Long term (current) use of aspirin: Secondary | ICD-10-CM | POA: Diagnosis not present

## 2020-02-03 DIAGNOSIS — I10 Essential (primary) hypertension: Secondary | ICD-10-CM | POA: Diagnosis not present

## 2020-02-03 DIAGNOSIS — I495 Sick sinus syndrome: Secondary | ICD-10-CM | POA: Diagnosis not present

## 2020-02-03 DIAGNOSIS — I2510 Atherosclerotic heart disease of native coronary artery without angina pectoris: Secondary | ICD-10-CM | POA: Diagnosis not present

## 2020-02-03 DIAGNOSIS — E785 Hyperlipidemia, unspecified: Secondary | ICD-10-CM | POA: Diagnosis not present

## 2020-02-03 DIAGNOSIS — Z96642 Presence of left artificial hip joint: Secondary | ICD-10-CM | POA: Diagnosis not present

## 2020-02-03 DIAGNOSIS — E559 Vitamin D deficiency, unspecified: Secondary | ICD-10-CM | POA: Diagnosis not present

## 2020-02-03 DIAGNOSIS — Z950 Presence of cardiac pacemaker: Secondary | ICD-10-CM | POA: Diagnosis not present

## 2020-02-03 DIAGNOSIS — Z4800 Encounter for change or removal of nonsurgical wound dressing: Secondary | ICD-10-CM | POA: Diagnosis not present

## 2020-02-03 DIAGNOSIS — M6250 Muscle wasting and atrophy, not elsewhere classified, unspecified site: Secondary | ICD-10-CM | POA: Diagnosis not present

## 2020-02-04 DIAGNOSIS — E559 Vitamin D deficiency, unspecified: Secondary | ICD-10-CM | POA: Diagnosis not present

## 2020-02-04 DIAGNOSIS — I10 Essential (primary) hypertension: Secondary | ICD-10-CM | POA: Diagnosis not present

## 2020-02-04 DIAGNOSIS — G301 Alzheimer's disease with late onset: Secondary | ICD-10-CM | POA: Diagnosis not present

## 2020-02-06 DIAGNOSIS — I10 Essential (primary) hypertension: Secondary | ICD-10-CM | POA: Diagnosis not present

## 2020-02-06 DIAGNOSIS — Z4800 Encounter for change or removal of nonsurgical wound dressing: Secondary | ICD-10-CM | POA: Diagnosis not present

## 2020-02-06 DIAGNOSIS — Z9181 History of falling: Secondary | ICD-10-CM | POA: Diagnosis not present

## 2020-02-06 DIAGNOSIS — I495 Sick sinus syndrome: Secondary | ICD-10-CM | POA: Diagnosis not present

## 2020-02-06 DIAGNOSIS — Z96642 Presence of left artificial hip joint: Secondary | ICD-10-CM | POA: Diagnosis not present

## 2020-02-06 DIAGNOSIS — M6250 Muscle wasting and atrophy, not elsewhere classified, unspecified site: Secondary | ICD-10-CM | POA: Diagnosis not present

## 2020-02-06 DIAGNOSIS — Z950 Presence of cardiac pacemaker: Secondary | ICD-10-CM | POA: Diagnosis not present

## 2020-02-06 DIAGNOSIS — E559 Vitamin D deficiency, unspecified: Secondary | ICD-10-CM | POA: Diagnosis not present

## 2020-02-06 DIAGNOSIS — Z7982 Long term (current) use of aspirin: Secondary | ICD-10-CM | POA: Diagnosis not present

## 2020-02-06 DIAGNOSIS — F0390 Unspecified dementia without behavioral disturbance: Secondary | ICD-10-CM | POA: Diagnosis not present

## 2020-02-06 DIAGNOSIS — L89622 Pressure ulcer of left heel, stage 2: Secondary | ICD-10-CM | POA: Diagnosis not present

## 2020-02-06 DIAGNOSIS — E785 Hyperlipidemia, unspecified: Secondary | ICD-10-CM | POA: Diagnosis not present

## 2020-02-06 DIAGNOSIS — I2510 Atherosclerotic heart disease of native coronary artery without angina pectoris: Secondary | ICD-10-CM | POA: Diagnosis not present

## 2020-02-09 DIAGNOSIS — I10 Essential (primary) hypertension: Secondary | ICD-10-CM | POA: Diagnosis not present

## 2020-02-09 DIAGNOSIS — Z9181 History of falling: Secondary | ICD-10-CM | POA: Diagnosis not present

## 2020-02-09 DIAGNOSIS — E559 Vitamin D deficiency, unspecified: Secondary | ICD-10-CM | POA: Diagnosis not present

## 2020-02-09 DIAGNOSIS — Z4800 Encounter for change or removal of nonsurgical wound dressing: Secondary | ICD-10-CM | POA: Diagnosis not present

## 2020-02-09 DIAGNOSIS — I495 Sick sinus syndrome: Secondary | ICD-10-CM | POA: Diagnosis not present

## 2020-02-09 DIAGNOSIS — M6250 Muscle wasting and atrophy, not elsewhere classified, unspecified site: Secondary | ICD-10-CM | POA: Diagnosis not present

## 2020-02-09 DIAGNOSIS — E785 Hyperlipidemia, unspecified: Secondary | ICD-10-CM | POA: Diagnosis not present

## 2020-02-09 DIAGNOSIS — L89622 Pressure ulcer of left heel, stage 2: Secondary | ICD-10-CM | POA: Diagnosis not present

## 2020-02-09 DIAGNOSIS — F0390 Unspecified dementia without behavioral disturbance: Secondary | ICD-10-CM | POA: Diagnosis not present

## 2020-02-09 DIAGNOSIS — Z7982 Long term (current) use of aspirin: Secondary | ICD-10-CM | POA: Diagnosis not present

## 2020-02-09 DIAGNOSIS — I2510 Atherosclerotic heart disease of native coronary artery without angina pectoris: Secondary | ICD-10-CM | POA: Diagnosis not present

## 2020-02-09 DIAGNOSIS — Z96642 Presence of left artificial hip joint: Secondary | ICD-10-CM | POA: Diagnosis not present

## 2020-02-09 DIAGNOSIS — Z950 Presence of cardiac pacemaker: Secondary | ICD-10-CM | POA: Diagnosis not present

## 2020-02-12 DIAGNOSIS — Z96642 Presence of left artificial hip joint: Secondary | ICD-10-CM | POA: Diagnosis not present

## 2020-02-12 DIAGNOSIS — Z4800 Encounter for change or removal of nonsurgical wound dressing: Secondary | ICD-10-CM | POA: Diagnosis not present

## 2020-02-12 DIAGNOSIS — L89622 Pressure ulcer of left heel, stage 2: Secondary | ICD-10-CM | POA: Diagnosis not present

## 2020-02-12 DIAGNOSIS — I10 Essential (primary) hypertension: Secondary | ICD-10-CM | POA: Diagnosis not present

## 2020-02-12 DIAGNOSIS — Z950 Presence of cardiac pacemaker: Secondary | ICD-10-CM | POA: Diagnosis not present

## 2020-02-12 DIAGNOSIS — I2510 Atherosclerotic heart disease of native coronary artery without angina pectoris: Secondary | ICD-10-CM | POA: Diagnosis not present

## 2020-02-12 DIAGNOSIS — Z7982 Long term (current) use of aspirin: Secondary | ICD-10-CM | POA: Diagnosis not present

## 2020-02-12 DIAGNOSIS — F0390 Unspecified dementia without behavioral disturbance: Secondary | ICD-10-CM | POA: Diagnosis not present

## 2020-02-12 DIAGNOSIS — Z9181 History of falling: Secondary | ICD-10-CM | POA: Diagnosis not present

## 2020-02-12 DIAGNOSIS — M6250 Muscle wasting and atrophy, not elsewhere classified, unspecified site: Secondary | ICD-10-CM | POA: Diagnosis not present

## 2020-02-12 DIAGNOSIS — E559 Vitamin D deficiency, unspecified: Secondary | ICD-10-CM | POA: Diagnosis not present

## 2020-02-12 DIAGNOSIS — E785 Hyperlipidemia, unspecified: Secondary | ICD-10-CM | POA: Diagnosis not present

## 2020-02-12 DIAGNOSIS — I495 Sick sinus syndrome: Secondary | ICD-10-CM | POA: Diagnosis not present

## 2020-02-17 DIAGNOSIS — E559 Vitamin D deficiency, unspecified: Secondary | ICD-10-CM | POA: Diagnosis not present

## 2020-02-17 DIAGNOSIS — Z950 Presence of cardiac pacemaker: Secondary | ICD-10-CM | POA: Diagnosis not present

## 2020-02-17 DIAGNOSIS — Z9181 History of falling: Secondary | ICD-10-CM | POA: Diagnosis not present

## 2020-02-17 DIAGNOSIS — I10 Essential (primary) hypertension: Secondary | ICD-10-CM | POA: Diagnosis not present

## 2020-02-17 DIAGNOSIS — I495 Sick sinus syndrome: Secondary | ICD-10-CM | POA: Diagnosis not present

## 2020-02-17 DIAGNOSIS — F0390 Unspecified dementia without behavioral disturbance: Secondary | ICD-10-CM | POA: Diagnosis not present

## 2020-02-17 DIAGNOSIS — I2510 Atherosclerotic heart disease of native coronary artery without angina pectoris: Secondary | ICD-10-CM | POA: Diagnosis not present

## 2020-02-17 DIAGNOSIS — Z7982 Long term (current) use of aspirin: Secondary | ICD-10-CM | POA: Diagnosis not present

## 2020-02-17 DIAGNOSIS — M6250 Muscle wasting and atrophy, not elsewhere classified, unspecified site: Secondary | ICD-10-CM | POA: Diagnosis not present

## 2020-02-17 DIAGNOSIS — Z96642 Presence of left artificial hip joint: Secondary | ICD-10-CM | POA: Diagnosis not present

## 2020-02-17 DIAGNOSIS — L89622 Pressure ulcer of left heel, stage 2: Secondary | ICD-10-CM | POA: Diagnosis not present

## 2020-02-17 DIAGNOSIS — Z4800 Encounter for change or removal of nonsurgical wound dressing: Secondary | ICD-10-CM | POA: Diagnosis not present

## 2020-02-17 DIAGNOSIS — E785 Hyperlipidemia, unspecified: Secondary | ICD-10-CM | POA: Diagnosis not present

## 2020-02-18 DIAGNOSIS — G301 Alzheimer's disease with late onset: Secondary | ICD-10-CM | POA: Diagnosis not present

## 2020-02-18 DIAGNOSIS — G894 Chronic pain syndrome: Secondary | ICD-10-CM | POA: Diagnosis not present

## 2020-02-19 DIAGNOSIS — I2510 Atherosclerotic heart disease of native coronary artery without angina pectoris: Secondary | ICD-10-CM | POA: Diagnosis not present

## 2020-02-19 DIAGNOSIS — Z4800 Encounter for change or removal of nonsurgical wound dressing: Secondary | ICD-10-CM | POA: Diagnosis not present

## 2020-02-19 DIAGNOSIS — M6250 Muscle wasting and atrophy, not elsewhere classified, unspecified site: Secondary | ICD-10-CM | POA: Diagnosis not present

## 2020-02-19 DIAGNOSIS — Z96642 Presence of left artificial hip joint: Secondary | ICD-10-CM | POA: Diagnosis not present

## 2020-02-19 DIAGNOSIS — Z9181 History of falling: Secondary | ICD-10-CM | POA: Diagnosis not present

## 2020-02-19 DIAGNOSIS — I10 Essential (primary) hypertension: Secondary | ICD-10-CM | POA: Diagnosis not present

## 2020-02-19 DIAGNOSIS — L89622 Pressure ulcer of left heel, stage 2: Secondary | ICD-10-CM | POA: Diagnosis not present

## 2020-02-19 DIAGNOSIS — I495 Sick sinus syndrome: Secondary | ICD-10-CM | POA: Diagnosis not present

## 2020-02-19 DIAGNOSIS — F0390 Unspecified dementia without behavioral disturbance: Secondary | ICD-10-CM | POA: Diagnosis not present

## 2020-02-19 DIAGNOSIS — Z7982 Long term (current) use of aspirin: Secondary | ICD-10-CM | POA: Diagnosis not present

## 2020-02-19 DIAGNOSIS — E785 Hyperlipidemia, unspecified: Secondary | ICD-10-CM | POA: Diagnosis not present

## 2020-02-19 DIAGNOSIS — E559 Vitamin D deficiency, unspecified: Secondary | ICD-10-CM | POA: Diagnosis not present

## 2020-02-19 DIAGNOSIS — Z950 Presence of cardiac pacemaker: Secondary | ICD-10-CM | POA: Diagnosis not present

## 2020-02-20 DIAGNOSIS — E559 Vitamin D deficiency, unspecified: Secondary | ICD-10-CM | POA: Diagnosis not present

## 2020-02-20 DIAGNOSIS — I10 Essential (primary) hypertension: Secondary | ICD-10-CM | POA: Diagnosis not present

## 2020-02-23 DIAGNOSIS — F0390 Unspecified dementia without behavioral disturbance: Secondary | ICD-10-CM | POA: Diagnosis not present

## 2020-02-23 DIAGNOSIS — M6250 Muscle wasting and atrophy, not elsewhere classified, unspecified site: Secondary | ICD-10-CM | POA: Diagnosis not present

## 2020-02-23 DIAGNOSIS — I2510 Atherosclerotic heart disease of native coronary artery without angina pectoris: Secondary | ICD-10-CM | POA: Diagnosis not present

## 2020-02-23 DIAGNOSIS — Z4800 Encounter for change or removal of nonsurgical wound dressing: Secondary | ICD-10-CM | POA: Diagnosis not present

## 2020-02-23 DIAGNOSIS — E785 Hyperlipidemia, unspecified: Secondary | ICD-10-CM | POA: Diagnosis not present

## 2020-02-23 DIAGNOSIS — Z7982 Long term (current) use of aspirin: Secondary | ICD-10-CM | POA: Diagnosis not present

## 2020-02-23 DIAGNOSIS — Z9181 History of falling: Secondary | ICD-10-CM | POA: Diagnosis not present

## 2020-02-23 DIAGNOSIS — L89622 Pressure ulcer of left heel, stage 2: Secondary | ICD-10-CM | POA: Diagnosis not present

## 2020-02-23 DIAGNOSIS — E559 Vitamin D deficiency, unspecified: Secondary | ICD-10-CM | POA: Diagnosis not present

## 2020-02-23 DIAGNOSIS — Z96642 Presence of left artificial hip joint: Secondary | ICD-10-CM | POA: Diagnosis not present

## 2020-02-23 DIAGNOSIS — I495 Sick sinus syndrome: Secondary | ICD-10-CM | POA: Diagnosis not present

## 2020-02-23 DIAGNOSIS — I10 Essential (primary) hypertension: Secondary | ICD-10-CM | POA: Diagnosis not present

## 2020-02-23 DIAGNOSIS — Z950 Presence of cardiac pacemaker: Secondary | ICD-10-CM | POA: Diagnosis not present

## 2020-02-24 DIAGNOSIS — I495 Sick sinus syndrome: Secondary | ICD-10-CM | POA: Diagnosis not present

## 2020-02-24 DIAGNOSIS — I2510 Atherosclerotic heart disease of native coronary artery without angina pectoris: Secondary | ICD-10-CM | POA: Diagnosis not present

## 2020-02-24 DIAGNOSIS — E559 Vitamin D deficiency, unspecified: Secondary | ICD-10-CM | POA: Diagnosis not present

## 2020-02-24 DIAGNOSIS — F0390 Unspecified dementia without behavioral disturbance: Secondary | ICD-10-CM | POA: Diagnosis not present

## 2020-02-24 DIAGNOSIS — Z9181 History of falling: Secondary | ICD-10-CM | POA: Diagnosis not present

## 2020-02-24 DIAGNOSIS — Z950 Presence of cardiac pacemaker: Secondary | ICD-10-CM | POA: Diagnosis not present

## 2020-02-24 DIAGNOSIS — M6250 Muscle wasting and atrophy, not elsewhere classified, unspecified site: Secondary | ICD-10-CM | POA: Diagnosis not present

## 2020-02-24 DIAGNOSIS — Z7982 Long term (current) use of aspirin: Secondary | ICD-10-CM | POA: Diagnosis not present

## 2020-02-24 DIAGNOSIS — Z96642 Presence of left artificial hip joint: Secondary | ICD-10-CM | POA: Diagnosis not present

## 2020-02-24 DIAGNOSIS — Z4800 Encounter for change or removal of nonsurgical wound dressing: Secondary | ICD-10-CM | POA: Diagnosis not present

## 2020-02-24 DIAGNOSIS — E785 Hyperlipidemia, unspecified: Secondary | ICD-10-CM | POA: Diagnosis not present

## 2020-02-24 DIAGNOSIS — I10 Essential (primary) hypertension: Secondary | ICD-10-CM | POA: Diagnosis not present

## 2020-02-24 DIAGNOSIS — L89622 Pressure ulcer of left heel, stage 2: Secondary | ICD-10-CM | POA: Diagnosis not present

## 2020-02-26 DIAGNOSIS — Z96642 Presence of left artificial hip joint: Secondary | ICD-10-CM | POA: Diagnosis not present

## 2020-02-26 DIAGNOSIS — I495 Sick sinus syndrome: Secondary | ICD-10-CM | POA: Diagnosis not present

## 2020-02-26 DIAGNOSIS — E559 Vitamin D deficiency, unspecified: Secondary | ICD-10-CM | POA: Diagnosis not present

## 2020-02-26 DIAGNOSIS — Z950 Presence of cardiac pacemaker: Secondary | ICD-10-CM | POA: Diagnosis not present

## 2020-02-26 DIAGNOSIS — E785 Hyperlipidemia, unspecified: Secondary | ICD-10-CM | POA: Diagnosis not present

## 2020-02-26 DIAGNOSIS — I2510 Atherosclerotic heart disease of native coronary artery without angina pectoris: Secondary | ICD-10-CM | POA: Diagnosis not present

## 2020-02-26 DIAGNOSIS — F0390 Unspecified dementia without behavioral disturbance: Secondary | ICD-10-CM | POA: Diagnosis not present

## 2020-02-26 DIAGNOSIS — L89622 Pressure ulcer of left heel, stage 2: Secondary | ICD-10-CM | POA: Diagnosis not present

## 2020-02-26 DIAGNOSIS — M6250 Muscle wasting and atrophy, not elsewhere classified, unspecified site: Secondary | ICD-10-CM | POA: Diagnosis not present

## 2020-02-26 DIAGNOSIS — Z4800 Encounter for change or removal of nonsurgical wound dressing: Secondary | ICD-10-CM | POA: Diagnosis not present

## 2020-02-26 DIAGNOSIS — I10 Essential (primary) hypertension: Secondary | ICD-10-CM | POA: Diagnosis not present

## 2020-02-26 DIAGNOSIS — Z9181 History of falling: Secondary | ICD-10-CM | POA: Diagnosis not present

## 2020-02-26 DIAGNOSIS — Z7982 Long term (current) use of aspirin: Secondary | ICD-10-CM | POA: Diagnosis not present

## 2020-03-01 DIAGNOSIS — I495 Sick sinus syndrome: Secondary | ICD-10-CM | POA: Diagnosis not present

## 2020-03-01 DIAGNOSIS — I2510 Atherosclerotic heart disease of native coronary artery without angina pectoris: Secondary | ICD-10-CM | POA: Diagnosis not present

## 2020-03-01 DIAGNOSIS — Z96642 Presence of left artificial hip joint: Secondary | ICD-10-CM | POA: Diagnosis not present

## 2020-03-01 DIAGNOSIS — M6250 Muscle wasting and atrophy, not elsewhere classified, unspecified site: Secondary | ICD-10-CM | POA: Diagnosis not present

## 2020-03-01 DIAGNOSIS — E559 Vitamin D deficiency, unspecified: Secondary | ICD-10-CM | POA: Diagnosis not present

## 2020-03-01 DIAGNOSIS — Z950 Presence of cardiac pacemaker: Secondary | ICD-10-CM | POA: Diagnosis not present

## 2020-03-01 DIAGNOSIS — E785 Hyperlipidemia, unspecified: Secondary | ICD-10-CM | POA: Diagnosis not present

## 2020-03-01 DIAGNOSIS — F0390 Unspecified dementia without behavioral disturbance: Secondary | ICD-10-CM | POA: Diagnosis not present

## 2020-03-01 DIAGNOSIS — Z9181 History of falling: Secondary | ICD-10-CM | POA: Diagnosis not present

## 2020-03-01 DIAGNOSIS — Z4800 Encounter for change or removal of nonsurgical wound dressing: Secondary | ICD-10-CM | POA: Diagnosis not present

## 2020-03-01 DIAGNOSIS — I10 Essential (primary) hypertension: Secondary | ICD-10-CM | POA: Diagnosis not present

## 2020-03-01 DIAGNOSIS — L89622 Pressure ulcer of left heel, stage 2: Secondary | ICD-10-CM | POA: Diagnosis not present

## 2020-03-01 DIAGNOSIS — Z7982 Long term (current) use of aspirin: Secondary | ICD-10-CM | POA: Diagnosis not present

## 2020-03-02 DIAGNOSIS — I10 Essential (primary) hypertension: Secondary | ICD-10-CM | POA: Diagnosis not present

## 2020-03-02 DIAGNOSIS — F0390 Unspecified dementia without behavioral disturbance: Secondary | ICD-10-CM | POA: Diagnosis not present

## 2020-03-02 DIAGNOSIS — Z4800 Encounter for change or removal of nonsurgical wound dressing: Secondary | ICD-10-CM | POA: Diagnosis not present

## 2020-03-02 DIAGNOSIS — Z96642 Presence of left artificial hip joint: Secondary | ICD-10-CM | POA: Diagnosis not present

## 2020-03-02 DIAGNOSIS — M6250 Muscle wasting and atrophy, not elsewhere classified, unspecified site: Secondary | ICD-10-CM | POA: Diagnosis not present

## 2020-03-02 DIAGNOSIS — Z7982 Long term (current) use of aspirin: Secondary | ICD-10-CM | POA: Diagnosis not present

## 2020-03-02 DIAGNOSIS — I2510 Atherosclerotic heart disease of native coronary artery without angina pectoris: Secondary | ICD-10-CM | POA: Diagnosis not present

## 2020-03-02 DIAGNOSIS — E559 Vitamin D deficiency, unspecified: Secondary | ICD-10-CM | POA: Diagnosis not present

## 2020-03-02 DIAGNOSIS — Z950 Presence of cardiac pacemaker: Secondary | ICD-10-CM | POA: Diagnosis not present

## 2020-03-02 DIAGNOSIS — I495 Sick sinus syndrome: Secondary | ICD-10-CM | POA: Diagnosis not present

## 2020-03-02 DIAGNOSIS — E785 Hyperlipidemia, unspecified: Secondary | ICD-10-CM | POA: Diagnosis not present

## 2020-03-02 DIAGNOSIS — Z9181 History of falling: Secondary | ICD-10-CM | POA: Diagnosis not present

## 2020-03-02 DIAGNOSIS — L89622 Pressure ulcer of left heel, stage 2: Secondary | ICD-10-CM | POA: Diagnosis not present

## 2020-03-03 DIAGNOSIS — E559 Vitamin D deficiency, unspecified: Secondary | ICD-10-CM | POA: Diagnosis not present

## 2020-03-03 DIAGNOSIS — Z9181 History of falling: Secondary | ICD-10-CM | POA: Diagnosis not present

## 2020-03-03 DIAGNOSIS — I10 Essential (primary) hypertension: Secondary | ICD-10-CM | POA: Diagnosis not present

## 2020-03-03 DIAGNOSIS — K5909 Other constipation: Secondary | ICD-10-CM | POA: Diagnosis not present

## 2020-03-04 DIAGNOSIS — Z4800 Encounter for change or removal of nonsurgical wound dressing: Secondary | ICD-10-CM | POA: Diagnosis not present

## 2020-03-04 DIAGNOSIS — Z7982 Long term (current) use of aspirin: Secondary | ICD-10-CM | POA: Diagnosis not present

## 2020-03-04 DIAGNOSIS — Z9181 History of falling: Secondary | ICD-10-CM | POA: Diagnosis not present

## 2020-03-04 DIAGNOSIS — F0390 Unspecified dementia without behavioral disturbance: Secondary | ICD-10-CM | POA: Diagnosis not present

## 2020-03-04 DIAGNOSIS — E785 Hyperlipidemia, unspecified: Secondary | ICD-10-CM | POA: Diagnosis not present

## 2020-03-04 DIAGNOSIS — I2510 Atherosclerotic heart disease of native coronary artery without angina pectoris: Secondary | ICD-10-CM | POA: Diagnosis not present

## 2020-03-04 DIAGNOSIS — L89622 Pressure ulcer of left heel, stage 2: Secondary | ICD-10-CM | POA: Diagnosis not present

## 2020-03-04 DIAGNOSIS — Z96642 Presence of left artificial hip joint: Secondary | ICD-10-CM | POA: Diagnosis not present

## 2020-03-04 DIAGNOSIS — E559 Vitamin D deficiency, unspecified: Secondary | ICD-10-CM | POA: Diagnosis not present

## 2020-03-04 DIAGNOSIS — I495 Sick sinus syndrome: Secondary | ICD-10-CM | POA: Diagnosis not present

## 2020-03-04 DIAGNOSIS — Z950 Presence of cardiac pacemaker: Secondary | ICD-10-CM | POA: Diagnosis not present

## 2020-03-04 DIAGNOSIS — M6250 Muscle wasting and atrophy, not elsewhere classified, unspecified site: Secondary | ICD-10-CM | POA: Diagnosis not present

## 2020-03-04 DIAGNOSIS — I10 Essential (primary) hypertension: Secondary | ICD-10-CM | POA: Diagnosis not present

## 2020-03-08 DIAGNOSIS — Z950 Presence of cardiac pacemaker: Secondary | ICD-10-CM | POA: Diagnosis not present

## 2020-03-08 DIAGNOSIS — E559 Vitamin D deficiency, unspecified: Secondary | ICD-10-CM | POA: Diagnosis not present

## 2020-03-08 DIAGNOSIS — Z4800 Encounter for change or removal of nonsurgical wound dressing: Secondary | ICD-10-CM | POA: Diagnosis not present

## 2020-03-08 DIAGNOSIS — Z9181 History of falling: Secondary | ICD-10-CM | POA: Diagnosis not present

## 2020-03-08 DIAGNOSIS — I2510 Atherosclerotic heart disease of native coronary artery without angina pectoris: Secondary | ICD-10-CM | POA: Diagnosis not present

## 2020-03-08 DIAGNOSIS — I495 Sick sinus syndrome: Secondary | ICD-10-CM | POA: Diagnosis not present

## 2020-03-08 DIAGNOSIS — M6250 Muscle wasting and atrophy, not elsewhere classified, unspecified site: Secondary | ICD-10-CM | POA: Diagnosis not present

## 2020-03-08 DIAGNOSIS — L89622 Pressure ulcer of left heel, stage 2: Secondary | ICD-10-CM | POA: Diagnosis not present

## 2020-03-08 DIAGNOSIS — Z7982 Long term (current) use of aspirin: Secondary | ICD-10-CM | POA: Diagnosis not present

## 2020-03-08 DIAGNOSIS — I10 Essential (primary) hypertension: Secondary | ICD-10-CM | POA: Diagnosis not present

## 2020-03-08 DIAGNOSIS — F0390 Unspecified dementia without behavioral disturbance: Secondary | ICD-10-CM | POA: Diagnosis not present

## 2020-03-08 DIAGNOSIS — Z96642 Presence of left artificial hip joint: Secondary | ICD-10-CM | POA: Diagnosis not present

## 2020-03-08 DIAGNOSIS — E785 Hyperlipidemia, unspecified: Secondary | ICD-10-CM | POA: Diagnosis not present

## 2020-03-11 DIAGNOSIS — I10 Essential (primary) hypertension: Secondary | ICD-10-CM | POA: Diagnosis not present

## 2020-03-11 DIAGNOSIS — M6250 Muscle wasting and atrophy, not elsewhere classified, unspecified site: Secondary | ICD-10-CM | POA: Diagnosis not present

## 2020-03-11 DIAGNOSIS — L89622 Pressure ulcer of left heel, stage 2: Secondary | ICD-10-CM | POA: Diagnosis not present

## 2020-03-11 DIAGNOSIS — I2510 Atherosclerotic heart disease of native coronary artery without angina pectoris: Secondary | ICD-10-CM | POA: Diagnosis not present

## 2020-03-11 DIAGNOSIS — Z96642 Presence of left artificial hip joint: Secondary | ICD-10-CM | POA: Diagnosis not present

## 2020-03-11 DIAGNOSIS — Z9181 History of falling: Secondary | ICD-10-CM | POA: Diagnosis not present

## 2020-03-11 DIAGNOSIS — Z7982 Long term (current) use of aspirin: Secondary | ICD-10-CM | POA: Diagnosis not present

## 2020-03-11 DIAGNOSIS — F0390 Unspecified dementia without behavioral disturbance: Secondary | ICD-10-CM | POA: Diagnosis not present

## 2020-03-11 DIAGNOSIS — I495 Sick sinus syndrome: Secondary | ICD-10-CM | POA: Diagnosis not present

## 2020-03-11 DIAGNOSIS — Z950 Presence of cardiac pacemaker: Secondary | ICD-10-CM | POA: Diagnosis not present

## 2020-03-11 DIAGNOSIS — E559 Vitamin D deficiency, unspecified: Secondary | ICD-10-CM | POA: Diagnosis not present

## 2020-03-11 DIAGNOSIS — Z4800 Encounter for change or removal of nonsurgical wound dressing: Secondary | ICD-10-CM | POA: Diagnosis not present

## 2020-03-11 DIAGNOSIS — E785 Hyperlipidemia, unspecified: Secondary | ICD-10-CM | POA: Diagnosis not present

## 2020-03-15 DIAGNOSIS — Z96642 Presence of left artificial hip joint: Secondary | ICD-10-CM | POA: Diagnosis not present

## 2020-03-15 DIAGNOSIS — F0390 Unspecified dementia without behavioral disturbance: Secondary | ICD-10-CM | POA: Diagnosis not present

## 2020-03-15 DIAGNOSIS — I10 Essential (primary) hypertension: Secondary | ICD-10-CM | POA: Diagnosis not present

## 2020-03-15 DIAGNOSIS — M6250 Muscle wasting and atrophy, not elsewhere classified, unspecified site: Secondary | ICD-10-CM | POA: Diagnosis not present

## 2020-03-15 DIAGNOSIS — E785 Hyperlipidemia, unspecified: Secondary | ICD-10-CM | POA: Diagnosis not present

## 2020-03-15 DIAGNOSIS — Z7982 Long term (current) use of aspirin: Secondary | ICD-10-CM | POA: Diagnosis not present

## 2020-03-15 DIAGNOSIS — E559 Vitamin D deficiency, unspecified: Secondary | ICD-10-CM | POA: Diagnosis not present

## 2020-03-15 DIAGNOSIS — Z4800 Encounter for change or removal of nonsurgical wound dressing: Secondary | ICD-10-CM | POA: Diagnosis not present

## 2020-03-15 DIAGNOSIS — I2510 Atherosclerotic heart disease of native coronary artery without angina pectoris: Secondary | ICD-10-CM | POA: Diagnosis not present

## 2020-03-15 DIAGNOSIS — I495 Sick sinus syndrome: Secondary | ICD-10-CM | POA: Diagnosis not present

## 2020-03-15 DIAGNOSIS — L89622 Pressure ulcer of left heel, stage 2: Secondary | ICD-10-CM | POA: Diagnosis not present

## 2020-03-15 DIAGNOSIS — Z9181 History of falling: Secondary | ICD-10-CM | POA: Diagnosis not present

## 2020-03-15 DIAGNOSIS — Z950 Presence of cardiac pacemaker: Secondary | ICD-10-CM | POA: Diagnosis not present

## 2020-03-17 DIAGNOSIS — L89622 Pressure ulcer of left heel, stage 2: Secondary | ICD-10-CM | POA: Diagnosis not present

## 2020-03-17 DIAGNOSIS — I495 Sick sinus syndrome: Secondary | ICD-10-CM | POA: Diagnosis not present

## 2020-03-17 DIAGNOSIS — I10 Essential (primary) hypertension: Secondary | ICD-10-CM | POA: Diagnosis not present

## 2020-03-17 DIAGNOSIS — Z96642 Presence of left artificial hip joint: Secondary | ICD-10-CM | POA: Diagnosis not present

## 2020-03-17 DIAGNOSIS — M6250 Muscle wasting and atrophy, not elsewhere classified, unspecified site: Secondary | ICD-10-CM | POA: Diagnosis not present

## 2020-03-17 DIAGNOSIS — Z4800 Encounter for change or removal of nonsurgical wound dressing: Secondary | ICD-10-CM | POA: Diagnosis not present

## 2020-03-17 DIAGNOSIS — Z9181 History of falling: Secondary | ICD-10-CM | POA: Diagnosis not present

## 2020-03-17 DIAGNOSIS — F0390 Unspecified dementia without behavioral disturbance: Secondary | ICD-10-CM | POA: Diagnosis not present

## 2020-03-17 DIAGNOSIS — E559 Vitamin D deficiency, unspecified: Secondary | ICD-10-CM | POA: Diagnosis not present

## 2020-03-17 DIAGNOSIS — Z950 Presence of cardiac pacemaker: Secondary | ICD-10-CM | POA: Diagnosis not present

## 2020-03-17 DIAGNOSIS — I2510 Atherosclerotic heart disease of native coronary artery without angina pectoris: Secondary | ICD-10-CM | POA: Diagnosis not present

## 2020-03-17 DIAGNOSIS — Z7982 Long term (current) use of aspirin: Secondary | ICD-10-CM | POA: Diagnosis not present

## 2020-03-17 DIAGNOSIS — E785 Hyperlipidemia, unspecified: Secondary | ICD-10-CM | POA: Diagnosis not present

## 2020-03-18 DIAGNOSIS — L89622 Pressure ulcer of left heel, stage 2: Secondary | ICD-10-CM | POA: Diagnosis not present

## 2020-03-18 DIAGNOSIS — F0390 Unspecified dementia without behavioral disturbance: Secondary | ICD-10-CM | POA: Diagnosis not present

## 2020-03-18 DIAGNOSIS — Z950 Presence of cardiac pacemaker: Secondary | ICD-10-CM | POA: Diagnosis not present

## 2020-03-18 DIAGNOSIS — I2510 Atherosclerotic heart disease of native coronary artery without angina pectoris: Secondary | ICD-10-CM | POA: Diagnosis not present

## 2020-03-18 DIAGNOSIS — Z9181 History of falling: Secondary | ICD-10-CM | POA: Diagnosis not present

## 2020-03-18 DIAGNOSIS — Z4800 Encounter for change or removal of nonsurgical wound dressing: Secondary | ICD-10-CM | POA: Diagnosis not present

## 2020-03-18 DIAGNOSIS — I495 Sick sinus syndrome: Secondary | ICD-10-CM | POA: Diagnosis not present

## 2020-03-18 DIAGNOSIS — E559 Vitamin D deficiency, unspecified: Secondary | ICD-10-CM | POA: Diagnosis not present

## 2020-03-18 DIAGNOSIS — M6250 Muscle wasting and atrophy, not elsewhere classified, unspecified site: Secondary | ICD-10-CM | POA: Diagnosis not present

## 2020-03-18 DIAGNOSIS — Z96642 Presence of left artificial hip joint: Secondary | ICD-10-CM | POA: Diagnosis not present

## 2020-03-18 DIAGNOSIS — Z7982 Long term (current) use of aspirin: Secondary | ICD-10-CM | POA: Diagnosis not present

## 2020-03-18 DIAGNOSIS — I10 Essential (primary) hypertension: Secondary | ICD-10-CM | POA: Diagnosis not present

## 2020-03-18 DIAGNOSIS — E785 Hyperlipidemia, unspecified: Secondary | ICD-10-CM | POA: Diagnosis not present

## 2020-03-22 DIAGNOSIS — I2510 Atherosclerotic heart disease of native coronary artery without angina pectoris: Secondary | ICD-10-CM | POA: Diagnosis not present

## 2020-03-22 DIAGNOSIS — Z950 Presence of cardiac pacemaker: Secondary | ICD-10-CM | POA: Diagnosis not present

## 2020-03-22 DIAGNOSIS — I10 Essential (primary) hypertension: Secondary | ICD-10-CM | POA: Diagnosis not present

## 2020-03-22 DIAGNOSIS — Z96642 Presence of left artificial hip joint: Secondary | ICD-10-CM | POA: Diagnosis not present

## 2020-03-22 DIAGNOSIS — Z7982 Long term (current) use of aspirin: Secondary | ICD-10-CM | POA: Diagnosis not present

## 2020-03-22 DIAGNOSIS — I495 Sick sinus syndrome: Secondary | ICD-10-CM | POA: Diagnosis not present

## 2020-03-22 DIAGNOSIS — Z9181 History of falling: Secondary | ICD-10-CM | POA: Diagnosis not present

## 2020-03-22 DIAGNOSIS — F0390 Unspecified dementia without behavioral disturbance: Secondary | ICD-10-CM | POA: Diagnosis not present

## 2020-03-22 DIAGNOSIS — L89622 Pressure ulcer of left heel, stage 2: Secondary | ICD-10-CM | POA: Diagnosis not present

## 2020-03-22 DIAGNOSIS — M6250 Muscle wasting and atrophy, not elsewhere classified, unspecified site: Secondary | ICD-10-CM | POA: Diagnosis not present

## 2020-03-22 DIAGNOSIS — E785 Hyperlipidemia, unspecified: Secondary | ICD-10-CM | POA: Diagnosis not present

## 2020-03-22 DIAGNOSIS — E559 Vitamin D deficiency, unspecified: Secondary | ICD-10-CM | POA: Diagnosis not present

## 2020-03-22 DIAGNOSIS — Z4800 Encounter for change or removal of nonsurgical wound dressing: Secondary | ICD-10-CM | POA: Diagnosis not present

## 2020-03-23 DIAGNOSIS — Z4800 Encounter for change or removal of nonsurgical wound dressing: Secondary | ICD-10-CM | POA: Diagnosis not present

## 2020-03-23 DIAGNOSIS — E785 Hyperlipidemia, unspecified: Secondary | ICD-10-CM | POA: Diagnosis not present

## 2020-03-23 DIAGNOSIS — Z7982 Long term (current) use of aspirin: Secondary | ICD-10-CM | POA: Diagnosis not present

## 2020-03-23 DIAGNOSIS — I2510 Atherosclerotic heart disease of native coronary artery without angina pectoris: Secondary | ICD-10-CM | POA: Diagnosis not present

## 2020-03-23 DIAGNOSIS — M6250 Muscle wasting and atrophy, not elsewhere classified, unspecified site: Secondary | ICD-10-CM | POA: Diagnosis not present

## 2020-03-23 DIAGNOSIS — I10 Essential (primary) hypertension: Secondary | ICD-10-CM | POA: Diagnosis not present

## 2020-03-23 DIAGNOSIS — F0390 Unspecified dementia without behavioral disturbance: Secondary | ICD-10-CM | POA: Diagnosis not present

## 2020-03-23 DIAGNOSIS — I495 Sick sinus syndrome: Secondary | ICD-10-CM | POA: Diagnosis not present

## 2020-03-23 DIAGNOSIS — E559 Vitamin D deficiency, unspecified: Secondary | ICD-10-CM | POA: Diagnosis not present

## 2020-03-23 DIAGNOSIS — L89622 Pressure ulcer of left heel, stage 2: Secondary | ICD-10-CM | POA: Diagnosis not present

## 2020-03-23 DIAGNOSIS — Z950 Presence of cardiac pacemaker: Secondary | ICD-10-CM | POA: Diagnosis not present

## 2020-03-23 DIAGNOSIS — Z96642 Presence of left artificial hip joint: Secondary | ICD-10-CM | POA: Diagnosis not present

## 2020-03-23 DIAGNOSIS — Z9181 History of falling: Secondary | ICD-10-CM | POA: Diagnosis not present

## 2020-03-24 DIAGNOSIS — M6250 Muscle wasting and atrophy, not elsewhere classified, unspecified site: Secondary | ICD-10-CM | POA: Diagnosis not present

## 2020-03-24 DIAGNOSIS — Z950 Presence of cardiac pacemaker: Secondary | ICD-10-CM | POA: Diagnosis not present

## 2020-03-24 DIAGNOSIS — I2510 Atherosclerotic heart disease of native coronary artery without angina pectoris: Secondary | ICD-10-CM | POA: Diagnosis not present

## 2020-03-24 DIAGNOSIS — F0390 Unspecified dementia without behavioral disturbance: Secondary | ICD-10-CM | POA: Diagnosis not present

## 2020-03-24 DIAGNOSIS — Z9181 History of falling: Secondary | ICD-10-CM | POA: Diagnosis not present

## 2020-03-24 DIAGNOSIS — Z96642 Presence of left artificial hip joint: Secondary | ICD-10-CM | POA: Diagnosis not present

## 2020-03-24 DIAGNOSIS — I495 Sick sinus syndrome: Secondary | ICD-10-CM | POA: Diagnosis not present

## 2020-03-24 DIAGNOSIS — L89622 Pressure ulcer of left heel, stage 2: Secondary | ICD-10-CM | POA: Diagnosis not present

## 2020-03-24 DIAGNOSIS — E785 Hyperlipidemia, unspecified: Secondary | ICD-10-CM | POA: Diagnosis not present

## 2020-03-24 DIAGNOSIS — Z4800 Encounter for change or removal of nonsurgical wound dressing: Secondary | ICD-10-CM | POA: Diagnosis not present

## 2020-03-24 DIAGNOSIS — I10 Essential (primary) hypertension: Secondary | ICD-10-CM | POA: Diagnosis not present

## 2020-03-24 DIAGNOSIS — E559 Vitamin D deficiency, unspecified: Secondary | ICD-10-CM | POA: Diagnosis not present

## 2020-03-24 DIAGNOSIS — Z7982 Long term (current) use of aspirin: Secondary | ICD-10-CM | POA: Diagnosis not present

## 2020-03-25 DIAGNOSIS — Z950 Presence of cardiac pacemaker: Secondary | ICD-10-CM | POA: Diagnosis not present

## 2020-03-25 DIAGNOSIS — F0390 Unspecified dementia without behavioral disturbance: Secondary | ICD-10-CM | POA: Diagnosis not present

## 2020-03-25 DIAGNOSIS — I10 Essential (primary) hypertension: Secondary | ICD-10-CM | POA: Diagnosis not present

## 2020-03-25 DIAGNOSIS — E785 Hyperlipidemia, unspecified: Secondary | ICD-10-CM | POA: Diagnosis not present

## 2020-03-25 DIAGNOSIS — Z96642 Presence of left artificial hip joint: Secondary | ICD-10-CM | POA: Diagnosis not present

## 2020-03-25 DIAGNOSIS — Z9181 History of falling: Secondary | ICD-10-CM | POA: Diagnosis not present

## 2020-03-25 DIAGNOSIS — Z7982 Long term (current) use of aspirin: Secondary | ICD-10-CM | POA: Diagnosis not present

## 2020-03-25 DIAGNOSIS — I495 Sick sinus syndrome: Secondary | ICD-10-CM | POA: Diagnosis not present

## 2020-03-25 DIAGNOSIS — M6250 Muscle wasting and atrophy, not elsewhere classified, unspecified site: Secondary | ICD-10-CM | POA: Diagnosis not present

## 2020-03-25 DIAGNOSIS — E559 Vitamin D deficiency, unspecified: Secondary | ICD-10-CM | POA: Diagnosis not present

## 2020-03-25 DIAGNOSIS — Z4800 Encounter for change or removal of nonsurgical wound dressing: Secondary | ICD-10-CM | POA: Diagnosis not present

## 2020-03-25 DIAGNOSIS — L89622 Pressure ulcer of left heel, stage 2: Secondary | ICD-10-CM | POA: Diagnosis not present

## 2020-03-25 DIAGNOSIS — I2510 Atherosclerotic heart disease of native coronary artery without angina pectoris: Secondary | ICD-10-CM | POA: Diagnosis not present

## 2020-03-29 DIAGNOSIS — I495 Sick sinus syndrome: Secondary | ICD-10-CM | POA: Diagnosis not present

## 2020-03-29 DIAGNOSIS — F0390 Unspecified dementia without behavioral disturbance: Secondary | ICD-10-CM | POA: Diagnosis not present

## 2020-03-29 DIAGNOSIS — Z96642 Presence of left artificial hip joint: Secondary | ICD-10-CM | POA: Diagnosis not present

## 2020-03-29 DIAGNOSIS — L89622 Pressure ulcer of left heel, stage 2: Secondary | ICD-10-CM | POA: Diagnosis not present

## 2020-03-29 DIAGNOSIS — Z4800 Encounter for change or removal of nonsurgical wound dressing: Secondary | ICD-10-CM | POA: Diagnosis not present

## 2020-03-29 DIAGNOSIS — I10 Essential (primary) hypertension: Secondary | ICD-10-CM | POA: Diagnosis not present

## 2020-03-29 DIAGNOSIS — Z9181 History of falling: Secondary | ICD-10-CM | POA: Diagnosis not present

## 2020-03-29 DIAGNOSIS — E785 Hyperlipidemia, unspecified: Secondary | ICD-10-CM | POA: Diagnosis not present

## 2020-03-29 DIAGNOSIS — Z950 Presence of cardiac pacemaker: Secondary | ICD-10-CM | POA: Diagnosis not present

## 2020-03-29 DIAGNOSIS — Z7982 Long term (current) use of aspirin: Secondary | ICD-10-CM | POA: Diagnosis not present

## 2020-03-29 DIAGNOSIS — I2510 Atherosclerotic heart disease of native coronary artery without angina pectoris: Secondary | ICD-10-CM | POA: Diagnosis not present

## 2020-03-29 DIAGNOSIS — M6250 Muscle wasting and atrophy, not elsewhere classified, unspecified site: Secondary | ICD-10-CM | POA: Diagnosis not present

## 2020-03-29 DIAGNOSIS — E559 Vitamin D deficiency, unspecified: Secondary | ICD-10-CM | POA: Diagnosis not present

## 2020-03-30 DIAGNOSIS — I2510 Atherosclerotic heart disease of native coronary artery without angina pectoris: Secondary | ICD-10-CM | POA: Diagnosis not present

## 2020-03-30 DIAGNOSIS — E785 Hyperlipidemia, unspecified: Secondary | ICD-10-CM | POA: Diagnosis not present

## 2020-03-30 DIAGNOSIS — Z4800 Encounter for change or removal of nonsurgical wound dressing: Secondary | ICD-10-CM | POA: Diagnosis not present

## 2020-03-30 DIAGNOSIS — E559 Vitamin D deficiency, unspecified: Secondary | ICD-10-CM | POA: Diagnosis not present

## 2020-03-30 DIAGNOSIS — M6250 Muscle wasting and atrophy, not elsewhere classified, unspecified site: Secondary | ICD-10-CM | POA: Diagnosis not present

## 2020-03-30 DIAGNOSIS — Z7982 Long term (current) use of aspirin: Secondary | ICD-10-CM | POA: Diagnosis not present

## 2020-03-30 DIAGNOSIS — Z950 Presence of cardiac pacemaker: Secondary | ICD-10-CM | POA: Diagnosis not present

## 2020-03-30 DIAGNOSIS — Z9181 History of falling: Secondary | ICD-10-CM | POA: Diagnosis not present

## 2020-03-30 DIAGNOSIS — I10 Essential (primary) hypertension: Secondary | ICD-10-CM | POA: Diagnosis not present

## 2020-03-30 DIAGNOSIS — F0390 Unspecified dementia without behavioral disturbance: Secondary | ICD-10-CM | POA: Diagnosis not present

## 2020-03-30 DIAGNOSIS — Z96642 Presence of left artificial hip joint: Secondary | ICD-10-CM | POA: Diagnosis not present

## 2020-03-30 DIAGNOSIS — I495 Sick sinus syndrome: Secondary | ICD-10-CM | POA: Diagnosis not present

## 2020-03-30 DIAGNOSIS — L89622 Pressure ulcer of left heel, stage 2: Secondary | ICD-10-CM | POA: Diagnosis not present

## 2020-03-31 DIAGNOSIS — K5909 Other constipation: Secondary | ICD-10-CM | POA: Diagnosis not present

## 2020-03-31 DIAGNOSIS — I10 Essential (primary) hypertension: Secondary | ICD-10-CM | POA: Diagnosis not present

## 2020-03-31 DIAGNOSIS — Z9181 History of falling: Secondary | ICD-10-CM | POA: Diagnosis not present

## 2020-04-01 DIAGNOSIS — M6250 Muscle wasting and atrophy, not elsewhere classified, unspecified site: Secondary | ICD-10-CM | POA: Diagnosis not present

## 2020-04-01 DIAGNOSIS — I495 Sick sinus syndrome: Secondary | ICD-10-CM | POA: Diagnosis not present

## 2020-04-01 DIAGNOSIS — F0390 Unspecified dementia without behavioral disturbance: Secondary | ICD-10-CM | POA: Diagnosis not present

## 2020-04-01 DIAGNOSIS — E559 Vitamin D deficiency, unspecified: Secondary | ICD-10-CM | POA: Diagnosis not present

## 2020-04-01 DIAGNOSIS — Z7982 Long term (current) use of aspirin: Secondary | ICD-10-CM | POA: Diagnosis not present

## 2020-04-01 DIAGNOSIS — Z96642 Presence of left artificial hip joint: Secondary | ICD-10-CM | POA: Diagnosis not present

## 2020-04-01 DIAGNOSIS — I10 Essential (primary) hypertension: Secondary | ICD-10-CM | POA: Diagnosis not present

## 2020-04-01 DIAGNOSIS — E785 Hyperlipidemia, unspecified: Secondary | ICD-10-CM | POA: Diagnosis not present

## 2020-04-01 DIAGNOSIS — Z4800 Encounter for change or removal of nonsurgical wound dressing: Secondary | ICD-10-CM | POA: Diagnosis not present

## 2020-04-01 DIAGNOSIS — I2510 Atherosclerotic heart disease of native coronary artery without angina pectoris: Secondary | ICD-10-CM | POA: Diagnosis not present

## 2020-04-01 DIAGNOSIS — L89622 Pressure ulcer of left heel, stage 2: Secondary | ICD-10-CM | POA: Diagnosis not present

## 2020-04-01 DIAGNOSIS — Z950 Presence of cardiac pacemaker: Secondary | ICD-10-CM | POA: Diagnosis not present

## 2020-04-01 DIAGNOSIS — Z9181 History of falling: Secondary | ICD-10-CM | POA: Diagnosis not present

## 2020-04-02 DIAGNOSIS — Z950 Presence of cardiac pacemaker: Secondary | ICD-10-CM | POA: Diagnosis not present

## 2020-04-02 DIAGNOSIS — E785 Hyperlipidemia, unspecified: Secondary | ICD-10-CM | POA: Diagnosis not present

## 2020-04-02 DIAGNOSIS — Z7982 Long term (current) use of aspirin: Secondary | ICD-10-CM | POA: Diagnosis not present

## 2020-04-02 DIAGNOSIS — I495 Sick sinus syndrome: Secondary | ICD-10-CM | POA: Diagnosis not present

## 2020-04-02 DIAGNOSIS — L89622 Pressure ulcer of left heel, stage 2: Secondary | ICD-10-CM | POA: Diagnosis not present

## 2020-04-02 DIAGNOSIS — I10 Essential (primary) hypertension: Secondary | ICD-10-CM | POA: Diagnosis not present

## 2020-04-02 DIAGNOSIS — Z4800 Encounter for change or removal of nonsurgical wound dressing: Secondary | ICD-10-CM | POA: Diagnosis not present

## 2020-04-02 DIAGNOSIS — F0390 Unspecified dementia without behavioral disturbance: Secondary | ICD-10-CM | POA: Diagnosis not present

## 2020-04-02 DIAGNOSIS — M6250 Muscle wasting and atrophy, not elsewhere classified, unspecified site: Secondary | ICD-10-CM | POA: Diagnosis not present

## 2020-04-02 DIAGNOSIS — I2510 Atherosclerotic heart disease of native coronary artery without angina pectoris: Secondary | ICD-10-CM | POA: Diagnosis not present

## 2020-04-02 DIAGNOSIS — E559 Vitamin D deficiency, unspecified: Secondary | ICD-10-CM | POA: Diagnosis not present

## 2020-04-02 DIAGNOSIS — Z96642 Presence of left artificial hip joint: Secondary | ICD-10-CM | POA: Diagnosis not present

## 2020-04-02 DIAGNOSIS — Z9181 History of falling: Secondary | ICD-10-CM | POA: Diagnosis not present

## 2020-04-06 DIAGNOSIS — E785 Hyperlipidemia, unspecified: Secondary | ICD-10-CM | POA: Diagnosis not present

## 2020-04-06 DIAGNOSIS — Z4800 Encounter for change or removal of nonsurgical wound dressing: Secondary | ICD-10-CM | POA: Diagnosis not present

## 2020-04-06 DIAGNOSIS — Z96642 Presence of left artificial hip joint: Secondary | ICD-10-CM | POA: Diagnosis not present

## 2020-04-06 DIAGNOSIS — Z7982 Long term (current) use of aspirin: Secondary | ICD-10-CM | POA: Diagnosis not present

## 2020-04-06 DIAGNOSIS — I10 Essential (primary) hypertension: Secondary | ICD-10-CM | POA: Diagnosis not present

## 2020-04-06 DIAGNOSIS — F0390 Unspecified dementia without behavioral disturbance: Secondary | ICD-10-CM | POA: Diagnosis not present

## 2020-04-06 DIAGNOSIS — Z950 Presence of cardiac pacemaker: Secondary | ICD-10-CM | POA: Diagnosis not present

## 2020-04-06 DIAGNOSIS — L89622 Pressure ulcer of left heel, stage 2: Secondary | ICD-10-CM | POA: Diagnosis not present

## 2020-04-06 DIAGNOSIS — I495 Sick sinus syndrome: Secondary | ICD-10-CM | POA: Diagnosis not present

## 2020-04-06 DIAGNOSIS — I2510 Atherosclerotic heart disease of native coronary artery without angina pectoris: Secondary | ICD-10-CM | POA: Diagnosis not present

## 2020-04-06 DIAGNOSIS — E559 Vitamin D deficiency, unspecified: Secondary | ICD-10-CM | POA: Diagnosis not present

## 2020-04-06 DIAGNOSIS — Z9181 History of falling: Secondary | ICD-10-CM | POA: Diagnosis not present

## 2020-04-06 DIAGNOSIS — M6250 Muscle wasting and atrophy, not elsewhere classified, unspecified site: Secondary | ICD-10-CM | POA: Diagnosis not present

## 2020-04-07 DIAGNOSIS — F0390 Unspecified dementia without behavioral disturbance: Secondary | ICD-10-CM | POA: Diagnosis not present

## 2020-04-07 DIAGNOSIS — I495 Sick sinus syndrome: Secondary | ICD-10-CM | POA: Diagnosis not present

## 2020-04-07 DIAGNOSIS — Z96642 Presence of left artificial hip joint: Secondary | ICD-10-CM | POA: Diagnosis not present

## 2020-04-07 DIAGNOSIS — Z950 Presence of cardiac pacemaker: Secondary | ICD-10-CM | POA: Diagnosis not present

## 2020-04-07 DIAGNOSIS — E559 Vitamin D deficiency, unspecified: Secondary | ICD-10-CM | POA: Diagnosis not present

## 2020-04-07 DIAGNOSIS — Z4800 Encounter for change or removal of nonsurgical wound dressing: Secondary | ICD-10-CM | POA: Diagnosis not present

## 2020-04-07 DIAGNOSIS — Z9181 History of falling: Secondary | ICD-10-CM | POA: Diagnosis not present

## 2020-04-07 DIAGNOSIS — I10 Essential (primary) hypertension: Secondary | ICD-10-CM | POA: Diagnosis not present

## 2020-04-07 DIAGNOSIS — M6250 Muscle wasting and atrophy, not elsewhere classified, unspecified site: Secondary | ICD-10-CM | POA: Diagnosis not present

## 2020-04-07 DIAGNOSIS — I2510 Atherosclerotic heart disease of native coronary artery without angina pectoris: Secondary | ICD-10-CM | POA: Diagnosis not present

## 2020-04-07 DIAGNOSIS — E785 Hyperlipidemia, unspecified: Secondary | ICD-10-CM | POA: Diagnosis not present

## 2020-04-07 DIAGNOSIS — Z7982 Long term (current) use of aspirin: Secondary | ICD-10-CM | POA: Diagnosis not present

## 2020-04-07 DIAGNOSIS — L89622 Pressure ulcer of left heel, stage 2: Secondary | ICD-10-CM | POA: Diagnosis not present

## 2020-04-08 DIAGNOSIS — Z96642 Presence of left artificial hip joint: Secondary | ICD-10-CM | POA: Diagnosis not present

## 2020-04-08 DIAGNOSIS — I495 Sick sinus syndrome: Secondary | ICD-10-CM | POA: Diagnosis not present

## 2020-04-08 DIAGNOSIS — E559 Vitamin D deficiency, unspecified: Secondary | ICD-10-CM | POA: Diagnosis not present

## 2020-04-08 DIAGNOSIS — I10 Essential (primary) hypertension: Secondary | ICD-10-CM | POA: Diagnosis not present

## 2020-04-08 DIAGNOSIS — Z950 Presence of cardiac pacemaker: Secondary | ICD-10-CM | POA: Diagnosis not present

## 2020-04-08 DIAGNOSIS — Z9181 History of falling: Secondary | ICD-10-CM | POA: Diagnosis not present

## 2020-04-08 DIAGNOSIS — M6250 Muscle wasting and atrophy, not elsewhere classified, unspecified site: Secondary | ICD-10-CM | POA: Diagnosis not present

## 2020-04-08 DIAGNOSIS — L89622 Pressure ulcer of left heel, stage 2: Secondary | ICD-10-CM | POA: Diagnosis not present

## 2020-04-08 DIAGNOSIS — Z4800 Encounter for change or removal of nonsurgical wound dressing: Secondary | ICD-10-CM | POA: Diagnosis not present

## 2020-04-08 DIAGNOSIS — Z7982 Long term (current) use of aspirin: Secondary | ICD-10-CM | POA: Diagnosis not present

## 2020-04-08 DIAGNOSIS — I2510 Atherosclerotic heart disease of native coronary artery without angina pectoris: Secondary | ICD-10-CM | POA: Diagnosis not present

## 2020-04-08 DIAGNOSIS — F0390 Unspecified dementia without behavioral disturbance: Secondary | ICD-10-CM | POA: Diagnosis not present

## 2020-04-08 DIAGNOSIS — E785 Hyperlipidemia, unspecified: Secondary | ICD-10-CM | POA: Diagnosis not present

## 2020-04-09 DIAGNOSIS — L89622 Pressure ulcer of left heel, stage 2: Secondary | ICD-10-CM | POA: Diagnosis not present

## 2020-04-09 DIAGNOSIS — E559 Vitamin D deficiency, unspecified: Secondary | ICD-10-CM | POA: Diagnosis not present

## 2020-04-09 DIAGNOSIS — I2510 Atherosclerotic heart disease of native coronary artery without angina pectoris: Secondary | ICD-10-CM | POA: Diagnosis not present

## 2020-04-09 DIAGNOSIS — M6250 Muscle wasting and atrophy, not elsewhere classified, unspecified site: Secondary | ICD-10-CM | POA: Diagnosis not present

## 2020-04-09 DIAGNOSIS — Z950 Presence of cardiac pacemaker: Secondary | ICD-10-CM | POA: Diagnosis not present

## 2020-04-09 DIAGNOSIS — Z7982 Long term (current) use of aspirin: Secondary | ICD-10-CM | POA: Diagnosis not present

## 2020-04-09 DIAGNOSIS — F0390 Unspecified dementia without behavioral disturbance: Secondary | ICD-10-CM | POA: Diagnosis not present

## 2020-04-09 DIAGNOSIS — I495 Sick sinus syndrome: Secondary | ICD-10-CM | POA: Diagnosis not present

## 2020-04-09 DIAGNOSIS — Z9181 History of falling: Secondary | ICD-10-CM | POA: Diagnosis not present

## 2020-04-09 DIAGNOSIS — I10 Essential (primary) hypertension: Secondary | ICD-10-CM | POA: Diagnosis not present

## 2020-04-09 DIAGNOSIS — Z4800 Encounter for change or removal of nonsurgical wound dressing: Secondary | ICD-10-CM | POA: Diagnosis not present

## 2020-04-09 DIAGNOSIS — E785 Hyperlipidemia, unspecified: Secondary | ICD-10-CM | POA: Diagnosis not present

## 2020-04-09 DIAGNOSIS — Z96642 Presence of left artificial hip joint: Secondary | ICD-10-CM | POA: Diagnosis not present

## 2020-04-12 ENCOUNTER — Ambulatory Visit (INDEPENDENT_AMBULATORY_CARE_PROVIDER_SITE_OTHER): Payer: Medicare Other | Admitting: *Deleted

## 2020-04-12 DIAGNOSIS — I2510 Atherosclerotic heart disease of native coronary artery without angina pectoris: Secondary | ICD-10-CM | POA: Diagnosis not present

## 2020-04-12 DIAGNOSIS — I495 Sick sinus syndrome: Secondary | ICD-10-CM | POA: Diagnosis not present

## 2020-04-12 DIAGNOSIS — Z950 Presence of cardiac pacemaker: Secondary | ICD-10-CM | POA: Diagnosis not present

## 2020-04-12 DIAGNOSIS — M6250 Muscle wasting and atrophy, not elsewhere classified, unspecified site: Secondary | ICD-10-CM | POA: Diagnosis not present

## 2020-04-12 DIAGNOSIS — Z4800 Encounter for change or removal of nonsurgical wound dressing: Secondary | ICD-10-CM | POA: Diagnosis not present

## 2020-04-12 DIAGNOSIS — L89622 Pressure ulcer of left heel, stage 2: Secondary | ICD-10-CM | POA: Diagnosis not present

## 2020-04-12 DIAGNOSIS — Z7982 Long term (current) use of aspirin: Secondary | ICD-10-CM | POA: Diagnosis not present

## 2020-04-12 DIAGNOSIS — Z96642 Presence of left artificial hip joint: Secondary | ICD-10-CM | POA: Diagnosis not present

## 2020-04-12 DIAGNOSIS — Z9181 History of falling: Secondary | ICD-10-CM | POA: Diagnosis not present

## 2020-04-12 DIAGNOSIS — F0390 Unspecified dementia without behavioral disturbance: Secondary | ICD-10-CM | POA: Diagnosis not present

## 2020-04-12 DIAGNOSIS — E559 Vitamin D deficiency, unspecified: Secondary | ICD-10-CM | POA: Diagnosis not present

## 2020-04-12 DIAGNOSIS — I10 Essential (primary) hypertension: Secondary | ICD-10-CM | POA: Diagnosis not present

## 2020-04-12 DIAGNOSIS — E785 Hyperlipidemia, unspecified: Secondary | ICD-10-CM | POA: Diagnosis not present

## 2020-04-13 LAB — CUP PACEART REMOTE DEVICE CHECK
Battery Remaining Longevity: 121 mo
Battery Remaining Percentage: 91 %
Battery Voltage: 2.95 V
Brady Statistic AP VP Percent: 1 %
Brady Statistic AP VS Percent: 9.4 %
Brady Statistic AS VP Percent: 1 %
Brady Statistic AS VS Percent: 90 %
Brady Statistic RA Percent Paced: 8.7 %
Brady Statistic RV Percent Paced: 1 %
Date Time Interrogation Session: 20210913033153
Implantable Lead Implant Date: 20140414
Implantable Lead Implant Date: 20140414
Implantable Lead Location: 753859
Implantable Lead Location: 753860
Implantable Pulse Generator Implant Date: 20140414
Lead Channel Impedance Value: 390 Ohm
Lead Channel Impedance Value: 400 Ohm
Lead Channel Pacing Threshold Amplitude: 0.5 V
Lead Channel Pacing Threshold Amplitude: 0.75 V
Lead Channel Pacing Threshold Pulse Width: 0.4 ms
Lead Channel Pacing Threshold Pulse Width: 0.4 ms
Lead Channel Sensing Intrinsic Amplitude: 2.2 mV
Lead Channel Sensing Intrinsic Amplitude: 3.8 mV
Lead Channel Setting Pacing Amplitude: 1 V
Lead Channel Setting Pacing Amplitude: 1.5 V
Lead Channel Setting Pacing Pulse Width: 0.4 ms
Lead Channel Setting Sensing Sensitivity: 2 mV
Pulse Gen Model: 2210
Pulse Gen Serial Number: 7437302

## 2020-04-14 NOTE — Progress Notes (Signed)
Remote pacemaker transmission.   

## 2020-04-26 ENCOUNTER — Other Ambulatory Visit (HOSPITAL_COMMUNITY)
Admission: RE | Admit: 2020-04-26 | Discharge: 2020-04-26 | Disposition: A | Discharge status: Home / Self Care | Payer: Medicare Other | Source: Skilled Nursing Facility | Attending: Family Medicine | Admitting: Family Medicine

## 2020-04-26 DIAGNOSIS — N390 Urinary tract infection, site not specified: Secondary | ICD-10-CM | POA: Diagnosis not present

## 2020-04-26 LAB — URINALYSIS, COMPLETE (UACMP) WITH MICROSCOPIC
Bacteria, UA: NONE SEEN
Bilirubin Urine: NEGATIVE
Glucose, UA: NEGATIVE mg/dL
Hgb urine dipstick: NEGATIVE
Ketones, ur: NEGATIVE mg/dL
Leukocytes,Ua: NEGATIVE
Nitrite: NEGATIVE
Protein, ur: NEGATIVE mg/dL
Specific Gravity, Urine: 1.014 (ref 1.005–1.030)
pH: 6 (ref 5.0–8.0)

## 2020-04-27 DIAGNOSIS — M79675 Pain in left toe(s): Secondary | ICD-10-CM | POA: Diagnosis not present

## 2020-04-27 DIAGNOSIS — B351 Tinea unguium: Secondary | ICD-10-CM | POA: Diagnosis not present

## 2020-04-27 DIAGNOSIS — M79674 Pain in right toe(s): Secondary | ICD-10-CM | POA: Diagnosis not present

## 2020-04-27 LAB — URINE CULTURE

## 2020-04-29 DIAGNOSIS — F0150 Vascular dementia without behavioral disturbance: Secondary | ICD-10-CM | POA: Diagnosis not present

## 2020-04-29 DIAGNOSIS — F0631 Mood disorder due to known physiological condition with depressive features: Secondary | ICD-10-CM | POA: Diagnosis not present

## 2020-04-29 DIAGNOSIS — G301 Alzheimer's disease with late onset: Secondary | ICD-10-CM | POA: Diagnosis not present

## 2020-04-29 DIAGNOSIS — G894 Chronic pain syndrome: Secondary | ICD-10-CM | POA: Diagnosis not present

## 2020-04-30 ENCOUNTER — Telehealth: Payer: Self-pay | Admitting: Emergency Medicine

## 2020-04-30 NOTE — Telephone Encounter (Signed)
Per Dr Ladona Ridgel due to brief episodes and advanced age  No OAC at this time for brief AF of 1 hr 50 minutes.

## 2020-05-05 DIAGNOSIS — I10 Essential (primary) hypertension: Secondary | ICD-10-CM | POA: Diagnosis not present

## 2020-05-05 DIAGNOSIS — M62569 Muscle wasting and atrophy, not elsewhere classified, unspecified lower leg: Secondary | ICD-10-CM | POA: Diagnosis not present

## 2020-05-05 DIAGNOSIS — K5909 Other constipation: Secondary | ICD-10-CM | POA: Diagnosis not present

## 2020-05-05 DIAGNOSIS — M62559 Muscle wasting and atrophy, not elsewhere classified, unspecified thigh: Secondary | ICD-10-CM | POA: Diagnosis not present

## 2020-05-10 DIAGNOSIS — F339 Major depressive disorder, recurrent, unspecified: Secondary | ICD-10-CM | POA: Diagnosis not present

## 2020-05-11 DIAGNOSIS — M62559 Muscle wasting and atrophy, not elsewhere classified, unspecified thigh: Secondary | ICD-10-CM | POA: Diagnosis not present

## 2020-05-11 DIAGNOSIS — M62569 Muscle wasting and atrophy, not elsewhere classified, unspecified lower leg: Secondary | ICD-10-CM | POA: Diagnosis not present

## 2020-05-11 DIAGNOSIS — I10 Essential (primary) hypertension: Secondary | ICD-10-CM | POA: Diagnosis not present

## 2020-05-11 DIAGNOSIS — R634 Abnormal weight loss: Secondary | ICD-10-CM | POA: Diagnosis not present

## 2020-06-02 DIAGNOSIS — M62569 Muscle wasting and atrophy, not elsewhere classified, unspecified lower leg: Secondary | ICD-10-CM | POA: Diagnosis not present

## 2020-06-02 DIAGNOSIS — M62559 Muscle wasting and atrophy, not elsewhere classified, unspecified thigh: Secondary | ICD-10-CM | POA: Diagnosis not present

## 2020-06-02 DIAGNOSIS — I10 Essential (primary) hypertension: Secondary | ICD-10-CM | POA: Diagnosis not present

## 2020-06-02 DIAGNOSIS — R634 Abnormal weight loss: Secondary | ICD-10-CM | POA: Diagnosis not present

## 2020-06-04 ENCOUNTER — Other Ambulatory Visit: Payer: Self-pay

## 2020-06-04 ENCOUNTER — Emergency Department (HOSPITAL_COMMUNITY): Payer: Medicare Other

## 2020-06-04 ENCOUNTER — Encounter (HOSPITAL_COMMUNITY): Payer: Self-pay | Admitting: Emergency Medicine

## 2020-06-04 ENCOUNTER — Inpatient Hospital Stay (HOSPITAL_COMMUNITY)
Admission: EM | Admit: 2020-06-04 | Discharge: 2020-06-06 | DRG: 064 | Disposition: A | Discharge status: Skilled Nursing Facility | Payer: Medicare Other | Source: Skilled Nursing Facility | Attending: Internal Medicine | Admitting: Internal Medicine

## 2020-06-04 DIAGNOSIS — I639 Cerebral infarction, unspecified: Secondary | ICD-10-CM | POA: Diagnosis present

## 2020-06-04 DIAGNOSIS — I161 Hypertensive emergency: Secondary | ICD-10-CM | POA: Diagnosis present

## 2020-06-04 DIAGNOSIS — R4781 Slurred speech: Secondary | ICD-10-CM | POA: Diagnosis not present

## 2020-06-04 DIAGNOSIS — Z955 Presence of coronary angioplasty implant and graft: Secondary | ICD-10-CM

## 2020-06-04 DIAGNOSIS — I10 Essential (primary) hypertension: Secondary | ICD-10-CM | POA: Diagnosis not present

## 2020-06-04 DIAGNOSIS — D61818 Other pancytopenia: Secondary | ICD-10-CM

## 2020-06-04 DIAGNOSIS — H3530 Unspecified macular degeneration: Secondary | ICD-10-CM | POA: Diagnosis present

## 2020-06-04 DIAGNOSIS — R739 Hyperglycemia, unspecified: Secondary | ICD-10-CM | POA: Diagnosis not present

## 2020-06-04 DIAGNOSIS — R29810 Facial weakness: Secondary | ICD-10-CM | POA: Diagnosis not present

## 2020-06-04 DIAGNOSIS — R29818 Other symptoms and signs involving the nervous system: Secondary | ICD-10-CM | POA: Diagnosis not present

## 2020-06-04 DIAGNOSIS — F0390 Unspecified dementia without behavioral disturbance: Secondary | ICD-10-CM | POA: Diagnosis present

## 2020-06-04 DIAGNOSIS — Z86010 Personal history of colonic polyps: Secondary | ICD-10-CM

## 2020-06-04 DIAGNOSIS — M810 Age-related osteoporosis without current pathological fracture: Secondary | ICD-10-CM | POA: Diagnosis present

## 2020-06-04 DIAGNOSIS — Z7982 Long term (current) use of aspirin: Secondary | ICD-10-CM

## 2020-06-04 DIAGNOSIS — I2510 Atherosclerotic heart disease of native coronary artery without angina pectoris: Secondary | ICD-10-CM | POA: Diagnosis present

## 2020-06-04 DIAGNOSIS — E860 Dehydration: Secondary | ICD-10-CM | POA: Diagnosis present

## 2020-06-04 DIAGNOSIS — R269 Unspecified abnormalities of gait and mobility: Secondary | ICD-10-CM | POA: Diagnosis present

## 2020-06-04 DIAGNOSIS — R404 Transient alteration of awareness: Secondary | ICD-10-CM | POA: Diagnosis not present

## 2020-06-04 DIAGNOSIS — Z79899 Other long term (current) drug therapy: Secondary | ICD-10-CM

## 2020-06-04 DIAGNOSIS — R000 Tachycardia, unspecified: Secondary | ICD-10-CM | POA: Diagnosis not present

## 2020-06-04 DIAGNOSIS — U071 COVID-19: Secondary | ICD-10-CM | POA: Diagnosis not present

## 2020-06-04 DIAGNOSIS — I495 Sick sinus syndrome: Secondary | ICD-10-CM | POA: Diagnosis present

## 2020-06-04 DIAGNOSIS — E782 Mixed hyperlipidemia: Secondary | ICD-10-CM

## 2020-06-04 DIAGNOSIS — Z8249 Family history of ischemic heart disease and other diseases of the circulatory system: Secondary | ICD-10-CM

## 2020-06-04 DIAGNOSIS — I69351 Hemiplegia and hemiparesis following cerebral infarction affecting right dominant side: Secondary | ICD-10-CM

## 2020-06-04 DIAGNOSIS — Z888 Allergy status to other drugs, medicaments and biological substances status: Secondary | ICD-10-CM

## 2020-06-04 DIAGNOSIS — E785 Hyperlipidemia, unspecified: Secondary | ICD-10-CM | POA: Diagnosis present

## 2020-06-04 DIAGNOSIS — Z96642 Presence of left artificial hip joint: Secondary | ICD-10-CM | POA: Diagnosis present

## 2020-06-04 DIAGNOSIS — I629 Nontraumatic intracranial hemorrhage, unspecified: Principal | ICD-10-CM | POA: Diagnosis present

## 2020-06-04 DIAGNOSIS — Z86718 Personal history of other venous thrombosis and embolism: Secondary | ICD-10-CM

## 2020-06-04 DIAGNOSIS — Z95810 Presence of automatic (implantable) cardiac defibrillator: Secondary | ICD-10-CM

## 2020-06-04 DIAGNOSIS — Z90710 Acquired absence of both cervix and uterus: Secondary | ICD-10-CM

## 2020-06-04 LAB — COMPREHENSIVE METABOLIC PANEL
ALT: 11 U/L (ref 0–44)
AST: 17 U/L (ref 15–41)
Albumin: 4.2 g/dL (ref 3.5–5.0)
Alkaline Phosphatase: 77 U/L (ref 38–126)
Anion gap: 6 (ref 5–15)
BUN: 25 mg/dL — ABNORMAL HIGH (ref 8–23)
CO2: 29 mmol/L (ref 22–32)
Calcium: 9.7 mg/dL (ref 8.9–10.3)
Chloride: 107 mmol/L (ref 98–111)
Creatinine, Ser: 0.89 mg/dL (ref 0.44–1.00)
GFR, Estimated: 59 mL/min — ABNORMAL LOW (ref >60)
Glucose, Bld: 143 mg/dL — ABNORMAL HIGH (ref 70–99)
Potassium: 3.6 mmol/L (ref 3.5–5.1)
Sodium: 142 mmol/L (ref 135–145)
Total Bilirubin: 0.5 mg/dL (ref 0.3–1.2)
Total Protein: 6.5 g/dL (ref 6.5–8.1)

## 2020-06-04 LAB — PROTIME-INR
INR: 1.1 (ref 0.8–1.2)
Prothrombin Time: 13.8 seconds (ref 11.4–15.2)

## 2020-06-04 LAB — DIFFERENTIAL
Abs Immature Granulocytes: 0 10*3/uL (ref 0.00–0.07)
Basophils Absolute: 0 10*3/uL (ref 0.0–0.1)
Basophils Relative: 0 %
Eosinophils Absolute: 0.1 10*3/uL (ref 0.0–0.5)
Eosinophils Relative: 2 %
Immature Granulocytes: 0 %
Lymphocytes Relative: 38 %
Lymphs Abs: 1.5 10*3/uL (ref 0.7–4.0)
Monocytes Absolute: 0.5 10*3/uL (ref 0.1–1.0)
Monocytes Relative: 13 %
Neutro Abs: 1.9 10*3/uL (ref 1.7–7.7)
Neutrophils Relative %: 47 %

## 2020-06-04 LAB — CBC
HCT: 38.9 % (ref 36.0–46.0)
Hemoglobin: 11.8 g/dL — ABNORMAL LOW (ref 12.0–15.0)
MCH: 29.8 pg (ref 26.0–34.0)
MCHC: 30.3 g/dL (ref 30.0–36.0)
MCV: 98.2 fL (ref 80.0–100.0)
Platelets: 133 10*3/uL — ABNORMAL LOW (ref 150–400)
RBC: 3.96 MIL/uL (ref 3.87–5.11)
RDW: 14.7 % (ref 11.5–15.5)
WBC: 3.9 10*3/uL — ABNORMAL LOW (ref 4.0–10.5)
nRBC: 0 % (ref 0.0–0.2)

## 2020-06-04 LAB — ETHANOL: Alcohol, Ethyl (B): <10 mg/dL (ref <10)

## 2020-06-04 LAB — APTT: aPTT: 30 seconds (ref 24–36)

## 2020-06-04 MED ORDER — HYDRALAZINE HCL 20 MG/ML IJ SOLN
5.0000 mg | Freq: Four times a day (QID) | INTRAMUSCULAR | Status: DC | PRN
  Administered 2020-06-06: 5 mg via INTRAVENOUS
  Filled 2020-06-04 (×2): qty 1

## 2020-06-04 MED ORDER — SODIUM CHLORIDE 0.9 % IV SOLN: Freq: Once | INTRAVENOUS | Status: AC

## 2020-06-04 MED ORDER — HYDRALAZINE HCL 20 MG/ML IJ SOLN
5.0000 mg | Freq: Once | INTRAMUSCULAR | Status: AC
  Administered 2020-06-04: 5 mg via INTRAVENOUS

## 2020-06-04 MED ORDER — HYDRALAZINE HCL 20 MG/ML IJ SOLN
5.0000 mg | Freq: Once | INTRAMUSCULAR | Status: AC
  Administered 2020-06-05: 5 mg via INTRAVENOUS
  Filled 2020-06-04: qty 1

## 2020-06-04 MED ORDER — HYDRALAZINE HCL 20 MG/ML IJ SOLN
5.0000 mg | Freq: Once | INTRAMUSCULAR | Status: AC
  Administered 2020-06-04: 5 mg via INTRAVENOUS
  Filled 2020-06-04: qty 1

## 2020-06-04 MED ORDER — CLEVIDIPINE BUTYRATE 0.5 MG/ML IV EMUL
0.0000 mg/h | INTRAVENOUS | Status: DC
  Administered 2020-06-04: 1 mg/h via INTRAVENOUS
  Administered 2020-06-05: 3 mg/h via INTRAVENOUS
  Filled 2020-06-04: qty 50

## 2020-06-04 NOTE — ED Triage Notes (Signed)
Pt from [facility] home via EMS with possible stroke. Per staff pt LKW was 1100am and was having R sided weakness and slurred speech. Dr Estell Harpin at bedside to assess.

## 2020-06-04 NOTE — H&P (Signed)
History and Physical  LASHELLE KOY ZOX:[id number] DOB: January 10, 1921 DOA: 06/04/2020  Referring physician: Bethann Berkshire, MD PCP: Pcp, No  Patient coming from: [name]  Chief Complaint: Right-sided weakness and slurred speech  HPI: NEZZIE MANERA is a 86 y.o. female with medical history significant for tachybradycardia syndrome status post pacemaker, previous left hip replacement, hypertension, hyperlipidemia and coronary artery disease who presented to ED via EMS. Patient states that she does not remember why she came to the ED, History was provided by ED physician, ED medical record and daughter in law at bedside.  Per report, patient was noted to present with right-sided weakness with leaning to the right side and with transitory right facial drooping and slurred speech at the ALF. Last known well was 11 AM.  EMS was activated and patient was taken to the ED for further evaluation and management.  Patient denies blurry vision, headache, chest pain, shortness of breath, numbness or tingling.  ED Course:  In the emergency department, blood pressure was 196/101 on arrival, otherwise patient was hemodynamically stable.  Work-up in the ED showed leukopenia, thrombocytopenia, hyperglycemia, BUN 25, creatinine 0.89.  Alcohol level was <10.  CT of head without contrast showed a small focus of acute hemorrhage of the left thalamus, moderate atrophy and moderate chronic microvascular ischemic change noted.  Neurologist on-call at [facility] was consulted and recommended giving clevidipine and to maintain patient's SBP <140 with recommendation to repeat CT of head in the morning and that there was no indication for patient to be transferred to [facility] at this time per ED physician.  IV hydralazine 5 mg x 2 was also given.  Hospitalist was asked to admit patient for further evaluation and management.  Review of Systems: Constitutional: Negative for chills and fever.  HENT: Negative for ear pain and sore throat.    Eyes: Negative for pain and visual disturbance.  Respiratory: Negative for cough, chest tightness and shortness of breath.   Cardiovascular: Negative for chest pain and palpitations.  Gastrointestinal: Negative for abdominal pain and vomiting.  Endocrine: Negative for polyphagia and polyuria.  Genitourinary: Negative for decreased urine volume, dysuria Musculoskeletal: Negative for arthralgias and back pain.  Skin: Negative for color change and rash.  Allergic/Immunologic: Negative for immunocompromised state.  Neurological: Negative for tremors, syncope, speech difficulty, weakness, light-headedness and headaches.  Hematological: Does not bruise/bleed easily.  All other systems reviewed and are negative    Past Medical History:  Diagnosis Date  . Coronary artery disease    stent in the LAD artery in 2002  . Gait disorder 03/05/2017  . HTN (hypertension) 08/30/2012  . Hyperlipidemia 08/30/2012  . ICD (implantable cardiac defibrillator) in place   . Macular degeneration   . Pacemaker 08/29/2012   st jude accent DR RF device, model number [id number], serial number [id number], implanted 08/29/2012 for sinus bradycardia, runs of supraventricular tachycardia  last checked 10/23/2012  . Polyneuropathy in other diseases classified elsewhere (HCC)   . Shingles 08/2016  . SSS (sick sinus syndrome) with PAT and marked bradycardia with 3 sec pauses  08/30/2012  . Status post placement of cardiac pacemaker. 08/29/12 St. Jude device 08/30/2012  . Venous insufficiency    Past Surgical History:  Procedure Laterality Date  . ABDOMINAL HYSTERECTOMY    . basal cell  06/2017   R side of face.  Marland Kitchen CARDIAC CATHETERIZATION  2006   3 stents placed  . CARDIAC CATHETERIZATION  06/2001   placement of BiodivYsio 2.5.10mm stent dilated to 2.51mm  in proximal left anterior descenting stenotic lesion  . CESAREAN SECTION    . COLONOSCOPY W/ POLYPECTOMY    . HIP ARTHROPLASTY Left 01/04/2014   Procedure: ARTHROPLASTY  BIPOLAR HIP;  Surgeon: Shelda Pal, MD;  Location: [facility] OR;  Service: Orthopedics;  Laterality: Left;  . INSERT / REPLACE / REMOVE PACEMAKER  08/29/2012   st jude   . KIDNEY STONE SURGERY    . OPEN REDUCTION INTERNAL FIXATION (ORIF) DISTAL RADIAL FRACTURE Right 04/01/2016   Procedure: OPEN REDUCTION INTERNAL FIXATION (ORIF) RIGHT DISTAL RADIAL FRACTURE AND REPAIR;  Surgeon: Bradly Bienenstock, MD;  Location: [facility] OR;  Service: Orthopedics;  Laterality: Right;  . PERMANENT PACEMAKER INSERTION N/A 08/29/2012   Procedure: PERMANENT PACEMAKER INSERTION;  Surgeon: Thurmon Fair, MD;  Location: [facility] CATH LAB;  Service: Cardiovascular;  Laterality: N/A;  . RADIOFREQUENCY ABLATION  08/25/2010   ablation of left greater saphenous vein  . ROTATOR CUFF REPAIR Left   . ROTATOR CUFF REPAIR Right   . TOTAL HIP REVISION Left 07/24/2015   Procedure: ORIF LEFT PERIPROSTHETIC FEMUR FRACTURE; CONVERSION TO TOTAL HIP ARTHROPLASTY POSS. FEMORAL COMPONENT REVISION ;  Surgeon: Samson Frederic, MD;  Location: [facility] OR;  Service: Orthopedics;  Laterality: Left;    Social History:  reports that she has never smoked. She has never used smokeless tobacco. She reports that she does not drink alcohol and does not use drugs.   Allergies  Allergen Reactions  . Gabapentin Other (See Comments)    Blurred vision, dizziness     Family History  Problem Relation Age of Onset  . Heart disease Mother   . Congestive Heart Failure Mother   . Heart disease Father   . Heart attack Father   . Heart disease Brother      Prior to Admission medications   Medication Sig Start Date End Date Taking? Authorizing Provider  acetaminophen (TYLENOL) 500 MG tablet Take 500 mg by mouth every 6 (six) hours as needed for mild pain or moderate pain.    Yes [provider]  aspirin EC 81 MG tablet Take 81 mg by mouth every morning.    Yes [provider]  carvedilol (COREG) 3.125 MG tablet Take 3.125 mg by mouth 2 (two) times daily.  06/03/20  Yes [provider]  docusate sodium (COLACE) 100 MG capsule Take 1 capsule (100 mg total) by mouth 2 (two) times daily. 04/01/16  Yes Bradly Bienenstock, MD  donepezil (ARICEPT) 10 MG tablet TAKE ONE TABLET BY MOUTH EVERY NIGHT AT BEDTIME 10/16/18  Yes York Spaniel, MD  FEROSUL 325 (65 Fe) MG tablet Take 325 mg by mouth daily. 03/01/20  Yes [provider]  lactose free nutrition (BOOST) LIQD Take 1 Container by mouth at bedtime. 05/18/20  Yes [provider]  memantine (NAMENDA) 10 MG tablet TAKE ONE TABLET (10MG  TOTAL) BY MOUTH TWO TIMES DAILY Patient taking differently: Take 10 mg by mouth 2 (two) times daily.  07/17/19  Yes 07/19/19, NP  mirtazapine (REMERON) 15 MG tablet Take 15 mg by mouth at bedtime as needed. For insomnia and/or appetite 06/03/20  Yes [provider]  mirtazapine (REMERON) 7.5 MG tablet Take 7.5 mg by mouth at bedtime. (for abnormal weight loss) 06/03/20  Yes [provider]  calcium-vitamin D (OSCAL WITH D) 500-200 MG-UNIT tablet Take 1 tablet by mouth daily.    [provider]  Cholecalciferol (VITAMIN D-3 PO) Take 2,000 Units by mouth daily.     [provider]  folic acid (FOLVITE) 1 MG tablet Take 1 mg by mouth daily.    [provider]  hydrALAZINE (APRESOLINE) 25 MG tablet TAKE ONE TABLET BY MOUTH IN THE MORNING,TWO TABLETS AT NOON, AND ONE TABLET IN THE EVENING Patient not taking: Reported on 06/04/2020 07/29/19   Marinus Mawaylor, Gregg W, MD  Multiple Vitamins-Minerals (OCUVITE PO) Take 1 tablet by mouth 2 (two) times daily.    [provider]  ramipril (ALTACE) 5 MG capsule TAKE ONE CAPSULE (5MG  TOTAL) BY MOUTH DAILY Patient not taking: Reported on 06/04/2020 12/03/18   Marinus Mawaylor, Gregg W, MD  sertraline (ZOLOFT) 25 MG tablet Take 25 mg by mouth daily. Patient not taking: Reported on 06/04/2020    [provider]    Physical Exam: BP (!) 148/77   Pulse 80   Temp 98.1 F (36.7  C) (Oral)   Resp (!) 24   Ht 5\' 5"  (1.651 m)   Wt 55.7 kg   SpO2 98%   BMI 20.43 kg/m   . General: 86 y.o. year-old female well developed well nourished in no acute distress.  Alert and oriented x3. Marland Kitchen. HEENT: Dry mucous membrane, EOMI, NCAT . Neck: Supple, trachea medial . Cardiovascular: Pacemaker insertion site noted on left sided chest area.  Regular rate and rhythm with no rubs or gallops.  No thyromegaly or JVD noted.  No lower extremity edema. 2/4 pulses in all 4 extremities. Marland Kitchen. Respiratory: Clear to auscultation with no wheezes or rales. Good inspiratory effort. . Abdomen: Soft nontender nondistended with normal bowel sounds x4 quadrants. . Muskuloskeletal: No cyanosis, clubbing or edema noted bilaterally . Neuro: CN II-XII intact, strength, sensation, reflexes intact. . Skin: No ulcerative lesions noted or rashes . Psychiatry: Judgement and insight appear normal. Mood is appropriate for condition and setting          Labs on Admission:  Basic Metabolic Panel: Recent Labs  Lab 06/04/20 1915  NA 142  K 3.6  CL 107  CO2 29  GLUCOSE 143*  BUN 25*  CREATININE 0.89  CALCIUM 9.7   Liver Function Tests: Recent Labs  Lab 06/04/20 1915  AST 17  ALT 11  ALKPHOS 77  BILITOT 0.5  PROT 6.5  ALBUMIN 4.2   No results for input(s): LIPASE, AMYLASE in the last 168 hours. No results for input(s): AMMONIA in the last 168 hours. CBC: Recent Labs  Lab 06/04/20 1915  WBC 3.9*  NEUTROABS 1.9  HGB 11.8*  HCT 38.9  MCV 98.2  PLT 133*   Cardiac Enzymes: No results for input(s): CKTOTAL, CKMB, CKMBINDEX, TROPONINI in the last 168 hours.  BNP (last 3 results) Recent Labs    11/12/19 1300  BNP 195.0*    ProBNP (last 3 results) No results for input(s): PROBNP in the last 8760 hours.  CBG: No results for input(s): GLUCAP in the last 168 hours.  Radiological Exams on Admission: CT HEAD CODE STROKE WO CONTRAST  Result Date: 06/04/2020 CLINICAL DATA:  Code stroke.  Acute neuro deficit. Right-sided weakness, slurred speech. EXAM: CT HEAD WITHOUT CONTRAST TECHNIQUE: Contiguous axial images were obtained from the base of the skull through the vertex without intravenous contrast. COMPARISON:  CT head 11/20/2019 FINDINGS: Brain: Small area of acute hemorrhage in the left thalamus. Blood volume less than 1 cc. No other areas of hemorrhage Moderate atrophy. Moderate chronic microvascular ischemic changes unchanged from the prior study. Negative for mass or midline shift. Vascular: Atherosclerotic calcification in the carotid and vertebral arteries bilaterally. Negative for hyperdense  vessel Skull: Negative skull. Sinuses/Orbits: Paranasal sinuses clear. Bilateral cataract extraction Other: None ASPECTS ([place] Stroke Program Early CT Score) Not applicable due to acute hemorrhage. IMPRESSION: 1. Small focus of acute hemorrhage left thalamus. 2. Moderate atrophy and moderate chronic microvascular ischemic change. 3. These results were called by telephone at the time of interpretation on 06/04/2020 at 7:47 pm to provider [facility] ZAMMIT , who verbally acknowledged these results. Electronically Signed   By: Marlan Palau M.D.   On: 06/04/2020 19:49    EKG: I independently viewed the EKG done and my findings are as followed: Sinus rhythm with rate of 87 bpm with atrial premature complex.  Assessment/Plan Present on Admission: . Acute CVA (cerebrovascular accident) (HCC) . HTN (hypertension) . Hyperlipidemia . SSS (sick sinus syndrome) with PAT and marked bradycardia with 3 sec pauses  . Hypertensive emergency  Active Problems:   SSS (sick sinus syndrome) with PAT and marked bradycardia with 3 sec pauses    HTN (hypertension)   Hyperlipidemia   Hypertensive emergency   Pancytopenia (HCC)   Acute CVA (cerebrovascular accident) (HCC)   Hyperglycemia   Dehydration  Acute CVA CT of head without contrast showed a small focus of acute hemorrhage of the left  thalamus Neurologist on-call at [facility] was consulted and recommended keeping patient in any pain, control BP to SBP<140 and repeat CT of head without contrast in the morning per ED physician Continue telemetry  Echocardiogram to rule out intracardiac thrombus  and to evaluate EF will be done in the morning  Carotid ultrasound will be done in the morning  Consider tele-neurology consult based on above findings.  PT/OT eval pending, speech eval pending,  Use Hydralazine 5 mg iv every 4 hrs as needed for systolic blood pressure over 140 mmhg  TSH, A1c and fasting lipid profile will be checked Continue fall precaution and neurochecks Continue n.p.o. until patient passes stroke swallow screen  Hypertensive emergency in the setting of above CT of head without contrast showed a small focus of acute hemorrhage of the left thalamus Maintain SBP < 140 per neurology consulted per ED physician  Continue IV hydralazine 5 mg every 6 hours as needed   Dehydration  Continue IV hydration  Hyperglycemia possibly reactive BG 143, patient has no history of type 2 diabetes mellitus Hemoglobin A1c pending, continue to monitor glucose level with morning labs  Pancytopenia WBC 3.9, hemoglobin 11.8 (though HCT is 38.9-normal), platelets 133 Patient presents with small focus of acute hemorrhage as described above DVT prophylaxis will be held at this time except for SCDs Continue to monitor platelet level with morning labs  DVT prophylaxis: SCDs  Code Status: Full code  Family Communication: Daughter in law at bedside (all questions answered to satisfaction)  Disposition Plan:  Patient is from:                        SNF Anticipated DC to:                   SNF  Anticipated DC date:                1 day Anticipated DC barriers:          Patient is unstable to be discharged at this time due to acute CVA with small focus of acute hemorrhage of left thalamus requiring further stroke work-up   Consults  called: None  Admission status: Observation    Frankey Shown MD [facility]  06/04/2020, 10:41 PM

## 2020-06-05 ENCOUNTER — Observation Stay (HOSPITAL_COMMUNITY): Payer: Medicare Other

## 2020-06-05 ENCOUNTER — Observation Stay (HOSPITAL_BASED_OUTPATIENT_CLINIC_OR_DEPARTMENT_OTHER): Payer: Medicare Other

## 2020-06-05 DIAGNOSIS — Z8249 Family history of ischemic heart disease and other diseases of the circulatory system: Secondary | ICD-10-CM | POA: Diagnosis not present

## 2020-06-05 DIAGNOSIS — G319 Degenerative disease of nervous system, unspecified: Secondary | ICD-10-CM | POA: Diagnosis not present

## 2020-06-05 DIAGNOSIS — I161 Hypertensive emergency: Secondary | ICD-10-CM | POA: Diagnosis present

## 2020-06-05 DIAGNOSIS — I629 Nontraumatic intracranial hemorrhage, unspecified: Secondary | ICD-10-CM | POA: Diagnosis present

## 2020-06-05 DIAGNOSIS — R4781 Slurred speech: Secondary | ICD-10-CM | POA: Diagnosis present

## 2020-06-05 DIAGNOSIS — M810 Age-related osteoporosis without current pathological fracture: Secondary | ICD-10-CM | POA: Diagnosis present

## 2020-06-05 DIAGNOSIS — I2510 Atherosclerotic heart disease of native coronary artery without angina pectoris: Secondary | ICD-10-CM | POA: Diagnosis present

## 2020-06-05 DIAGNOSIS — Z96642 Presence of left artificial hip joint: Secondary | ICD-10-CM | POA: Diagnosis present

## 2020-06-05 DIAGNOSIS — I639 Cerebral infarction, unspecified: Secondary | ICD-10-CM | POA: Diagnosis not present

## 2020-06-05 DIAGNOSIS — E785 Hyperlipidemia, unspecified: Secondary | ICD-10-CM | POA: Diagnosis present

## 2020-06-05 DIAGNOSIS — I6523 Occlusion and stenosis of bilateral carotid arteries: Secondary | ICD-10-CM | POA: Diagnosis not present

## 2020-06-05 DIAGNOSIS — H3530 Unspecified macular degeneration: Secondary | ICD-10-CM | POA: Diagnosis present

## 2020-06-05 DIAGNOSIS — Z8673 Personal history of transient ischemic attack (TIA), and cerebral infarction without residual deficits: Secondary | ICD-10-CM | POA: Diagnosis not present

## 2020-06-05 DIAGNOSIS — Z86718 Personal history of other venous thrombosis and embolism: Secondary | ICD-10-CM | POA: Diagnosis not present

## 2020-06-05 DIAGNOSIS — I6389 Other cerebral infarction: Secondary | ICD-10-CM

## 2020-06-05 DIAGNOSIS — Z955 Presence of coronary angioplasty implant and graft: Secondary | ICD-10-CM | POA: Diagnosis not present

## 2020-06-05 DIAGNOSIS — I495 Sick sinus syndrome: Secondary | ICD-10-CM | POA: Diagnosis present

## 2020-06-05 DIAGNOSIS — I619 Nontraumatic intracerebral hemorrhage, unspecified: Secondary | ICD-10-CM | POA: Diagnosis not present

## 2020-06-05 DIAGNOSIS — I610 Nontraumatic intracerebral hemorrhage in hemisphere, subcortical: Secondary | ICD-10-CM

## 2020-06-05 DIAGNOSIS — U071 COVID-19: Secondary | ICD-10-CM | POA: Diagnosis not present

## 2020-06-05 DIAGNOSIS — Z90710 Acquired absence of both cervix and uterus: Secondary | ICD-10-CM | POA: Diagnosis not present

## 2020-06-05 DIAGNOSIS — D61818 Other pancytopenia: Secondary | ICD-10-CM | POA: Diagnosis present

## 2020-06-05 DIAGNOSIS — R5381 Other malaise: Secondary | ICD-10-CM | POA: Diagnosis not present

## 2020-06-05 DIAGNOSIS — Z95810 Presence of automatic (implantable) cardiac defibrillator: Secondary | ICD-10-CM | POA: Diagnosis not present

## 2020-06-05 DIAGNOSIS — Z79899 Other long term (current) drug therapy: Secondary | ICD-10-CM | POA: Diagnosis not present

## 2020-06-05 DIAGNOSIS — I69351 Hemiplegia and hemiparesis following cerebral infarction affecting right dominant side: Secondary | ICD-10-CM | POA: Diagnosis not present

## 2020-06-05 DIAGNOSIS — I10 Essential (primary) hypertension: Secondary | ICD-10-CM | POA: Diagnosis present

## 2020-06-05 DIAGNOSIS — R739 Hyperglycemia, unspecified: Secondary | ICD-10-CM | POA: Diagnosis present

## 2020-06-05 DIAGNOSIS — E860 Dehydration: Secondary | ICD-10-CM | POA: Diagnosis present

## 2020-06-05 DIAGNOSIS — R531 Weakness: Secondary | ICD-10-CM | POA: Diagnosis not present

## 2020-06-05 DIAGNOSIS — F0390 Unspecified dementia without behavioral disturbance: Secondary | ICD-10-CM | POA: Diagnosis present

## 2020-06-05 DIAGNOSIS — R269 Unspecified abnormalities of gait and mobility: Secondary | ICD-10-CM | POA: Diagnosis present

## 2020-06-05 DIAGNOSIS — Z86010 Personal history of colonic polyps: Secondary | ICD-10-CM | POA: Diagnosis not present

## 2020-06-05 LAB — CBC
HCT: 39.1 % (ref 36.0–46.0)
Hemoglobin: 11.9 g/dL — ABNORMAL LOW (ref 12.0–15.0)
MCH: 29.5 pg (ref 26.0–34.0)
MCHC: 30.4 g/dL (ref 30.0–36.0)
MCV: 97 fL (ref 80.0–100.0)
Platelets: 132 10*3/uL — ABNORMAL LOW (ref 150–400)
RBC: 4.03 MIL/uL (ref 3.87–5.11)
RDW: 14.6 % (ref 11.5–15.5)
WBC: 4 10*3/uL (ref 4.0–10.5)
nRBC: 0 % (ref 0.0–0.2)

## 2020-06-05 LAB — RESPIRATORY PANEL BY RT PCR (FLU A&B, COVID)
Influenza A by PCR: NEGATIVE
Influenza B by PCR: NEGATIVE
SARS Coronavirus 2 by RT PCR: POSITIVE — AB

## 2020-06-05 LAB — LIPID PANEL
Cholesterol: 260 mg/dL — ABNORMAL HIGH (ref 0–200)
HDL: 92 mg/dL (ref >40)
LDL Cholesterol: 160 mg/dL — ABNORMAL HIGH (ref 0–99)
Total CHOL/HDL Ratio: 2.8 RATIO
Triglycerides: 39 mg/dL (ref <150)
VLDL: 8 mg/dL (ref 0–40)

## 2020-06-05 LAB — COMPREHENSIVE METABOLIC PANEL
ALT: 10 U/L (ref 0–44)
AST: 16 U/L (ref 15–41)
Albumin: 4 g/dL (ref 3.5–5.0)
Alkaline Phosphatase: 74 U/L (ref 38–126)
Anion gap: 10 (ref 5–15)
BUN: 23 mg/dL (ref 8–23)
CO2: 26 mmol/L (ref 22–32)
Calcium: 9.6 mg/dL (ref 8.9–10.3)
Chloride: 107 mmol/L (ref 98–111)
Creatinine, Ser: 0.77 mg/dL (ref 0.44–1.00)
GFR, Estimated: >60 mL/min (ref >60)
Glucose, Bld: 118 mg/dL — ABNORMAL HIGH (ref 70–99)
Potassium: 3.5 mmol/L (ref 3.5–5.1)
Sodium: 143 mmol/L (ref 135–145)
Total Bilirubin: 0.8 mg/dL (ref 0.3–1.2)
Total Protein: 6.1 g/dL — ABNORMAL LOW (ref 6.5–8.1)

## 2020-06-05 LAB — HEMOGLOBIN A1C
Hgb A1c MFr Bld: 5.2 % (ref 4.8–5.6)
Mean Plasma Glucose: 102.54 mg/dL

## 2020-06-05 LAB — APTT: aPTT: 29 seconds (ref 24–36)

## 2020-06-05 LAB — MRSA PCR SCREENING: MRSA by PCR: NEGATIVE

## 2020-06-05 LAB — PROTIME-INR
INR: 1.1 (ref 0.8–1.2)
Prothrombin Time: 13.9 seconds (ref 11.4–15.2)

## 2020-06-05 LAB — MAGNESIUM: Magnesium: 2 mg/dL (ref 1.7–2.4)

## 2020-06-05 LAB — PHOSPHORUS: Phosphorus: 2.9 mg/dL (ref 2.5–4.6)

## 2020-06-05 LAB — ECHOCARDIOGRAM COMPLETE
Height: 65 in
S' Lateral: 3.19 cm
Weight: 1964.74 oz

## 2020-06-05 LAB — TSH: TSH: 1.737 u[IU]/mL (ref 0.350–4.500)

## 2020-06-05 MED ORDER — ALBUTEROL SULFATE HFA 108 (90 BASE) MCG/ACT IN AERS: 2.0000 | INHALATION_SPRAY | Freq: Once | RESPIRATORY_TRACT | Status: DC | PRN

## 2020-06-05 MED ORDER — MEMANTINE HCL 10 MG PO TABS
10.0000 mg | ORAL_TABLET | Freq: Two times a day (BID) | ORAL | Status: DC
  Administered 2020-06-05 – 2020-06-06 (×3): 10 mg via ORAL
  Filled 2020-06-05 (×3): qty 1

## 2020-06-05 MED ORDER — CARVEDILOL 3.125 MG PO TABS
3.1250 mg | ORAL_TABLET | Freq: Two times a day (BID) | ORAL | Status: DC
  Administered 2020-06-05 (×2): 3.125 mg via ORAL
  Filled 2020-06-05 (×2): qty 1

## 2020-06-05 MED ORDER — METHYLPREDNISOLONE SODIUM SUCC 125 MG IJ SOLR: 125.0000 mg | Freq: Once | INTRAMUSCULAR | Status: DC | PRN

## 2020-06-05 MED ORDER — CHLORHEXIDINE GLUCONATE CLOTH 2 % EX PADS
6.0000 | MEDICATED_PAD | Freq: Every day | CUTANEOUS | Status: DC
  Administered 2020-06-05 – 2020-06-06 (×2): 6 via TOPICAL

## 2020-06-05 MED ORDER — FAMOTIDINE IN NACL 20-0.9 MG/50ML-% IV SOLN: 20.0000 mg | Freq: Once | INTRAVENOUS | Status: DC | PRN

## 2020-06-05 MED ORDER — SODIUM CHLORIDE 0.9 % IV SOLN: 1200.0000 mg | Freq: Once | INTRAVENOUS | Status: DC

## 2020-06-05 MED ORDER — SODIUM CHLORIDE 0.9 % IV SOLN: INTRAVENOUS | Status: DC | PRN

## 2020-06-05 MED ORDER — EPINEPHRINE 0.3 MG/0.3ML IJ SOAJ: 0.3000 mg | Freq: Once | INTRAMUSCULAR | Status: DC | PRN

## 2020-06-05 MED ORDER — DIPHENHYDRAMINE HCL 50 MG/ML IJ SOLN: 50.0000 mg | Freq: Once | INTRAMUSCULAR | Status: DC | PRN

## 2020-06-05 MED ORDER — DONEPEZIL HCL 5 MG PO TABS
10.0000 mg | ORAL_TABLET | Freq: Every day | ORAL | Status: DC
  Administered 2020-06-05: 10 mg via ORAL
  Filled 2020-06-05: qty 2

## 2020-06-05 MED ORDER — RAMIPRIL 1.25 MG PO CAPS
2.5000 mg | ORAL_CAPSULE | Freq: Every day | ORAL | Status: DC
  Administered 2020-06-05 – 2020-06-06 (×2): 2.5 mg via ORAL
  Filled 2020-06-05: qty 2
  Filled 2020-06-05 (×3): qty 1
  Filled 2020-06-05: qty 2

## 2020-06-05 NOTE — ED Notes (Signed)
Pt daughter out to [place]ask multiple times  Manipulating pump and IV site in spite of request to cease

## 2020-06-05 NOTE — ED Notes (Signed)
Inadvertantly pulled up endotool on wrong pt

## 2020-06-05 NOTE — Progress Notes (Addendum)
Tele coverage phone consultation  I was called by Dr. Sherryll Burger, hospitalist at [facility] regarding this patient. She had presented yesterday evening in the ED with slurred speech and right-sided weakness which are improving. Noncontrasted head CT showed a small left thalamic capsular junction ICH with no IVH. At baseline, she is extremely disabled and is at a facility. At this point, the mainstay of treatment would be blood pressure management. Since she is at least 24 hours from her bleed and the bleed is stable on repeat head scan which I have reviewed, blood pressure goals should be systolic blood pressure less than 160. I do not think she needs any further work-up from a neurological standpoint or transfer to a comprehensive stroke center. I discussed my plan with Dr. Sherryll Burger. Presumably the case was discussed by the ED provider with the on-call overnight neuro hospitalist at [facility] and recommendations were made to stay at [facility] which I agree with. I do not see an ED provider note in the chart at the time of this dictation to get any more details of the conversation.  Impression: Small left thalamic capsular ICH-likely hypertensive in etiology ICH score 0  Recommendations: -Blood pressure goal systolic less than 160. -PT OT -No antiplatelets or anticoagulants -Follow-up with outpatient PCP and neurology  -- Milon Dikes, MD [facility] Pager: [phone]

## 2020-06-05 NOTE — ED Notes (Signed)
Pt is stable at this time

## 2020-06-05 NOTE — ED Notes (Signed)
Date and time results received: 06/05/20 0250  Test:Covid Critical Value: Positive  Name of Provider Notified:Dr. Lynelle Doctor  Orders Received? Or Actions Taken?: Airborne and Contact precautions initiated

## 2020-06-05 NOTE — Progress Notes (Signed)
  Echocardiogram 2D Echocardiogram has been performed.  Pamela [name] 06/05/2020, 9:22 AM

## 2020-06-05 NOTE — Progress Notes (Addendum)
PROGRESS NOTE    Pamela [name]  OVZ:[id number] DOB: 05/10/1921 DOA: 06/04/2020 PCP: Pcp, No   Brief Narrative:  Per HPI:  Pamela [name] is a 86 y.o. female with medical history significant for tachybradycardia syndrome status post pacemaker, previous left hip replacement, hypertension, hyperlipidemia and coronary artery disease who presented to ED via EMS. Patient states that she does not remember why she came to the ED, History was provided by ED physician, ED medical record and daughter in law at bedside.  Per report, patient was noted to present with right-sided weakness with leaning to the right side and with transitory right facial drooping and slurred speech at the ALF. Last known well was 11 AM.  EMS was activated and patient was taken to the ED for further evaluation and management.  Patient denies blurry vision, headache, chest pain, shortness of breath, numbness or tingling.  ED Course:  In the emergency department, blood pressure was 196/101 on arrival, otherwise patient was hemodynamically stable.  Work-up in the ED showed leukopenia, thrombocytopenia, hyperglycemia, BUN 25, creatinine 0.89.  Alcohol level was <10.  CT of head without contrast showed a small focus of acute hemorrhage of the left thalamus, moderate atrophy and moderate chronic microvascular ischemic change noted.  Neurologist on-call at [facility] was consulted and recommended giving clevidipine and to maintain patient's SBP <140 with recommendation to repeat CT of head in the morning and that there was no indication for patient to be transferred to [facility] at this time per ED physician.  IV hydralazine 5 mg x 2 was also given.  Hospitalist was asked to admit patient for further evaluation and management.  11/6: Patient was admitted with small left thalamic capsular intracranial hemorrhage that was likely related to hypertension.  This appears to have been associated with small deficits which appear to have resolved.   Unfortunately, patient has limited independence and mobility at her facility.  Family members would like for PT and OT to evaluate further.  2D echocardiogram has been performed with results pending.  Carotid ultrasound with mild atherosclerotic lesions noted.  Plan to wean off clevidipine and monitor blood pressures closely.  Assessment & Plan:   Active Problems:   SSS (sick sinus syndrome) with PAT and marked bradycardia with 3 sec pauses    HTN (hypertension)   Hyperlipidemia   Hypertensive emergency   Pancytopenia (HCC)   Acute CVA (cerebrovascular accident) (HCC)   Hyperglycemia   Dehydration   Small left thalamic capsular ICH -Likely hypertensive in etiology -Maintain blood pressure goal systolic less than 160 as discussed with neurology -Avoid antiplatelets or anticoagulants -PT/OT evaluation ordered and pending -TSH 1.7 -A1c 5.2% -LDL 160 -SLP evaluation pending, continue on dysphagia 1 diet for now  Hypertensive emergency related to above -Wean off clevidipine as tolerated -IV hydralazine ordered to assist with this process -Resume home ACE inhibitor and beta-blocker to assist with this process and adjust medications to maintain adequate blood pressure readings  COVID-19 infection -Patient is high risk, but otherwise asymptomatic -Monoclonal antibody to be given  Pancytopenia -Continue to monitor   DVT prophylaxis: SCDs Code Status: Full code Family Communication: Daughter-in-law at bedside and discussed with son on phone 11/6 Disposition Plan:  Status is: Observation  The patient will require care spanning > 2 midnights and should be moved to inpatient because: IV treatments appropriate due to intensity of illness or inability to take PO and Inpatient level of care appropriate due to severity of illness  Dispo: The patient is  from: ALF              Anticipated d/c is to: ALF              Anticipated d/c date is: 1 day              Patient currently is not  medically stable to d/c.  Patient needs close blood pressure management and PT/OT evaluation prior to discharge.   Consultants:   Discussed with neurology Dr. Wilford Corner on 11/6  Procedures:   See below  Antimicrobials:   None   Subjective: Patient seen and evaluated today with no new acute complaints or concerns. No acute concerns or events noted overnight.  She appears to have had improvement in her symptoms this morning and her blood pressures are improved.  Objective: Vitals:   06/05/20 1145 06/05/20 1158 06/05/20 1200 06/05/20 1236  BP:   (!) 142/81 129/73  Pulse: 89  91 85  Resp: (!) 21  18 16   Temp:  98.7 F (37.1 C)    TempSrc:  Oral    SpO2: 96%  96% 96%  Weight:      Height:        Intake/Output Summary (Last 24 hours) at 06/05/2020 1251 Last data filed at 06/05/2020 1211 Gross per 24 hour  Intake --  Output 1100 ml  Net -1100 ml   Filed Weights   06/04/20 1923  Weight: 55.7 kg    Examination:  General exam: Appears calm and comfortable, appears confused Respiratory system: Clear to auscultation. Respiratory effort normal. Cardiovascular system: S1 & S2 heard, RRR.  Gastrointestinal system: Abdomen is nondistended, soft and nontender.  Central nervous system: Alert and awake Extremities: No edema Skin: No rashes, lesions or ulcers Psychiatry: Judgement and insight appear normal. Mood & affect appropriate.     Data Reviewed: I have personally reviewed following labs and imaging studies  CBC: Recent Labs  Lab 06/04/20 1915 06/05/20 0459  WBC 3.9* 4.0  NEUTROABS 1.9  --   HGB 11.8* 11.9*  HCT 38.9 39.1  MCV 98.2 97.0  PLT 133* 132*   Basic Metabolic Panel: Recent Labs  Lab 06/04/20 1915 06/05/20 0459  NA 142 143  K 3.6 3.5  CL 107 107  CO2 29 26  GLUCOSE 143* 118*  BUN 25* 23  CREATININE 0.89 0.77  CALCIUM 9.7 9.6  MG  --  2.0  PHOS  --  2.9   GFR: Estimated Creatinine Clearance: 35.3 mL/min (by C-G formula based on SCr of 0.77  mg/dL). Liver Function Tests: Recent Labs  Lab 06/04/20 1915 06/05/20 0459  AST 17 16  ALT 11 10  ALKPHOS 77 74  BILITOT 0.5 0.8  PROT 6.5 6.1*  ALBUMIN 4.2 4.0   No results for input(s): LIPASE, AMYLASE in the last 168 hours. No results for input(s): AMMONIA in the last 168 hours. Coagulation Profile: Recent Labs  Lab 06/04/20 1915 06/05/20 0459  INR 1.1 1.1   Cardiac Enzymes: No results for input(s): CKTOTAL, CKMB, CKMBINDEX, TROPONINI in the last 168 hours. BNP (last 3 results) No results for input(s): PROBNP in the last 8760 hours. HbA1C: Recent Labs    06/05/20 0459  HGBA1C 5.2   CBG: No results for input(s): GLUCAP in the last 168 hours. Lipid Profile: Recent Labs    06/05/20 0459  CHOL 260*  HDL 92  LDLCALC 160*  TRIG 39  CHOLHDL 2.8   Thyroid Function Tests: Recent Labs    06/05/20 0459  TSH 1.737   Anemia Panel: No results for input(s): VITAMINB12, FOLATE, FERRITIN, TIBC, IRON, RETICCTPCT in the last 72 hours. Sepsis Labs: No results for input(s): PROCALCITON, LATICACIDVEN in the last 168 hours.  Recent Results (from the past 240 hour(s))  Respiratory Panel by RT PCR (Flu A&B, Covid) - Nasopharyngeal Swab     Status: Abnormal   Collection Time: 06/05/20 12:20 AM   Specimen: Nasopharyngeal Swab  Result Value Ref Range Status   SARS Coronavirus 2 by RT PCR POSITIVE (A) NEGATIVE Final    Comment: RESULT CALLED TO, READ BACK BY AND VERIFIED WITH: MCKINNEY,A @ 0248 ON 06/05/20 BY JUW (NOTE) SARS-CoV-2 target nucleic acids are DETECTED.  SARS-CoV-2 RNA is generally detectable in upper respiratory specimens  during the acute phase of infection. Positive results are indicative of the presence of the identified virus, but do not rule out bacterial infection or co-infection with other pathogens not detected by the test. Clinical correlation with patient history and other diagnostic information is necessary to determine patient infection status.  The expected result is Negative.  Fact Sheet for Patients:  https://www.moore.com/  Fact Sheet for Healthcare Providers: https://www.young.biz/  This test is not yet approved or cleared by the [place] FDA and  has been authorized for detection and/or diagnosis of SARS-CoV-2 by FDA under an Emergency Use Authorization (EUA).  This EUA will remain in effect (meaning this test can be  used) for the duration of  the COVID-19 declaration under Section 564(b)(1) of the Act, 21 U.S.C. section 360bbb-3(b)(1), unless the authorization is terminated or revoked sooner.      Influenza A by PCR NEGATIVE NEGATIVE Final   Influenza B by PCR NEGATIVE NEGATIVE Final    Comment: (NOTE) The Xpert Xpress SARS-CoV-2/FLU/RSV assay is intended as an aid in  the diagnosis of influenza from Nasopharyngeal swab specimens and  should not be used as a sole basis for treatment. Nasal washings and  aspirates are unacceptable for Xpert Xpress SARS-CoV-2/FLU/RSV  testing.  Fact Sheet for Patients: https://www.moore.com/  Fact Sheet for Healthcare Providers: https://www.young.biz/  This test is not yet approved or cleared by the [place] FDA and  has been authorized for detection and/or diagnosis of SARS-CoV-2 by  FDA under an Emergency Use Authorization (EUA). This EUA will remain  in effect (meaning this test can be used) for the duration of the  Covid-19 declaration under Section 564(b)(1) of the Act, 21  U.S.C. section 360bbb-3(b)(1), unless the authorization is  terminated or revoked. Performed at [facility], [address], [place], [place] [address]          Radiology Studies: CT HEAD WO CONTRAST  Result Date: 06/05/2020 CLINICAL DATA:  86 year old female with history of hemorrhagic stroke in the left thalamus. Follow-up study. EXAM: CT HEAD WITHOUT CONTRAST TECHNIQUE: Contiguous axial images were  obtained from the base of the skull through the vertex without intravenous contrast. COMPARISON:  Head CT 06/04/2020. FINDINGS: Brain: Mild cerebral atrophy. Patchy and confluent areas of decreased attenuation are noted throughout the deep and periventricular white matter of the cerebral hemispheres bilaterally, compatible with chronic microvascular ischemic disease. Previously noted small focus of high attenuation in the left thalamus is similar to the prior study measuring 9 x 6 mm (axial image 17 of series 2), compatible with a small hemorrhagic stroke. This exerts no significant surrounding mass effect at this time. No evidence of new acute infarction, hydrocephalus, extra-axial collection or mass lesion/mass effect. Vascular: No hyperdense vessel or unexpected calcification.  Skull: Normal. Negative for fracture or focal lesion. Sinuses/Orbits: No acute finding. Other: None. IMPRESSION: 1. Small hemorrhagic infarct in the left thalamus, similar to the prior study. 2. Mild cerebral atrophy with extensive chronic microvascular ischemic changes in the cerebral white matter, as above. Electronically Signed   By: Trudie Reed M.D.   On: 06/05/2020 10:25   US Carotid Bilateral  Result Date: 06/05/2020 CLINICAL DATA:  Acute ischemic stroke. EXAM: BILATERAL CAROTID DUPLEX ULTRASOUND TECHNIQUE: Wallace Cullens scale imaging, color Doppler and duplex ultrasound were performed of bilateral carotid and vertebral arteries in the neck. COMPARISON:  None. FINDINGS: Criteria: Quantification of carotid stenosis is based on velocity parameters that correlate the residual internal carotid diameter with NASCET-based stenosis levels, using the diameter of the distal internal carotid lumen as the denominator for stenosis measurement. The following velocity measurements were obtained: RIGHT ICA: 37/12 cm/sec CCA: 69/7 cm/sec SYSTOLIC ICA/CCA RATIO:  0.5 ECA: 80 cm/sec LEFT ICA: 39/10 cm/sec CCA: 44/7 cm/sec SYSTOLIC ICA/CCA RATIO:  0.9  ECA: 65 cm/sec RIGHT CAROTID ARTERY: There is a moderate amount of eccentric echogenic plaque within the right carotid bulb (image 14 and 16), extending to involve the origin and proximal aspects of the right internal carotid artery (image 24), not resulting in elevated peak systolic velocities within the interrogated course of the right internal carotid artery to suggest a hemodynamically significant stenosis. RIGHT VERTEBRAL ARTERY:  Antegrade flow LEFT CAROTID ARTERY: There is a minimal amount of eccentric mixed echogenic plaque within the left carotid bulb (image 50), not resulting in elevated peak systolic velocities within the interrogated course of the left internal carotid artery to suggest a hemodynamically significant stenosis. LEFT VERTEBRAL ARTERY:  Antegrade flow IMPRESSION: Minimal to moderate amount of bilateral atherosclerotic plaque, right greater than left, not resulting in a hemodynamically significant stenosis within either internal carotid artery. Electronically Signed   By: Simonne Come M.D.   On: 06/05/2020 10:03   ECHOCARDIOGRAM COMPLETE  Result Date: 06/05/2020    ECHOCARDIOGRAM REPORT   Patient Name:   Pamela [name] Date of Exam: 06/05/2020 Medical Rec #:  [id number]        Height:       65.0 in Accession #:    [id number]       Weight:       122.8 lb Date of Birth:  November 25, 1920        BSA:          1.608 m Patient Age:    89 years         BP:           126/76 mmHg Patient Gender: F                HR:           92 bpm. [facility] Procedure: 2D Echo Indications:    434.91 stroke  History:        Patient has prior history of Echocardiogram examinations, most                 recent 11/13/2019. CAD, Pacemaker and Defibrillator; Risk                 Factors:Hypertension and Dyslipidemia. SSS. covid +.  Sonographer:    Celene Skeen RDCS (AE) Referring Phys: [id number] OLADAPO ADEFESO  Sonographer Comments: Restricted mobility. IMPRESSIONS  1. Left ventricular ejection fraction, by  estimation, is 60 to 65%. The left ventricle has normal function. The left ventricle has  no regional wall motion abnormalities. There is moderate left ventricular hypertrophy. Left ventricular diastolic parameters are indeterminate.  2. Right ventricular systolic function is normal. The right ventricular size is normal. A device wire is visualized. Tricuspid regurgitation signal is inadequate for assessing PA pressure.  3. Left atrial size was mildly dilated.  4. The mitral valve is grossly normal. Trivial mitral valve regurgitation.  5. The aortic valve is tricuspid. Aortic valve regurgitation is trivial. Mild to moderate aortic valve sclerosis/calcification is present, without any evidence of aortic stenosis.  6. The inferior vena cava is normal in size with <50% respiratory variability, suggesting right atrial pressure of 8 mmHg. FINDINGS  Left Ventricle: Left ventricular ejection fraction, by estimation, is 60 to 65%. The left ventricle has normal function. The left ventricle has no regional wall motion abnormalities. The left ventricular internal cavity size was normal in size. There is  moderate left ventricular hypertrophy. Left ventricular diastolic parameters are indeterminate. Right Ventricle: The right ventricular size is normal. No increase in right ventricular wall thickness. Right ventricular systolic function is normal. Tricuspid regurgitation signal is inadequate for assessing PA pressure. Left Atrium: Left atrial size was mildly dilated. Right Atrium: Right atrial size was normal in size. Pericardium: There is no evidence of pericardial effusion. Mitral Valve: The mitral valve is grossly normal. Trivial mitral valve regurgitation. Tricuspid Valve: The tricuspid valve is grossly normal. Tricuspid valve regurgitation is trivial. Aortic Valve: The aortic valve is tricuspid. Aortic valve regurgitation is trivial. Mild to moderate aortic valve sclerosis/calcification is present, without any evidence of  aortic stenosis. Pulmonic Valve: The pulmonic valve was not well visualized. Pulmonic valve regurgitation is trivial. Aorta: The aortic root is normal in size and structure. Venous: The inferior vena cava is normal in size with less than 50% respiratory variability, suggesting right atrial pressure of 8 mmHg. IAS/Shunts: No atrial level shunt detected by color flow Doppler. Additional Comments: A device wire is visualized.  LEFT VENTRICLE PLAX 2D LVIDd:         4.11 cm LVIDs:         3.18 cm LV PW:         1.27 cm LV IVS:        1.35 cm LVOT diam:     2.20 cm LV SV:         90 LV SV Index:   56 LVOT Area:     3.80 cm  RIGHT VENTRICLE RV S prime:     13.30 cm/s TAPSE (M-mode): 1.6 cm LEFT ATRIUM             Index       RIGHT ATRIUM           Index LA diam:        3.60 cm 2.24 cm/m  RA Area:     15.70 cm LA Vol (A2C):   62.3 ml 38.75 ml/m RA Volume:   36.40 ml  22.64 ml/m LA Vol (A4C):   56.0 ml 34.83 ml/m LA Biplane Vol: 61.7 ml 38.38 ml/m  AORTIC VALVE LVOT Vmax:   103.00 cm/s LVOT Vmean:  75.100 cm/s LVOT VTI:    0.237 m  AORTA Ao Root diam: 3.40 cm  SHUNTS Systemic VTI:  0.24 m Systemic Diam: 2.20 cm Nona DellSamuel Mcdowell MD Electronically signed by Nona DellSamuel Mcdowell MD Signature Date/Time: 06/05/2020/12:15:35 PM    Final    CT HEAD CODE STROKE WO CONTRAST  Result Date: 06/04/2020 CLINICAL DATA:  Code stroke. Acute neuro deficit.  Right-sided weakness, slurred speech. EXAM: CT HEAD WITHOUT CONTRAST TECHNIQUE: Contiguous axial images were obtained from the base of the skull through the vertex without intravenous contrast. COMPARISON:  CT head 11/20/2019 FINDINGS: Brain: Small area of acute hemorrhage in the left thalamus. Blood volume less than 1 cc. No other areas of hemorrhage Moderate atrophy. Moderate chronic microvascular ischemic changes unchanged from the prior study. Negative for mass or midline shift. Vascular: Atherosclerotic calcification in the carotid and vertebral arteries bilaterally. Negative for  hyperdense vessel Skull: Negative skull. Sinuses/Orbits: Paranasal sinuses clear. Bilateral cataract extraction Other: None ASPECTS ([place] Stroke Program Early CT Score) Not applicable due to acute hemorrhage. IMPRESSION: 1. Small focus of acute hemorrhage left thalamus. 2. Moderate atrophy and moderate chronic microvascular ischemic change. 3. These results were called by telephone at the time of interpretation on 06/04/2020 at 7:47 pm to provider [facility] ZAMMIT , who verbally acknowledged these results. Electronically Signed   By: Marlan Palau M.D.   On: 06/04/2020 19:49        Scheduled Meds: . carvedilol  3.125 mg Oral BID  . donepezil  10 mg Oral QHS  . hydrALAZINE  5 mg Intravenous Once  . memantine  10 mg Oral BID  . ramipril  2.5 mg Oral Daily   Continuous Infusions: . sodium chloride    . clevidipine 3 mg/hr (06/05/20 0629)  . famotidine (PEPCID) IV       LOS: 0 days    Time spent: 35 minutes    Hanny Elsberry Hoover Brunette, DO [facility]  If 7PM-7AM, please contact night-coverage [email or web address] 06/05/2020, 12:51 PM

## 2020-06-05 NOTE — ED Notes (Signed)
Report to Lawrence, RN 

## 2020-06-05 NOTE — ED Notes (Signed)
Purewick placed on pt. 

## 2020-06-06 ENCOUNTER — Encounter (HOSPITAL_COMMUNITY): Payer: Self-pay | Admitting: Internal Medicine

## 2020-06-06 ENCOUNTER — Emergency Department (HOSPITAL_COMMUNITY)
Admission: EM | Admit: 2020-06-06 | Discharge: 2020-06-06 | Discharge status: Absent without leave | Payer: Medicare Other

## 2020-06-06 ENCOUNTER — Inpatient Hospital Stay (HOSPITAL_COMMUNITY)
Admit: 2020-06-06 | Discharge: 2020-06-08 | Disposition: A | Discharge status: Skilled Nursing Facility | Payer: Medicare Other | Source: Skilled Nursing Facility | Attending: Internal Medicine | Admitting: Internal Medicine

## 2020-06-06 ENCOUNTER — Other Ambulatory Visit: Payer: Self-pay

## 2020-06-06 DIAGNOSIS — I639 Cerebral infarction, unspecified: Secondary | ICD-10-CM | POA: Diagnosis present

## 2020-06-06 MED ORDER — CARVEDILOL 3.125 MG PO TABS
6.2500 mg | ORAL_TABLET | Freq: Two times a day (BID) | ORAL | Status: DC
  Administered 2020-06-06: 6.25 mg via ORAL
  Filled 2020-06-06: qty 2

## 2020-06-06 MED ORDER — SERTRALINE HCL 50 MG PO TABS
25.0000 mg | ORAL_TABLET | Freq: Every day | ORAL | Status: DC
  Administered 2020-06-07 – 2020-06-08 (×2): 25 mg via ORAL
  Filled 2020-06-06 (×2): qty 1

## 2020-06-06 MED ORDER — CARVEDILOL 6.25 MG PO TABS: 6.2500 mg | ORAL_TABLET | Freq: Two times a day (BID) | ORAL | 3 refills | Status: AC

## 2020-06-06 MED ORDER — CARVEDILOL 3.125 MG PO TABS
6.2500 mg | ORAL_TABLET | Freq: Two times a day (BID) | ORAL | Status: DC
  Administered 2020-06-06 – 2020-06-08 (×4): 6.25 mg via ORAL
  Filled 2020-06-06 (×4): qty 2

## 2020-06-06 MED ORDER — FERROUS SULFATE 325 (65 FE) MG PO TABS
325.0000 mg | ORAL_TABLET | Freq: Every day | ORAL | Status: DC
  Administered 2020-06-07 – 2020-06-08 (×2): 325 mg via ORAL
  Filled 2020-06-06 (×2): qty 1

## 2020-06-06 MED ORDER — ACETAMINOPHEN 325 MG PO TABS: 650.0000 mg | ORAL_TABLET | Freq: Four times a day (QID) | ORAL | Status: DC | PRN

## 2020-06-06 MED ORDER — DONEPEZIL HCL 5 MG PO TABS
10.0000 mg | ORAL_TABLET | Freq: Every day | ORAL | Status: DC
  Administered 2020-06-07: 10 mg via ORAL
  Filled 2020-06-06: qty 2

## 2020-06-06 MED ORDER — MEMANTINE HCL 10 MG PO TABS
10.0000 mg | ORAL_TABLET | Freq: Two times a day (BID) | ORAL | Status: DC
  Administered 2020-06-07 – 2020-06-08 (×3): 10 mg via ORAL
  Filled 2020-06-06 (×3): qty 1

## 2020-06-06 MED ORDER — ONDANSETRON HCL 4 MG PO TABS: 4.0000 mg | ORAL_TABLET | Freq: Four times a day (QID) | ORAL | Status: DC | PRN

## 2020-06-06 MED ORDER — ACETAMINOPHEN 650 MG RE SUPP: 650.0000 mg | Freq: Four times a day (QID) | RECTAL | Status: DC | PRN

## 2020-06-06 MED ORDER — SODIUM CHLORIDE 0.9% FLUSH
3.0000 mL | Freq: Two times a day (BID) | INTRAVENOUS | Status: DC
  Administered 2020-06-06 – 2020-06-07 (×4): 3 mL via INTRAVENOUS

## 2020-06-06 MED ORDER — DOCUSATE SODIUM 100 MG PO CAPS
100.0000 mg | ORAL_CAPSULE | Freq: Two times a day (BID) | ORAL | Status: DC
  Administered 2020-06-06 – 2020-06-08 (×4): 100 mg via ORAL
  Filled 2020-06-06 (×4): qty 1

## 2020-06-06 MED ORDER — CALCIUM CARBONATE-VITAMIN D 500-200 MG-UNIT PO TABS
1.0000 | ORAL_TABLET | Freq: Every day | ORAL | Status: DC
  Administered 2020-06-07 – 2020-06-08 (×2): 1 via ORAL
  Filled 2020-06-06 (×3): qty 1

## 2020-06-06 MED ORDER — FOLIC ACID 1 MG PO TABS
1.0000 mg | ORAL_TABLET | Freq: Every day | ORAL | Status: DC
  Administered 2020-06-07 – 2020-06-08 (×2): 1 mg via ORAL
  Filled 2020-06-06 (×3): qty 1

## 2020-06-06 MED ORDER — LABETALOL HCL 5 MG/ML IV SOLN: 10.0000 mg | INTRAVENOUS | Status: DC | PRN

## 2020-06-06 MED ORDER — SODIUM CHLORIDE 0.9 % IV SOLN: 250.0000 mL | INTRAVENOUS | Status: DC | PRN

## 2020-06-06 MED ORDER — ONDANSETRON HCL 4 MG/2ML IJ SOLN: 4.0000 mg | Freq: Four times a day (QID) | INTRAMUSCULAR | Status: DC | PRN

## 2020-06-06 MED ORDER — RAMIPRIL 5 MG PO CAPS
5.0000 mg | ORAL_CAPSULE | Freq: Every day | ORAL | Status: DC
  Administered 2020-06-07 – 2020-06-08 (×2): 5 mg via ORAL
  Filled 2020-06-06 (×2): qty 1

## 2020-06-06 MED ORDER — SODIUM CHLORIDE 0.9% FLUSH: 3.0000 mL | INTRAVENOUS | Status: DC | PRN

## 2020-06-06 MED ORDER — HYDRALAZINE HCL 25 MG PO TABS
25.0000 mg | ORAL_TABLET | Freq: Three times a day (TID) | ORAL | Status: DC
  Administered 2020-06-07 – 2020-06-08 (×5): 25 mg via ORAL
  Filled 2020-06-06 (×5): qty 1

## 2020-06-06 NOTE — [place] FL2 (Signed)
[place] MEDICAID FL2 LEVEL OF CARE SCREENING TOOL     IDENTIFICATION  Patient Name: Pamela [name] Birthdate: 19-Mar-1921 Sex: female Admission Date (Current Location): 06/06/2020  [facility] and [place] Number:  [employer] and Address:  [facility],  [address] [place] [address]      Provider Number:    Attending Physician Name and Address:  Erick Blinks, DO  Relative Name and Phone Number:  Delena Bali [phone]    Current Level of Care: Hospital Recommended Level of Care: Skilled Nursing Facility Prior Approval Number:    Date Approved/Denied: 01/06/14 PASRR Number: [id number]  Discharge Plan: SNF    Current Diagnoses: Patient Active Problem List   Diagnosis Date Noted  . CVA (cerebral vascular accident) (HCC) 06/05/2020  . Acute CVA (cerebrovascular accident) (HCC) 06/04/2020  . Hyperglycemia 06/04/2020  . Dehydration 06/04/2020  . Pneumothorax, left 11/13/2019  . Dementia without behavioral disturbance (HCC)   . Palliative care by specialist   . Advanced care planning/counseling discussion   . Goals of care, counseling/discussion   . Syncope 11/12/2019  . Pancytopenia (HCC) 06/26/2017  . Gait disorder 03/05/2017  . Urinary frequency 08/10/2015  . Postoperative anemia due to acute blood loss 07/27/2015  . SVT (supraventricular tachycardia) (HCC) 07/26/2015  . Femur fracture, left (HCC) 07/24/2015  . Chronic kidney disease 07/24/2015  . Periprosthetic fracture around internal prosthetic left hip joint (HCC) 07/24/2015  . Fall at home   . DVT (deep venous thrombosis) (HCC) 01/30/2014  . Occult blood positive stool 01/30/2014  . Anemia, iron deficiency 01/06/2014  . Hip fracture, left (HCC) 01/03/2014  . QT prolongation 01/03/2014  . Atherosclerotic cardiovascular disease 08/13/2013  . Hypertensive emergency 04/08/2013  . Dizzy 04/08/2013  . PAT (paroxysmal atrial tachycardia) (HCC) 12/25/2012  . SSS (sick sinus  syndrome) with PAT and marked bradycardia with 3 sec pauses  08/30/2012  . Status post placement of cardiac pacemaker. 08/29/12 [employer]. Jude device 08/30/2012  . HTN (hypertension) 08/30/2012  . Hyperlipidemia 08/30/2012  . Osteoporosis, unspecified 04/19/2012  . Essential and other specified forms of tremor 04/19/2012  . Essential hypertension 04/19/2012  . Cardiac dysrhythmia 04/19/2012  . Chronic ischemic heart disease 04/19/2012  . Hereditary and idiopathic peripheral neuropathy 04/19/2012  . Memory loss 04/19/2012  . Disturbance of skin sensation 04/19/2012  . Pain in joint, shoulder region 11/20/2011  . Status post complete repair of rotator cuff 11/20/2011  . Muscle weakness (generalized) 11/20/2011    Orientation RESPIRATION BLADDER Height & Weight     Self  Normal Incontinent, External catheter Weight: 124 lb 12.5 oz (56.6 kg) Height:  5\' 5"  (165.1 cm)  BEHAVIORAL SYMPTOMS/MOOD NEUROLOGICAL BOWEL NUTRITION STATUS      Continent Diet (Diet Heart Room service appropriate? Yes; Fluid consistency: Thin)  AMBULATORY STATUS COMMUNICATION OF NEEDS Skin   Extensive Assist Verbally Other (Comment) (Pressure Wound stage 2 on buttock)                       Personal Care Assistance Level of Assistance  Bathing, Feeding, Dressing Bathing Assistance: Maximum assistance Feeding assistance: Limited assistance Dressing Assistance: Maximum assistance     Functional Limitations Info  Sight, Hearing, Speech Sight Info: Impaired Hearing Info: Adequate Speech Info: Impaired (Mildly slurred speech)    SPECIAL CARE FACTORS FREQUENCY  PT (By licensed PT)     PT Frequency: 5x              Contractures Contractures  Info: Not present    Additional Factors Info  Code Status, Allergies Code Status Info: Full Allergies Info: Gabapentin           Current Medications (06/06/2020):  This is the current hospital active medication list Current Facility-Administered Medications   Medication Dose Route Frequency Provider Last Rate Last Admin  . 0.9 %  sodium chloride infusion  250 mL Intravenous PRN Sherryll Burger, Pratik D, DO      . acetaminophen (TYLENOL) tablet 650 mg  650 mg Oral Q6H PRN Sherryll Burger, Pratik D, DO       Or  . acetaminophen (TYLENOL) suppository 650 mg  650 mg Rectal Q6H PRN Sherryll Burger, Pratik D, DO      . calcium-vitamin D (OSCAL WITH D) 500-200 MG-UNIT per tablet 1 tablet  1 tablet Oral Daily Shah, Pratik D, DO      . carvedilol (COREG) tablet 6.25 mg  6.25 mg Oral BID Sherryll Burger, Pratik D, DO      . docusate sodium (COLACE) capsule 100 mg  100 mg Oral BID Sherryll Burger, Pratik D, DO      . [START ON 06/07/2020] donepezil (ARICEPT) tablet 10 mg  10 mg Oral QHS Shah, Pratik D, DO      . [START ON 06/07/2020] ferrous sulfate tablet 325 mg  325 mg Oral Q breakfast Shah, Pratik D, DO      . folic acid (FOLVITE) tablet 1 mg  1 mg Oral Daily Sherryll Burger, Pratik D, DO      . [START ON 06/07/2020] hydrALAZINE (APRESOLINE) tablet 25 mg  25 mg Oral Q8H Shah, Pratik D, DO      . labetalol (NORMODYNE) injection 10 mg  10 mg Intravenous Q2H PRN Sherryll Burger, Pratik D, DO      . [START ON 06/07/2020] memantine (NAMENDA) tablet 10 mg  10 mg Oral BID Sherryll Burger, Pratik D, DO      . ondansetron (ZOFRAN) tablet 4 mg  4 mg Oral Q6H PRN Sherryll Burger, Pratik D, DO       Or  . ondansetron (ZOFRAN) injection 4 mg  4 mg Intravenous Q6H PRN Sherryll Burger, Pratik D, DO      . [START ON 06/07/2020] ramipril (ALTACE) capsule 5 mg  5 mg Oral Daily Sherryll Burger, Pratik D, DO      . [START ON 06/07/2020] sertraline (ZOLOFT) tablet 25 mg  25 mg Oral Daily Shah, Pratik D, DO      . sodium chloride flush (NS) 0.9 % injection 3 mL  3 mL Intravenous Q12H Shah, Pratik D, DO      . sodium chloride flush (NS) 0.9 % injection 3 mL  3 mL Intravenous PRN Maurilio Lovely D, DO         Discharge Medications: Please see discharge summary for a list of discharge medications.  Relevant Imaging Results:  Relevant Lab Results:   Additional Information PT SSN:  [id number]  Barry Brunner, LCSW

## 2020-06-06 NOTE — TOC Progression Note (Signed)
Transition of Care [facility]) - Progression Note    Patient Details  Name: Pamela [name] MRN: [id number] Date of Birth: May 03, 1921  Transition of Care [facility]) CM/SW Contact  Barry Brunner, LCSW Phone Number: 06/06/2020, 5:02 PM  Clinical Narrative:    CSW completed FL2, and faxed referrals. TOC to follow.        Expected Discharge Plan and Services                                                 Social Determinants of Health (SDOH) Interventions    Readmission Risk Interventions No flowsheet data found.

## 2020-06-06 NOTE — Evaluation (Signed)
Physical Therapy Evaluation Patient Details Name: Pamela [name] MRN: [id number] DOB: [birth year] Today's Date: 06/06/2020   History of Present Illness  HPI: Pamela [name] is a 86 y.o. female with medical history significant for tachybradycardia syndrome status post pacemaker, previous left hip replacement, hypertension, hyperlipidemia and coronary artery disease who presented to ED via EMS. Patient states that she does not remember why she came to the ED, History was provided by ED physician, ED medical record and daughter in law at bedside.  Per report, patient was noted to present with right-sided weakness with leaning to the right side and with transitory right facial drooping and slurred speech at the ALF. Last known well was 11 AM.  EMS was activated and patient was taken to the ED for further evaluation and management.  Patient denies blurry vision, headache, chest pain, shortness of breath, numbness or tingling.    Clinical Impression  Patient presents supine in bed with hand mit restraints, and bed alarm active. Patient is awake and alert but appears confused and disoriented to place. Patient self reports subjective history, but per level of confusion, may not be accurate. Patient shows good trunk initiation for bed mobility but ultimately requires Mod A for supine to sit. Also requires verbal cues for hand and LE placement. Patient requires HHA x 2 for sitting balance seated at EOB. Patient able to perform sit to stand with Mod A, but with poor upright posturing and weight shift toward LT, likely due to RLE weakness. Patient unable to maintain standing independently and stands with RLE in hip flexed, knee extended position. Patient with noted fatigue, but no other complaint of distress. Vitals remain at baseline. Patient required Mod A to return to supine in bed with verbal cues for use of bed rail. Patient repositioned center of bed, hand restraints reapplied, bed alarm set, call bell within  reach. Patient will benefit from continued physical therapy in hospital and recommended venue below to increase strength, balance, endurance for safe ADLs and gait.      Follow Up Recommendations SNF;Supervision/Assistance - 24 hour;Supervision for mobility/OOB (24 hour supervision/ assist at ALF)    Equipment Recommendations    Has all needed equipment at ALF    Recommendations for Other Services       Precautions / Restrictions Precautions Precautions: Fall Restrictions Weight Bearing Restrictions: No      Mobility  Bed Mobility Overal bed mobility: Needs Assistance Bed Mobility: Supine to Sit     Supine to sit: Mod assist     General bed mobility comments: Requires Mod A and cues for hand and leg placement    Transfers Overall transfer level: Needs assistance   Transfers: Sit to/from Stand Sit to Stand: Mod assist         General transfer comment: Mod A for sit to stand form bedside, ongoing RLE weakness, weightshift to LT, unabale to maintain standing independently  Ambulation/Gait             General Gait Details: Currently unable per LE weakness  Stairs            Wheelchair Mobility    Modified Rankin (Stroke Patients Only)       Balance Overall balance assessment: Needs assistance Sitting-balance support: Bilateral upper extremity supported;Feet supported Sitting balance-Leahy Scale: Poor Sitting balance - Comments: Requires BUE assist seated at EOB for seated balance   Standing balance support: Bilateral upper extremity supported Standing balance-Leahy Scale: Poor Standing balance comment: Requires Mod A to  maintain standing, weight shifts to LT                             Pertinent Vitals/Pain Pain Assessment: No/denies pain    Home Living Family/patient expects to be discharged to:: Assisted living               Home Equipment: Walker - 2 wheels;Wheelchair - manual Additional Comments: [name] resides at  ALF, states she requires assist with most ADLs, says she walks with walker sometimes, but mostly uses wheelchair, has all neccesary equipment at ALF    Prior Function Level of Independence: Needs assistance   Gait / Transfers Assistance Needed: says she walks with walker with assist sometimes, but mostly uses wheelchair  ADL's / Homemaking Assistance Needed: assisted by ALF staff  Comments: Patient demos some level of confusion, reported history may not be accurate     Hand Dominance        Extremity/Trunk Assessment   Upper Extremity Assessment Upper Extremity Assessment: Generalized weakness    Lower Extremity Assessment Lower Extremity Assessment: Generalized weakness       Communication   Communication: No difficulties  Cognition Arousal/Alertness: Awake/alert Behavior During Therapy: WFL for tasks assessed/performed Overall Cognitive Status: Impaired/Different from baseline Area of Impairment: Orientation                 Orientation Level: Disoriented to;Place             General Comments: Patient confused about place, unsure of AFL location and name, says she came for an appointment today from [facility] Comments      Exercises     Assessment/Plan    PT Assessment Patient needs continued PT services  PT Problem List Decreased strength;Decreased activity tolerance;Decreased balance;Decreased mobility       PT Treatment Interventions Functional mobility training;Patient/family education;Therapeutic activities;Gait training;Therapeutic exercise;Wheelchair mobility training;Balance training;Neuromuscular re-education    PT Goals (Current goals can be found in the Care Plan section)  Acute Rehab PT Goals Patient Stated Goal: Return to ALF PT Goal Formulation: With patient Time For Goal Achievement: 06/20/20 Potential to Achieve Goals: Good    Frequency Min 6X/week   Barriers to discharge        Co-evaluation                AM-PAC PT "6 Clicks" Mobility  Outcome Measure Help needed turning from your back to your side while in a flat bed without using bedrails?: A Lot Help needed moving from lying on your back to sitting on the side of a flat bed without using bedrails?: A Lot Help needed moving to and from a bed to a chair (including a wheelchair)?: A Lot Help needed standing up from a chair using your arms (e.g., wheelchair or bedside chair)?: A Lot Help needed to walk in hospital room?: Total Help needed climbing 3-5 steps with a railing? : Total 6 Click Score: 10    End of Session   Activity Tolerance: Patient limited by fatigue Patient left: in bed;with call bell/phone within reach;with bed alarm set;with restraints reapplied Nurse Communication: Mobility status PT Visit Diagnosis: Muscle weakness (generalized) (M62.81);Other abnormalities of gait and mobility (R26.89)    Time: 1135-1200 PT Time Calculation (min) (ACUTE ONLY): 25 min   Charges:   PT Evaluation $PT Eval Low Complexity: 1 Low PT Treatments $Therapeutic Activity: 8-22 mins    12:38 PM, 06/06/20 Sheria Lang  Mozella Rexrode PT DPT  Physical Therapist with [facility]  [facility]  [phone]

## 2020-06-06 NOTE — Progress Notes (Signed)
Since arrival to this room, pt has attempted to get OOB unassisted x2. Throws bed covers in floor, removed purewick x2, incontinently urinated in bed x1. Pt oriented to person and place but not situation. States, "I'm at the hospital in [place]" then begins to talk about her clothes in her bedroom and asking me to get something out of her kitchen at home, gesturing towards computer in room. Pt reoriented with each contact but without success. Pt currently in bed, drinking soda. Fidgeting with phone cord and call bell. Denies any c/o pain.

## 2020-06-06 NOTE — TOC Transition Note (Signed)
Transition of Care [facility]) - CM/SW Discharge Note   Patient Details  Name: Pamela [name] MRN: [id number] Date of Birth: [birth year]  Transition of Care [facility]) CM/SW Contact:  Barry Brunner, LCSW Phone Number: 06/06/2020, 11:15 AM   Clinical Narrative:    French Ana with [facility] agreeable to take patient back on 11/07. CSW confirmed with Maralyn Sago from [place] with [facility] that they would be able to provide HHPT for the patient. TOC faxed discharge summary to provided number [phone] for [facility] residence and faxed H&P, Progress note and face sheet to Maralyn Sago for HHPT services to provided number [phone]. Nurse agreeable to call EMS for patient. TOC signing off.          Patient Goals and CMS Choice        Discharge Placement                       Discharge Plan and Services                                     Social Determinants of Health (SDOH) Interventions     Readmission Risk Interventions No flowsheet data found.

## 2020-06-06 NOTE — Progress Notes (Signed)
1915 call time 1911 beeper time 1939 exam started 1940 exam finished 1940 images sent to soc 1941  Exam completed in epic 36 [facility] called.

## 2020-06-06 NOTE — Plan of Care (Signed)
  Problem: Acute Rehab PT Goals(only PT should resolve) Goal: Pt Will Go Supine/Side To Sit Flowsheets (Taken 06/06/2020 1239) Pt will go Supine/Side to Sit: with min guard assist Goal: Patient Will Transfer Sit To/From Stand Flowsheets (Taken 06/06/2020 1239) Patient will transfer sit to/from stand: with minimal assist Goal: Pt Will Transfer Bed To Chair/Chair To Bed Flowsheets (Taken 06/06/2020 1239) Pt will Transfer Bed to Chair/Chair to Bed: with min assist Goal: Pt Will Ambulate Flowsheets (Taken 06/06/2020 1239) Pt will Ambulate:  10 feet  with min guard assist  with rolling walker   12:40 PM, 06/06/20 Georges Lynch PT DPT  Physical Therapist with [facility]  [phone]

## 2020-06-06 NOTE — Discharge Summary (Signed)
Physician Discharge Summary  Pamela [name]:[id number] DOB: March 27, 1921 DOA: 06/04/2020  PCP: Pcp, No  Admit date: 06/04/2020  Discharge date: 06/06/2020  Admitted From:ALF  Disposition:  ALF  Recommendations for Outpatient Follow-up:  1. Follow up with PCP in 1-2 weeks 2. Continue on home blood pressure medications with Coreg dose increased to 6.25 mg twice daily for better blood pressure control 3. Consider outpatient palliative care follow-up for goals of care discussion 4. Continue other home medications as prior  Home Health: Yes with PT  Equipment/Devices: None  Discharge Condition: Stable  CODE STATUS: Full  Diet recommendation: Heart Healthy  Brief/Interim Summary: Per HPI: Pamela Kirkes Garrisonis a 86 y.o.femalewith medical history significant fortachybradycardia syndrome status post pacemaker, previous left hip replacement, hypertension, hyperlipidemiaandcoronary artery disease who presented to ED via EMS. Patient states that she does not remember why she came to the ED, History was providedby ED physician, ED medical record and daughter in lawat bedside. Per report, patient was noted to present with right-sided weakness with leaning to the right side and with transitory right facial drooping and slurred speech at the ALF. Last known well was 11 AM.EMS was activated and patient was taken to the ED for further evaluation and management. Patient denies blurry vision, headache, chest pain, shortness of breath, numbness or tingling.  ED Course: In the emergency department, blood pressure was 196/101 on arrival, otherwise patient was hemodynamically stable. Work-up in the ED showed leukopenia, thrombocytopenia, hyperglycemia, BUN 25, creatinine 0.89. Alcohol level was <10.CT of head without contrast showed a small focus of acute hemorrhage of the left thalamus, moderate atrophy and moderate chronic microvascular ischemic change noted. Neurologist on-call at [facility]  was consulted and recommended giving clevidipine and to maintain patient's SBP <140 with recommendation to repeat CT of head in the morning and that there was no indication for patient to be transferred to [facility] at this time per ED physician. IV hydralazine 5 mg x 2 was also given. Hospitalist was asked to admit patient for further evaluation and management.  -Patient was admitted with small left thalamic capsular intracranial hemorrhage that was likely related to hypertension.  This appears to have been associated with small deficits which appear to have resolved.  Unfortunately, patient has limited independence and mobility at her facility.  Patient has been weaned off clevidipine with stable blood pressure control and increased Coreg.  2D echocardiogram with LVEF 60-65% and no other acute findings noted.  She is noted to have minimal to moderate amount of bilateral atherosclerotic plaque.  Case was discussed with neurology Dr. Wilford Corner on 11/6 with plans to remain off any blood thinning agents for the time being and maintain stable blood pressure control.  No other acute interventions planned at this time given advanced age and poor functional status.  Plan is to return back to ALF with home health PT/OT which will be arranged.  Discharge Diagnoses:  Active Problems:   SSS (sick sinus syndrome) with PAT and marked bradycardia with 3 sec pauses    HTN (hypertension)   Hyperlipidemia   Hypertensive emergency   Pancytopenia (HCC)   Acute CVA (cerebrovascular accident) (HCC)   Hyperglycemia   Dehydration   CVA (cerebral vascular accident) [facility])  Principal discharge diagnosis: Small left thalamic capsular intracranial hemorrhage likely hypertensive in etiology.  Discharge Instructions  Discharge Instructions    Diet - low sodium heart healthy   Complete by: As directed    Discharge wound care:   Complete by: As directed  Daily dressing changes.   Increase activity slowly   Complete by: As  directed      Allergies as of 06/06/2020      Reactions   Gabapentin Other (See Comments)   Blurred vision, dizziness      Medication List    STOP taking these medications   aspirin EC 81 MG tablet     TAKE these medications   acetaminophen 500 MG tablet Commonly known as: TYLENOL Take 500 mg by mouth every 6 (six) hours as needed for mild pain or moderate pain.   calcium-vitamin D 500-200 MG-UNIT tablet Commonly known as: OSCAL WITH D Take 1 tablet by mouth daily.   carvedilol 6.25 MG tablet Commonly known as: COREG Take 1 tablet (6.25 mg total) by mouth 2 (two) times daily. What changed:   medication strength  how much to take   docusate sodium 100 MG capsule Commonly known as: Colace Take 1 capsule (100 mg total) by mouth 2 (two) times daily.   donepezil 10 MG tablet Commonly known as: ARICEPT TAKE ONE TABLET BY MOUTH EVERY NIGHT AT BEDTIME   FeroSul 325 (65 FE) MG tablet Generic drug: ferrous sulfate Take 325 mg by mouth daily.   folic acid 1 MG tablet Commonly known as: FOLVITE Take 1 mg by mouth daily.   hydrALAZINE 25 MG tablet Commonly known as: APRESOLINE TAKE ONE TABLET BY MOUTH IN THE MORNING,TWO TABLETS AT NOON, AND ONE TABLET IN THE EVENING   lactose free nutrition Liqd Take 1 Container by mouth at bedtime.   memantine 10 MG tablet Commonly known as: NAMENDA TAKE ONE TABLET (10MG  TOTAL) BY MOUTH TWO TIMES DAILY What changed: See the new instructions.   mirtazapine 15 MG tablet Commonly known as: REMERON Take 15 mg by mouth at bedtime as needed. For insomnia and/or appetite   mirtazapine 7.5 MG tablet Commonly known as: REMERON Take 7.5 mg by mouth at bedtime. (for abnormal weight loss)   OCUVITE PO Take 1 tablet by mouth 2 (two) times daily.   ramipril 5 MG capsule Commonly known as: ALTACE TAKE ONE CAPSULE (5MG  TOTAL) BY MOUTH DAILY   sertraline 25 MG tablet Commonly known as: ZOLOFT Take 25 mg by mouth daily.   VITAMIN D-3  PO Take 2,000 Units by mouth daily.            Discharge Care Instructions  (From admission, onward)         Start     Ordered   06/06/20 0000  Discharge wound care:       Comments: Daily dressing changes.   06/06/20 1147          Follow-up Information    Marinus Maw, MD Follow up in 1 week(s).   Specialty: Cardiology Contact information: [address] [place] [address] [phone]              Allergies  Allergen Reactions   Gabapentin Other (See Comments)    Blurred vision, dizziness     Consultations:  Discussed with neurology   Procedures/Studies: CT HEAD WO CONTRAST  Result Date: 06/05/2020 CLINICAL DATA:  86 year old female with history of hemorrhagic stroke in the left thalamus. Follow-up study. EXAM: CT HEAD WITHOUT CONTRAST TECHNIQUE: Contiguous axial images were obtained from the base of the skull through the vertex without intravenous contrast. COMPARISON:  Head CT 06/04/2020. FINDINGS: Brain: Mild cerebral atrophy. Patchy and confluent areas of decreased attenuation are noted throughout the deep and periventricular white matter of  the cerebral hemispheres bilaterally, compatible with chronic microvascular ischemic disease. Previously noted small focus of high attenuation in the left thalamus is similar to the prior study measuring 9 x 6 mm (axial image 17 of series 2), compatible with a small hemorrhagic stroke. This exerts no significant surrounding mass effect at this time. No evidence of new acute infarction, hydrocephalus, extra-axial collection or mass lesion/mass effect. Vascular: No hyperdense vessel or unexpected calcification. Skull: Normal. Negative for fracture or focal lesion. Sinuses/Orbits: No acute finding. Other: None. IMPRESSION: 1. Small hemorrhagic infarct in the left thalamus, similar to the prior study. 2. Mild cerebral atrophy with extensive chronic microvascular ischemic changes in the cerebral white matter, as above.  Electronically Signed   By: Trudie Reed M.D.   On: 06/05/2020 10:25   US Carotid Bilateral  Result Date: 06/05/2020 CLINICAL DATA:  Acute ischemic stroke. EXAM: BILATERAL CAROTID DUPLEX ULTRASOUND TECHNIQUE: Wallace Cullens scale imaging, color Doppler and duplex ultrasound were performed of bilateral carotid and vertebral arteries in the neck. COMPARISON:  None. FINDINGS: Criteria: Quantification of carotid stenosis is based on velocity parameters that correlate the residual internal carotid diameter with NASCET-based stenosis levels, using the diameter of the distal internal carotid lumen as the denominator for stenosis measurement. The following velocity measurements were obtained: RIGHT ICA: 37/12 cm/sec CCA: 69/7 cm/sec SYSTOLIC ICA/CCA RATIO:  0.5 ECA: 80 cm/sec LEFT ICA: 39/10 cm/sec CCA: 44/7 cm/sec SYSTOLIC ICA/CCA RATIO:  0.9 ECA: 65 cm/sec RIGHT CAROTID ARTERY: There is a moderate amount of eccentric echogenic plaque within the right carotid bulb (image 14 and 16), extending to involve the origin and proximal aspects of the right internal carotid artery (image 24), not resulting in elevated peak systolic velocities within the interrogated course of the right internal carotid artery to suggest a hemodynamically significant stenosis. RIGHT VERTEBRAL ARTERY:  Antegrade flow LEFT CAROTID ARTERY: There is a minimal amount of eccentric mixed echogenic plaque within the left carotid bulb (image 50), not resulting in elevated peak systolic velocities within the interrogated course of the left internal carotid artery to suggest a hemodynamically significant stenosis. LEFT VERTEBRAL ARTERY:  Antegrade flow IMPRESSION: Minimal to moderate amount of bilateral atherosclerotic plaque, right greater than left, not resulting in a hemodynamically significant stenosis within either internal carotid artery. Electronically Signed   By: Simonne Come M.D.   On: 06/05/2020 10:03   ECHOCARDIOGRAM COMPLETE  Result Date:  06/05/2020    ECHOCARDIOGRAM REPORT   Patient Name:   KATHLEENA FREEMAN Date of Exam: 06/05/2020 Medical Rec #:  [id number]        Height:       65.0 in Accession #:    [id number]       Weight:       122.8 lb Date of Birth:  22-Jun-1921        BSA:          1.608 m Patient Age:    86 years         BP:           126/76 mmHg Patient Gender: F                HR:           92 bpm. [facility] Procedure: 2D Echo Indications:    434.91 stroke  History:        Patient has prior history of Echocardiogram examinations, most  recent 11/13/2019. CAD, Pacemaker and Defibrillator; Risk                 Factors:Hypertension and Dyslipidemia. SSS. covid +.  Sonographer:    Celene Skeen RDCS (AE) Referring Phys: [id number] OLADAPO ADEFESO  Sonographer Comments: Restricted mobility. IMPRESSIONS  1. Left ventricular ejection fraction, by estimation, is 60 to 65%. The left ventricle has normal function. The left ventricle has no regional wall motion abnormalities. There is moderate left ventricular hypertrophy. Left ventricular diastolic parameters are indeterminate.  2. Right ventricular systolic function is normal. The right ventricular size is normal. A device wire is visualized. Tricuspid regurgitation signal is inadequate for assessing PA pressure.  3. Left atrial size was mildly dilated.  4. The mitral valve is grossly normal. Trivial mitral valve regurgitation.  5. The aortic valve is tricuspid. Aortic valve regurgitation is trivial. Mild to moderate aortic valve sclerosis/calcification is present, without any evidence of aortic stenosis.  6. The inferior vena cava is normal in size with <50% respiratory variability, suggesting right atrial pressure of 8 mmHg. FINDINGS  Left Ventricle: Left ventricular ejection fraction, by estimation, is 60 to 65%. The left ventricle has normal function. The left ventricle has no regional wall motion abnormalities. The left ventricular internal cavity size was normal in  size. There is  moderate left ventricular hypertrophy. Left ventricular diastolic parameters are indeterminate. Right Ventricle: The right ventricular size is normal. No increase in right ventricular wall thickness. Right ventricular systolic function is normal. Tricuspid regurgitation signal is inadequate for assessing PA pressure. Left Atrium: Left atrial size was mildly dilated. Right Atrium: Right atrial size was normal in size. Pericardium: There is no evidence of pericardial effusion. Mitral Valve: The mitral valve is grossly normal. Trivial mitral valve regurgitation. Tricuspid Valve: The tricuspid valve is grossly normal. Tricuspid valve regurgitation is trivial. Aortic Valve: The aortic valve is tricuspid. Aortic valve regurgitation is trivial. Mild to moderate aortic valve sclerosis/calcification is present, without any evidence of aortic stenosis. Pulmonic Valve: The pulmonic valve was not well visualized. Pulmonic valve regurgitation is trivial. Aorta: The aortic root is normal in size and structure. Venous: The inferior vena cava is normal in size with less than 50% respiratory variability, suggesting right atrial pressure of 8 mmHg. IAS/Shunts: No atrial level shunt detected by color flow Doppler. Additional Comments: A device wire is visualized.  LEFT VENTRICLE PLAX 2D LVIDd:         4.11 cm LVIDs:         3.18 cm LV PW:         1.27 cm LV IVS:        1.35 cm LVOT diam:     2.20 cm LV SV:         90 LV SV Index:   56 LVOT Area:     3.80 cm  RIGHT VENTRICLE RV S prime:     13.30 cm/s TAPSE (M-mode): 1.6 cm LEFT ATRIUM             Index       RIGHT ATRIUM           Index LA diam:        3.60 cm 2.24 cm/m  RA Area:     15.70 cm LA Vol (A2C):   62.3 ml 38.75 ml/m RA Volume:   36.40 ml  22.64 ml/m LA Vol (A4C):   56.0 ml 34.83 ml/m LA Biplane Vol: 61.7 ml 38.38 ml/m  AORTIC VALVE LVOT Vmax:  103.00 cm/s LVOT Vmean:  75.100 cm/s LVOT VTI:    0.237 m  AORTA Ao Root diam: 3.40 cm  SHUNTS Systemic VTI:   0.24 m Systemic Diam: 2.20 cm Nona DellSamuel Mcdowell MD Electronically signed by Nona DellSamuel Mcdowell MD Signature Date/Time: 06/05/2020/12:15:35 PM    Final    CT HEAD CODE STROKE WO CONTRAST  Result Date: 06/04/2020 CLINICAL DATA:  Code stroke. Acute neuro deficit. Right-sided weakness, slurred speech. EXAM: CT HEAD WITHOUT CONTRAST TECHNIQUE: Contiguous axial images were obtained from the base of the skull through the vertex without intravenous contrast. COMPARISON:  CT head 11/20/2019 FINDINGS: Brain: Small area of acute hemorrhage in the left thalamus. Blood volume less than 1 cc. No other areas of hemorrhage Moderate atrophy. Moderate chronic microvascular ischemic changes unchanged from the prior study. Negative for mass or midline shift. Vascular: Atherosclerotic calcification in the carotid and vertebral arteries bilaterally. Negative for hyperdense vessel Skull: Negative skull. Sinuses/Orbits: Paranasal sinuses clear. Bilateral cataract extraction Other: None ASPECTS ([place] Stroke Program Early CT Score) Not applicable due to acute hemorrhage. IMPRESSION: 1. Small focus of acute hemorrhage left thalamus. 2. Moderate atrophy and moderate chronic microvascular ischemic change. 3. These results were called by telephone at the time of interpretation on 06/04/2020 at 7:47 pm to provider [facility] Z[facility]MIT , who verbally acknowledged these results. Electronically Signed   By: Marlan Palauharles  Clark M.D.   On: 06/04/2020 19:49     Discharge Exam: Vitals:   06/06/20 1100 06/06/20 1119  BP: 109/77   Pulse: (!) 107 (!) 110  Resp: (!) 22 20  Temp:  (!) 97.2 F (36.2 C)  SpO2: 95% 99%   Vitals:   06/06/20 0800 06/06/20 1000 06/06/20 1100 06/06/20 1119  BP: (!) 131/102 104/85 109/77   Pulse: 99 (!) 104 (!) 107 (!) 110  Resp: (!) 24 20 (!) 22 20  Temp: 98 F (36.7 C)   (!) 97.2 F (36.2 C)  TempSrc: Oral   Oral  SpO2: 93% 95% 95% 99%  Weight:      Height:        General: Pt is alert, awake, not in acute  distress Cardiovascular: RRR, S1/S2 +, no rubs, no gallops Respiratory: CTA bilaterally, no wheezing, no rhonchi Abdominal: Soft, NT, ND, bowel sounds + Extremities: no edema, no cyanosis    The results of significant diagnostics from this hospitalization (including imaging, microbiology, ancillary and laboratory) are listed below for reference.     Microbiology: Recent Results (from the past 240 hour(s))  Respiratory Panel by RT PCR (Flu A&B, Covid) - Nasopharyngeal Swab     Status: Abnormal   Collection Time: 06/05/20 12:20 AM   Specimen: Nasopharyngeal Swab  Result Value Ref Range Status   SARS Coronavirus 2 by RT PCR POSITIVE (A) NEGATIVE Final    Comment: RESULT CALLED TO, READ BACK BY AND VERIFIED WITH: MCKINNEY,A @ 0248 ON 06/05/20 BY JUW (NOTE) SARS-CoV-2 target nucleic acids are DETECTED.  SARS-CoV-2 RNA is generally detectable in upper respiratory specimens  during the acute phase of infection. Positive results are indicative of the presence of the identified virus, but do not rule out bacterial infection or co-infection with other pathogens not detected by the test. Clinical correlation with patient history and other diagnostic information is necessary to determine patient infection status. The expected result is Negative.  Fact Sheet for Patients:  https://www.moore.com/https://www.fda.gov/media/142436/download  Fact Sheet for Healthcare Providers: https://www.young.biz/https://www.fda.gov/media/142435/download  This test is not yet approved or cleared by the [place] FDA and  has been authorized for detection and/or diagnosis of SARS-CoV-2 by FDA under an Emergency Use Authorization (EUA).  This EUA will remain in effect (meaning this test can be  used) for the duration of  the COVID-19 declaration under Section 564(b)(1) of the Act, 21 U.S.C. section 360bbb-3(b)(1), unless the authorization is terminated or revoked sooner.      Influenza A by PCR NEGATIVE NEGATIVE Final   Influenza B by PCR  NEGATIVE NEGATIVE Final    Comment: (NOTE) The Xpert Xpress SARS-CoV-2/FLU/RSV assay is intended as an aid in  the diagnosis of influenza from Nasopharyngeal swab specimens and  should not be used as a sole basis for treatment. Nasal washings and  aspirates are unacceptable for Xpert Xpress SARS-CoV-2/FLU/RSV  testing.  Fact Sheet for Patients: https://www.moore.com/  Fact Sheet for Healthcare Providers: https://www.young.biz/  This test is not yet approved or cleared by the [place] FDA and  has been authorized for detection and/or diagnosis of SARS-CoV-2 by  FDA under an Emergency Use Authorization (EUA). This EUA will remain  in effect (meaning this test can be used) for the duration of the  Covid-19 declaration under Section 564(b)(1) of the Act, 21  U.S.C. section 360bbb-3(b)(1), unless the authorization is  terminated or revoked. Performed at [facility], [address], [place], [place] [address]   MRSA PCR Screening     Status: None   Collection Time: 06/05/20  3:08 PM   Specimen: Nasal Mucosa; Nasopharyngeal  Result Value Ref Range Status   MRSA by PCR NEGATIVE NEGATIVE Final    Comment:        The GeneXpert MRSA Assay (FDA approved for NASAL specimens only), is one component of a comprehensive MRSA colonization surveillance program. It is not intended to diagnose MRSA infection nor to guide or monitor treatment for MRSA infections. Performed at [facility], [address] [place], [place] [address]      Labs: BNP (last 3 results) Recent Labs    11/12/19 1300  BNP 195.0*   Basic Metabolic Panel: Recent Labs  Lab 06/04/20 1915 06/05/20 0459  NA 142 143  K 3.6 3.5  CL 107 107  CO2 29 26  GLUCOSE 143* 118*  BUN 25* 23  CREATININE 0.89 0.77  CALCIUM 9.7 9.6  MG  --  2.0  PHOS  --  2.9   Liver Function Tests: Recent Labs  Lab 06/04/20 1915 06/05/20 0459  AST 17 16  ALT 11 10  ALKPHOS 77 74   BILITOT 0.5 0.8  PROT 6.5 6.1*  ALBUMIN 4.2 4.0   No results for input(s): LIPASE, AMYLASE in the last 168 hours. No results for input(s): AMMONIA in the last 168 hours. CBC: Recent Labs  Lab 06/04/20 1915 06/05/20 0459  WBC 3.9* 4.0  NEUTROABS 1.9  --   HGB 11.8* 11.9*  HCT 38.9 39.1  MCV 98.2 97.0  PLT 133* 132*   Cardiac Enzymes: No results for input(s): CKTOTAL, CKMB, CKMBINDEX, TROPONINI in the last 168 hours. BNP: Invalid input(s): POCBNP CBG: No results for input(s): GLUCAP in the last 168 hours. D-Dimer No results for input(s): DDIMER in the last 72 hours. Hgb A1c Recent Labs    06/05/20 0459  HGBA1C 5.2   Lipid Profile Recent Labs    06/05/20 0459  CHOL 260*  HDL 92  LDLCALC 160*  TRIG 39  CHOLHDL 2.8   Thyroid function studies Recent Labs    06/05/20 0459  TSH 1.737   Anemia work up No  results for input(s): VITAMINB12, FOLATE, FERRITIN, TIBC, IRON, RETICCTPCT in the last 72 hours. Urinalysis    Component Value Date/Time   COLORURINE YELLOW 04/25/2020 1500   APPEARANCEUR CLEAR 04/25/2020 1500   LABSPEC 1.014 04/25/2020 1500   PHURINE 6.0 04/25/2020 1500   GLUCOSEU NEGATIVE 04/25/2020 1500   HGBUR NEGATIVE 04/25/2020 1500   BILIRUBINUR NEGATIVE 04/25/2020 1500   KETONESUR NEGATIVE 04/25/2020 1500   PROTEINUR NEGATIVE 04/25/2020 1500   UROBILINOGEN 0.2 02/10/2015 1104   NITRITE NEGATIVE 04/25/2020 1500   LEUKOCYTESUR NEGATIVE 04/25/2020 1500   Sepsis Labs Invalid input(s): PROCALCITONIN,  WBC,  LACTICIDVEN Microbiology Recent Results (from the past 240 hour(s))  Respiratory Panel by RT PCR (Flu A&B, Covid) - Nasopharyngeal Swab     Status: Abnormal   Collection Time: 06/05/20 12:20 AM   Specimen: Nasopharyngeal Swab  Result Value Ref Range Status   SARS Coronavirus 2 by RT PCR POSITIVE (A) NEGATIVE Final    Comment: RESULT CALLED TO, READ BACK BY AND VERIFIED WITH: MCKINNEY,A @ 0248 ON 06/05/20 BY JUW (NOTE) SARS-CoV-2 target  nucleic acids are DETECTED.  SARS-CoV-2 RNA is generally detectable in upper respiratory specimens  during the acute phase of infection. Positive results are indicative of the presence of the identified virus, but do not rule out bacterial infection or co-infection with other pathogens not detected by the test. Clinical correlation with patient history and other diagnostic information is necessary to determine patient infection status. The expected result is Negative.  Fact Sheet for Patients:  https://www.moore.com/  Fact Sheet for Healthcare Providers: https://www.young.biz/  This test is not yet approved or cleared by the [place] FDA and  has been authorized for detection and/or diagnosis of SARS-CoV-2 by FDA under an Emergency Use Authorization (EUA).  This EUA will remain in effect (meaning this test can be  used) for the duration of  the COVID-19 declaration under Section 564(b)(1) of the Act, 21 U.S.C. section 360bbb-3(b)(1), unless the authorization is terminated or revoked sooner.      Influenza A by PCR NEGATIVE NEGATIVE Final   Influenza B by PCR NEGATIVE NEGATIVE Final    Comment: (NOTE) The Xpert Xpress SARS-CoV-2/FLU/RSV assay is intended as an aid in  the diagnosis of influenza from Nasopharyngeal swab specimens and  should not be used as a sole basis for treatment. Nasal washings and  aspirates are unacceptable for Xpert Xpress SARS-CoV-2/FLU/RSV  testing.  Fact Sheet for Patients: https://www.moore.com/  Fact Sheet for Healthcare Providers: https://www.young.biz/  This test is not yet approved or cleared by the [place] FDA and  has been authorized for detection and/or diagnosis of SARS-CoV-2 by  FDA under an Emergency Use Authorization (EUA). This EUA will remain  in effect (meaning this test can be used) for the duration of the  Covid-19 declaration under Section  564(b)(1) of the Act, 21  U.S.C. section 360bbb-3(b)(1), unless the authorization is  terminated or revoked. Performed at [facility], [address], [place], [place] [address]   MRSA PCR Screening     Status: None   Collection Time: 06/05/20  3:08 PM   Specimen: Nasal Mucosa; Nasopharyngeal  Result Value Ref Range Status   MRSA by PCR NEGATIVE NEGATIVE Final    Comment:        The GeneXpert MRSA Assay (FDA approved for NASAL specimens only), is one component of a comprehensive MRSA colonization surveillance program. It is not intended to diagnose MRSA infection nor to guide or monitor treatment for MRSA infections. Performed at  [facility], [address] [place], [place] [address]      Time coordinating discharge: 35 minutes  SIGNED:   Erick Blinks, DO [facility] 06/06/2020, 11:48 AM  If 7PM-7AM, please contact night-coverage [email or web address]

## 2020-06-06 NOTE — Progress Notes (Signed)
Pt arrived to room #338 via stretcher from ED as direct admit. PT just discharged from [facility] today to [facility]. Per report, pt returned to [facility] due to [facility] unable to accomodate covid (+) pt.  Pt alert, oriented to self, place and month. Denies c/o. VSS. No resp difficulty, lungs CTA, heart sounds wnl, RRR. Pt oriented to room and safety measures. Bed alarm on. MD Sherryll Burger notified that pt in room, requested orders.

## 2020-06-06 NOTE — Progress Notes (Signed)
Pt's son Rocky Link notified of pt's readmission and room number.

## 2020-06-06 NOTE — ED Provider Notes (Signed)
[facility] Provider Note   CSN: [id number] Arrival date & time: 06/04/20  1916     History No chief complaint on file.   Pamela [name] is a 86 y.o. female.  Patient from nursing home and around 11 AM she was seen normal but then developed right-sided weakness and mild slurred speech  The history is provided by the nursing home and medical records. No language interpreter was used.  Weakness Severity:  Mild Onset quality:  Sudden Timing:  Constant Progression:  Worsening Chronicity:  New Context: not alcohol use   Relieved by:  Nothing Worsened by:  Nothing Ineffective treatments:  None tried Associated symptoms: no abdominal pain, no chest pain, no cough, no diarrhea, no frequency, no headaches and no seizures        Past Medical History:  Diagnosis Date   Coronary artery disease    stent in the LAD artery in 2002   Gait disorder 03/05/2017   HTN (hypertension) 08/30/2012   Hyperlipidemia 08/30/2012   ICD (implantable cardiac defibrillator) in place    Macular degeneration    Pacemaker 08/29/2012   st jude accent DR RF device, model number [id number], serial number [id number], implanted 08/29/2012 for sinus bradycardia, runs of supraventricular tachycardia  last checked 10/23/2012   Polyneuropathy in other diseases classified elsewhere [facility])    Shingles 08/2016   SSS (sick sinus syndrome) with PAT and marked bradycardia with 3 sec pauses  08/30/2012   Status post placement of cardiac pacemaker. 08/29/12 St. Jude device 08/30/2012   Venous insufficiency     Patient Active Problem List   Diagnosis Date Noted   CVA (cerebral vascular accident) (HCC) 06/05/2020   Acute CVA (cerebrovascular accident) (HCC) 06/04/2020   Hyperglycemia 06/04/2020   Dehydration 06/04/2020   Pneumothorax, left 11/13/2019   Dementia without behavioral disturbance (HCC)    Palliative care by specialist    Advanced care planning/counseling discussion     Goals of care, counseling/discussion    Syncope 11/12/2019   Pancytopenia (HCC) 06/26/2017   Gait disorder 03/05/2017   Urinary frequency 08/10/2015   Postoperative anemia due to acute blood loss 07/27/2015   SVT (supraventricular tachycardia) (HCC) 07/26/2015   Femur fracture, left (HCC) 07/24/2015   Chronic kidney disease 07/24/2015   Periprosthetic fracture around internal prosthetic left hip joint (HCC) 07/24/2015   Fall at home    DVT (deep venous thrombosis) (HCC) 01/30/2014   Occult blood positive stool 01/30/2014   Anemia, iron deficiency 01/06/2014   Hip fracture, left (HCC) 01/03/2014   QT prolongation 01/03/2014   Atherosclerotic cardiovascular disease 08/13/2013   Hypertensive emergency 04/08/2013   Dizzy 04/08/2013   PAT (paroxysmal atrial tachycardia) (HCC) 12/25/2012   SSS (sick sinus syndrome) with PAT and marked bradycardia with 3 sec pauses  08/30/2012   Status post placement of cardiac pacemaker. 08/29/12 St. Jude device 08/30/2012   HTN (hypertension) 08/30/2012   Hyperlipidemia 08/30/2012   Osteoporosis, unspecified 04/19/2012   Essential and other specified forms of tremor 04/19/2012   Essential hypertension 04/19/2012   Cardiac dysrhythmia 04/19/2012   Chronic ischemic heart disease 04/19/2012   Hereditary and idiopathic peripheral neuropathy 04/19/2012   Memory loss 04/19/2012   Disturbance of skin sensation 04/19/2012   Pain in joint, shoulder region 11/20/2011   Status post complete repair of rotator cuff 11/20/2011   Muscle weakness (generalized) 11/20/2011    Past Surgical History:  Procedure Laterality Date   ABDOMINAL HYSTERECTOMY     basal cell  06/2017  R side of face.   CARDIAC CATHETERIZATION  2006   3 stents placed   CARDIAC CATHETERIZATION  06/2001   placement of BiodivYsio 2.5.10mm stent dilated to 2.54mm in proximal left anterior descenting stenotic lesion   CESAREAN SECTION     COLONOSCOPY W/  POLYPECTOMY     HIP ARTHROPLASTY Left 01/04/2014   Procedure: ARTHROPLASTY BIPOLAR HIP;  Surgeon: Shelda Pal, MD;  Location: [facility] OR;  Service: Orthopedics;  Laterality: Left;   INSERT / REPLACE / REMOVE PACEMAKER  08/29/2012   st jude    KIDNEY STONE SURGERY     OPEN REDUCTION INTERNAL FIXATION (ORIF) DISTAL RADIAL FRACTURE Right 04/01/2016   Procedure: OPEN REDUCTION INTERNAL FIXATION (ORIF) RIGHT DISTAL RADIAL FRACTURE AND REPAIR;  Surgeon: Bradly Bienenstock, MD;  Location: [facility] OR;  Service: Orthopedics;  Laterality: Right;   PERMANENT PACEMAKER INSERTION N/A 08/29/2012   Procedure: PERMANENT PACEMAKER INSERTION;  Surgeon: Thurmon Fair, MD;  Location: [facility] CATH LAB;  Service: Cardiovascular;  Laterality: N/A;   RADIOFREQUENCY ABLATION  08/25/2010   ablation of left greater saphenous vein   ROTATOR CUFF REPAIR Left    ROTATOR CUFF REPAIR Right    TOTAL HIP REVISION Left 07/24/2015   Procedure: ORIF LEFT PERIPROSTHETIC FEMUR FRACTURE; CONVERSION TO TOTAL HIP ARTHROPLASTY POSS. FEMORAL COMPONENT REVISION ;  Surgeon: Samson Frederic, MD;  Location: [facility] OR;  Service: Orthopedics;  Laterality: Left;     OB History   No obstetric history on file.     Family History  Problem Relation Age of Onset   Heart disease Mother    Congestive Heart Failure Mother    Heart disease Father    Heart attack Father    Heart disease Brother     Social History   Tobacco Use   Smoking status: Never Smoker   Smokeless tobacco: Never Used  Vaping Use   Vaping Use: Never used  Substance Use Topics   Alcohol use: No    Alcohol/week: 0.0 standard drinks   Drug use: No    Home Medications Prior to Admission medications   Medication Sig Start Date End Date Taking? Authorizing Provider  acetaminophen (TYLENOL) 500 MG tablet Take 500 mg by mouth every 6 (six) hours as needed for mild pain or moderate pain.    Yes [provider]  aspirin EC 81 MG tablet Take 81 mg by mouth every  morning.    Yes [provider]  carvedilol (COREG) 3.125 MG tablet Take 3.125 mg by mouth 2 (two) times daily. 06/03/20  Yes [provider]  docusate sodium (COLACE) 100 MG capsule Take 1 capsule (100 mg total) by mouth 2 (two) times daily. 04/01/16  Yes Bradly Bienenstock, MD  donepezil (ARICEPT) 10 MG tablet TAKE ONE TABLET BY MOUTH EVERY NIGHT AT BEDTIME 10/16/18  Yes York Spaniel, MD  FEROSUL 325 (65 Fe) MG tablet Take 325 mg by mouth daily. 03/01/20  Yes [provider]  lactose free nutrition (BOOST) LIQD Take 1 Container by mouth at bedtime. 05/18/20  Yes [provider]  memantine (NAMENDA) 10 MG tablet TAKE ONE TABLET (10MG  TOTAL) BY MOUTH TWO TIMES DAILY Patient taking differently: Take 10 mg by mouth 2 (two) times daily.  07/17/19  Yes 07/19/19, NP  mirtazapine (REMERON) 15 MG tablet Take 15 mg by mouth at bedtime as needed. For insomnia and/or appetite 06/03/20  Yes [provider]  mirtazapine (REMERON) 7.5 MG tablet Take 7.5 mg by mouth at bedtime. (for abnormal  weight loss) 06/03/20  Yes [provider]  calcium-vitamin D (OSCAL WITH D) 500-200 MG-UNIT tablet Take 1 tablet by mouth daily.    [provider]  Cholecalciferol (VITAMIN D-3 PO) Take 2,000 Units by mouth daily.     [provider]  folic acid (FOLVITE) 1 MG tablet Take 1 mg by mouth daily.    [provider]  hydrALAZINE (APRESOLINE) 25 MG tablet TAKE ONE TABLET BY MOUTH IN THE MORNING,TWO TABLETS AT NOON, AND ONE TABLET IN THE EVENING Patient not taking: Reported on 06/04/2020 07/29/19   Marinus Maw, MD  Multiple Vitamins-Minerals (OCUVITE PO) Take 1 tablet by mouth 2 (two) times daily.    [provider]  ramipril (ALTACE) 5 MG capsule TAKE ONE CAPSULE (  TOTAL) BY MOUTH DAILY Patient not taking: Reported on 06/04/2020 12/03/18   Marinus Maw, MD  sertraline (ZOLOFT) 25 MG tablet Take 25 mg by mouth daily. Patient not taking:  Reported on 06/04/2020    [provider]    Allergies    Gabapentin  Review of Systems   Review of Systems  Constitutional: Negative for appetite change and fatigue.  HENT: Negative for congestion, ear discharge and sinus pressure.   Eyes: Negative for discharge.  Respiratory: Negative for cough.   Cardiovascular: Negative for chest pain.  Gastrointestinal: Negative for abdominal pain and diarrhea.  Genitourinary: Negative for frequency and hematuria.  Musculoskeletal: Negative for back pain.  Skin: Negative for rash.  Neurological: Positive for weakness. Negative for seizures and headaches.  Psychiatric/Behavioral: Negative for hallucinations.    Physical Exam Updated Vital Signs BP (!) 131/102    Pulse 99    Temp 98.6 F (37 C) (Oral)    Resp (!) 24    Ht  (1.651 m)    Wt 56.6 kg    SpO2 93%    BMI 20.76 kg/m   Physical Exam Vitals and nursing note reviewed.  Constitutional:      Appearance: She is well-developed.  HENT:     Head: Normocephalic.  Eyes:     General: No scleral icterus.    Conjunctiva/sclera: Conjunctivae normal.  Neck:     Thyroid: No thyromegaly.  Cardiovascular:     Rate and Rhythm: Normal rate and regular rhythm.     Heart sounds: No murmur heard.  No friction rub. No gallop.   Pulmonary:     Breath sounds: No stridor. No wheezing or rales.  Chest:     Chest wall: No tenderness.  Abdominal:     General: There is no distension.     Tenderness: There is no abdominal tenderness. There is no rebound.  Musculoskeletal:        General: Normal range of motion.     Cervical back: Neck supple.  Lymphadenopathy:     Cervical: No cervical adenopathy.  Skin:    Findings: No erythema or rash.  Neurological:     Mental Status: She is alert and oriented to person, place, and time.     Motor: No abnormal muscle tone.     Coordination: Coordination normal.     Comments: Weakness right arm right leg with some minimal slurred speech   Psychiatric:        Behavior: Behavior normal.     ED Results / Procedures / Treatments   Labs (all labs ordered are listed, but only abnormal results are displayed) Labs Reviewed  RESPIRATORY PANEL BY RT PCR (FLU A&B, COVID) - Abnormal; Notable for the  following components:      Result Value   SARS Coronavirus 2 by RT PCR POSITIVE (*)    All other components within normal limits  CBC - Abnormal; Notable for the following components:   WBC 3.9 (*)    Hemoglobin 11.8 (*)    Platelets 133 (*)    All other components within normal limits  COMPREHENSIVE METABOLIC PANEL - Abnormal; Notable for the following components:   Glucose, Bld 143 (*)    BUN 25 (*)    GFR, Estimated 59 (*)    All other components within normal limits  COMPREHENSIVE METABOLIC PANEL - Abnormal; Notable for the following components:   Glucose, Bld 118 (*)    Total Protein 6.1 (*)    All other components within normal limits  CBC - Abnormal; Notable for the following components:   Hemoglobin 11.9 (*)    Platelets 132 (*)    All other components within normal limits  LIPID PANEL - Abnormal; Notable for the following components:   Cholesterol 260 (*)    LDL Cholesterol 160 (*)    All other components within normal limits  MRSA PCR SCREENING  ETHANOL  PROTIME-INR  APTT  DIFFERENTIAL  PROTIME-INR  APTT  MAGNESIUM  PHOSPHORUS  TSH  HEMOGLOBIN A1C  RAPID URINE DRUG SCREEN, HOSP PERFORMED  URINALYSIS, ROUTINE W REFLEX MICROSCOPIC  I-STAT CHEM 8, ED    EKG EKG Interpretation  Date/Time:  Friday June 04 2020 19:19:24 EDT Ventricular Rate:  87 PR Interval:    QRS Duration: 101 QT Interval:  388 QTC Calculation: 467 R Axis:   -72 Text Interpretation: Sinus rhythm Atrial premature complex Incomplete RBBB and LAFB Baseline wander in lead(s) II III aVF No significant change since last tracing Confirmed by Alvira MondaySchlossman, Erin ([id number]) on 06/05/2020 11:22:19 PM   Radiology CT HEAD WO CONTRAST  Result  Date: 06/05/2020 CLINICAL DATA:  86 year old female with history of hemorrhagic stroke in the left thalamus. Follow-up study. EXAM: CT HEAD WITHOUT CONTRAST TECHNIQUE: Contiguous axial images were obtained from the base of the skull through the vertex without intravenous contrast. COMPARISON:  Head CT 06/04/2020. FINDINGS: Brain: Mild cerebral atrophy. Patchy and confluent areas of decreased attenuation are noted throughout the deep and periventricular white matter of the cerebral hemispheres bilaterally, compatible with chronic microvascular ischemic disease. Previously noted small focus of high attenuation in the left thalamus is similar to the prior study measuring 9 x 6 mm (axial image 17 of series 2), compatible with a small hemorrhagic stroke. This exerts no significant surrounding mass effect at this time. No evidence of new acute infarction, hydrocephalus, extra-axial collection or mass lesion/mass effect. Vascular: No hyperdense vessel or unexpected calcification. Skull: Normal. Negative for fracture or focal lesion. Sinuses/Orbits: No acute finding. Other: None. IMPRESSION: 1. Small hemorrhagic infarct in the left thalamus, similar to the prior study. 2. Mild cerebral atrophy with extensive chronic microvascular ischemic changes in the cerebral white matter, as above. Electronically Signed   By: Trudie Reedaniel  Entrikin M.D.   On: 06/05/2020 10:25   US Carotid Bilateral  Result Date: 06/05/2020 CLINICAL DATA:  Acute ischemic stroke. EXAM: BILATERAL CAROTID DUPLEX ULTRASOUND TECHNIQUE: Wallace CullensGray scale imaging, color Doppler and duplex ultrasound were performed of bilateral carotid and vertebral arteries in the neck. COMPARISON:  None. FINDINGS: Criteria: Quantification of carotid stenosis is based on velocity parameters that correlate the residual internal carotid diameter with NASCET-based stenosis levels, using the diameter of the distal internal carotid lumen as the denominator for stenosis  measurement. The  following velocity measurements were obtained: RIGHT ICA: 37/12 cm/sec CCA: 69/7 cm/sec SYSTOLIC ICA/CCA RATIO:  0.5 ECA: 80 cm/sec LEFT ICA: 39/10 cm/sec CCA: 44/7 cm/sec SYSTOLIC ICA/CCA RATIO:  0.9 ECA: 65 cm/sec RIGHT CAROTID ARTERY: There is a moderate amount of eccentric echogenic plaque within the right carotid bulb (image 14 and 16), extending to involve the origin and proximal aspects of the right internal carotid artery (image 24), not resulting in elevated peak systolic velocities within the interrogated course of the right internal carotid artery to suggest a hemodynamically significant stenosis. RIGHT VERTEBRAL ARTERY:  Antegrade flow LEFT CAROTID ARTERY: There is a minimal amount of eccentric mixed echogenic plaque within the left carotid bulb (image 50), not resulting in elevated peak systolic velocities within the interrogated course of the left internal carotid artery to suggest a hemodynamically significant stenosis. LEFT VERTEBRAL ARTERY:  Antegrade flow IMPRESSION: Minimal to moderate amount of bilateral atherosclerotic plaque, right greater than left, not resulting in a hemodynamically significant stenosis within either internal carotid artery. Electronically Signed   By: Simonne Come M.D.   On: 06/05/2020 10:03   ECHOCARDIOGRAM COMPLETE  Result Date: 06/05/2020    ECHOCARDIOGRAM REPORT   Patient Name:   LILLIE PORTNER Date of Exam: 06/05/2020 Medical Rec #:  [id number]        Height:       65.0 in Accession #:    [id number]       Weight:       122.8 lb Date of Birth:  January 20, 1921        BSA:          1.608 m Patient Age:    89 years         BP:           126/76 mmHg Patient Gender: F                HR:           92 bpm. [facility] Procedure: 2D Echo Indications:    434.91 stroke  History:        Patient has prior history of Echocardiogram examinations, most                 recent 11/13/2019. CAD, Pacemaker and Defibrillator; Risk                 Factors:Hypertension and  Dyslipidemia. SSS. covid +.  Sonographer:    Celene Skeen RDCS (AE) Referring Phys: [id number] OLADAPO ADEFESO  Sonographer Comments: Restricted mobility. IMPRESSIONS  1. Left ventricular ejection fraction, by estimation, is 60 to 65%. The left ventricle has normal function. The left ventricle has no regional wall motion abnormalities. There is moderate left ventricular hypertrophy. Left ventricular diastolic parameters are indeterminate.  2. Right ventricular systolic function is normal. The right ventricular size is normal. A device wire is visualized. Tricuspid regurgitation signal is inadequate for assessing PA pressure.  3. Left atrial size was mildly dilated.  4. The mitral valve is grossly normal. Trivial mitral valve regurgitation.  5. The aortic valve is tricuspid. Aortic valve regurgitation is trivial. Mild to moderate aortic valve sclerosis/calcification is present, without any evidence of aortic stenosis.  6. The inferior vena cava is normal in size with <50% respiratory variability, suggesting right atrial pressure of 8 mmHg. FINDINGS  Left Ventricle: Left ventricular ejection fraction, by estimation, is 60 to 65%. The left ventricle has normal function. The left ventricle has no regional  wall motion abnormalities. The left ventricular internal cavity size was normal in size. There is  moderate left ventricular hypertrophy. Left ventricular diastolic parameters are indeterminate. Right Ventricle: The right ventricular size is normal. No increase in right ventricular wall thickness. Right ventricular systolic function is normal. Tricuspid regurgitation signal is inadequate for assessing PA pressure. Left Atrium: Left atrial size was mildly dilated. Right Atrium: Right atrial size was normal in size. Pericardium: There is no evidence of pericardial effusion. Mitral Valve: The mitral valve is grossly normal. Trivial mitral valve regurgitation. Tricuspid Valve: The tricuspid valve is grossly normal. Tricuspid  valve regurgitation is trivial. Aortic Valve: The aortic valve is tricuspid. Aortic valve regurgitation is trivial. Mild to moderate aortic valve sclerosis/calcification is present, without any evidence of aortic stenosis. Pulmonic Valve: The pulmonic valve was not well visualized. Pulmonic valve regurgitation is trivial. Aorta: The aortic root is normal in size and structure. Venous: The inferior vena cava is normal in size with less than 50% respiratory variability, suggesting right atrial pressure of 8 mmHg. IAS/Shunts: No atrial level shunt detected by color flow Doppler. Additional Comments: A device wire is visualized.  LEFT VENTRICLE PLAX 2D LVIDd:         4.11 cm LVIDs:         3.18 cm LV PW:         1.27 cm LV IVS:        1.35 cm LVOT diam:     2.20 cm LV SV:         90 LV SV Index:   56 LVOT Area:     3.80 cm  RIGHT VENTRICLE RV S prime:     13.30 cm/s TAPSE (M-mode): 1.6 cm LEFT ATRIUM             Index       RIGHT ATRIUM           Index LA diam:        3.60 cm 2.24 cm/m  RA Area:     15.70 cm LA Vol (A2C):   62.3 ml 38.75 ml/m RA Volume:   36.40 ml  22.64 ml/m LA Vol (A4C):   56.0 ml 34.83 ml/m LA Biplane Vol: 61.7 ml 38.38 ml/m  AORTIC VALVE LVOT Vmax:   103.00 cm/s LVOT Vmean:  75.100 cm/s LVOT VTI:    0.237 m  AORTA Ao Root diam: 3.40 cm  SHUNTS Systemic VTI:  0.24 m Systemic Diam: 2.20 cm Nona Dell MD Electronically signed by Nona Dell MD Signature Date/Time: 06/05/2020/12:15:35 PM    Final    CT HEAD CODE STROKE WO CONTRAST  Result Date: 06/04/2020 CLINICAL DATA:  Code stroke. Acute neuro deficit. Right-sided weakness, slurred speech. EXAM: CT HEAD WITHOUT CONTRAST TECHNIQUE: Contiguous axial images were obtained from the base of the skull through the vertex without intravenous contrast. COMPARISON:  CT head 11/20/2019 FINDINGS: Brain: Small area of acute hemorrhage in the left thalamus. Blood volume less than 1 cc. No other areas of hemorrhage Moderate atrophy. Moderate  chronic microvascular ischemic changes unchanged from the prior study. Negative for mass or midline shift. Vascular: Atherosclerotic calcification in the carotid and vertebral arteries bilaterally. Negative for hyperdense vessel Skull: Negative skull. Sinuses/Orbits: Paranasal sinuses clear. Bilateral cataract extraction Other: None ASPECTS ([place] Stroke Program Early CT Score) Not applicable due to acute hemorrhage. IMPRESSION: 1. Small focus of acute hemorrhage left thalamus. 2. Moderate atrophy and moderate chronic microvascular ischemic change. 3. These results were called by telephone at  the time of interpretation on 06/04/2020 at 7:47 pm to provider [facility] Contessa Preuss , who verbally acknowledged these results. Electronically Signed   By: Marlan Palau M.D.   On: 06/04/2020 19:49    Procedures Procedures (including critical care time)  Medications Ordered in ED Medications  clevidipine (CLEVIPREX) infusion 0.5 mg/mL (0 mg/hr Intravenous Stopped 06/05/20 1352)  hydrALAZINE (APRESOLINE) injection 5 mg (5 mg Intravenous Given 06/06/20 0325)  0.9 %  sodium chloride infusion (has no administration in time range)  methylPREDNISolone sodium succinate (SOLU-MEDROL) 125 mg/2 mL injection 125 mg (has no administration in time range)  albuterol (VENTOLIN HFA) 108 (90 Base) MCG/ACT inhaler 2 puff (has no administration in time range)  donepezil (ARICEPT) tablet 10 mg (10 mg Oral Given 06/05/20 2300)  memantine (NAMENDA) tablet 10 mg (10 mg Oral Given 06/05/20 2300)  ramipril (ALTACE) capsule 2.5 mg (2.5 mg Oral Given 06/05/20 1844)  Chlorhexidine Gluconate Cloth 2 % PADS 6 each (6 each Topical Given 06/05/20 1514)  carvedilol (COREG) tablet 6.25 mg (has no administration in time range)  hydrALAZINE (APRESOLINE) injection 5 mg (5 mg Intravenous Given 06/04/20 2018)  hydrALAZINE (APRESOLINE) injection 5 mg (5 mg Intravenous Given 06/05/20 1843)  hydrALAZINE (APRESOLINE) injection 5 mg (5 mg Intravenous Given 06/04/20  2037)  0.9 %  sodium chloride infusion (0 mLs Intravenous Stopped 06/05/20 1209)    ED Course  I have reviewed the triage vital signs and the nursing notes.  Pertinent labs & imaging results that were available during my care of the patient were reviewed by me and considered in my medical decision making (see chart for details). CRITICAL CARE Performed by: Bethann Berkshire Total critical care time:45 minutes Critical care time was exclusive of separately billable procedures and treating other patients. Critical care was necessary to treat or prevent imminent or life-threatening deterioration. Critical care was time spent personally by me on the following activities: development of treatment plan with patient and/or surrogate as well as nursing, discussions with consultants, evaluation of patient's response to treatment, examination of patient, obtaining history from patient or surrogate, ordering and performing treatments and interventions, ordering and review of laboratory studies, ordering and review of radiographic studies, pulse oximetry and re-evaluation of patient's condition.   I spoke with neurology and was felt the patient could be admitted to [facility] with tight blood pressure control.  Trying to keep systolic under 140 MDM Rules/Calculators/A&P                          Patient with cerebral hemorrhage.  She will be admitted to medicine with neurology consult      This patient presents to the ED for concern of weakness this involves an extensive number of treatment options, and is a complaint that carries with it a high risk of complications and morbidity.  The differential diagnosis includes stroke   Lab Tests:   I Ordered, reviewed, and interpreted labs, which included CBC chemistries which were unremarkable except for mild anemia  Medicines ordered:   I ordered medication hydralazine for blood pressure  Imaging Studies ordered:   I ordered imaging studies  which included CT head which  I independently visualized and interpreted imaging which showed cerebral hemorrhage  Additional history obtained:   Additional history obtained from daughter-in-law  Previous records obtained and reviewed.  Consultations Obtained:   I consulted hospitalist and neurologist and discussed lab and imaging findings  Reevaluation:  After the interventions stated above,  I reevaluated the patient and found unchanged  Critical Interventions:     Final Clinical Impression(s) / ED Diagnoses Final diagnoses:  Acute ischemic stroke [facility])    Rx / DC Orders ED Discharge Orders    None       Bethann Berkshire, MD 06/06/20 [phone]

## 2020-06-06 NOTE — TOC Progression Note (Signed)
Transition of Care [facility]) - Progression Note    Patient Details  Name: Pamela [name] MRN: [id number] Date of Birth: 08/25/1920  Transition of Care [facility]) CM/SW Contact  Barry Brunner, LCSW Phone Number: 06/06/2020, 1:36 PM  Clinical Narrative:    AP nurse informed [facility] of Covid + status at time of conversation, [name] agreeable to take patient. French Ana with [facility] admissions reported that she had not been informed by staff of patient's Covid + status and would not be able to take patient until isolation period ended. CSW notified patient's son. Patient's son agreeable to SNF placement. TOC to follow.         Expected Discharge Plan and Services           Expected Discharge Date: 06/06/20                                     Social Determinants of Health (SDOH) Interventions    Readmission Risk Interventions No flowsheet data found.

## 2020-06-07 DIAGNOSIS — I639 Cerebral infarction, unspecified: Secondary | ICD-10-CM

## 2020-06-07 LAB — RESPIRATORY PANEL BY RT PCR (FLU A&B, COVID)
Influenza A by PCR: NEGATIVE
Influenza B by PCR: NEGATIVE
SARS Coronavirus 2 by RT PCR: NEGATIVE

## 2020-06-07 MED ORDER — HALOPERIDOL LACTATE 5 MG/ML IJ SOLN: 1.0000 mg | Freq: Four times a day (QID) | INTRAMUSCULAR | Status: DC | PRN

## 2020-06-07 NOTE — Progress Notes (Signed)
Patient seen and evaluated this morning.  She had to return back to the hospital on 11/7 as her facility could not accept her back into the memory care unit with Covid isolation.  She continues to remain on isolation precautions with noted agitation into the evening hours for which Haldol has been prescribed as needed intermittently.  Blood pressures remain controlled and no other acute events noted.  She is awaiting SNF placement at this time and TOC is working on that.  Discussed extensively with son and daughter-in-law on phone.  Repeat Covid testing ordered as requested.  Please refer to discharge summary dictated 11/7 for full details.  Total care time: 30 minutes.

## 2020-06-07 NOTE — TOC Progression Note (Signed)
Transition of Care [facility]) - Progression Note    Patient Details  Name: Pamela [name] MRN: [id number] Date of Birth: [birth year]  Transition of Care [facility]) CM/SW Contact  Barry Brunner, LCSW Phone Number: 06/07/2020, 4:58 PM  Clinical Narrative:    CSW followed up with SNF facilities accepting Covid patients. [facility] reported that they have not yet been able to review patient and may be able to on 11/9. Remaining SNF's declined. CSW observed recent negative Covid Test. CSW contacted [name] to inquire about facilities ability to accept patient back. French Ana with [name] reported that the Health department's recommendation was to wait 24 hours after negative Covid test to retest patient. If the following Covid test results in an additional negative Covid test, then they would be permitted to accept patient back to facility. TOC to follow.         Expected Discharge Plan and Services                                                 Social Determinants of Health (SDOH) Interventions    Readmission Risk Interventions No flowsheet data found.

## 2020-06-08 LAB — RESPIRATORY PANEL BY RT PCR (FLU A&B, COVID)
Influenza A by PCR: NEGATIVE
Influenza B by PCR: NEGATIVE
SARS Coronavirus 2 by RT PCR: NEGATIVE

## 2020-06-08 NOTE — Progress Notes (Signed)
Patient seen and evaluated this morning with some confusion noted overnight.  She has now had 2 - Covid test and can return back to [facility].  Family members are happy to hear the news.  She is in stable condition for discharge with stable blood pressure control with medications as previously ordered.  No other acute events noted during this brief admission.  Please refer to discharge summary dictated 11/7 for full details.  Total care time: 30 minutes.

## 2020-06-08 NOTE — TOC Transition Note (Signed)
Transition of Care [facility]) - CM/SW Discharge Note   Patient Details  Name: Pamela [name] MRN: [id number] Date of Birth: [birth year]  Transition of Care [facility]) CM/SW Contact:  Annice Needy, LCSW Phone Number: 06/08/2020, 1:16 PM   Clinical Narrative:    Patient from [facility]. Second COVID test negative. Daughter, Kriste Basque, notified of discharge. Discharge clinicals sent to facility.    Final next level of care: Assisted Living Barriers to Discharge: No Barriers Identified   Patient Goals and CMS Choice        Discharge Placement                       Discharge Plan and Services                                     Social Determinants of Health (SDOH) Interventions     Readmission Risk Interventions No flowsheet data found.

## 2020-06-08 NOTE — Plan of Care (Signed)

## 2020-06-08 NOTE — NC FL2 (Signed)
[place] MEDICAID FL2 LEVEL OF CARE SCREENING TOOL     IDENTIFICATION  Patient Name: Pamela [name] Birthdate: 27-Apr-1921 Sex: female Admission Date (Current Location): 06/06/2020  [facility] and [place] Number:  [employer] and Address:  [facility],  [address], [place] [address]      Provider Number:    Attending Physician Name and Address:  Erick Blinks, DO  Relative Name and Phone Number:  Delena Bali [phone]    Current Level of Care: Hospital Recommended Level of Care: Assisted Living Facility Prior Approval Number:    Date Approved/Denied: 01/06/14 PASRR Number: [id number]  Discharge Plan: Domiciliary (Rest home)    Current Diagnoses: Patient Active Problem List   Diagnosis Date Noted  . CVA (cerebral vascular accident) (HCC) 06/05/2020  . Acute CVA (cerebrovascular accident) (HCC) 06/04/2020  . Hyperglycemia 06/04/2020  . Dehydration 06/04/2020  . Pneumothorax, left 11/13/2019  . Dementia without behavioral disturbance (HCC)   . Palliative care by specialist   . Advanced care planning/counseling discussion   . Goals of care, counseling/discussion   . Syncope 11/12/2019  . Pancytopenia (HCC) 06/26/2017  . Gait disorder 03/05/2017  . Urinary frequency 08/10/2015  . Postoperative anemia due to acute blood loss 07/27/2015  . SVT (supraventricular tachycardia) (HCC) 07/26/2015  . Femur fracture, left (HCC) 07/24/2015  . Chronic kidney disease 07/24/2015  . Periprosthetic fracture around internal prosthetic left hip joint (HCC) 07/24/2015  . Fall at home   . DVT (deep venous thrombosis) (HCC) 01/30/2014  . Occult blood positive stool 01/30/2014  . Anemia, iron deficiency 01/06/2014  . Hip fracture, left (HCC) 01/03/2014  . QT prolongation 01/03/2014  . Atherosclerotic cardiovascular disease 08/13/2013  . Hypertensive emergency 04/08/2013  . Dizzy 04/08/2013  . PAT (paroxysmal atrial tachycardia) (HCC) 12/25/2012  .  SSS (sick sinus syndrome) with PAT and marked bradycardia with 3 sec pauses  08/30/2012  . Status post placement of cardiac pacemaker. 08/29/12 [employer] Jude device 08/30/2012  . HTN (hypertension) 08/30/2012  . Hyperlipidemia 08/30/2012  . Osteoporosis, unspecified 04/19/2012  . Essential and other specified forms of tremor 04/19/2012  . Essential hypertension 04/19/2012  . Cardiac dysrhythmia 04/19/2012  . Chronic ischemic heart disease 04/19/2012  . Hereditary and idiopathic peripheral neuropathy 04/19/2012  . Memory loss 04/19/2012  . Disturbance of skin sensation 04/19/2012  . Pain in joint, shoulder region 11/20/2011  . Status post complete repair of rotator cuff 11/20/2011  . Muscle weakness (generalized) 11/20/2011    Orientation RESPIRATION BLADDER Height & Weight     Self  Normal Incontinent, External catheter Weight: 124 lb 12.5 oz (56.6 kg) Height:  5\' 5"  (165.1 cm)  BEHAVIORAL SYMPTOMS/MOOD NEUROLOGICAL BOWEL NUTRITION STATUS      Continent Diet (Diet Heart Room service appropriate? Yes; Fluid consistency: Thin)  AMBULATORY STATUS COMMUNICATION OF NEEDS Skin   Extensive Assist Verbally Other (Comment) (Pressure Wound stage 2 on buttock)                       Personal Care Assistance Level of Assistance  Bathing, Feeding, Dressing Bathing Assistance: Maximum assistance Feeding assistance: Limited assistance Dressing Assistance: Maximum assistance     Functional Limitations Info  Sight, Hearing, Speech Sight Info: Impaired Hearing Info: Adequate Speech Info: Impaired (Mildly slurred speech)    SPECIAL CARE FACTORS FREQUENCY  PT (By licensed PT)     PT Frequency: 3x/week  Contractures Contractures Info: Not present    Additional Factors Info  Code Status, Allergies Code Status Info: Full Allergies Info: Gabapentin           Current Medications (06/08/2020):  This is the current hospital active medication list Current  Facility-Administered Medications  Medication Dose Route Frequency Provider Last Rate Last Admin  . 0.9 %  sodium chloride infusion  250 mL Intravenous PRN Sherryll Burger, Pratik D, DO      . acetaminophen (TYLENOL) tablet 650 mg  650 mg Oral Q6H PRN Sherryll Burger, Pratik D, DO       Or  . acetaminophen (TYLENOL) suppository 650 mg  650 mg Rectal Q6H PRN Sherryll Burger, Pratik D, DO      . calcium-vitamin D (OSCAL WITH D) 500-200 MG-UNIT per tablet 1 tablet  1 tablet Oral Daily Sherryll Burger, Pratik D, DO   1 tablet at 06/08/20 0834  . carvedilol (COREG) tablet 6.25 mg  6.25 mg Oral BID Sherryll Burger, Pratik D, DO   6.25 mg at 06/08/20 0834  . docusate sodium (COLACE) capsule 100 mg  100 mg Oral BID Sherryll Burger, Pratik D, DO   100 mg at 06/08/20 0834  . donepezil (ARICEPT) tablet 10 mg  10 mg Oral QHS Shah, Pratik D, DO   10 mg at 06/07/20 2159  . ferrous sulfate tablet 325 mg  325 mg Oral Q breakfast Maurilio Lovely D, DO   325 mg at 06/08/20 0835  . folic acid (FOLVITE) tablet 1 mg  1 mg Oral Daily Sherryll Burger, Pratik D, DO   1 mg at 06/08/20 5638  . haloperidol lactate (HALDOL) injection 1 mg  1 mg Intravenous Q6H PRN Sherryll Burger, Pratik D, DO      . hydrALAZINE (APRESOLINE) tablet 25 mg  25 mg Oral Q8H Shah, Pratik D, DO   25 mg at 06/08/20 0553  . labetalol (NORMODYNE) injection 10 mg  10 mg Intravenous Q2H PRN Sherryll Burger, Pratik D, DO      . memantine (NAMENDA) tablet 10 mg  10 mg Oral BID Sherryll Burger, Pratik D, DO   10 mg at 06/08/20 0835  . ondansetron (ZOFRAN) tablet 4 mg  4 mg Oral Q6H PRN Sherryll Burger, Pratik D, DO       Or  . ondansetron (ZOFRAN) injection 4 mg  4 mg Intravenous Q6H PRN Sherryll Burger, Pratik D, DO      . ramipril (ALTACE) capsule 5 mg  5 mg Oral Daily Sherryll Burger, Pratik D, DO   5 mg at 06/08/20 0836  . sertraline (ZOLOFT) tablet 25 mg  25 mg Oral Daily Sherryll Burger, Pratik D, DO   25 mg at 06/08/20 0836  . sodium chloride flush (NS) 0.9 % injection 3 mL  3 mL Intravenous Q12H Shah, Pratik D, DO   3 mL at 06/07/20 2200  . sodium chloride flush (NS) 0.9 % injection 3 mL  3 mL  Intravenous PRN Sherryll Burger, Pratik D, DO         Discharge Medications: STOP taking these medications       aspirin EC 81 MG tablet             TAKE these medications       acetaminophen 500 MG tablet Commonly known as: TYLENOL Take 500 mg by mouth every 6 (six) hours as needed for mild pain or moderate pain.   calcium-vitamin D 500-200 MG-UNIT tablet Commonly known as: OSCAL WITH D Take 1 tablet by mouth daily.   carvedilol 6.25 MG tablet Commonly known as: COREG Take  1 tablet (6.25 mg total) by mouth 2 (two) times daily. What changed:   medication strength  how much to take   docusate sodium 100 MG capsule Commonly known as: Colace Take 1 capsule (100 mg total) by mouth 2 (two) times daily.   donepezil 10 MG tablet Commonly known as: ARICEPT TAKE ONE TABLET BY MOUTH EVERY NIGHT AT BEDTIME   FeroSul 325 (65 FE) MG tablet Generic drug: ferrous sulfate Take 325 mg by mouth daily.   folic acid 1 MG tablet Commonly known as: FOLVITE Take 1 mg by mouth daily.   hydrALAZINE 25 MG tablet Commonly known as: APRESOLINE TAKE ONE TABLET BY MOUTH IN THE MORNING,TWO TABLETS AT NOON, AND ONE TABLET IN THE EVENING   lactose free nutrition Liqd Take 1 Container by mouth at bedtime.   memantine 10 MG tablet Commonly known as: NAMENDA TAKE ONE TABLET (10MG  TOTAL) BY MOUTH TWO TIMES DAILY What changed: See the new instructions.   mirtazapine 15 MG tablet Commonly known as: REMERON Take 15 mg by mouth at bedtime as needed. For insomnia and/or appetite   mirtazapine 7.5 MG tablet Commonly known as: REMERON Take 7.5 mg by mouth at bedtime. (for abnormal weight loss)   OCUVITE PO Take 1 tablet by mouth 2 (two) times daily.   ramipril 5 MG capsule Commonly known as: ALTACE TAKE ONE CAPSULE (5MG  TOTAL) BY MOUTH DAILY   sertraline 25 MG tablet Commonly known as: ZOLOFT Take 25 mg by mouth daily.   VITAMIN D-3 PO Take 2,000 Units by mouth daily.     Relevant Imaging Results:  Relevant Lab Results:   Additional Information PT SSN:  , LCSW

## 2020-06-14 DIAGNOSIS — F339 Major depressive disorder, recurrent, unspecified: Secondary | ICD-10-CM | POA: Diagnosis not present

## 2020-06-17 ENCOUNTER — Other Ambulatory Visit (HOSPITAL_COMMUNITY)
Admission: RE | Admit: 2020-06-17 | Discharge: 2020-06-17 | Disposition: A | Discharge status: Home / Self Care | Payer: Medicare Other | Source: Other Acute Inpatient Hospital | Attending: Internal Medicine | Admitting: Internal Medicine

## 2020-06-17 DIAGNOSIS — N390 Urinary tract infection, site not specified: Secondary | ICD-10-CM | POA: Diagnosis present

## 2020-06-17 LAB — URINALYSIS, ROUTINE W REFLEX MICROSCOPIC
Bilirubin Urine: NEGATIVE
Glucose, UA: NEGATIVE mg/dL
Ketones, ur: NEGATIVE mg/dL
Leukocytes,Ua: NEGATIVE
Nitrite: NEGATIVE
Protein, ur: NEGATIVE mg/dL
Specific Gravity, Urine: 1.011 (ref 1.005–1.030)
pH: 7 (ref 5.0–8.0)

## 2020-06-18 LAB — URINE CULTURE

## 2020-07-12 ENCOUNTER — Ambulatory Visit: Payer: Medicare Other

## 2020-07-13 LAB — CUP PACEART REMOTE DEVICE CHECK
Battery Remaining Longevity: 121 mo
Battery Remaining Percentage: 91 %
Battery Voltage: 2.95 V
Brady Statistic AP VP Percent: 1 %
Brady Statistic AP VS Percent: 8.6 %
Brady Statistic AS VP Percent: 1 %
Brady Statistic AS VS Percent: 91 %
Brady Statistic RA Percent Paced: 7.9 %
Brady Statistic RV Percent Paced: 1 %
Date Time Interrogation Session: 20211213030558
Implantable Lead Implant Date: 20140414
Implantable Lead Implant Date: 20140414
Implantable Lead Location: 753859
Implantable Lead Location: 753860
Implantable Pulse Generator Implant Date: 20140414
Lead Channel Impedance Value: 350 Ohm
Lead Channel Impedance Value: 410 Ohm
Lead Channel Pacing Threshold Amplitude: 0.5 V
Lead Channel Pacing Threshold Amplitude: 0.75 V
Lead Channel Pacing Threshold Pulse Width: 0.4 ms
Lead Channel Pacing Threshold Pulse Width: 0.4 ms
Lead Channel Sensing Intrinsic Amplitude: 1.5 mV
Lead Channel Sensing Intrinsic Amplitude: 12 mV
Lead Channel Setting Pacing Amplitude: 1 V
Lead Channel Setting Pacing Amplitude: 1.5 V
Lead Channel Setting Pacing Pulse Width: 0.4 ms
Lead Channel Setting Sensing Sensitivity: 2 mV
Pulse Gen Model: 2210
Pulse Gen Serial Number: 7437302

## 2020-07-20 ENCOUNTER — Encounter (HOSPITAL_COMMUNITY): Payer: Self-pay | Admitting: Emergency Medicine

## 2020-07-20 ENCOUNTER — Emergency Department (HOSPITAL_COMMUNITY)

## 2020-07-20 ENCOUNTER — Observation Stay (HOSPITAL_COMMUNITY)
Admission: EM | Admit: 2020-07-20 | Discharge: 2020-07-21 | Disposition: A | Discharge status: Home / Self Care | Attending: Internal Medicine | Admitting: Internal Medicine

## 2020-07-20 ENCOUNTER — Other Ambulatory Visit: Payer: Self-pay

## 2020-07-20 DIAGNOSIS — I161 Hypertensive emergency: Secondary | ICD-10-CM | POA: Diagnosis not present

## 2020-07-20 DIAGNOSIS — Z96642 Presence of left artificial hip joint: Secondary | ICD-10-CM | POA: Diagnosis not present

## 2020-07-20 DIAGNOSIS — I619 Nontraumatic intracerebral hemorrhage, unspecified: Principal | ICD-10-CM | POA: Insufficient documentation

## 2020-07-20 DIAGNOSIS — Z950 Presence of cardiac pacemaker: Secondary | ICD-10-CM | POA: Insufficient documentation

## 2020-07-20 DIAGNOSIS — I610 Nontraumatic intracerebral hemorrhage in hemisphere, subcortical: Secondary | ICD-10-CM

## 2020-07-20 DIAGNOSIS — Z79899 Other long term (current) drug therapy: Secondary | ICD-10-CM | POA: Insufficient documentation

## 2020-07-20 DIAGNOSIS — I129 Hypertensive chronic kidney disease with stage 1 through stage 4 chronic kidney disease, or unspecified chronic kidney disease: Secondary | ICD-10-CM | POA: Diagnosis not present

## 2020-07-20 DIAGNOSIS — I2510 Atherosclerotic heart disease of native coronary artery without angina pectoris: Secondary | ICD-10-CM | POA: Diagnosis not present

## 2020-07-20 DIAGNOSIS — Z20822 Contact with and (suspected) exposure to covid-19: Secondary | ICD-10-CM | POA: Insufficient documentation

## 2020-07-20 DIAGNOSIS — N189 Chronic kidney disease, unspecified: Secondary | ICD-10-CM | POA: Insufficient documentation

## 2020-07-20 DIAGNOSIS — F0390 Unspecified dementia without behavioral disturbance: Secondary | ICD-10-CM | POA: Insufficient documentation

## 2020-07-20 DIAGNOSIS — Z7982 Long term (current) use of aspirin: Secondary | ICD-10-CM | POA: Insufficient documentation

## 2020-07-20 LAB — COMPREHENSIVE METABOLIC PANEL
ALT: 9 U/L (ref 0–44)
AST: 19 U/L (ref 15–41)
Albumin: 4.2 g/dL (ref 3.5–5.0)
Alkaline Phosphatase: 74 U/L (ref 38–126)
Anion gap: 10 (ref 5–15)
BUN: 25 mg/dL — ABNORMAL HIGH (ref 8–23)
CO2: 25 mmol/L (ref 22–32)
Calcium: 9.7 mg/dL (ref 8.9–10.3)
Chloride: 104 mmol/L (ref 98–111)
Creatinine, Ser: 0.81 mg/dL (ref 0.44–1.00)
GFR, Estimated: >60 mL/min (ref >60)
Glucose, Bld: 150 mg/dL — ABNORMAL HIGH (ref 70–99)
Potassium: 4 mmol/L (ref 3.5–5.1)
Sodium: 139 mmol/L (ref 135–145)
Total Bilirubin: 0.4 mg/dL (ref 0.3–1.2)
Total Protein: 6.5 g/dL (ref 6.5–8.1)

## 2020-07-20 LAB — PROTIME-INR
INR: 1.1 (ref 0.8–1.2)
Prothrombin Time: 13.6 seconds (ref 11.4–15.2)

## 2020-07-20 LAB — CBC
HCT: 35.4 % — ABNORMAL LOW (ref 36.0–46.0)
Hemoglobin: 11.3 g/dL — ABNORMAL LOW (ref 12.0–15.0)
MCH: 30.6 pg (ref 26.0–34.0)
MCHC: 31.9 g/dL (ref 30.0–36.0)
MCV: 95.9 fL (ref 80.0–100.0)
Platelets: 157 10*3/uL (ref 150–400)
RBC: 3.69 MIL/uL — ABNORMAL LOW (ref 3.87–5.11)
RDW: 13.9 % (ref 11.5–15.5)
WBC: 5.2 10*3/uL (ref 4.0–10.5)
nRBC: 0 % (ref 0.0–0.2)

## 2020-07-20 LAB — ETHANOL: Alcohol, Ethyl (B): <10 mg/dL (ref <10)

## 2020-07-20 LAB — DIFFERENTIAL
Abs Immature Granulocytes: 0.01 10*3/uL (ref 0.00–0.07)
Basophils Absolute: 0 10*3/uL (ref 0.0–0.1)
Basophils Relative: 0 %
Eosinophils Absolute: 0.1 10*3/uL (ref 0.0–0.5)
Eosinophils Relative: 2 %
Immature Granulocytes: 0 %
Lymphocytes Relative: 59 %
Lymphs Abs: 3 10*3/uL (ref 0.7–4.0)
Monocytes Absolute: 0.5 10*3/uL (ref 0.1–1.0)
Monocytes Relative: 10 %
Neutro Abs: 1.5 10*3/uL — ABNORMAL LOW (ref 1.7–7.7)
Neutrophils Relative %: 29 %

## 2020-07-20 LAB — RESP PANEL BY RT-PCR (FLU A&B, COVID) ARPGX2
Influenza A by PCR: NEGATIVE
Influenza B by PCR: NEGATIVE
SARS Coronavirus 2 by RT PCR: NEGATIVE

## 2020-07-20 LAB — APTT: aPTT: 26 seconds (ref 24–36)

## 2020-07-20 LAB — CBG MONITORING, ED: Glucose-Capillary: 140 mg/dL — ABNORMAL HIGH (ref 70–99)

## 2020-07-20 MED ORDER — ONDANSETRON HCL 4 MG/2ML IJ SOLN
INTRAMUSCULAR | Status: AC
  Filled 2020-07-20: qty 2

## 2020-07-20 MED ORDER — CLEVIDIPINE BUTYRATE 0.5 MG/ML IV EMUL
0.0000 mg/h | INTRAVENOUS | Status: DC
  Administered 2020-07-20: 1 mg/h via INTRAVENOUS
  Administered 2020-07-20 (×2): 21 mg/h via INTRAVENOUS
  Filled 2020-07-20: qty 50

## 2020-07-20 MED ORDER — ONDANSETRON HCL 4 MG/2ML IJ SOLN
4.0000 mg | Freq: Once | INTRAMUSCULAR | Status: AC
  Administered 2020-07-20: 4 mg via INTRAVENOUS
  Filled 2020-07-20: qty 2

## 2020-07-20 NOTE — ED Notes (Signed)
MD at bedside, pt has agonal respirations, pt placed on comfort care, son at bedside.

## 2020-07-20 NOTE — Progress Notes (Signed)
CODE STROKE TIME DOCUMENTATION  CALL TIME - 18:15 EXAM STARTED - 18:26 EXAM FINISHED - 18:28 IMAGES SENT TO SOC - 18:30 EXAM COMPLETED IN Epic - 18:30 GRA CALLED - [id number]

## 2020-07-20 NOTE — Consult Note (Signed)
[facility] Telemedicine Consult   Requesting Provider: Particia Nearing Consult Participants: Nurse, Particia Nearing, Son Location of the provider: [facility] Location of the patient: Pamela [name]  This consult was provided via telemedicine with 2-way video and audio communication. The family was informed that care would be provided in this way and agreed to receive care in this manner.    Chief Complaint: Unresponsive, left gaze deviation  HPI: 86 year old female with recent admission in November of 2021 for left thalamic hemorrhage with resultant right sided weakness.  Was st facility today and noted to become unresponsive with what was described as left gaze deviation and right sided neglect.  Patient brought in for evaluation.    LKW: 1730 tpa given?: No, ICH IR Thrombectomy? No, ICH Modified Rankin Scale:2 Time of teleneurologist evaluation: 1832  ICH Score: 5       Exam: Vitals:   July 27, 2020 1820  Pulse: 81  SpO2: 96%    General: Unresponsive  1A: Level of Consciousness - 3 1B: Ask Month and Age - 2 1C: 'Blink Eyes' & 'Squeeze Hands' - 2 2: Test Horizontal Extraocular Movements - 2 3: Test Visual Fields - 3 4: Test Facial Palsy - 1 5A: Test Left Arm Motor Drift - 4 5B: Test Right Arm Motor Drift - 4 6A: Test Left Leg Motor Drift - 4 6B: Test Right Leg Motor Drift - 4 7: Test Limb Ataxia - 0 8: Test Sensation - 2 9: Test Language/Aphasia- 3 10: Test Dysarthria - 2 11: Test Extinction/Inattention - 0 NIHSS score: 36   Imaging Reviewed: Head CT without contrast personally reviewed and shows a large left basal ganglia hemorrhage with surrounding vasogenic edema, subarrachnoid penetration of blood and intraventricular extension of blood.  51mm left to right midline shift.    Labs reviewed in epic and pertinent values follow: CBG 140   Assessment/Plan: 86 year old female with recent history of left thalamic infarct presenting with altered mental status and forced left gaze  deviation.  Head CT shows large left basal ganglia hemorrhage with surrounding edema, midline shift and intraventricular extension.  Prognosis poor with very high mortality (close to 100%) with ICH score of 5.  Poor prognosis discussed with son.  Patient currently under hospice.     This patient is receiving care for acute neurological changes. There was 32 minutes of care by this provider at the time of service, including time for direct evaluation via telemedicine, review of medical records, imaging studies and discussion of findings with providers and family.  Thana Farr, MD Neurology   If 7pm- 7am, please page neurology on call as listed in AMION.

## 2020-07-20 NOTE — ED Notes (Signed)
Son at bedside, talking with Neuro MD

## 2020-07-20 NOTE — ED Notes (Signed)
Pt in bed, pt on 4L via [place], pt desatted to 87, talked with md, plan to increase to 6L and stay with [place] only, pt desatting, md paged, pt satting 60 to 55% on 6L, pt has agonal respirations, charge RN talked with MD and md will return to eval pt.

## 2020-07-20 NOTE — ED Notes (Signed)
Pt pronounced by Feliz Beam, RN and Sheralyn Boatman, RN.

## 2020-07-20 NOTE — ED Triage Notes (Signed)
Pt from [facility]. Per facility pt was her normal and ate diner around 1700. Per facility pt was last seen normal at 1730 tonight. Upon arrival pt had left sided gaze and complete right sided neglect. Dr. Particia Nearing at the bedside.

## 2020-07-20 NOTE — H&P (Signed)
History and Physical    Pamela Sackreva N Micalizzi NWG:[id number]:[id number] DOB: 05-14-1921 DOA: 08-07-2019  [place]   Patient coming from: [facility]  I have personally briefly reviewed patient's old medical records [facility]  Chief Complaint: Right sided neglect.  HPI: Pamela [name] is a 86 y.o. female with medical history significant for hypertension, sick sinus syndrome, with ICD and pacemaker status, dementia, DVT, CKD. Patient was brought to the ED unresponsive from nursing home.  Patient was found in the bathroom, with left gaze deviation and right-sided neglect.  Patient was last seen normal at about 5:30 PM. At the time of my evaluation, patient is unresponsive, patient son is at bedside.  Patient's son reports that of her recent hospitalization for stroke, patient was back to her baseline, without significant deficits, she was able to hold conversations, she was able to ambulate with a wheelchair which was her baseline.  Patient is under hospice care.  Recent hospitalization and discharge 06/06/2020 for capsular intracranial hemorrhage secondary to hypertension, patient had small deficits which appear to have resolved.  ED Course: Heart rate 81, blood pressure 225/208, O2 sats 96% on room air, unremarkable CBC, CMP.  Head CT-large intraparenchymal hematoma with the epicenter in the left basal ganglia measuring 6.2 x 3.8 x 4.2. Tele- Neurology was consulted-poor prognosis with very high mortality close to 100%, ICH score 5. EDP had several conversations with patient's son, patient was made DNR.  Patient son was concerned about patient's blood pressure, so Cleviprex was started in the ED.  Review of Systems: Unable to ascertain due to severe stroke.  Past Medical History:  Diagnosis Date  . Coronary artery disease    stent in the LAD artery in 2002  . Gait disorder 03/05/2017  . HTN (hypertension) 08/30/2012  . Hyperlipidemia 08/30/2012  . ICD (implantable  cardiac defibrillator) in place   . Macular degeneration   . Pacemaker 08/29/2012   st jude accent DR RF device, model number [id number], serial number [id number], implanted 08/29/2012 for sinus bradycardia, runs of supraventricular tachycardia  last checked 10/23/2012  . Polyneuropathy in other diseases classified elsewhere (HCC)   . Shingles 08/2016  . SSS (sick sinus syndrome) with PAT and marked bradycardia with 3 sec pauses  08/30/2012  . Status post placement of cardiac pacemaker. 08/29/12 St. Jude device 08/30/2012  . Venous insufficiency     Past Surgical History:  Procedure Laterality Date  . ABDOMINAL HYSTERECTOMY    . basal cell  06/2017   R side of face.  Marland Kitchen. CARDIAC CATHETERIZATION  2006   3 stents placed  . CARDIAC CATHETERIZATION  06/2001   placement of BiodivYsio 2.5.10mm stent dilated to 2.5775mm in proximal left anterior descenting stenotic lesion  . CESAREAN SECTION    . COLONOSCOPY W/ POLYPECTOMY    . HIP ARTHROPLASTY Left 01/04/2014   Procedure: ARTHROPLASTY BIPOLAR HIP;  Surgeon: Shelda PalMatthew D Olin, MD;  [facility];  Service: Orthopedics;  Laterality: Left;  . INSERT / REPLACE / REMOVE PACEMAKER  08/29/2012   st jude   . KIDNEY STONE SURGERY    . OPEN REDUCTION INTERNAL FIXATION (ORIF) DISTAL RADIAL FRACTURE Right 04/01/2016   Procedure: OPEN REDUCTION INTERNAL FIXATION (ORIF) RIGHT DISTAL RADIAL FRACTURE AND REPAIR;  Surgeon: Bradly BienenstockFred Ortmann, MD;  Location: [facility] OR;  Service: Orthopedics;  Laterality: Right;  . PERMANENT PACEMAKER INSERTION N/A 08/29/2012   Procedure: PERMANENT PACEMAKER INSERTION;  Surgeon: Thurmon FairMihai Croitoru, MD;  Location: [facility] CATH LAB;  Service: Cardiovascular;  Laterality: N/A;  . RADIOFREQUENCY ABLATION  08/25/2010   ablation of left greater saphenous vein  . ROTATOR CUFF REPAIR Left   . ROTATOR CUFF REPAIR Right   . TOTAL HIP REVISION Left 07/24/2015   Procedure: ORIF LEFT PERIPROSTHETIC FEMUR FRACTURE; CONVERSION TO TOTAL HIP ARTHROPLASTY POSS. FEMORAL COMPONENT  REVISION ;  Surgeon: Samson Frederic, MD;  Location: [facility] OR;  Service: Orthopedics;  Laterality: Left;     reports that she has never smoked. She has never used smokeless tobacco. She reports that she does not drink alcohol and does not use drugs.  Allergies  Allergen Reactions  . Gabapentin Other (See Comments)    Blurred vision, dizziness     Family History  Problem Relation Age of Onset  . Heart disease Mother   . Congestive Heart Failure Mother   . Heart disease Father   . Heart attack Father   . Heart disease Brother      Prior to Admission medications   Medication Sig Start Date End Date Taking? Authorizing Provider  acetaminophen (TYLENOL) 500 MG tablet Take 500 mg by mouth every 6 (six) hours as needed for mild pain or moderate pain.     [provider]  aspirin 81 MG EC tablet Take 81 mg by mouth daily. Swallow whole.    [provider]  calcium-vitamin D (OSCAL WITH D) 500-200 MG-UNIT tablet Take 1 tablet by mouth daily.    [provider]  carvedilol (COREG) 6.25 MG tablet Take 1 tablet (6.25 mg total) by mouth 2 (two) times daily. 06/06/20 07/06/20  Sherryll Burger, Pratik D, DO  Cholecalciferol (VITAMIN D-3 PO) Take 2,000 Units by mouth daily.     [provider]  docusate sodium (COLACE) 100 MG capsule Take 1 capsule (100 mg total) by mouth 2 (two) times daily. 04/01/16   Bradly Bienenstock, MD  donepezil (ARICEPT) 10 MG tablet TAKE ONE TABLET BY MOUTH EVERY NIGHT AT BEDTIME 10/16/18   York Spaniel, MD  FEROSUL 325 (65 Fe) MG tablet Take 325 mg by mouth daily. 03/01/20   [provider]  hydrALAZINE (APRESOLINE) 25 MG tablet TAKE ONE TABLET BY MOUTH IN THE MORNING,TWO TABLETS AT NOON, AND ONE TABLET IN THE EVENING Patient taking differently: Take 25 mg by mouth See admin instructions. TAKE ONE TABLET BY MOUTH IN THE MORNING,TWO TABLETS AT NOON, AND ONE TABLET IN THE EVENING 07/29/19   Marinus Maw, MD  lactose free nutrition (BOOST) LIQD  Take 1 Container by mouth at bedtime. 05/18/20   [provider]  memantine (NAMENDA) 10 MG tablet TAKE ONE TABLET (10MG  TOTAL) BY MOUTH TWO TIMES DAILY Patient taking differently: Take 10 mg by mouth 2 (two) times daily.  07/17/19   07/19/19, NP  mirtazapine (REMERON) 15 MG tablet Take 15 mg by mouth at bedtime as needed. For insomnia and/or appetite 06/03/20   [provider]  mirtazapine (REMERON) 7.5 MG tablet Take 7.5 mg by mouth at bedtime. (for abnormal weight loss) 06/03/20   [provider]  ramipril (ALTACE) 5 MG capsule TAKE ONE CAPSULE (5MG  TOTAL) BY MOUTH DAILY Patient not taking: Reported on 06/04/2020 12/03/18   13/11/2019, MD  sertraline (ZOLOFT) 25 MG tablet Take 25 mg by mouth daily. Patient not taking: Reported on 06/04/2020    [provider]    Physical Exam: Vitals:   08-06-20 2115 08-06-2020 2120 08/06/20 2125 08/06/20 2130  BP: 111/62 110/62 105/62 (!) 109/59  Pulse: 76 78 81  80  Resp: (!) 25 (!) 23 (!) 24 (!) 24  Temp:      TempSrc:      SpO2: 94% 93% 93% 93%    Constitutional: Unresponsive Vitals:   07/02/2020 2115 07/24/2020 2120 07/27/2020 2125 07/13/2020 2130  BP: 111/62 110/62 105/62 (!) 109/59  Pulse: 76 78 81 80  Resp: (!) 25 (!) 23 (!) 24 (!) 24  Temp:      TempSrc:      SpO2: 94% 93% 93% 93%   Eyes: Pupils equal round. ENMT: Mucous membranes moist. Neck:no masses, no thyromegaly Respiratory: Normal respiratory effort. No accessory muscle use.  Cardiovascular: Regular rate and rhythm, . No extremity edema.   Abdomen: Unresponsive, no tenderness,  Musculoskeletal: no clubbing / cyanosis. Skin: no rashes, lesions, ulcers. No induration Neurologic: Unresponsive. Psychiatric: Unresponsive  Labs on Admission: I have personally reviewed following labs and imaging studies  CBC: Recent Labs  Lab 07/01/2020 1819  WBC 5.2  NEUTROABS 1.5*  HGB 11.3*  HCT 35.4*  MCV 95.9  PLT 157   Basic Metabolic Panel: Recent  Labs  Lab 07/17/2020 1819  NA 139  K 4.0  CL 104  CO2 25  GLUCOSE 150*  BUN 25*  CREATININE 0.81  CALCIUM 9.7   Liver Function Tests: Recent Labs  Lab 07/25/2020 1819  AST 19  ALT 9  ALKPHOS 74  BILITOT 0.4  PROT 6.5  ALBUMIN 4.2   Coagulation Profile: Recent Labs  Lab 07/08/2020 1819  INR 1.1   CBG: Recent Labs  Lab 07/19/2020 1820  GLUCAP 140*    Radiological Exams on Admission: DG Chest Portable 1 View  Result Date: 07/02/2020 CLINICAL DATA:  CVA symptoms, intracranial hemorrhage, last known well at 17:30 EXAM: PORTABLE CHEST 1 VIEW COMPARISON:  11/14/2019 FINDINGS: Single frontal view of the chest demonstrates stable dual lead pacemaker. The cardiac silhouette is unremarkable. No acute airspace disease, effusion, or pneumothorax. IMPRESSION: 1. No acute intrathoracic process. Electronically Signed   By: Sharlet Salina M.D.   On: 07/13/2020 19:32   CT HEAD CODE STROKE WO CONTRAST  Result Date: 07/24/2020 CLINICAL DATA:  Code stroke.  Right-sided weakness in the collect EXAM: CT HEAD WITHOUT CONTRAST TECHNIQUE: Contiguous axial images were obtained from the base of the skull through the vertex without intravenous contrast. COMPARISON:  06/05/2020 FINDINGS: Brain: There is a large intraparenchymal hematoma with the epicenter in the left basal ganglia measuring 6.2 x 3.8 x 4.2 cm (volume = 52 cm^3). This is probably early subacute as there is surrounding vasogenic edema. There is subarachnoid penetration with blood present within both sylvian fissures and at the base of the brain. Atrophy and extensive chronic small-vessel ischemic changes seen elsewhere. Some intraventricular blood within the left lateral ventricle which is compressed. Left right midline shift at 7 mm. Vascular: There is atherosclerotic calcification of the major vessels at the base of the brain. Skull: Negative Sinuses/Orbits: Clear/normal Other: None IMPRESSION: Large intraparenchymal hematoma with the  epicenter in the left basal ganglia measuring 6.2 x 3.8 x 4.2 cm. This is probably early subacute as there is surrounding vasogenic edema. Subarachnoid penetration with blood present within both Sylvian fissures and at the base of the brain. Some intraventricular blood within the left lateral ventricle which is compressed. Left-to-right midline shift maximal 7 mm. These results were called by telephone at the time of interpretation on 07/23/2020 at 6:46 pm to provider [facility] , who verbally acknowledged these results. Electronically Signed   By: Loraine Leriche  Shogry M.D.   On: 07/12/2020 18:47   Assessment/Plan Active Problems:   Hemorrhagic stroke (HCC)   LARGE Hemorrhagic stroke/comfort care-patient is unresponsive, head CT shows large intraparenchymal hematoma with defibrillator in the left basal ganglia measuring 6.2 x 3.8 x 4.2 cm.  With early subacute vasogenic edema. Teleneurology consulted-poor prognosis with close to 100% mortality. -Patient made comfort care.  Onnie Boer MD [facility]  07/02/2020, 11:14 PM

## 2020-07-20 NOTE — ED Provider Notes (Signed)
[facility] EMERGENCY DEPARTMENT Provider Note   CSN: [id number] Arrival date & time: 21-Jul-2020  1811  An emergency department physician performed an initial assessment on this suspected stroke patient at 1815.  History Chief Complaint  Patient presents with   Code Stroke    Pamela [name] is a 86 y.o. female.  Pt presents to the ED today with AMS.  Pt ate dinner tonight at 1700.  She was LSN at 1730.  She was found in the bathroom covered in feces and vomit.  She was not responding.  Pt has left sided gaze and right sided neglect.  Code stroke called upon arrival.          Past Medical History:  Diagnosis Date   Coronary artery disease    stent in the LAD artery in 2002   Gait disorder 03/05/2017   HTN (hypertension) 08/30/2012   Hyperlipidemia 08/30/2012   ICD (implantable cardiac defibrillator) in place    Macular degeneration    Pacemaker 08/29/2012   st jude accent DR RF device, model number [id number], serial number [id number], implanted 08/29/2012 for sinus bradycardia, runs of supraventricular tachycardia  last checked 10/23/2012   Polyneuropathy in other diseases classified elsewhere [facility])    Shingles 08/2016   SSS (sick sinus syndrome) with PAT and marked bradycardia with 3 sec pauses  08/30/2012   Status post placement of cardiac pacemaker. 08/29/12 St. Jude device 08/30/2012   Venous insufficiency     Patient Active Problem List   Diagnosis Date Noted   CVA (cerebral vascular accident) (HCC) 06/05/2020   Acute CVA (cerebrovascular accident) (HCC) 06/04/2020   Hyperglycemia 06/04/2020   Dehydration 06/04/2020   Pneumothorax, left 11/13/2019   Dementia without behavioral disturbance (HCC)    Palliative care by specialist    Advanced care planning/counseling discussion    Goals of care, counseling/discussion    Syncope 11/12/2019   Pancytopenia (HCC) 06/26/2017   Gait disorder 03/05/2017   Urinary frequency 08/10/2015   Postoperative  anemia due to acute blood loss 07/27/2015   SVT (supraventricular tachycardia) (HCC) 07/26/2015   Femur fracture, left (HCC) 07/24/2015   Chronic kidney disease 07/24/2015   Periprosthetic fracture around internal prosthetic left hip joint (HCC) 07/24/2015   Fall at home    DVT (deep venous thrombosis) (HCC) 01/30/2014   Occult blood positive stool 01/30/2014   Anemia, iron deficiency 01/06/2014   Hip fracture, left (HCC) 01/03/2014   QT prolongation 01/03/2014   Atherosclerotic cardiovascular disease 08/13/2013   Hypertensive emergency 04/08/2013   Dizzy 04/08/2013   PAT (paroxysmal atrial tachycardia) (HCC) 12/25/2012   SSS (sick sinus syndrome) with PAT and marked bradycardia with 3 sec pauses  08/30/2012   Status post placement of cardiac pacemaker. 08/29/12 St. Jude device 08/30/2012   HTN (hypertension) 08/30/2012   Hyperlipidemia 08/30/2012   Osteoporosis, unspecified 04/19/2012   Essential and other specified forms of tremor 04/19/2012   Essential hypertension 04/19/2012   Cardiac dysrhythmia 04/19/2012   Chronic ischemic heart disease 04/19/2012   Hereditary and idiopathic peripheral neuropathy 04/19/2012   Memory loss 04/19/2012   Disturbance of skin sensation 04/19/2012   Pain in joint, shoulder region 11/20/2011   Status post complete repair of rotator cuff 11/20/2011   Muscle weakness (generalized) 11/20/2011    Past Surgical History:  Procedure Laterality Date   ABDOMINAL HYSTERECTOMY     basal cell  06/2017   R side of face.   CARDIAC CATHETERIZATION  2006   3 stents placed   CARDIAC  CATHETERIZATION  06/2001   placement of BiodivYsio 2.5.10mm stent dilated to 2.32mm in proximal left anterior descenting stenotic lesion   CESAREAN SECTION     COLONOSCOPY W/ POLYPECTOMY     HIP ARTHROPLASTY Left 01/04/2014   Procedure: ARTHROPLASTY BIPOLAR HIP;  Surgeon: Shelda Pal, MD;  Location: [facility] OR;  Service: Orthopedics;  Laterality:  Left;   INSERT / REPLACE / REMOVE PACEMAKER  08/29/2012   st jude    KIDNEY STONE SURGERY     OPEN REDUCTION INTERNAL FIXATION (ORIF) DISTAL RADIAL FRACTURE Right 04/01/2016   Procedure: OPEN REDUCTION INTERNAL FIXATION (ORIF) RIGHT DISTAL RADIAL FRACTURE AND REPAIR;  Surgeon: Bradly Bienenstock, MD;  Location: [facility] OR;  Service: Orthopedics;  Laterality: Right;   PERMANENT PACEMAKER INSERTION N/A 08/29/2012   Procedure: PERMANENT PACEMAKER INSERTION;  Surgeon: Thurmon Fair, MD;  Location: [facility] CATH LAB;  Service: Cardiovascular;  Laterality: N/A;   RADIOFREQUENCY ABLATION  08/25/2010   ablation of left greater saphenous vein   ROTATOR CUFF REPAIR Left    ROTATOR CUFF REPAIR Right    TOTAL HIP REVISION Left 07/24/2015   Procedure: ORIF LEFT PERIPROSTHETIC FEMUR FRACTURE; CONVERSION TO TOTAL HIP ARTHROPLASTY POSS. FEMORAL COMPONENT REVISION ;  Surgeon: Samson Frederic, MD;  Location: [facility] OR;  Service: Orthopedics;  Laterality: Left;     OB History   No obstetric history on file.     Family History  Problem Relation Age of Onset   Heart disease Mother    Congestive Heart Failure Mother    Heart disease Father    Heart attack Father    Heart disease Brother     Social History   Tobacco Use   Smoking status: Never Smoker   Smokeless tobacco: Never Used  Vaping Use   Vaping Use: Never used  Substance Use Topics   Alcohol use: No    Alcohol/week: 0.0 standard drinks   Drug use: No    Home Medications Prior to Admission medications   Medication Sig Start Date End Date Taking? Authorizing Provider  acetaminophen (TYLENOL) 500 MG tablet Take 500 mg by mouth every 6 (six) hours as needed for mild pain or moderate pain.     [provider]  aspirin 81 MG EC tablet Take 81 mg by mouth daily. Swallow whole.    [provider]  calcium-vitamin D (OSCAL WITH D) 500-200 MG-UNIT tablet Take 1 tablet by mouth daily.    [provider]  carvedilol (COREG)  6.25 MG tablet Take 1 tablet (6.25 mg total) by mouth 2 (two) times daily. 06/06/20 07/06/20  Sherryll Burger, Pratik D, DO  Cholecalciferol (VITAMIN D-3 PO) Take 2,000 Units by mouth daily.     [provider]  docusate sodium (COLACE) 100 MG capsule Take 1 capsule (100 mg total) by mouth 2 (two) times daily. 04/01/16   Bradly Bienenstock, MD  donepezil (ARICEPT) 10 MG tablet TAKE ONE TABLET BY MOUTH EVERY NIGHT AT BEDTIME 10/16/18   York Spaniel, MD  FEROSUL 325 (65 Fe) MG tablet Take 325 mg by mouth daily. 03/01/20   [provider]  hydrALAZINE (APRESOLINE) 25 MG tablet TAKE ONE TABLET BY MOUTH IN THE MORNING,TWO TABLETS AT NOON, AND ONE TABLET IN THE EVENING Patient taking differently: Take 25 mg by mouth See admin instructions. TAKE ONE TABLET BY MOUTH IN THE MORNING,TWO TABLETS AT NOON, AND ONE TABLET IN THE EVENING 07/29/19   Marinus Maw, MD  lactose free nutrition (BOOST) LIQD Take 1 Container by mouth at  bedtime. 05/18/20   [provider]  memantine (NAMENDA) 10 MG tablet TAKE ONE TABLET (10MG  TOTAL) BY MOUTH TWO TIMES DAILY Patient taking differently: Take 10 mg by mouth 2 (two) times daily.  07/17/19   07/19/19, NP  mirtazapine (REMERON) 15 MG tablet Take 15 mg by mouth at bedtime as needed. For insomnia and/or appetite 06/03/20   [provider]  mirtazapine (REMERON) 7.5 MG tablet Take 7.5 mg by mouth at bedtime. (for abnormal weight loss) 06/03/20   [provider]  ramipril (ALTACE) 5 MG capsule TAKE ONE CAPSULE (5MG  TOTAL) BY MOUTH DAILY Patient not taking: Reported on 06/04/2020 12/03/18   13/11/2019, MD  sertraline (ZOLOFT) 25 MG tablet Take 25 mg by mouth daily. Patient not taking: Reported on 06/04/2020    [provider]    Allergies    Gabapentin  Review of Systems   Review of Systems  Unable to perform ROS: Mental status change    Physical Exam Updated Vital Signs BP (!) 109/59    Pulse 80    Temp (!) 97.5 F (36.4 C)  (Oral)    Resp (!) 24    SpO2 93%   Physical Exam Vitals and nursing note reviewed.  Constitutional:      General: She is in acute distress.  HENT:     Head:     Comments: Gaze to left    Right Ear: External ear normal.     Left Ear: External ear normal.     Nose: Nose normal.     Mouth/Throat:     Mouth: Mucous membranes are dry.  Eyes:     Pupils: Pupils are equal, round, and reactive to light.  Cardiovascular:     Rate and Rhythm: Normal rate and regular rhythm.     Pulses: Normal pulses.     Heart sounds: Normal heart sounds.  Pulmonary:     Breath sounds: Rhonchi present.  Abdominal:     General: Abdomen is flat. Bowel sounds are normal.     Palpations: Abdomen is soft.  Musculoskeletal:        General: No signs of injury.     Cervical back: Normal range of motion and neck supple.  Skin:    General: Skin is warm.     Capillary Refill: Capillary refill takes less than 2 seconds.  Neurological:     Comments: Pt's eyes do not deviate past midline.  She is not responding to any painful stimuli on either side.     ED Results / Procedures / Treatments   Labs (all labs ordered are listed, but only abnormal results are displayed) Labs Reviewed  CBC - Abnormal; Notable for the following components:      Result Value   RBC 3.69 (*)    Hemoglobin 11.3 (*)    HCT 35.4 (*)    All other components within normal limits  DIFFERENTIAL - Abnormal; Notable for the following components:   Neutro Abs 1.5 (*)    All other components within normal limits  COMPREHENSIVE METABOLIC PANEL - Abnormal; Notable for the following components:   Glucose, Bld 150 (*)    BUN 25 (*)    All other components within normal limits  CBG MONITORING, ED - Abnormal; Notable for the following components:   Glucose-Capillary 140 (*)    All other components within normal limits  RESP PANEL BY RT-PCR (FLU A&B, COVID) ARPGX2  ETHANOL  PROTIME-INR  APTT  RAPID URINE DRUG  SCREEN, HOSP PERFORMED   URINALYSIS, ROUTINE W REFLEX MICROSCOPIC  I-STAT CHEM 8, ED    EKG None  Radiology DG Chest Portable 1 View  Result Date: 07/29/2020 CLINICAL DATA:  CVA symptoms, intracranial hemorrhage, last known well at 17:30 EXAM: PORTABLE CHEST 1 VIEW COMPARISON:  11/14/2019 FINDINGS: Single frontal view of the chest demonstrates stable dual lead pacemaker. The cardiac silhouette is unremarkable. No acute airspace disease, effusion, or pneumothorax. IMPRESSION: 1. No acute intrathoracic process. Electronically Signed   By: Sharlet Salina M.D.   On: 07/05/2020 19:32   CT HEAD CODE STROKE WO CONTRAST  Result Date: 07/30/2020 CLINICAL DATA:  Code stroke.  Right-sided weakness in the collect EXAM: CT HEAD WITHOUT CONTRAST TECHNIQUE: Contiguous axial images were obtained from the base of the skull through the vertex without intravenous contrast. COMPARISON:  06/05/2020 FINDINGS: Brain: There is a large intraparenchymal hematoma with the epicenter in the left basal ganglia measuring 6.2 x 3.8 x 4.2 cm (volume = 52 cm^3). This is probably early subacute as there is surrounding vasogenic edema. There is subarachnoid penetration with blood present within both sylvian fissures and at the base of the brain. Atrophy and extensive chronic small-vessel ischemic changes seen elsewhere. Some intraventricular blood within the left lateral ventricle which is compressed. Left right midline shift at 7 mm. Vascular: There is atherosclerotic calcification of the major vessels at the base of the brain. Skull: Negative Sinuses/Orbits: Clear/normal Other: None IMPRESSION: Large intraparenchymal hematoma with the epicenter in the left basal ganglia measuring 6.2 x 3.8 x 4.2 cm. This is probably early subacute as there is surrounding vasogenic edema. Subarachnoid penetration with blood present within both Sylvian fissures and at the base of the brain. Some intraventricular blood within the left lateral ventricle which is compressed.  Left-to-right midline shift maximal 7 mm. These results were called by telephone at the time of interpretation on 07/29/2020 at 6:46 pm to provider [facility] , who verbally acknowledged these results. Electronically Signed   By: Paulina Fusi M.D.   On: 07/04/2020 18:47    Procedures Procedures (including critical care time)  Medications Ordered in ED Medications  clevidipine (CLEVIPREX) infusion 0.5 mg/mL (21 mg/hr Intravenous New Bag/Given 07/01/2020 2101)  ondansetron (ZOFRAN) injection 4 mg (4 mg Intravenous Given 07/07/2020 1846)    ED Course  I have reviewed the triage vital signs and the nursing notes.  Pertinent labs & imaging results that were available during my care of the patient were reviewed by me and considered in my medical decision making (see chart for details).    MDM Rules/Calculators/A&P                             Unfortunately, pt has a very large hemorrhagic stroke.  I recommended comfort care to the son as did the neurologist.  The pt's son is not ready to accept this and wants her bp treated.  He is concerned that we have not yet done this.  I showed him the CT and tried to explain that treating her bp will not help this bleed.  I also spoke to him about intubation as she is a GCS of 3.  He wants to hold off on that and see what treating her bp will do.  We treated patient with Cleviprex.  BP did respond.  Pt is unchanged.  After many conversations with pt's son by both myself and the nurse; he has finally decided that  he does not want pt intubated.  He does not want CPR.  He [facility] at [facility]   Pt d/w Dr. Mariea ClontsEmokpae ([facility]) for admission.  CRITICAL CARE Performed by: Jacalyn LefevreJulie Danyale Ridinger   Total critical care time: 60 minutes  Critical care time was exclusive of separately billable procedures and treating other patients.  Critical care was necessary to treat or prevent imminent or life-threatening deterioration.  Critical care was  time spent personally by me on the following activities: development of treatment plan with patient and/or surrogate as well as nursing, discussions with consultants, evaluation of patient's response to treatment, examination of patient, obtaining history from patient or surrogate, ordering and performing treatments and interventions, ordering and review of laboratory studies, ordering and review of radiographic studies, pulse oximetry and re-evaluation of patient's condition.  Final Clinical Impression(s) / ED Diagnoses Final diagnoses:  Hemorrhagic stroke [facility](HCC)  Hypertensive emergency    Rx / DC Orders ED Discharge Orders    None       Jacalyn LefevreHaviland, Gregor Dershem, MD 07/09/2020 2136

## 2020-07-28 ENCOUNTER — Encounter: Payer: Medicare Other | Admitting: Internal Medicine

## 2020-07-31 NOTE — Death Summary Note (Signed)
Death Summary  Pamela [name] OFB:[id number] DOB: 1920-11-22 DOA: 2020/07/24  PCP: [facility]  Admit date: July 24, 2020 Date of Death: 07/24/20 Time of Death: 10.33pm Notification: [place], [facility] notified of death of 07/24/20   History of present illness:  Pamela [name] is a 87 y.o. female with medical history significant for hypertension, sick sinus syndrome, with ICD and pacemaker status, dementia, DVT, CKD. Patient was brought to the ED unresponsive from nursing home.  Patient was found in the bathroom, with left gaze deviation and right-sided neglect.  Patient was last seen normal at about 5:30 PM. At the time of my evaluation, patient is unresponsive, patient son is at bedside.  Patient's son reports that of her recent hospitalization for stroke, patient was back to her baseline, without significant deficits, she was able to hold conversations, she was able to ambulate with a wheelchair which was her baseline.  Patient is under hospice care.  Recent hospitalization and discharge 06/06/2020 for capsular intracranial hemorrhage secondary to hypertension, patient had small deficits which appear to have resolved.  ED Course: Heart rate 81, blood pressure 225/208, O2 sats 96% on room air, unremarkable CBC, CMP.  Head CT-large intraparenchymal hematoma with the epicenter in the left basal ganglia measuring 6.2 x 3.8 x 4.2. Tele- Neurology was consulted-poor prognosis with very high mortality close to 100%, ICH score 5. EDP had several conversations with patient's son, patient was made DNR.  Patient son was concerned about patient's blood pressure, so Cleviprex was started in the ED.  Final Diagnoses:   LARGE Hemorrhagic stroke/comfort care-patient arrived unresponsive, patient is unresponsive, head CT shows large intraparenchymal hematoma with defibrillator in the left basal ganglia measuring 6.2 x 3.8 x 4.2 cm.  With early subacute vasogenic edema. Teleneurology  consulted-poor prognosis with close to 100% mortality.  Cleviprex drip started for elevated blood pressure as requested by patient's son.  Patient's son agreed to DNR status and comfort care.  Shortly after my evaluation patient became apneic and pulseless.  Pacemaker/ICD turned off.  Time of death 10:33 PM . Patient passed peacefully, with patient's son present at bedside.  Son was present at bedside.   The results of significant diagnostics from this hospitalization (including imaging, microbiology, ancillary and laboratory) are listed below for reference.    Significant Diagnostic Studies: DG Chest Portable 1 View  Result Date: 07/24/20 CLINICAL DATA:  CVA symptoms, intracranial hemorrhage, last known well at 17:30 EXAM: PORTABLE CHEST 1 VIEW COMPARISON:  11/14/2019 FINDINGS: Single frontal view of the chest demonstrates stable dual lead pacemaker. The cardiac silhouette is unremarkable. No acute airspace disease, effusion, or pneumothorax. IMPRESSION: 1. No acute intrathoracic process. Electronically Signed   By: Sharlet Salina M.D.   On: 07/24/2020 19:32   CUP PACEART REMOTE DEVICE CHECK  Result Date: 07/13/2020 Scheduled remote reviewed. Normal device function.  AMS episodes < 30 minutes HVR episodes periods of AF with RVR with atrial undersensing noted Next remote 91 days.  CT HEAD CODE STROKE WO CONTRAST  Result Date: 07/24/20 CLINICAL DATA:  Code stroke.  Right-sided weakness in the collect EXAM: CT HEAD WITHOUT CONTRAST TECHNIQUE: Contiguous axial images were obtained from the base of the skull through the vertex without intravenous contrast. COMPARISON:  06/05/2020 FINDINGS: Brain: There is a large intraparenchymal hematoma with the epicenter in the left basal ganglia measuring 6.2 x 3.8 x 4.2 cm (volume = 52 cm^3). This is probably early subacute as there is surrounding vasogenic edema. There is subarachnoid penetration with blood  present within both sylvian fissures and at the  base of the brain. Atrophy and extensive chronic small-vessel ischemic changes seen elsewhere. Some intraventricular blood within the left lateral ventricle which is compressed. Left right midline shift at 7 mm. Vascular: There is atherosclerotic calcification of the major vessels at the base of the brain. Skull: Negative Sinuses/Orbits: Clear/normal Other: None IMPRESSION: Large intraparenchymal hematoma with the epicenter in the left basal ganglia measuring 6.2 x 3.8 x 4.2 cm. This is probably early subacute as there is surrounding vasogenic edema. Subarachnoid penetration with blood present within both Sylvian fissures and at the base of the brain. Some intraventricular blood within the left lateral ventricle which is compressed. Left-to-right midline shift maximal 7 mm. These results were called by telephone at the time of interpretation on 07/03/2020 at 6:46 pm to provider [facility] , who verbally acknowledged these results. Electronically Signed   By: Paulina Fusi M.D.   On: 07/15/2020 18:47    Microbiology: Recent Results (from the past 240 hour(s))  Resp Panel by RT-PCR (Flu A&B, Covid) Nasopharyngeal Swab     Status: None   Collection Time: 07/24/2020  9:11 PM   Specimen: Nasopharyngeal Swab; Nasopharyngeal(NP) swabs in vial transport medium  Result Value Ref Range Status   SARS Coronavirus 2 by RT PCR NEGATIVE NEGATIVE Final    Comment: (NOTE) SARS-CoV-2 target nucleic acids are NOT DETECTED.  The SARS-CoV-2 RNA is generally detectable in upper respiratory specimens during the acute phase of infection. The lowest concentration of SARS-CoV-2 viral copies this assay can detect is 138 copies/mL. A negative result does not preclude SARS-Cov-2 infection and should not be used as the sole basis for treatment or other patient management decisions. A negative result may occur with  improper specimen collection/handling, submission of specimen other than nasopharyngeal swab, presence of viral  mutation(s) within the areas targeted by this assay, and inadequate number of viral copies(<138 copies/mL). A negative result must be combined with clinical observations, patient history, and epidemiological information. The expected result is Negative.  Fact Sheet for Patients:  BloggerCourse.com  Fact Sheet for Healthcare Providers:  SeriousBroker.it  This test is no t yet approved or cleared by the [place] FDA and  has been authorized for detection and/or diagnosis of SARS-CoV-2 by FDA under an Emergency Use Authorization (EUA). This EUA will remain  in effect (meaning this test can be used) for the duration of the COVID-19 declaration under Section 564(b)(1) of the Act, 21 U.S.C.section 360bbb-3(b)(1), unless the authorization is terminated  or revoked sooner.       Influenza A by PCR NEGATIVE NEGATIVE Final   Influenza B by PCR NEGATIVE NEGATIVE Final    Comment: (NOTE) The Xpert Xpress SARS-CoV-2/FLU/RSV plus assay is intended as an aid in the diagnosis of influenza from Nasopharyngeal swab specimens and should not be used as a sole basis for treatment. Nasal washings and aspirates are unacceptable for Xpert Xpress SARS-CoV-2/FLU/RSV testing.  Fact Sheet for Patients: BloggerCourse.com  Fact Sheet for Healthcare Providers: SeriousBroker.it  This test is not yet approved or cleared by the [place] FDA and has been authorized for detection and/or diagnosis of SARS-CoV-2 by FDA under an Emergency Use Authorization (EUA). This EUA will remain in effect (meaning this test can be used) for the duration of the COVID-19 declaration under Section 564(b)(1) of the Act, 21 U.S.C. section 360bbb-3(b)(1), unless the authorization is terminated or revoked.  Performed at [facility], [address], [place], [place] [address]  Labs: Basic Metabolic Panel: Recent  Labs  Lab 07/18/2020 1819  NA 139  K 4.0  CL 104  CO2 25  GLUCOSE 150*  BUN 25*  CREATININE 0.81  CALCIUM 9.7   Liver Function Tests: Recent Labs  Lab 07/19/2020 1819  AST 19  ALT 9  ALKPHOS 74  BILITOT 0.4  PROT 6.5  ALBUMIN 4.2   CBC: Recent Labs  Lab 07/15/2020 1819  WBC 5.2  NEUTROABS 1.5*  HGB 11.3*  HCT 35.4*  MCV 95.9  PLT 157   CBG: Recent Labs  Lab 07/13/2020 1820  GLUCAP 140*    SIGNED:  Onnie Boer, MD  [facility] 07/13/2020, 11:38 PM

## 2020-07-31 DEATH — deceased

## 2021-01-11 ENCOUNTER — Other Ambulatory Visit: Payer: Self-pay
